# Patient Record
Sex: Female | Born: 1955 | Race: White | Hispanic: No | Smoking: Never smoker
Health system: Southern US, Community
[De-identification: ages and names within clinical notes are randomized; demographics above are authoritative.]

## PROBLEM LIST (undated history)

## (undated) DIAGNOSIS — F419 Anxiety disorder, unspecified: Secondary | ICD-10-CM

## (undated) DIAGNOSIS — Z950 Presence of cardiac pacemaker: Secondary | ICD-10-CM

## (undated) DIAGNOSIS — M1990 Unspecified osteoarthritis, unspecified site: Secondary | ICD-10-CM

## (undated) DIAGNOSIS — K219 Gastro-esophageal reflux disease without esophagitis: Secondary | ICD-10-CM

## (undated) DIAGNOSIS — F32A Depression, unspecified: Secondary | ICD-10-CM

## (undated) DIAGNOSIS — I5022 Chronic systolic (congestive) heart failure: Secondary | ICD-10-CM

## (undated) DIAGNOSIS — E119 Type 2 diabetes mellitus without complications: Secondary | ICD-10-CM

## (undated) DIAGNOSIS — T7840XA Allergy, unspecified, initial encounter: Secondary | ICD-10-CM

## (undated) DIAGNOSIS — R9431 Abnormal electrocardiogram [ECG] [EKG]: Secondary | ICD-10-CM

## (undated) DIAGNOSIS — G5760 Lesion of plantar nerve, unspecified lower limb: Secondary | ICD-10-CM

## (undated) DIAGNOSIS — R55 Syncope and collapse: Secondary | ICD-10-CM

## (undated) DIAGNOSIS — I10 Essential (primary) hypertension: Secondary | ICD-10-CM

## (undated) DIAGNOSIS — G44219 Episodic tension-type headache, not intractable: Secondary | ICD-10-CM

## (undated) DIAGNOSIS — Z8679 Personal history of other diseases of the circulatory system: Secondary | ICD-10-CM

## (undated) DIAGNOSIS — M797 Fibromyalgia: Secondary | ICD-10-CM

## (undated) DIAGNOSIS — E7800 Pure hypercholesterolemia, unspecified: Secondary | ICD-10-CM

## (undated) DIAGNOSIS — Q249 Congenital malformation of heart, unspecified: Secondary | ICD-10-CM

## (undated) DIAGNOSIS — E785 Hyperlipidemia, unspecified: Secondary | ICD-10-CM

## (undated) DIAGNOSIS — Z9889 Other specified postprocedural states: Secondary | ICD-10-CM

## (undated) DIAGNOSIS — G43909 Migraine, unspecified, not intractable, without status migrainosus: Secondary | ICD-10-CM

## (undated) DIAGNOSIS — F329 Major depressive disorder, single episode, unspecified: Secondary | ICD-10-CM

## (undated) DIAGNOSIS — B019 Varicella without complication: Secondary | ICD-10-CM

## (undated) HISTORY — DX: Type 2 diabetes mellitus without complications: E11.9

## (undated) HISTORY — DX: Depression, unspecified: F32.A

## (undated) HISTORY — DX: Gastro-esophageal reflux disease without esophagitis: K21.9

## (undated) HISTORY — DX: Hyperlipidemia, unspecified: E78.5

## (undated) HISTORY — DX: Pure hypercholesterolemia, unspecified: E78.00

## (undated) HISTORY — DX: Unspecified osteoarthritis, unspecified site: M19.90

## (undated) HISTORY — DX: Episodic tension-type headache, not intractable: G44.219

## (undated) HISTORY — DX: Allergy, unspecified, initial encounter: T78.40XA

## (undated) HISTORY — DX: Congenital malformation of heart, unspecified: Q24.9

## (undated) HISTORY — DX: Presence of cardiac pacemaker: Z95.0

## (undated) HISTORY — DX: Fibromyalgia: M79.7

## (undated) HISTORY — DX: Anxiety disorder, unspecified: F41.9

## (undated) HISTORY — DX: Varicella without complication: B01.9

## (undated) HISTORY — DX: Abnormal electrocardiogram (ECG) (EKG): R94.31

## (undated) HISTORY — DX: Personal history of other diseases of the circulatory system: Z86.79

## (undated) HISTORY — DX: Essential (primary) hypertension: I10

## (undated) HISTORY — DX: Lesion of plantar nerve, unspecified lower limb: G57.60

## (undated) HISTORY — DX: Migraine, unspecified, not intractable, without status migrainosus: G43.909

## (undated) HISTORY — DX: Other specified postprocedural states: Z98.890

## (undated) HISTORY — DX: Major depressive disorder, single episode, unspecified: F32.9

---
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
## 2003-04-22 HISTORY — PX: HYSTEROSCOPY: SHX211

## 2003-11-28 ENCOUNTER — Other Ambulatory Visit: Payer: Self-pay

## 2005-03-06 ENCOUNTER — Ambulatory Visit: Payer: Self-pay | Admitting: Unknown Physician Specialty

## 2005-03-18 ENCOUNTER — Ambulatory Visit: Payer: Self-pay | Admitting: Unknown Physician Specialty

## 2006-06-23 ENCOUNTER — Ambulatory Visit: Payer: Self-pay | Admitting: Unknown Physician Specialty

## 2006-07-15 ENCOUNTER — Emergency Department (HOSPITAL_COMMUNITY): Admission: EM | Admit: 2006-07-15 | Discharge: 2006-07-15 | Payer: Self-pay | Admitting: Emergency Medicine

## 2006-07-19 ENCOUNTER — Emergency Department (HOSPITAL_COMMUNITY): Admission: EM | Admit: 2006-07-19 | Discharge: 2006-07-19 | Payer: Self-pay | Admitting: Emergency Medicine

## 2006-09-22 ENCOUNTER — Ambulatory Visit (HOSPITAL_BASED_OUTPATIENT_CLINIC_OR_DEPARTMENT_OTHER): Admission: RE | Admit: 2006-09-22 | Discharge: 2006-09-22 | Payer: Self-pay | Admitting: Orthopaedic Surgery

## 2007-03-22 HISTORY — PX: SPINAL FUSION: SHX223

## 2008-01-31 ENCOUNTER — Ambulatory Visit: Payer: Self-pay | Admitting: Unknown Physician Specialty

## 2008-02-07 ENCOUNTER — Ambulatory Visit: Payer: Self-pay | Admitting: Unknown Physician Specialty

## 2009-01-19 ENCOUNTER — Ambulatory Visit: Payer: Self-pay | Admitting: Internal Medicine

## 2009-02-15 ENCOUNTER — Ambulatory Visit: Payer: Self-pay | Admitting: Unknown Physician Specialty

## 2009-02-19 ENCOUNTER — Ambulatory Visit: Payer: Self-pay | Admitting: Neurosurgery

## 2009-03-01 ENCOUNTER — Encounter: Admission: RE | Admit: 2009-03-01 | Discharge: 2009-04-03 | Payer: Self-pay | Admitting: Neurosurgery

## 2009-10-17 ENCOUNTER — Ambulatory Visit: Payer: Self-pay | Admitting: Pain Medicine

## 2009-10-31 ENCOUNTER — Ambulatory Visit: Payer: Self-pay | Admitting: Pain Medicine

## 2009-11-07 ENCOUNTER — Ambulatory Visit: Payer: Self-pay | Admitting: Pain Medicine

## 2009-11-14 ENCOUNTER — Ambulatory Visit: Payer: Self-pay | Admitting: Pain Medicine

## 2009-12-06 ENCOUNTER — Ambulatory Visit: Payer: Self-pay | Admitting: Pain Medicine

## 2009-12-12 ENCOUNTER — Ambulatory Visit: Payer: Self-pay | Admitting: Pain Medicine

## 2010-01-15 ENCOUNTER — Ambulatory Visit: Payer: Self-pay | Admitting: Pain Medicine

## 2010-01-23 ENCOUNTER — Ambulatory Visit: Payer: Self-pay | Admitting: Pain Medicine

## 2010-02-06 ENCOUNTER — Ambulatory Visit: Payer: Self-pay | Admitting: Pain Medicine

## 2010-02-27 ENCOUNTER — Ambulatory Visit: Payer: Self-pay | Admitting: Unknown Physician Specialty

## 2010-03-28 ENCOUNTER — Ambulatory Visit: Payer: Self-pay | Admitting: Pain Medicine

## 2010-05-14 ENCOUNTER — Ambulatory Visit: Payer: Self-pay | Admitting: Pain Medicine

## 2010-05-22 ENCOUNTER — Ambulatory Visit: Payer: Self-pay | Admitting: Pain Medicine

## 2010-06-11 ENCOUNTER — Ambulatory Visit: Payer: Self-pay | Admitting: Internal Medicine

## 2010-06-13 ENCOUNTER — Ambulatory Visit: Payer: Self-pay | Admitting: Pain Medicine

## 2010-07-01 ENCOUNTER — Ambulatory Visit: Payer: Self-pay | Admitting: Pain Medicine

## 2010-07-08 ENCOUNTER — Ambulatory Visit: Payer: Self-pay | Admitting: Pain Medicine

## 2010-08-06 ENCOUNTER — Ambulatory Visit: Payer: Self-pay | Admitting: Pain Medicine

## 2010-08-21 ENCOUNTER — Ambulatory Visit: Payer: Self-pay | Admitting: Pain Medicine

## 2010-09-03 NOTE — Op Note (Signed)
NAME:  Julia Pierce, Julia Pierce NO.:  [id number]   MEDICAL RECORD NO.:  [id number]          PATIENT TYPE:  AMB   LOCATION:  DSC                          FACILITY:  [facility]   PHYSICIAN:  Sharolyn Douglas, M.D.        DATE OF BIRTH:  1955/11/15   DATE OF PROCEDURE:  09/22/2006  DATE OF DISCHARGE:                               OPERATIVE REPORT   DIAGNOSES:  1. Lumbar spondylosis  2. Lumbar degenerative disk disease.  3. Back and bilateral lower extremity pain left greater than right.   PROCEDURE:  1. Bilateral L5-S1 facet joint injections.  2. Left L5-S1 transforaminal epidural steroid injection.  3. Fluoroscopic imaging used for needle placement of the above      injections.   SURGEON:  Sharolyn Douglas, M.D.   ASSISTANT:  None.   ANESTHESIA:  MAC plus local.   COMPLICATIONS:  None.   ESTIMATED BLOOD LOSS:  None.   INDICATIONS:  The patient is a pleasant 55 year old female with  persistent back and bilateral lower extremity pain.  This is thought to  possibly be related to lumbar spondylosis at L5-S1.  She now presents  for diagnostic and potentially therapeutic injections as listed above.  Risk, benefits and alternatives were reviewed.   PROCEDURE:  After informed consent she was taken to the operating room.  She was turned prone.  She underwent sedation by Anesthesia.  Back  prepped and draped in usual sterile fashion.  Fluoroscopy brought into  the field and fluoroscopic imaging was used to visualize the L5-S1 facet  joints bilaterally.  22 gauge Quincke spinal needles were advanced into  the facet joints bilaterally at L5-S1.  Aspiration showed no blood.  40  mg of Depo-Medrol and 1 cc preservative-free 1% lidocaine injected into  each facet joint bilaterally at L5-S1.   We then turned our attention to performing a left L5-S1 transforaminal  epidural steroid injection.  Oblique imaging was obtained of the L5-S1  segment.  22 gauge Quincke spinal needle was advanced in a  posterior  oblique fashion underneath the left L5 pedicle.  Aspiration showed no  blood or CSF.  Injection of 1 cc Omnipaque showed epidural  spread.  We then injected a solution of 80 mg Depo-Medrol and 4 cc 1%  preservative-free lidocaine.  The patient tolerated the procedure well.  There were no apparent complications.  Transferred to recovery in stable  condition.  I reviewed post injection instructions with her.  She will  follow-up in 2-3 weeks.      Sharolyn Douglas, M.D.  Electronically Signed     MC/MEDQ  D:  09/22/2006  T:  09/22/2006  Job:  [id number]

## 2010-09-05 ENCOUNTER — Ambulatory Visit: Payer: Self-pay | Admitting: Pain Medicine

## 2010-10-15 ENCOUNTER — Ambulatory Visit: Payer: Self-pay | Admitting: Pain Medicine

## 2010-11-12 ENCOUNTER — Ambulatory Visit: Payer: Self-pay | Admitting: Pain Medicine

## 2010-11-20 DIAGNOSIS — R9431 Abnormal electrocardiogram [ECG] [EKG]: Secondary | ICD-10-CM

## 2010-11-20 HISTORY — DX: Abnormal electrocardiogram (ECG) (EKG): R94.31

## 2010-12-10 ENCOUNTER — Ambulatory Visit: Payer: Self-pay | Admitting: Pain Medicine

## 2010-12-21 DIAGNOSIS — Z9889 Other specified postprocedural states: Secondary | ICD-10-CM

## 2010-12-21 HISTORY — DX: Other specified postprocedural states: Z98.890

## 2010-12-31 LAB — HM COLONOSCOPY: HM Colonoscopy: NORMAL

## 2011-01-07 ENCOUNTER — Ambulatory Visit: Payer: Self-pay | Admitting: Pain Medicine

## 2011-01-09 ENCOUNTER — Ambulatory Visit: Payer: Self-pay | Admitting: Gastroenterology

## 2011-01-11 ENCOUNTER — Other Ambulatory Visit: Payer: Self-pay | Admitting: Internal Medicine

## 2011-01-22 ENCOUNTER — Ambulatory Visit: Payer: Self-pay | Admitting: Pain Medicine

## 2011-01-30 LAB — HM MAMMOGRAPHY: HM Mammogram: NORMAL

## 2011-01-30 LAB — HM PAP SMEAR: HM Pap smear: NORMAL

## 2011-02-05 ENCOUNTER — Encounter: Payer: Self-pay | Admitting: Internal Medicine

## 2011-02-05 ENCOUNTER — Ambulatory Visit (INDEPENDENT_AMBULATORY_CARE_PROVIDER_SITE_OTHER): Payer: BC Managed Care – PPO | Admitting: Internal Medicine

## 2011-02-05 VITALS — BP 126/70 | HR 60 | Temp 97.9°F | Resp 12 | Ht 65.0 in | Wt 174.0 lb

## 2011-02-05 DIAGNOSIS — I10 Essential (primary) hypertension: Secondary | ICD-10-CM | POA: Insufficient documentation

## 2011-02-05 DIAGNOSIS — I4581 Long QT syndrome: Secondary | ICD-10-CM | POA: Insufficient documentation

## 2011-02-05 DIAGNOSIS — I499 Cardiac arrhythmia, unspecified: Secondary | ICD-10-CM

## 2011-02-05 DIAGNOSIS — F32A Depression, unspecified: Secondary | ICD-10-CM | POA: Insufficient documentation

## 2011-02-05 DIAGNOSIS — F329 Major depressive disorder, single episode, unspecified: Secondary | ICD-10-CM | POA: Insufficient documentation

## 2011-02-05 DIAGNOSIS — R9431 Abnormal electrocardiogram [ECG] [EKG]: Secondary | ICD-10-CM | POA: Insufficient documentation

## 2011-02-05 MED ORDER — METOPROLOL TARTRATE 50 MG PO TABS: 50.0000 mg | ORAL_TABLET | Freq: Two times a day (BID) | ORAL | Status: DC

## 2011-02-05 NOTE — Assessment & Plan Note (Signed)
Will schedule for sleep study per Dr. Laurena Bering .  continue metoprolol.

## 2011-02-05 NOTE — Progress Notes (Signed)
  Subjective:    Patient ID: Julia Pierce, female    DOB: 12/01/1955, 55 y.o.   MRN: [id number]  HPI 55 yo white female with history of hypertension, overweight, chronic neck and back pain receiving [facility]  Presents for followup on hypertension,  and hyperlipidemia. She recently underwent evaluation for long QT syndrome by Dr. Gwen Pounds with a Holter monitor and has been told she needs a sleep study. Past Medical History  Diagnosis Date  . Arthritis   . Chicken pox   . Headache, frequent episodic tension-type   . Cardiac arrhythmia due to congenital heart disease   . History of high blood pressure     readings  . High cholesterol   . Migraines   . Hx of colonoscopy sept 2012    normal,  next due 2022, [name]  . Hypertension   . Depression   . Holter monitor, abnormal August 2012    done for long QT.  some contractile asynchrony Gwen Pounds)   Current Outpatient Prescriptions on File Prior to Visit  Medication Sig Dispense Refill  . losartan-hydrochlorothiazide (HYZAAR) 100-25 MG per tablet TAKE 1 TABLET BY MOUTH DAILY  90 tablet  2  . methocarbamol (ROBAXIN) 500 MG tablet TAKE 1 TABLET BY MOUTH TWICE DAILY AS NEEDED FOR SPASM  180 tablet  2    Review of Systems  Constitutional: Negative for fever, chills and unexpected weight change.  HENT: Negative for hearing loss, ear pain, nosebleeds, congestion, sore throat, facial swelling, rhinorrhea, sneezing, mouth sores, trouble swallowing, neck pain, neck stiffness, voice change, postnasal drip, sinus pressure, tinnitus and ear discharge.   Eyes: Negative for pain, discharge, redness and visual disturbance.  Respiratory: Negative for cough, chest tightness, shortness of breath, wheezing and stridor.   Cardiovascular: Negative for chest pain, palpitations and leg swelling.  Musculoskeletal: Negative for myalgias and arthralgias.  Skin: Negative for color change and rash.  Neurological: Negative for  dizziness, weakness, light-headedness and headaches.  Hematological: Negative for adenopathy.       Objective:   Physical Exam  Constitutional: She is oriented to person, place, and time. She appears well-developed and well-nourished.  HENT:  Mouth/Throat: Oropharynx is clear and moist.  Eyes: EOM are normal. Pupils are equal, round, and reactive to light. No scleral icterus.  Neck: Normal range of motion. Neck supple. No JVD present. No thyromegaly present.  Cardiovascular: Normal rate, regular rhythm, normal heart sounds and intact distal pulses.   Pulmonary/Chest: Effort normal and breath sounds normal.  Abdominal: Soft. Bowel sounds are normal. She exhibits no mass. There is no tenderness.  Musculoskeletal: Normal range of motion. She exhibits no edema.  Lymphadenopathy:    She has no cervical adenopathy.  Neurological: She is alert and oriented to person, place, and time.  Skin: Skin is warm and dry.  Psychiatric: She has a normal mood and affect.          Assessment & Plan:

## 2011-02-05 NOTE — Assessment & Plan Note (Signed)
Currently controlled on metoprolol and losartan.

## 2011-02-06 ENCOUNTER — Ambulatory Visit: Payer: Self-pay | Admitting: Pain Medicine

## 2011-02-06 LAB — POCT HEMOGLOBIN-HEMACUE: Operator id: 123881

## 2011-02-20 ENCOUNTER — Encounter: Payer: Self-pay | Admitting: Internal Medicine

## 2011-02-25 ENCOUNTER — Ambulatory Visit: Payer: Self-pay | Admitting: Internal Medicine

## 2011-03-04 ENCOUNTER — Ambulatory Visit: Payer: Self-pay | Admitting: Pain Medicine

## 2011-03-04 ENCOUNTER — Ambulatory Visit: Payer: Self-pay | Admitting: Unknown Physician Specialty

## 2011-03-05 ENCOUNTER — Telehealth: Payer: Self-pay | Admitting: Internal Medicine

## 2011-03-05 NOTE — Telephone Encounter (Signed)
Notified patient of results 

## 2011-03-05 NOTE — Telephone Encounter (Signed)
Sleep study was negative for apnea,  But lots of snoring noted

## 2011-03-12 ENCOUNTER — Encounter: Payer: Self-pay | Admitting: Internal Medicine

## 2011-03-26 ENCOUNTER — Telehealth: Payer: Self-pay | Admitting: *Deleted

## 2011-03-26 ENCOUNTER — Other Ambulatory Visit (INDEPENDENT_AMBULATORY_CARE_PROVIDER_SITE_OTHER): Payer: BC Managed Care – PPO | Admitting: *Deleted

## 2011-03-26 DIAGNOSIS — E785 Hyperlipidemia, unspecified: Secondary | ICD-10-CM

## 2011-03-26 DIAGNOSIS — I10 Essential (primary) hypertension: Secondary | ICD-10-CM

## 2011-03-26 LAB — LIPID PANEL
HDL: 43.7 mg/dL (ref >39.00)
Total CHOL/HDL Ratio: 5
Triglycerides: 252 mg/dL — ABNORMAL HIGH (ref 0.0–149.0)

## 2011-03-26 LAB — COMPREHENSIVE METABOLIC PANEL
ALT: 16 U/L (ref 0–35)
Albumin: 3.9 g/dL (ref 3.5–5.2)
Alkaline Phosphatase: 64 U/L (ref 39–117)
Glucose, Bld: 111 mg/dL — ABNORMAL HIGH (ref 70–99)
Potassium: 4.1 mEq/L (ref 3.5–5.1)
Sodium: 140 mEq/L (ref 135–145)
Total Bilirubin: 0.3 mg/dL (ref 0.3–1.2)
Total Protein: 6.8 g/dL (ref 6.0–8.3)

## 2011-03-26 NOTE — Telephone Encounter (Signed)
Labs ordered.

## 2011-03-26 NOTE — Telephone Encounter (Signed)
Patient is on labs schedule for fasting labs. What labs would you like for me to order?

## 2011-03-26 NOTE — Telephone Encounter (Signed)
Fasting lipids,  CMEt,  Magnesium, TSH

## 2011-03-31 ENCOUNTER — Encounter: Payer: Self-pay | Admitting: Internal Medicine

## 2011-03-31 ENCOUNTER — Ambulatory Visit (INDEPENDENT_AMBULATORY_CARE_PROVIDER_SITE_OTHER): Payer: BC Managed Care – PPO | Admitting: Internal Medicine

## 2011-03-31 VITALS — BP 132/76 | HR 70 | Temp 98.2°F | Resp 16 | Ht 63.0 in | Wt 176.8 lb

## 2011-03-31 DIAGNOSIS — R739 Hyperglycemia, unspecified: Secondary | ICD-10-CM | POA: Insufficient documentation

## 2011-03-31 DIAGNOSIS — E663 Overweight: Secondary | ICD-10-CM

## 2011-03-31 DIAGNOSIS — R10811 Right upper quadrant abdominal tenderness: Secondary | ICD-10-CM

## 2011-03-31 DIAGNOSIS — F32A Depression, unspecified: Secondary | ICD-10-CM

## 2011-03-31 DIAGNOSIS — E785 Hyperlipidemia, unspecified: Secondary | ICD-10-CM

## 2011-03-31 DIAGNOSIS — R7309 Other abnormal glucose: Secondary | ICD-10-CM

## 2011-03-31 DIAGNOSIS — I429 Cardiomyopathy, unspecified: Secondary | ICD-10-CM | POA: Insufficient documentation

## 2011-03-31 DIAGNOSIS — I428 Other cardiomyopathies: Secondary | ICD-10-CM | POA: Insufficient documentation

## 2011-03-31 DIAGNOSIS — F329 Major depressive disorder, single episode, unspecified: Secondary | ICD-10-CM

## 2011-03-31 MED ORDER — SERTRALINE HCL 50 MG PO TABS: 100.0000 mg | ORAL_TABLET | Freq: Every day | ORAL | Status: DC

## 2011-03-31 NOTE — Assessment & Plan Note (Signed)
Spent 10 minutes discussing   lo glycemic index diet and need for exercise.  Return in 6 months with hga1c ordered.

## 2011-03-31 NOTE — Patient Instructions (Addendum)
Read about low glycemic index diets,  The Mediterranean Diet; these will both address the prediabetic stage and the high triglycerides  And subsequently ,  Your LDL.   I encourage you to eat 6 smaller deals daily,  And your goal should be 5 days of aerobic exercise 20 minutes per session.  Consider the Medifast Diet or going back to Weight watchers.  Losing 10% of your body weight will result in improved cholesterol profile.

## 2011-03-31 NOTE — Assessment & Plan Note (Signed)
She recently increased her dose to 75 mg daily and wonders if she could increase to 100 mg for persistent lack of motivation and anhedonia.  Recommended trial of 100 mg daily

## 2011-03-31 NOTE — Progress Notes (Signed)
Subjective:    Patient ID: Julia Pierce, female    DOB: September 30, 1955, 55 y.o.   MRN: [id number]  HPI  Julia Pierce is a 55 yr old white female with chronic neck and back pain , obesity and hyperlipidemia presents for 6 month followup.   She had sleep study done recently to rule out sleep apnea because of an abnormal ECHO showing systolic dysfunction EF 35%, mild mitral and tricuspid\f regurgitation  ,  labs done recently and her fasting glucose was elevated but not diagnostic of diabetes.   Past Medical History  Diagnosis Date  . Arthritis   . Chicken pox   . Headache, frequent episodic tension-type   . Cardiac arrhythmia due to congenital heart disease   . History of high blood pressure     readings  . High cholesterol   . Migraines   . Hx of colonoscopy sept 2012    normal,  next due 2022, [name]  . Hypertension   . Depression   . Holter monitor, abnormal August 2012    done for long QT.  some contractile asynchrony Julia Pierce)    Current Outpatient Prescriptions on File Prior to Visit  Medication Sig Dispense Refill  . calcium carbonate (OS-CAL) 600 MG TABS Take 600 mg by mouth daily.        . diphenhydrAMINE (BENADRYL) 25 MG tablet Take 25 mg by mouth at bedtime as needed.        . fish oil-omega-3 fatty acids 1000 MG capsule Take 2 g by mouth daily.        . fluticasone (FLONASE) 50 MCG/ACT nasal spray Place 2 sprays into the nose daily.        Marland Kitchen HYDROcodone-acetaminophen (NORCO) 5-325 MG per tablet Take 1 tablet by mouth every 6 (six) hours as needed.        Marland Kitchen ketoconazole (NIZORAL) 2 % cream Apply 1 application topically daily.        Marland Kitchen ketoconazole (NIZORAL) 2 % shampoo Apply 1 application topically 2 (two) times a week.        . losartan-hydrochlorothiazide (HYZAAR) 100-25 MG per tablet TAKE 1 TABLET BY MOUTH DAILY  90 tablet  2  . methocarbamol (ROBAXIN) 500 MG tablet TAKE 1 TABLET BY MOUTH TWICE DAILY AS NEEDED FOR SPASM  180 tablet  2  . metoprolol (LOPRESSOR) 50 MG tablet  Take 1 tablet (50 mg total) by mouth 2 (two) times daily.  180 tablet  3  . metroNIDAZOLE (METROGEL) 0.75 % gel Apply 1 application topically daily.        . Multiple Vitamin (MULTIVITAMIN) tablet Take 1 tablet by mouth daily.           Review of Systems  Constitutional: Negative for fever, chills and unexpected weight change.  HENT: Negative for hearing loss, ear pain, nosebleeds, congestion, sore throat, facial swelling, rhinorrhea, sneezing, mouth sores, trouble swallowing, neck pain, neck stiffness, voice change, postnasal drip, sinus pressure, tinnitus and ear discharge.   Eyes: Negative for pain, discharge, redness and visual disturbance.  Respiratory: Negative for cough, chest tightness, shortness of breath, wheezing and stridor.   Cardiovascular: Negative for chest pain, palpitations and leg swelling.  Musculoskeletal: Negative for myalgias and arthralgias.  Skin: Negative for color change and rash.  Neurological: Negative for dizziness, weakness, light-headedness and headaches.  Hematological: Negative for adenopathy.      BP 132/76  Pulse 70  Temp(Src) 98.2 F (36.8 C) (Oral)  Resp 16  Ht 5\' 3"  (1.6 m)  Wt 176 lb 12 oz (80.173 kg)  BMI 31.31 kg/m2  SpO2 98%  Objective:   Physical Exam  Constitutional: She is oriented to person, place, and time. She appears well-developed and well-nourished.  HENT:  Mouth/Throat: Oropharynx is clear and moist.  Eyes: EOM are normal. Pupils are equal, round, and reactive to light. No scleral icterus.  Neck: Normal range of motion. Neck supple. No JVD present. No thyromegaly present.  Cardiovascular: Normal rate, regular rhythm, normal heart sounds and intact distal pulses.   Pulmonary/Chest: Effort normal and breath sounds normal.  Abdominal: Soft. Bowel sounds are normal. She exhibits no mass. There is no tenderness.  Musculoskeletal: Normal range of motion. She exhibits no edema.  Lymphadenopathy:    She has no cervical adenopathy.    Neurological: She is alert and oriented to person, place, and time.  Skin: Skin is warm and dry.  Psychiatric: She has a normal mood and affect.      Assessment & Plan:   Valvular cardiomyopathy By recent ECHO, EF 35%.  Mild MR and TR. She is well compensated.  Recommended a walking program to increase endurance and prevent additional weight gain given concurrent obeisty and prediabetes.  Sleep apnea ruled out as cause by recent sleep study,  continue current medications  Hyperglycemia Spent 10 minutes discussing   lo glycemic index diet and need for exercise.  Return in 6 months with hga1c ordered.   Depression She recently increased her dose to 75 mg daily and wonders if she could increase to 100 mg for persistent lack of motivation and anhedonia.  Recommended trial of 100 mg daily  Hyperlipidemia LDL goal < 100 Discussed goals of therapy.  She would like to try low glycemic diet to lower her trigs and LDl.  Repeat in 6 months     Updated Medication List Outpatient Encounter Prescriptions as of 03/31/2011  Medication Sig Dispense Refill  . calcium carbonate (OS-CAL) 600 MG TABS Take 600 mg by mouth daily.        . diphenhydrAMINE (BENADRYL) 25 MG tablet Take 25 mg by mouth at bedtime as needed.        . fish oil-omega-3 fatty acids 1000 MG capsule Take 2 g by mouth daily.        . fluticasone (FLONASE) 50 MCG/ACT nasal spray Place 2 sprays into the nose daily.        Marland Kitchen HYDROcodone-acetaminophen (NORCO) 5-325 MG per tablet Take 1 tablet by mouth every 6 (six) hours as needed.        Marland Kitchen ketoconazole (NIZORAL) 2 % cream Apply 1 application topically daily.        Marland Kitchen ketoconazole (NIZORAL) 2 % shampoo Apply 1 application topically 2 (two) times a week.        . losartan-hydrochlorothiazide (HYZAAR) 100-25 MG per tablet TAKE 1 TABLET BY MOUTH DAILY  90 tablet  2  . methocarbamol (ROBAXIN) 500 MG tablet TAKE 1 TABLET BY MOUTH TWICE DAILY AS NEEDED FOR SPASM  180 tablet  2  . metoprolol  (LOPRESSOR) 50 MG tablet Take 1 tablet (50 mg total) by mouth 2 (two) times daily.  180 tablet  3  . metroNIDAZOLE (METROGEL) 0.75 % gel Apply 1 application topically daily.        . Multiple Vitamin (MULTIVITAMIN) tablet Take 1 tablet by mouth daily.        . sertraline (ZOLOFT) 50 MG tablet Take 2 tablets (100 mg total) by mouth daily. Take one and a half  tablet daily (75 mg)  60 tablet  3  . DISCONTD: sertraline (ZOLOFT) 50 MG tablet Take 50 mg by mouth daily. Take one and a half tablet daily (75 mg)       . DISCONTD: SERTRALINE HCL PO Take by mouth.

## 2011-03-31 NOTE — Assessment & Plan Note (Signed)
By recent ECHO, EF 35%.  Mild MR and TR. She is well compensated.  Recommended a walking program to increase endurance and prevent additional weight gain given concurrent obeisty and prediabetes.  Sleep apnea ruled out as cause by recent sleep study,  continue current medications

## 2011-03-31 NOTE — Assessment & Plan Note (Signed)
Discussed goals of therapy.  She would like to try low glycemic diet to lower her trigs and LDl.  Repeat in 6 months

## 2011-04-03 ENCOUNTER — Ambulatory Visit: Payer: Self-pay | Admitting: Pain Medicine

## 2011-04-10 ENCOUNTER — Telehealth: Payer: Self-pay | Admitting: Internal Medicine

## 2011-04-10 ENCOUNTER — Ambulatory Visit: Payer: Self-pay | Admitting: Internal Medicine

## 2011-04-10 NOTE — Telephone Encounter (Signed)
Left message to call back  

## 2011-04-10 NOTE — Telephone Encounter (Signed)
Her abdominal ultrasound was normal.  No Gallstones and no GB or bile duct abnormalities

## 2011-04-10 NOTE — Telephone Encounter (Signed)
Pt aware of results 

## 2011-05-05 ENCOUNTER — Encounter: Payer: Self-pay | Admitting: Internal Medicine

## 2011-05-06 ENCOUNTER — Ambulatory Visit: Payer: Self-pay | Admitting: Pain Medicine

## 2011-05-06 DIAGNOSIS — I10 Essential (primary) hypertension: Secondary | ICD-10-CM | POA: Diagnosis not present

## 2011-05-06 DIAGNOSIS — M539 Dorsopathy, unspecified: Secondary | ICD-10-CM | POA: Diagnosis not present

## 2011-05-06 DIAGNOSIS — IMO0001 Reserved for inherently not codable concepts without codable children: Secondary | ICD-10-CM | POA: Diagnosis not present

## 2011-05-06 DIAGNOSIS — M5030 Other cervical disc degeneration, unspecified cervical region: Secondary | ICD-10-CM | POA: Diagnosis not present

## 2011-05-06 DIAGNOSIS — M79609 Pain in unspecified limb: Secondary | ICD-10-CM | POA: Diagnosis not present

## 2011-05-06 DIAGNOSIS — Z79899 Other long term (current) drug therapy: Secondary | ICD-10-CM | POA: Diagnosis not present

## 2011-05-06 DIAGNOSIS — M545 Low back pain, unspecified: Secondary | ICD-10-CM | POA: Diagnosis not present

## 2011-05-06 DIAGNOSIS — M542 Cervicalgia: Secondary | ICD-10-CM | POA: Diagnosis not present

## 2011-05-06 DIAGNOSIS — E119 Type 2 diabetes mellitus without complications: Secondary | ICD-10-CM | POA: Diagnosis not present

## 2011-05-06 DIAGNOSIS — M5126 Other intervertebral disc displacement, lumbar region: Secondary | ICD-10-CM | POA: Diagnosis not present

## 2011-05-06 DIAGNOSIS — M5020 Other cervical disc displacement, unspecified cervical region: Secondary | ICD-10-CM | POA: Diagnosis not present

## 2011-05-06 DIAGNOSIS — Z9889 Other specified postprocedural states: Secondary | ICD-10-CM | POA: Diagnosis not present

## 2011-05-12 ENCOUNTER — Encounter: Payer: Self-pay | Admitting: Internal Medicine

## 2011-05-12 DIAGNOSIS — E669 Obesity, unspecified: Secondary | ICD-10-CM | POA: Insufficient documentation

## 2011-05-12 DIAGNOSIS — G5760 Lesion of plantar nerve, unspecified lower limb: Secondary | ICD-10-CM | POA: Insufficient documentation

## 2011-06-03 ENCOUNTER — Other Ambulatory Visit: Payer: Self-pay | Admitting: *Deleted

## 2011-06-03 ENCOUNTER — Ambulatory Visit: Payer: Self-pay | Admitting: Pain Medicine

## 2011-06-03 DIAGNOSIS — Z79899 Other long term (current) drug therapy: Secondary | ICD-10-CM | POA: Diagnosis not present

## 2011-06-03 DIAGNOSIS — M545 Low back pain, unspecified: Secondary | ICD-10-CM | POA: Diagnosis not present

## 2011-06-03 DIAGNOSIS — E119 Type 2 diabetes mellitus without complications: Secondary | ICD-10-CM | POA: Diagnosis not present

## 2011-06-03 DIAGNOSIS — I10 Essential (primary) hypertension: Secondary | ICD-10-CM | POA: Diagnosis not present

## 2011-06-03 DIAGNOSIS — R209 Unspecified disturbances of skin sensation: Secondary | ICD-10-CM | POA: Diagnosis not present

## 2011-06-03 DIAGNOSIS — Z9889 Other specified postprocedural states: Secondary | ICD-10-CM | POA: Diagnosis not present

## 2011-06-03 DIAGNOSIS — M79609 Pain in unspecified limb: Secondary | ICD-10-CM | POA: Diagnosis not present

## 2011-06-03 DIAGNOSIS — M5020 Other cervical disc displacement, unspecified cervical region: Secondary | ICD-10-CM | POA: Diagnosis not present

## 2011-06-03 DIAGNOSIS — F329 Major depressive disorder, single episode, unspecified: Secondary | ICD-10-CM

## 2011-06-03 DIAGNOSIS — F32A Depression, unspecified: Secondary | ICD-10-CM

## 2011-06-03 DIAGNOSIS — M129 Arthropathy, unspecified: Secondary | ICD-10-CM | POA: Diagnosis not present

## 2011-06-03 DIAGNOSIS — M542 Cervicalgia: Secondary | ICD-10-CM | POA: Diagnosis not present

## 2011-06-03 DIAGNOSIS — M539 Dorsopathy, unspecified: Secondary | ICD-10-CM | POA: Diagnosis not present

## 2011-06-03 MED ORDER — SERTRALINE HCL 100 MG PO TABS: 100.0000 mg | ORAL_TABLET | Freq: Every day | ORAL | Status: DC

## 2011-06-03 MED ORDER — SERTRALINE HCL 50 MG PO TABS: 100.0000 mg | ORAL_TABLET | Freq: Every day | ORAL | Status: DC

## 2011-06-03 NOTE — Telephone Encounter (Signed)
Advised patient scripts are ready for pick up.  [facility], that script cancelled via [facility].

## 2011-06-03 NOTE — Telephone Encounter (Signed)
Pt is asking for a written 90 day script for mailorder for sertraline 100 mg's and a written 30 day script to take to a local pharmacy.  She says she has been taking two 50 mg's a day and is asking to have this changed to one 100 mg a day.  She will pick up when ready.

## 2011-06-03 NOTE — Telephone Encounter (Signed)
Both should be on printer

## 2011-06-18 DIAGNOSIS — Z872 Personal history of diseases of the skin and subcutaneous tissue: Secondary | ICD-10-CM | POA: Diagnosis not present

## 2011-06-18 DIAGNOSIS — L719 Rosacea, unspecified: Secondary | ICD-10-CM | POA: Diagnosis not present

## 2011-06-18 DIAGNOSIS — D239 Other benign neoplasm of skin, unspecified: Secondary | ICD-10-CM | POA: Diagnosis not present

## 2011-06-18 DIAGNOSIS — D485 Neoplasm of uncertain behavior of skin: Secondary | ICD-10-CM | POA: Diagnosis not present

## 2011-06-24 ENCOUNTER — Encounter: Payer: Self-pay | Admitting: Internal Medicine

## 2011-07-09 ENCOUNTER — Ambulatory Visit: Payer: Self-pay | Admitting: Pain Medicine

## 2011-07-09 DIAGNOSIS — M62838 Other muscle spasm: Secondary | ICD-10-CM | POA: Diagnosis not present

## 2011-07-09 DIAGNOSIS — M542 Cervicalgia: Secondary | ICD-10-CM | POA: Diagnosis not present

## 2011-07-09 DIAGNOSIS — M5137 Other intervertebral disc degeneration, lumbosacral region: Secondary | ICD-10-CM | POA: Diagnosis not present

## 2011-07-09 DIAGNOSIS — M79609 Pain in unspecified limb: Secondary | ICD-10-CM | POA: Diagnosis not present

## 2011-07-09 DIAGNOSIS — R7309 Other abnormal glucose: Secondary | ICD-10-CM | POA: Diagnosis not present

## 2011-07-09 DIAGNOSIS — M539 Dorsopathy, unspecified: Secondary | ICD-10-CM | POA: Diagnosis not present

## 2011-07-09 DIAGNOSIS — M5020 Other cervical disc displacement, unspecified cervical region: Secondary | ICD-10-CM | POA: Diagnosis not present

## 2011-07-09 DIAGNOSIS — Z79899 Other long term (current) drug therapy: Secondary | ICD-10-CM | POA: Diagnosis not present

## 2011-07-09 DIAGNOSIS — I10 Essential (primary) hypertension: Secondary | ICD-10-CM | POA: Diagnosis not present

## 2011-07-09 DIAGNOSIS — M549 Dorsalgia, unspecified: Secondary | ICD-10-CM | POA: Diagnosis not present

## 2011-07-09 DIAGNOSIS — Z9889 Other specified postprocedural states: Secondary | ICD-10-CM | POA: Diagnosis not present

## 2011-07-09 DIAGNOSIS — M5030 Other cervical disc degeneration, unspecified cervical region: Secondary | ICD-10-CM | POA: Diagnosis not present

## 2011-08-07 ENCOUNTER — Ambulatory Visit: Payer: Self-pay | Admitting: Pain Medicine

## 2011-08-07 DIAGNOSIS — M5020 Other cervical disc displacement, unspecified cervical region: Secondary | ICD-10-CM | POA: Diagnosis not present

## 2011-08-07 DIAGNOSIS — R51 Headache: Secondary | ICD-10-CM | POA: Diagnosis not present

## 2011-08-07 DIAGNOSIS — I10 Essential (primary) hypertension: Secondary | ICD-10-CM | POA: Diagnosis not present

## 2011-08-07 DIAGNOSIS — M79609 Pain in unspecified limb: Secondary | ICD-10-CM | POA: Diagnosis not present

## 2011-08-07 DIAGNOSIS — M545 Low back pain, unspecified: Secondary | ICD-10-CM | POA: Diagnosis not present

## 2011-08-07 DIAGNOSIS — R7309 Other abnormal glucose: Secondary | ICD-10-CM | POA: Diagnosis not present

## 2011-08-07 DIAGNOSIS — Z9889 Other specified postprocedural states: Secondary | ICD-10-CM | POA: Diagnosis not present

## 2011-08-07 DIAGNOSIS — Z79899 Other long term (current) drug therapy: Secondary | ICD-10-CM | POA: Diagnosis not present

## 2011-08-07 DIAGNOSIS — M542 Cervicalgia: Secondary | ICD-10-CM | POA: Diagnosis not present

## 2011-08-07 DIAGNOSIS — M129 Arthropathy, unspecified: Secondary | ICD-10-CM | POA: Diagnosis not present

## 2011-08-07 DIAGNOSIS — M5137 Other intervertebral disc degeneration, lumbosacral region: Secondary | ICD-10-CM | POA: Diagnosis not present

## 2011-08-07 DIAGNOSIS — M5030 Other cervical disc degeneration, unspecified cervical region: Secondary | ICD-10-CM | POA: Diagnosis not present

## 2011-08-07 DIAGNOSIS — M546 Pain in thoracic spine: Secondary | ICD-10-CM | POA: Diagnosis not present

## 2011-08-07 DIAGNOSIS — M539 Dorsopathy, unspecified: Secondary | ICD-10-CM | POA: Diagnosis not present

## 2011-08-07 DIAGNOSIS — M5126 Other intervertebral disc displacement, lumbar region: Secondary | ICD-10-CM | POA: Diagnosis not present

## 2011-09-04 ENCOUNTER — Ambulatory Visit: Payer: Self-pay | Admitting: Pain Medicine

## 2011-09-04 DIAGNOSIS — M545 Low back pain, unspecified: Secondary | ICD-10-CM | POA: Diagnosis not present

## 2011-09-04 DIAGNOSIS — M47817 Spondylosis without myelopathy or radiculopathy, lumbosacral region: Secondary | ICD-10-CM | POA: Diagnosis not present

## 2011-09-04 DIAGNOSIS — Z79899 Other long term (current) drug therapy: Secondary | ICD-10-CM | POA: Diagnosis not present

## 2011-09-04 DIAGNOSIS — M79609 Pain in unspecified limb: Secondary | ICD-10-CM | POA: Diagnosis not present

## 2011-09-04 DIAGNOSIS — M542 Cervicalgia: Secondary | ICD-10-CM | POA: Diagnosis not present

## 2011-09-04 DIAGNOSIS — M47812 Spondylosis without myelopathy or radiculopathy, cervical region: Secondary | ICD-10-CM | POA: Diagnosis not present

## 2011-09-04 DIAGNOSIS — M546 Pain in thoracic spine: Secondary | ICD-10-CM | POA: Diagnosis not present

## 2011-09-04 DIAGNOSIS — R51 Headache: Secondary | ICD-10-CM | POA: Diagnosis not present

## 2011-09-08 DIAGNOSIS — E782 Mixed hyperlipidemia: Secondary | ICD-10-CM | POA: Diagnosis not present

## 2011-09-08 DIAGNOSIS — I2510 Atherosclerotic heart disease of native coronary artery without angina pectoris: Secondary | ICD-10-CM | POA: Diagnosis not present

## 2011-09-08 DIAGNOSIS — I059 Rheumatic mitral valve disease, unspecified: Secondary | ICD-10-CM | POA: Diagnosis not present

## 2011-09-08 DIAGNOSIS — I5022 Chronic systolic (congestive) heart failure: Secondary | ICD-10-CM | POA: Diagnosis not present

## 2011-09-22 ENCOUNTER — Other Ambulatory Visit (INDEPENDENT_AMBULATORY_CARE_PROVIDER_SITE_OTHER): Payer: BC Managed Care – PPO | Admitting: *Deleted

## 2011-09-22 DIAGNOSIS — E663 Overweight: Secondary | ICD-10-CM

## 2011-09-22 DIAGNOSIS — E785 Hyperlipidemia, unspecified: Secondary | ICD-10-CM

## 2011-09-22 LAB — COMPREHENSIVE METABOLIC PANEL
ALT: 17 U/L (ref 0–35)
AST: 20 U/L (ref 0–37)
Alkaline Phosphatase: 62 U/L (ref 39–117)
Creatinine, Ser: 0.6 mg/dL (ref 0.4–1.2)
GFR: 105.39 mL/min (ref >60.00)
Sodium: 140 mEq/L (ref 135–145)
Total Bilirubin: 0.3 mg/dL (ref 0.3–1.2)
Total Protein: 7.3 g/dL (ref 6.0–8.3)

## 2011-09-22 LAB — LIPID PANEL
Cholesterol: 207 mg/dL — ABNORMAL HIGH (ref 0–200)
HDL: 42.3 mg/dL (ref >39.00)
Total CHOL/HDL Ratio: 5
Triglycerides: 323 mg/dL — ABNORMAL HIGH (ref 0.0–149.0)
VLDL: 64.6 mg/dL — ABNORMAL HIGH (ref 0.0–40.0)

## 2011-09-24 ENCOUNTER — Encounter: Payer: Self-pay | Admitting: Internal Medicine

## 2011-09-24 ENCOUNTER — Ambulatory Visit (INDEPENDENT_AMBULATORY_CARE_PROVIDER_SITE_OTHER): Payer: BC Managed Care – PPO | Admitting: Internal Medicine

## 2011-09-24 VITALS — BP 122/80 | HR 67 | Temp 98.3°F | Resp 16 | Wt 175.0 lb

## 2011-09-24 DIAGNOSIS — E785 Hyperlipidemia, unspecified: Secondary | ICD-10-CM

## 2011-09-24 DIAGNOSIS — E119 Type 2 diabetes mellitus without complications: Secondary | ICD-10-CM | POA: Diagnosis not present

## 2011-09-24 DIAGNOSIS — I10 Essential (primary) hypertension: Secondary | ICD-10-CM

## 2011-09-24 DIAGNOSIS — F32A Depression, unspecified: Secondary | ICD-10-CM

## 2011-09-24 DIAGNOSIS — F3289 Other specified depressive episodes: Secondary | ICD-10-CM | POA: Diagnosis not present

## 2011-09-24 DIAGNOSIS — F329 Major depressive disorder, single episode, unspecified: Secondary | ICD-10-CM

## 2011-09-24 DIAGNOSIS — I499 Cardiac arrhythmia, unspecified: Secondary | ICD-10-CM

## 2011-09-24 DIAGNOSIS — E669 Obesity, unspecified: Secondary | ICD-10-CM

## 2011-09-24 MED ORDER — METOPROLOL TARTRATE 50 MG PO TABS: 50.0000 mg | ORAL_TABLET | Freq: Two times a day (BID) | ORAL | Status: DC

## 2011-09-24 MED ORDER — LOSARTAN POTASSIUM-HCTZ 100-25 MG PO TABS: 1.0000 | ORAL_TABLET | Freq: Every day | ORAL | Status: DC

## 2011-09-24 MED ORDER — SERTRALINE HCL 100 MG PO TABS: 100.0000 mg | ORAL_TABLET | Freq: Every day | ORAL | Status: DC

## 2011-09-24 NOTE — Patient Instructions (Signed)
Consider the Low Glycemic Index Diet and 6 smaller meals daily .  This boosts your metabolism, will lower your triglycerides and regulates your sugars:   7 AM Low carbohydrate Protein  Shakes (EAS Carb Control  Or Atkins ,  Available everywhere,   In  cases at [employer] )  2.5 carbs  (Add or substitute a toasted sandwhich thin w/ peanut butter)  10 AM: Protein bar by Atkins (snack size,  Chocolate lover's variety at  [employer])    Lunch: sandwich on pita bread or flatbread (Joseph's makes a pita bread and a flat bread , available at [employer] and [employer]; Toufayah makes a low carb flatbread available at [employer] and [employer]) Mission makes a low carb whole wheat tortilla available at [employer] most grocery stores   3 PM:  Mid day :  Another protein bar,  Or a  cheese stick, 1/4 cup of almonds, walnuts, pistachios, pecans, peanuts,  Macadamia nuts  6 PM  Dinner:  "mean and green:"  Meat/chicken/fish, salad, and green veggie : use ranch, vinagrette,  Blue cheese, etc  9 PM snack : Breyer's low carb fudgsicle or  ice cream bar (Carb Smart), or  Weight Watcher's ice cream bar , or another protein shake   ----------------------------------------------------------------------------------------------------------  We will repeat your triglycerides in 3 months (fasting lipid panel) and if they have not improved, we will consider medication to lower them

## 2011-09-24 NOTE — Progress Notes (Signed)
Patient ID: Julia Pierce, female   DOB: 1956/02/03, 56 y.o.   MRN: [id number] Patient Active Problem List  Diagnoses  . Hypertension  . Depression  . Holter monitor, abnormal  . Long QT syndrome  . Valvular cardiomyopathy  . Hyperlipidemia LDL goal < 100  . Morton's neuroma  . Obesity (BMI 30-39.9)  . Diabetes mellitus type 2, diet-controlled    Subjective:  CC:   Chief Complaint  Patient presents with  . Follow-up    HPI:   Julia Pierce a 56 y.o. female who presents Follow up on hypertension, hyperlipidemia and diet controlled diabetes> She had labs done recently. Her   hgba1c 6.0,  CMET nornal, but triglycerides are  up to 323 without therapy.  Has had several deaths in family over the last 3 months, , is still cleaning out father's home, and spent a month out of town, so she has not been following a low glycemic index diet for the last 2 months.  Prior to then she was swimming two mornings per week (when home).  She attributes her elevated trigs to stress.  She denies hyperphagia, nocturia, excessive thirst. She has chronic back pain which limits her ability to exercise regularly.    Past Medical History  Diagnosis Date  . Arthritis   . Chicken pox   . Headache, frequent episodic tension-type   . Cardiac arrhythmia due to congenital heart disease   . History of high blood pressure     readings  . High cholesterol   . Migraines   . Hx of colonoscopy sept 2012    normal,  next due 2022, [name]  . Hypertension   . Depression   . Holter monitor, abnormal August 2012    done for long QT.  some contractile asynchrony Gwen Pounds)  . Morton's neuroma     bilateral    Past Surgical History  Procedure Date  . Appendectomy 1973  . Tonsillectomy and adenoidectomy 1966  . Spinal fusion 03/2007    ruptured disc  L5  [name]  . Hysteroscopy 2005    heavy bleeding         The following portions of the patient's history were reviewed and updated as appropriate:  Allergies, current medications, and problem list.    Review of Systems:   12 Pt  review of systems was negative except those addressed in the HPI,     History   Social History  . Marital Status: Married    Spouse Name: N/A    Number of Children: N/A  . Years of Education: N/A   Occupational History  . Not on file.   Social History Main Topics  . Smoking status: Never Smoker   . Smokeless tobacco: Never Used  . Alcohol Use: No  . Drug Use: No  . Sexually Active: Not on file   Other Topics Concern  . Not on file   Social History Narrative  . No narrative on file    Objective:  BP 122/80  Pulse 67  Temp(Src) 98.3 F (36.8 C) (Oral)  Resp 16  Wt 175 lb (79.379 kg)  SpO2 96%  General appearance: alert, cooperative and appears stated age Ears: normal TM's and external ear canals both ears Throat: lips, mucosa, and tongue normal; teeth and gums normal Neck: no adenopathy, no carotid bruit, supple, symmetrical, trachea midline and thyroid not enlarged, symmetric, no tenderness/mass/nodules Back: symmetric, no curvature. ROM normal. No CVA tenderness. Lungs: clear to auscultation bilaterally Heart: regular rate  and rhythm, S1, S2 normal, no murmur, click, rub or gallop Abdomen: soft, non-tender; bowel sounds normal; no masses,  no organomegaly Pulses: 2+ and symmetric Skin: Skin color, texture, turgor normal. No rashes or lesions Lymph nodes: Cervical, supraclavicular, and axillary nodes normal.  Assessment and Plan:  Diabetes mellitus type 2, diet-controlled With recent a1c 6.0.  Discussed low glycemic index diet, regular eye exams,  Goal LDL of 70,  And need to lower triglcyerides. She will return in 3 months for repeat fasting labs and begin therapy if indicated.   Hyperlipidemia LDL goal < 100 Discussed goal LDL of 70.,  She will lmake a concerte deffort to lose weigh,  Exercise and follow low GI diet,.  Repeat in 3 months.    Hypertension Well controlled  on current medications.  No changes today.    Updated Medication List Outpatient Encounter Prescriptions as of 09/24/2011  Medication Sig Dispense Refill  . calcium carbonate (OS-CAL) 600 MG TABS Take 600 mg by mouth daily.        . diphenhydrAMINE (BENADRYL) 25 MG tablet Take 25 mg by mouth at bedtime as needed.        . fish oil-omega-3 fatty acids 1000 MG capsule Take 2 g by mouth daily.        . fluticasone (FLONASE) 50 MCG/ACT nasal spray Place 2 sprays into the nose daily.        Marland Kitchen HYDROcodone-acetaminophen (NORCO) 5-325 MG per tablet Take 1 tablet by mouth every 6 (six) hours as needed.        Marland Kitchen ketoconazole (NIZORAL) 2 % shampoo Apply 1 application topically 2 (two) times a week.        . losartan-hydrochlorothiazide (HYZAAR) 100-25 MG per tablet Take 1 tablet by mouth daily.  90 tablet  3  . methocarbamol (ROBAXIN) 500 MG tablet TAKE 1 TABLET BY MOUTH TWICE DAILY AS NEEDED FOR SPASM  180 tablet  2  . metoprolol (LOPRESSOR) 50 MG tablet Take 1 tablet (50 mg total) by mouth 2 (two) times daily.  180 tablet  3  . Multiple Vitamin (MULTIVITAMIN) tablet Take 1 tablet by mouth daily.        . sertraline (ZOLOFT) 100 MG tablet Take 1 tablet (100 mg total) by mouth daily.  90 tablet  3  . spironolactone (ALDACTONE) 25 MG tablet Take 25 mg by mouth daily.      Marland Kitchen DISCONTD: losartan-hydrochlorothiazide (HYZAAR) 100-25 MG per tablet TAKE 1 TABLET BY MOUTH DAILY  90 tablet  2  . DISCONTD: metoprolol (LOPRESSOR) 50 MG tablet Take 1 tablet (50 mg total) by mouth 2 (two) times daily.  180 tablet  3  . DISCONTD: sertraline (ZOLOFT) 50 MG tablet Take 2 tablets (100 mg total) by mouth daily. Take one and a half tablet daily (75 mg)  90 tablet  3  . DISCONTD: ketoconazole (NIZORAL) 2 % cream Apply 1 application topically daily.        Marland Kitchen DISCONTD: metroNIDAZOLE (METROGEL) 0.75 % gel Apply 1 application topically daily.           No orders of the defined types were placed in this encounter.    No  Follow-up on file.

## 2011-09-27 ENCOUNTER — Encounter: Payer: Self-pay | Admitting: Internal Medicine

## 2011-09-27 DIAGNOSIS — E119 Type 2 diabetes mellitus without complications: Secondary | ICD-10-CM | POA: Insufficient documentation

## 2011-09-27 DIAGNOSIS — E1169 Type 2 diabetes mellitus with other specified complication: Secondary | ICD-10-CM | POA: Insufficient documentation

## 2011-09-27 NOTE — Assessment & Plan Note (Signed)
With recent a1c 6.0.  Discussed low glycemic index diet, regular eye exams,  Goal LDL of 70,  And need to lower triglcyerides. She will return in 3 months for repeat fasting labs and begin therapy if indicated.

## 2011-09-27 NOTE — Assessment & Plan Note (Signed)
Well controlled on current medications.  No changes today. 

## 2011-09-27 NOTE — Assessment & Plan Note (Signed)
I have addressed  BMI and recommended a low glycemic index diet utilizing smaller more frequent meals to increase metabolism.  I have also recommended that patient start exercising with a goal of 30 minutes of aerobic exercise a minimum of 5 days per week.  

## 2011-09-27 NOTE — Assessment & Plan Note (Signed)
Discussed goal LDL of 70.,  She will lmake a concerte deffort to lose weigh,  Exercise and follow low GI diet,.  Repeat in 3 months.

## 2011-10-02 ENCOUNTER — Ambulatory Visit: Payer: BC Managed Care – PPO | Admitting: Internal Medicine

## 2011-10-14 ENCOUNTER — Ambulatory Visit: Payer: Self-pay | Admitting: Pain Medicine

## 2011-10-14 DIAGNOSIS — M545 Low back pain, unspecified: Secondary | ICD-10-CM | POA: Diagnosis not present

## 2011-10-14 DIAGNOSIS — Z79899 Other long term (current) drug therapy: Secondary | ICD-10-CM | POA: Diagnosis not present

## 2011-10-14 DIAGNOSIS — M47812 Spondylosis without myelopathy or radiculopathy, cervical region: Secondary | ICD-10-CM | POA: Diagnosis not present

## 2011-10-14 DIAGNOSIS — M47817 Spondylosis without myelopathy or radiculopathy, lumbosacral region: Secondary | ICD-10-CM | POA: Diagnosis not present

## 2011-10-14 DIAGNOSIS — M79609 Pain in unspecified limb: Secondary | ICD-10-CM | POA: Diagnosis not present

## 2011-10-14 DIAGNOSIS — R51 Headache: Secondary | ICD-10-CM | POA: Diagnosis not present

## 2011-10-14 DIAGNOSIS — M542 Cervicalgia: Secondary | ICD-10-CM | POA: Diagnosis not present

## 2011-11-04 LAB — HM DIABETES EYE EXAM: HM Diabetic Eye Exam: NORMAL

## 2011-11-11 ENCOUNTER — Ambulatory Visit: Payer: Self-pay | Admitting: Pain Medicine

## 2011-11-11 DIAGNOSIS — M79609 Pain in unspecified limb: Secondary | ICD-10-CM | POA: Diagnosis not present

## 2011-11-11 DIAGNOSIS — G8929 Other chronic pain: Secondary | ICD-10-CM | POA: Diagnosis not present

## 2011-11-11 DIAGNOSIS — M542 Cervicalgia: Secondary | ICD-10-CM | POA: Diagnosis not present

## 2011-11-11 DIAGNOSIS — M47812 Spondylosis without myelopathy or radiculopathy, cervical region: Secondary | ICD-10-CM | POA: Diagnosis not present

## 2011-11-11 DIAGNOSIS — M545 Low back pain, unspecified: Secondary | ICD-10-CM | POA: Diagnosis not present

## 2011-11-11 DIAGNOSIS — R51 Headache: Secondary | ICD-10-CM | POA: Diagnosis not present

## 2011-11-11 DIAGNOSIS — Z9889 Other specified postprocedural states: Secondary | ICD-10-CM | POA: Diagnosis not present

## 2011-11-11 DIAGNOSIS — Z79899 Other long term (current) drug therapy: Secondary | ICD-10-CM | POA: Diagnosis not present

## 2011-11-19 ENCOUNTER — Ambulatory Visit: Payer: Self-pay | Admitting: Pain Medicine

## 2011-11-19 DIAGNOSIS — M47817 Spondylosis without myelopathy or radiculopathy, lumbosacral region: Secondary | ICD-10-CM | POA: Diagnosis not present

## 2011-11-19 DIAGNOSIS — M545 Low back pain, unspecified: Secondary | ICD-10-CM | POA: Diagnosis not present

## 2011-11-19 DIAGNOSIS — M546 Pain in thoracic spine: Secondary | ICD-10-CM | POA: Diagnosis not present

## 2011-11-19 DIAGNOSIS — IMO0002 Reserved for concepts with insufficient information to code with codable children: Secondary | ICD-10-CM | POA: Diagnosis not present

## 2011-11-19 DIAGNOSIS — M79609 Pain in unspecified limb: Secondary | ICD-10-CM | POA: Diagnosis not present

## 2011-11-19 DIAGNOSIS — M62838 Other muscle spasm: Secondary | ICD-10-CM | POA: Diagnosis not present

## 2011-11-19 DIAGNOSIS — G8929 Other chronic pain: Secondary | ICD-10-CM | POA: Diagnosis not present

## 2011-11-19 DIAGNOSIS — M542 Cervicalgia: Secondary | ICD-10-CM | POA: Diagnosis not present

## 2011-11-19 DIAGNOSIS — M47812 Spondylosis without myelopathy or radiculopathy, cervical region: Secondary | ICD-10-CM | POA: Diagnosis not present

## 2011-12-09 ENCOUNTER — Ambulatory Visit: Payer: Self-pay | Admitting: Pain Medicine

## 2011-12-09 DIAGNOSIS — M47812 Spondylosis without myelopathy or radiculopathy, cervical region: Secondary | ICD-10-CM | POA: Diagnosis not present

## 2011-12-09 DIAGNOSIS — M79609 Pain in unspecified limb: Secondary | ICD-10-CM | POA: Diagnosis not present

## 2011-12-09 DIAGNOSIS — M542 Cervicalgia: Secondary | ICD-10-CM | POA: Diagnosis not present

## 2011-12-09 DIAGNOSIS — Z79899 Other long term (current) drug therapy: Secondary | ICD-10-CM | POA: Diagnosis not present

## 2011-12-09 DIAGNOSIS — M545 Low back pain, unspecified: Secondary | ICD-10-CM | POA: Diagnosis not present

## 2011-12-09 DIAGNOSIS — G8929 Other chronic pain: Secondary | ICD-10-CM | POA: Diagnosis not present

## 2011-12-17 ENCOUNTER — Ambulatory Visit: Payer: Self-pay | Admitting: Pain Medicine

## 2011-12-17 DIAGNOSIS — M47812 Spondylosis without myelopathy or radiculopathy, cervical region: Secondary | ICD-10-CM | POA: Diagnosis not present

## 2011-12-17 DIAGNOSIS — M62838 Other muscle spasm: Secondary | ICD-10-CM | POA: Diagnosis not present

## 2011-12-17 DIAGNOSIS — M79609 Pain in unspecified limb: Secondary | ICD-10-CM | POA: Diagnosis not present

## 2011-12-17 DIAGNOSIS — M545 Low back pain, unspecified: Secondary | ICD-10-CM | POA: Diagnosis not present

## 2011-12-17 DIAGNOSIS — M4716 Other spondylosis with myelopathy, lumbar region: Secondary | ICD-10-CM | POA: Diagnosis not present

## 2011-12-17 DIAGNOSIS — G8929 Other chronic pain: Secondary | ICD-10-CM | POA: Diagnosis not present

## 2011-12-24 ENCOUNTER — Other Ambulatory Visit (INDEPENDENT_AMBULATORY_CARE_PROVIDER_SITE_OTHER): Payer: BC Managed Care – PPO | Admitting: *Deleted

## 2011-12-24 DIAGNOSIS — L719 Rosacea, unspecified: Secondary | ICD-10-CM | POA: Diagnosis not present

## 2011-12-24 DIAGNOSIS — L738 Other specified follicular disorders: Secondary | ICD-10-CM | POA: Diagnosis not present

## 2011-12-24 DIAGNOSIS — Z872 Personal history of diseases of the skin and subcutaneous tissue: Secondary | ICD-10-CM | POA: Diagnosis not present

## 2011-12-24 DIAGNOSIS — L821 Other seborrheic keratosis: Secondary | ICD-10-CM | POA: Diagnosis not present

## 2011-12-24 DIAGNOSIS — E785 Hyperlipidemia, unspecified: Secondary | ICD-10-CM

## 2011-12-24 LAB — LIPID PANEL: HDL: 47.5 mg/dL (ref >39.00)

## 2011-12-26 ENCOUNTER — Other Ambulatory Visit: Payer: Self-pay | Admitting: *Deleted

## 2011-12-26 MED ORDER — ATORVASTATIN CALCIUM 20 MG PO TABS: 20.0000 mg | ORAL_TABLET | Freq: Every day | ORAL | Status: DC

## 2011-12-31 ENCOUNTER — Ambulatory Visit (INDEPENDENT_AMBULATORY_CARE_PROVIDER_SITE_OTHER): Payer: BC Managed Care – PPO | Admitting: Internal Medicine

## 2011-12-31 ENCOUNTER — Encounter: Payer: Self-pay | Admitting: Internal Medicine

## 2011-12-31 VITALS — BP 120/68 | HR 72 | Temp 98.0°F | Resp 16 | Wt 174.8 lb

## 2011-12-31 DIAGNOSIS — E119 Type 2 diabetes mellitus without complications: Secondary | ICD-10-CM

## 2011-12-31 DIAGNOSIS — E785 Hyperlipidemia, unspecified: Secondary | ICD-10-CM

## 2011-12-31 DIAGNOSIS — I428 Other cardiomyopathies: Secondary | ICD-10-CM

## 2011-12-31 DIAGNOSIS — K2100 Gastro-esophageal reflux disease with esophagitis, without bleeding: Secondary | ICD-10-CM

## 2011-12-31 DIAGNOSIS — E669 Obesity, unspecified: Secondary | ICD-10-CM

## 2011-12-31 MED ORDER — DEXLANSOPRAZOLE 60 MG PO CPDR: 60.0000 mg | DELAYED_RELEASE_CAPSULE | Freq: Every day | ORAL | Status: DC

## 2011-12-31 NOTE — Assessment & Plan Note (Signed)
She has gained weight on a low glycemic index diet because she has been reduce her carbohydrate intake due to the continued use of bananas,  granola and other cereals. She is not satisfied on his diet and has a continued cravings for higher carbohydrate foods. I have recommended that she stop the low glycemic index diet and resume Weight Watchers which worked for her in the past. We discussed a goal of 10% of her body weight in order to reduce her triglycerides and her BMI to below 30.  I recommended that she find alternative to the treadmill for exercise on the days that she is not swimming since the treadmill is aggravating her knee pain

## 2011-12-31 NOTE — Assessment & Plan Note (Signed)
Suggested by her symptoms of esophageal burning postprandially. Trial of Dexsilant  60 mg daily for 3 weeks. If her symptoms improve we will switch her to a PPI that is covered by her insurance. If her symptoms do not improve we will need further workup. Samples given.

## 2011-12-31 NOTE — Patient Instructions (Addendum)
For your heartburn ,  Let's try  Dexilant, (samples) for "reflux esophagitis."  Forget the low carb diet, and go back to Weight Watchers.  Your goal is 17 lbs.    Continue swimming ,  But add some other form of exercise 3 more days per week that raises your heart rate and doesn't stress your knee   Return in 2 months for fasting lipids and A1c

## 2011-12-31 NOTE — Assessment & Plan Note (Signed)
Managed with 6 month ECHOS by Gwen Pounds, next one in Nov

## 2011-12-31 NOTE — Assessment & Plan Note (Signed)
Her last hemoglobin A1c was 6.0 in June. She will return in a few weeks for repeat.

## 2011-12-31 NOTE — Assessment & Plan Note (Addendum)
She Started atorvastatin last week for LDL of 159 and triglycerides of 238. She will return in 6-8 weeks for repeat fasting lipids.

## 2011-12-31 NOTE — Progress Notes (Addendum)
Patient ID: Julia Pierce, female   DOB: [birth year], 56 y.o.   MRN: [id number] Patient Active Problem List  Diagnosis  . Hypertension  . Depression  . Holter monitor, abnormal  . Long QT syndrome  . Valvular cardiomyopathy  . Hyperlipidemia LDL goal < 100  . Morton's neuroma  . Obesity (BMI 30-39.9)  . Diabetes mellitus type 2, diet-controlled  . Reflux esophagitis    Subjective:  CC:   Chief Complaint  Patient presents with  . Follow-up    3 month    HPI:   Julia Pierce a 56 y.o. female who presents Follow up on chronic medical conditions including diet-controlled diabetes, obesity, hyperlipidemia. hyperlipidemia.  Started lipitor last week,  Gained 2 lbs on the low carb diet but was using trail mix with raisins., still eating granola,  bananas.  Wants to go back to weight watchers.  Feels she is having too many cravings on this diet .  Swimming twice a week and waliing on treadmill but bothering her knee.  Anxiety improved with exercise.  Having reflux symptoms which she describes as a burning in her esophagus which is brought on by eating.     Past Medical History  Diagnosis Date  . Arthritis   . Chicken pox   . Headache, frequent episodic tension-type   . Cardiac arrhythmia due to congenital heart disease   . History of high blood pressure     readings  . High cholesterol   . Migraines   . Hx of colonoscopy sept 2012    normal,  next due 2022, [name]  . Hypertension   . Depression   . Holter monitor, abnormal August 2012    done for long QT.  some contractile asynchrony Gwen Pounds)  . Morton's neuroma     bilateral    Past Surgical History  Procedure Date  . Appendectomy 1973  . Tonsillectomy and adenoidectomy 1966  . Spinal fusion 03/2007    ruptured disc  L5  [name]  . Hysteroscopy 2005    heavy bleeding         The following portions of the patient's history were reviewed and updated as appropriate: Allergies, current medications, and problem  list.    Review of Systems:   12 Pt  review of systems was negative except those addressed in the HPI,     History   Social History  . Marital Status: Married    Spouse Name: N/A    Number of Children: N/A  . Years of Education: N/A   Occupational History  . Not on file.   Social History Main Topics  . Smoking status: Never Smoker   . Smokeless tobacco: Never Used  . Alcohol Use: No  . Drug Use: No  . Sexually Active: Not on file   Other Topics Concern  . Not on file   Social History Narrative  . No narrative on file    Objective:  BP 120/68  Pulse 72  Temp 98 F (36.7 C) (Oral)  Resp 16  Wt 174 lb 12 oz (79.266 kg)  SpO2 96%  General appearance: alert, cooperative and appears stated age Ears: normal TM's and external ear canals both ears Throat: lips, mucosa, and tongue normal; teeth and gums normal Neck: no adenopathy, no carotid bruit, supple, symmetrical, trachea midline and thyroid not enlarged, symmetric, no tenderness/mass/nodules Back: symmetric, no curvature. ROM normal. No CVA tenderness. Lungs: clear to auscultation bilaterally Heart: regular rate and rhythm, S1, S2 normal, no  murmur, click, rub or gallop Abdomen: soft, non-tender; bowel sounds normal; no masses,  no organomegaly Pulses: 2+ and symmetric Skin: Skin color, texture, turgor normal. No rashes or lesions Lymph nodes: Cervical, supraclavicular, and axillary nodes normal.  Assessment and Plan:  Hyperlipidemia LDL goal < 100 She Started atorvastatin last week for LDL of 159 and triglycerides of 238. She will return in 6-8 weeks for repeat fasting lipids.   Valvular cardiomyopathy Managed with 6 month ECHOS by Gwen Pounds, next one in Nov  Obesity (BMI 30-39.9) She has gained weight on a low glycemic index diet because she has been reduce her carbohydrate intake due to the continued use of bananas,  granola and other cereals. She is not satisfied on his diet and has a continued  cravings for higher carbohydrate foods. I have recommended that she stop the low glycemic index diet and resume Weight Watchers which worked for her in the past. We discussed a goal of 10% of her body weight in order to reduce her triglycerides and her BMI to below 30.  I recommended that she find alternative to the treadmill for exercise on the days that she is not swimming since the treadmill is aggravating her knee pain  Diabetes mellitus type 2, diet-controlled Her last hemoglobin A1c was 6.0 in June. She will return in a few weeks for repeat.  Reflux esophagitis Suggested by her symptoms of esophageal burning postprandially. Trial of Dexsilant  60 mg daily for 3 weeks. If her symptoms improve we will switch her to a PPI that is covered by her insurance. If her symptoms do not improve we will need further workup. Samples given.   Updated Medication List Outpatient Encounter Prescriptions as of 12/31/2011  Medication Sig Dispense Refill  . atorvastatin (LIPITOR) 20 MG tablet Take 1 tablet (20 mg total) by mouth daily.  90 tablet  2  . B Complex Vitamins (B COMPLEX PO) Take by mouth.      . calcium carbonate (OS-CAL) 600 MG TABS Take 600 mg by mouth daily.        . diphenhydrAMINE (BENADRYL) 25 MG tablet Take 25 mg by mouth at bedtime as needed.        . fish oil-omega-3 fatty acids 1000 MG capsule Take 2 g by mouth daily.        . fluticasone (FLONASE) 50 MCG/ACT nasal spray Place 2 sprays into the nose daily.        Marland Kitchen HYDROcodone-acetaminophen (NORCO) 5-325 MG per tablet Take 1 tablet by mouth every 6 (six) hours as needed.        Marland Kitchen losartan-hydrochlorothiazide (HYZAAR) 100-25 MG per tablet Take 1 tablet by mouth daily.  90 tablet  3  . methocarbamol (ROBAXIN) 500 MG tablet TAKE 1 TABLET BY MOUTH TWICE DAILY AS NEEDED FOR SPASM  180 tablet  2  . metoprolol (LOPRESSOR) 50 MG tablet Take 1 tablet (50 mg total) by mouth 2 (two) times daily.  180 tablet  3  . Multiple Vitamin (MULTIVITAMIN)  tablet Take 1 tablet by mouth daily.        . sertraline (ZOLOFT) 100 MG tablet Take 1 tablet (100 mg total) by mouth daily.  90 tablet  3  . spironolactone (ALDACTONE) 25 MG tablet Take 25 mg by mouth daily.      Marland Kitchen dexlansoprazole (DEXILANT) 60 MG capsule Take 1 capsule (60 mg total) by mouth daily.  21 capsule  0  . DISCONTD: ketoconazole (NIZORAL) 2 % shampoo Apply 1 application topically  2 (two) times a week.           Orders Placed This Encounter  Procedures  . HM MAMMOGRAPHY  . HM PAP SMEAR  . Lipid panel  . TSH  . Hemoglobin A1c  . Comprehensive metabolic panel  . HM COLONOSCOPY    No Follow-up on file.

## 2012-01-08 ENCOUNTER — Ambulatory Visit: Payer: Self-pay | Admitting: Pain Medicine

## 2012-01-08 DIAGNOSIS — M542 Cervicalgia: Secondary | ICD-10-CM | POA: Diagnosis not present

## 2012-01-08 DIAGNOSIS — M5430 Sciatica, unspecified side: Secondary | ICD-10-CM | POA: Diagnosis not present

## 2012-01-08 DIAGNOSIS — M62838 Other muscle spasm: Secondary | ICD-10-CM | POA: Diagnosis not present

## 2012-01-08 DIAGNOSIS — M79609 Pain in unspecified limb: Secondary | ICD-10-CM | POA: Diagnosis not present

## 2012-01-08 DIAGNOSIS — Z79899 Other long term (current) drug therapy: Secondary | ICD-10-CM | POA: Diagnosis not present

## 2012-01-08 DIAGNOSIS — M545 Low back pain, unspecified: Secondary | ICD-10-CM | POA: Diagnosis not present

## 2012-01-08 DIAGNOSIS — Z9889 Other specified postprocedural states: Secondary | ICD-10-CM | POA: Diagnosis not present

## 2012-01-08 DIAGNOSIS — M47812 Spondylosis without myelopathy or radiculopathy, cervical region: Secondary | ICD-10-CM | POA: Diagnosis not present

## 2012-01-16 ENCOUNTER — Telehealth: Payer: Self-pay | Admitting: Internal Medicine

## 2012-01-16 DIAGNOSIS — K219 Gastro-esophageal reflux disease without esophagitis: Secondary | ICD-10-CM

## 2012-01-16 NOTE — Telephone Encounter (Signed)
Patient called and stated at her last visit you gave her samples of Dexilant and stated if it helps you will send in another Rx that is covered by her insurance.  She stated the Dexilant is working well and would like an Rx called in.  Please advise.

## 2012-01-19 MED ORDER — ESOMEPRAZOLE MAGNESIUM 40 MG PO CPDR: 40.0000 mg | DELAYED_RELEASE_CAPSULE | Freq: Every day | ORAL | Status: DC

## 2012-01-19 NOTE — Telephone Encounter (Signed)
nexium sent to pharmacy.

## 2012-01-21 ENCOUNTER — Telehealth: Payer: Self-pay | Admitting: Internal Medicine

## 2012-01-21 ENCOUNTER — Other Ambulatory Visit: Payer: Self-pay

## 2012-01-21 DIAGNOSIS — K219 Gastro-esophageal reflux disease without esophagitis: Secondary | ICD-10-CM

## 2012-01-21 MED ORDER — ESOMEPRAZOLE MAGNESIUM 40 MG PO CPDR: 40.0000 mg | DELAYED_RELEASE_CAPSULE | Freq: Every day | ORAL | Status: DC

## 2012-01-21 NOTE — Telephone Encounter (Signed)
Esomeprazole 40 mg #30 3 R sent to [facility]

## 2012-01-21 NOTE — Telephone Encounter (Signed)
Pt came in today the nexium is not at the pharmacy [facility] Please advise when this is called in   Pt is completely out of meds

## 2012-01-21 NOTE — Telephone Encounter (Signed)
Pt also wanted to let dr Darrick Huntsman know  That she saw dr Janene Harvey @ [facility] and Dr Janene Harvey wanted dr Darrick Huntsman to order additional labs next month when pt comes in Vit d level

## 2012-01-27 NOTE — Telephone Encounter (Signed)
I checked around the desk, I did not see a PA form

## 2012-01-27 NOTE — Telephone Encounter (Signed)
Pt called back checking on this rx   Checking on prior autho  Cell [phone]

## 2012-01-28 ENCOUNTER — Telehealth: Payer: Self-pay | Admitting: Internal Medicine

## 2012-01-28 NOTE — Telephone Encounter (Signed)
Refill request for nexium dr 40 mg capsule Qty: 30 Sig: take 1 capsule ( 40 mg total) by mouth daily This needs prior authorization PA phone # [phone]

## 2012-01-28 NOTE — Telephone Encounter (Signed)
Bjorn Loser,  I laid the signed PA  on your keyboard Tuesday morning.  We need to start using the red folder to collect things that need signing.. Also have you checked my box in the front office?    Thanks,  TT

## 2012-01-28 NOTE — Telephone Encounter (Signed)
Form faxed

## 2012-01-30 NOTE — Telephone Encounter (Signed)
r'cvd another request for this prior auth.  From another telephone encounter it looks as if the form has been faxed to pharmacy, will you please refax to them.

## 2012-02-04 ENCOUNTER — Telehealth: Payer: Self-pay | Admitting: Internal Medicine

## 2012-02-04 NOTE — Telephone Encounter (Signed)
Pt came by office she says [facility] never [facility] form that was faxed pn 01/28/12. I re-faxed the form today. She may need some more samples of Dexilant until her rx is filled. I told pt to contact her pharmacy in a couple of hours to make sure it was received.

## 2012-02-05 ENCOUNTER — Telehealth: Payer: Self-pay | Admitting: Internal Medicine

## 2012-02-05 NOTE — Telephone Encounter (Signed)
Pt called about her nexium rx  It needs prior autho  The # for this is [phone]

## 2012-02-06 NOTE — Telephone Encounter (Signed)
Spoke to Sharon for [facility] PA department she stated that the patient is in a preferred Rx plan and   has to try the generic therapy such as Omeprazole and Pantoprazole  for 30 days as a Rx and fail and after that the Nexium will begin to authorize.  

## 2012-02-07 MED ORDER — PANTOPRAZOLE SODIUM 40 MG PO TBEC: 40.0000 mg | DELAYED_RELEASE_TABLET | Freq: Every day | ORAL | Status: DC

## 2012-02-07 NOTE — Telephone Encounter (Signed)
rx for [facility]oprazole [facility] mg [facility] [address]

## 2012-02-10 ENCOUNTER — Ambulatory Visit: Payer: Self-pay | Admitting: Pain Medicine

## 2012-02-10 DIAGNOSIS — M531 Cervicobrachial syndrome: Secondary | ICD-10-CM | POA: Diagnosis not present

## 2012-02-10 DIAGNOSIS — Z9889 Other specified postprocedural states: Secondary | ICD-10-CM | POA: Diagnosis not present

## 2012-02-10 DIAGNOSIS — M5430 Sciatica, unspecified side: Secondary | ICD-10-CM | POA: Diagnosis not present

## 2012-02-10 DIAGNOSIS — M79609 Pain in unspecified limb: Secondary | ICD-10-CM | POA: Diagnosis not present

## 2012-02-10 DIAGNOSIS — M542 Cervicalgia: Secondary | ICD-10-CM | POA: Diagnosis not present

## 2012-02-10 DIAGNOSIS — Z79899 Other long term (current) drug therapy: Secondary | ICD-10-CM | POA: Diagnosis not present

## 2012-02-10 DIAGNOSIS — M461 Sacroiliitis, not elsewhere classified: Secondary | ICD-10-CM | POA: Diagnosis not present

## 2012-02-10 DIAGNOSIS — M5030 Other cervical disc degeneration, unspecified cervical region: Secondary | ICD-10-CM | POA: Diagnosis not present

## 2012-02-10 DIAGNOSIS — M545 Low back pain, unspecified: Secondary | ICD-10-CM | POA: Diagnosis not present

## 2012-02-10 DIAGNOSIS — M546 Pain in thoracic spine: Secondary | ICD-10-CM | POA: Diagnosis not present

## 2012-02-10 DIAGNOSIS — R51 Headache: Secondary | ICD-10-CM | POA: Diagnosis not present

## 2012-02-11 ENCOUNTER — Encounter: Payer: Self-pay | Admitting: Internal Medicine

## 2012-02-11 ENCOUNTER — Telehealth: Payer: Self-pay | Admitting: Internal Medicine

## 2012-02-11 NOTE — Telephone Encounter (Signed)
[facility] to find out what [facility] patient needed.  I spoke with Shawna Orleans the pharmacist states that Nexium was called in for the patient today.  She isn't aware of another prescription that the patient needs.

## 2012-02-11 NOTE — Telephone Encounter (Signed)
Julia [facility] at [address] is calling about rx her number [phone]

## 2012-02-27 ENCOUNTER — Other Ambulatory Visit: Payer: Self-pay | Admitting: *Deleted

## 2012-03-01 ENCOUNTER — Ambulatory Visit: Payer: BC Managed Care – PPO | Admitting: Internal Medicine

## 2012-03-01 ENCOUNTER — Other Ambulatory Visit (INDEPENDENT_AMBULATORY_CARE_PROVIDER_SITE_OTHER): Payer: BC Managed Care – PPO

## 2012-03-01 DIAGNOSIS — E119 Type 2 diabetes mellitus without complications: Secondary | ICD-10-CM

## 2012-03-01 DIAGNOSIS — E785 Hyperlipidemia, unspecified: Secondary | ICD-10-CM

## 2012-03-01 LAB — COMPREHENSIVE METABOLIC PANEL
ALT: 24 U/L (ref 0–35)
AST: 22 U/L (ref 0–37)
Albumin: 4.4 g/dL (ref 3.5–5.2)
Alkaline Phosphatase: 57 U/L (ref 39–117)
Calcium: 9.5 mg/dL (ref 8.4–10.5)
Chloride: 105 mEq/L (ref 96–112)
Potassium: 4.4 mEq/L (ref 3.5–5.1)

## 2012-03-01 LAB — TSH: TSH: 3.2 u[IU]/mL (ref 0.35–5.50)

## 2012-03-01 LAB — HEMOGLOBIN A1C: Hgb A1c MFr Bld: 5.8 % (ref 4.6–6.5)

## 2012-03-03 ENCOUNTER — Encounter: Payer: Self-pay | Admitting: Internal Medicine

## 2012-03-03 DIAGNOSIS — I519 Heart disease, unspecified: Secondary | ICD-10-CM | POA: Diagnosis not present

## 2012-03-03 DIAGNOSIS — Z789 Other specified health status: Secondary | ICD-10-CM

## 2012-03-03 DIAGNOSIS — T466X5A Adverse effect of antihyperlipidemic and antiarteriosclerotic drugs, initial encounter: Secondary | ICD-10-CM | POA: Insufficient documentation

## 2012-03-03 DIAGNOSIS — I4949 Other premature depolarization: Secondary | ICD-10-CM | POA: Diagnosis not present

## 2012-03-03 DIAGNOSIS — I059 Rheumatic mitral valve disease, unspecified: Secondary | ICD-10-CM | POA: Diagnosis not present

## 2012-03-03 DIAGNOSIS — R0989 Other specified symptoms and signs involving the circulatory and respiratory systems: Secondary | ICD-10-CM | POA: Diagnosis not present

## 2012-03-03 DIAGNOSIS — R0609 Other forms of dyspnea: Secondary | ICD-10-CM | POA: Diagnosis not present

## 2012-03-03 NOTE — Assessment & Plan Note (Signed)
Secondary to recurrent myalgias.

## 2012-03-11 ENCOUNTER — Ambulatory Visit: Payer: Self-pay | Admitting: Pain Medicine

## 2012-03-11 DIAGNOSIS — M79609 Pain in unspecified limb: Secondary | ICD-10-CM | POA: Diagnosis not present

## 2012-03-11 DIAGNOSIS — M542 Cervicalgia: Secondary | ICD-10-CM | POA: Diagnosis not present

## 2012-03-11 DIAGNOSIS — R51 Headache: Secondary | ICD-10-CM | POA: Diagnosis not present

## 2012-03-11 DIAGNOSIS — M531 Cervicobrachial syndrome: Secondary | ICD-10-CM | POA: Diagnosis not present

## 2012-03-11 DIAGNOSIS — M546 Pain in thoracic spine: Secondary | ICD-10-CM | POA: Diagnosis not present

## 2012-03-11 DIAGNOSIS — M461 Sacroiliitis, not elsewhere classified: Secondary | ICD-10-CM | POA: Diagnosis not present

## 2012-03-11 DIAGNOSIS — Z79899 Other long term (current) drug therapy: Secondary | ICD-10-CM | POA: Diagnosis not present

## 2012-03-11 DIAGNOSIS — M545 Low back pain, unspecified: Secondary | ICD-10-CM | POA: Diagnosis not present

## 2012-03-15 ENCOUNTER — Ambulatory Visit: Payer: Self-pay | Admitting: Obstetrics and Gynecology

## 2012-03-15 DIAGNOSIS — E785 Hyperlipidemia, unspecified: Secondary | ICD-10-CM | POA: Diagnosis not present

## 2012-03-15 DIAGNOSIS — I10 Essential (primary) hypertension: Secondary | ICD-10-CM | POA: Diagnosis not present

## 2012-03-15 DIAGNOSIS — R0602 Shortness of breath: Secondary | ICD-10-CM | POA: Diagnosis not present

## 2012-03-15 DIAGNOSIS — R9431 Abnormal electrocardiogram [ECG] [EKG]: Secondary | ICD-10-CM | POA: Diagnosis not present

## 2012-03-15 DIAGNOSIS — K2100 Gastro-esophageal reflux disease with esophagitis, without bleeding: Secondary | ICD-10-CM | POA: Diagnosis not present

## 2012-03-15 DIAGNOSIS — I519 Heart disease, unspecified: Secondary | ICD-10-CM | POA: Diagnosis not present

## 2012-03-15 LAB — BASIC METABOLIC PANEL
Anion Gap: 8 (ref 7–16)
Calcium, Total: 9.4 mg/dL (ref 8.5–10.1)
Chloride: 106 mmol/L (ref 98–107)
Co2: 25 mmol/L (ref 21–32)
EGFR (Non-African Amer.): >60
Glucose: 91 mg/dL (ref 65–99)
Osmolality: 277 (ref 275–301)
Potassium: 3.7 mmol/L (ref 3.5–5.1)
Sodium: 139 mmol/L (ref 136–145)

## 2012-03-15 LAB — CBC
HCT: 36 % (ref 35.0–47.0)
HGB: 13.2 g/dL (ref 12.0–16.0)
MCH: 30.7 pg (ref 26.0–34.0)
MCHC: 36.5 g/dL — ABNORMAL HIGH (ref 32.0–36.0)
MCV: 84 fL (ref 80–100)
RDW: 13.3 % (ref 11.5–14.5)
WBC: 7.9 10*3/uL (ref 3.6–11.0)

## 2012-03-21 HISTORY — PX: ABDOMINAL HYSTERECTOMY: SHX81

## 2012-03-23 ENCOUNTER — Other Ambulatory Visit: Payer: Self-pay | Admitting: Internal Medicine

## 2012-03-25 ENCOUNTER — Ambulatory Visit: Payer: Self-pay | Admitting: Obstetrics and Gynecology

## 2012-03-25 DIAGNOSIS — Z79899 Other long term (current) drug therapy: Secondary | ICD-10-CM | POA: Diagnosis not present

## 2012-03-25 DIAGNOSIS — I499 Cardiac arrhythmia, unspecified: Secondary | ICD-10-CM | POA: Diagnosis not present

## 2012-03-25 DIAGNOSIS — J309 Allergic rhinitis, unspecified: Secondary | ICD-10-CM | POA: Diagnosis not present

## 2012-03-25 DIAGNOSIS — F329 Major depressive disorder, single episode, unspecified: Secondary | ICD-10-CM | POA: Diagnosis not present

## 2012-03-25 DIAGNOSIS — N812 Incomplete uterovaginal prolapse: Secondary | ICD-10-CM | POA: Diagnosis not present

## 2012-03-25 DIAGNOSIS — G8929 Other chronic pain: Secondary | ICD-10-CM | POA: Diagnosis not present

## 2012-03-25 DIAGNOSIS — N393 Stress incontinence (female) (male): Secondary | ICD-10-CM | POA: Diagnosis not present

## 2012-03-25 DIAGNOSIS — M549 Dorsalgia, unspecified: Secondary | ICD-10-CM | POA: Diagnosis not present

## 2012-03-25 DIAGNOSIS — F3289 Other specified depressive episodes: Secondary | ICD-10-CM | POA: Diagnosis not present

## 2012-03-25 DIAGNOSIS — N920 Excessive and frequent menstruation with regular cycle: Secondary | ICD-10-CM | POA: Diagnosis not present

## 2012-03-25 DIAGNOSIS — I10 Essential (primary) hypertension: Secondary | ICD-10-CM | POA: Diagnosis not present

## 2012-03-26 DIAGNOSIS — N920 Excessive and frequent menstruation with regular cycle: Secondary | ICD-10-CM | POA: Diagnosis not present

## 2012-03-26 DIAGNOSIS — N393 Stress incontinence (female) (male): Secondary | ICD-10-CM | POA: Diagnosis not present

## 2012-03-26 DIAGNOSIS — N812 Incomplete uterovaginal prolapse: Secondary | ICD-10-CM | POA: Diagnosis not present

## 2012-03-26 DIAGNOSIS — J309 Allergic rhinitis, unspecified: Secondary | ICD-10-CM | POA: Diagnosis not present

## 2012-03-26 DIAGNOSIS — G8929 Other chronic pain: Secondary | ICD-10-CM | POA: Diagnosis not present

## 2012-03-26 DIAGNOSIS — M549 Dorsalgia, unspecified: Secondary | ICD-10-CM | POA: Diagnosis not present

## 2012-03-26 LAB — PATHOLOGY REPORT

## 2012-03-26 LAB — HEMATOCRIT: HCT: 33.5 % — ABNORMAL LOW (ref 35.0–47.0)

## 2012-03-31 ENCOUNTER — Ambulatory Visit: Payer: BC Managed Care – PPO | Admitting: Internal Medicine

## 2012-04-01 ENCOUNTER — Other Ambulatory Visit: Payer: Self-pay

## 2012-04-02 ENCOUNTER — Other Ambulatory Visit: Payer: Self-pay

## 2012-04-02 DIAGNOSIS — K219 Gastro-esophageal reflux disease without esophagitis: Secondary | ICD-10-CM

## 2012-04-02 MED ORDER — ESOMEPRAZOLE MAGNESIUM 40 MG PO CPDR
40.0000 mg | DELAYED_RELEASE_CAPSULE | Freq: Every day | ORAL | Status: DC
Start: 2012-04-02 — End: 2013-04-12

## 2012-04-02 NOTE — Telephone Encounter (Signed)
Patient request a 90 day supply of [facility]

## 2012-04-05 ENCOUNTER — Encounter: Payer: Self-pay | Admitting: Internal Medicine

## 2012-04-05 ENCOUNTER — Ambulatory Visit (INDEPENDENT_AMBULATORY_CARE_PROVIDER_SITE_OTHER): Payer: BC Managed Care – PPO | Admitting: Internal Medicine

## 2012-04-05 ENCOUNTER — Ambulatory Visit: Payer: BC Managed Care – PPO | Admitting: Internal Medicine

## 2012-04-05 VITALS — BP 120/70 | HR 69 | Temp 98.0°F | Resp 12 | Ht 62.5 in | Wt 168.8 lb

## 2012-04-05 DIAGNOSIS — R5383 Other fatigue: Secondary | ICD-10-CM

## 2012-04-05 DIAGNOSIS — E785 Hyperlipidemia, unspecified: Secondary | ICD-10-CM

## 2012-04-05 DIAGNOSIS — Z789 Other specified health status: Secondary | ICD-10-CM

## 2012-04-05 DIAGNOSIS — I10 Essential (primary) hypertension: Secondary | ICD-10-CM

## 2012-04-05 DIAGNOSIS — I428 Other cardiomyopathies: Secondary | ICD-10-CM

## 2012-04-05 DIAGNOSIS — R5381 Other malaise: Secondary | ICD-10-CM | POA: Diagnosis not present

## 2012-04-05 DIAGNOSIS — E669 Obesity, unspecified: Secondary | ICD-10-CM

## 2012-04-05 DIAGNOSIS — Z888 Allergy status to other drugs, medicaments and biological substances status: Secondary | ICD-10-CM | POA: Diagnosis not present

## 2012-04-05 DIAGNOSIS — E119 Type 2 diabetes mellitus without complications: Secondary | ICD-10-CM | POA: Diagnosis not present

## 2012-04-05 DIAGNOSIS — I4581 Long QT syndrome: Secondary | ICD-10-CM

## 2012-04-05 LAB — HM DIABETES FOOT EXAM: HM Diabetic Foot Exam: NORMAL

## 2012-04-05 NOTE — Assessment & Plan Note (Signed)
well controlled  By last a1c.  She is up to date on eye exam

## 2012-04-05 NOTE — Progress Notes (Signed)
Patient ID: Julia Pierce, female   DOB: 1955-06-01, 56 y.o.   MRN: [id number]   Patient Active Problem List  Diagnosis  . Hypertension  . Depression  . Holter monitor, abnormal  . Long QT syndrome  . Valvular cardiomyopathy  . Hyperlipidemia LDL goal < 100  . Morton's neuroma  . Obesity (BMI 30-39.9)  . Diabetes mellitus type 2, diet-controlled  . Reflux esophagitis  . Statin intolerance    Subjective:  CC:   Chief Complaint  Patient presents with  . Follow-up    HPI:   Julia Pierce a 56 y.o. female who presents for hospital followup She is 2 weeks post op S/p vaginal hysterectomy and bladder sling which was done on Dec 5th.  dc'd dec 6th,  Had to  wear a Foley catheter for a week due to urinary retention. Dr. Janene Harvey did the surgery.  Post op visit is Jan 2nd.   Had a lot of back pain post op . Had to increase her vicodin to 2 pills 3 times daily, now taking a total of 3 vicodin daily (325 mg tylenol component)    Past Medical History  Diagnosis Date  . Arthritis   . Chicken pox   . Headache, frequent episodic tension-type   . Cardiac arrhythmia due to congenital heart disease   . History of high blood pressure     readings  . High cholesterol   . Migraines   . Hx of colonoscopy sept 2012    normal,  next due 2022, [name]  . Hypertension   . Depression   . Holter monitor, abnormal August 2012    done for long QT.  some contractile asynchrony Gwen Pounds)  . Morton's neuroma     bilateral    Past Surgical History  Procedure Date  . Appendectomy 1973  . Tonsillectomy and adenoidectomy 1966  . Spinal fusion 03/2007    ruptured disc  L5  [name]  . Hysteroscopy 2005    heavy bleeding  . Abdominal hysterectomy Dec 2013    [place]         The following portions of the patient's history were reviewed and updated as appropriate: Allergies, current medications, and problem list.    Review of Systems:   Patient denies headache, fevers, malaise,  unintentional weight loss, skin rash, eye pain, sinus congestion and sinus pain, sore throat, dysphagia,  hemoptysis , cough, dyspnea, wheezing, chest pain, palpitations, orthopnea, edema, abdominal pain, nausea, melena, diarrhea, constipation, flank pain, dysuria, hematuria, urinary  Frequency, nocturia, numbness, tingling, seizures,  Focal weakness, Loss of consciousness,  Tremor, insomnia, depression, anxiety, and suicidal ideation.       History   Social History  . Marital Status: Married    Spouse Name: N/A    Number of Children: N/A  . Years of Education: N/A   Occupational History  . Not on file.   Social History Main Topics  . Smoking status: Never Smoker   . Smokeless tobacco: Never Used  . Alcohol Use: No  . Drug Use: No  . Sexually Active: Not on file   Other Topics Concern  . Not on file   Social History Narrative  . No narrative on file    Objective:  BP 120/70  Pulse 69  Temp 98 F (36.7 C) (Oral)  Resp 12  Ht 5' 2.5" (1.588 m)  Wt 168 lb 12 oz (76.544 kg)  BMI 30.37 kg/m2  SpO2 97%  General appearance: alert, cooperative and appears  stated age Ears: normal TM's and external ear canals both ears Throat: lips, mucosa, and tongue normal; teeth and gums normal Neck: no adenopathy, no carotid bruit, supple, symmetrical, trachea midline and thyroid not enlarged, symmetric, no tenderness/mass/nodules Back: symmetric, no curvature. ROM normal. No CVA tenderness. Lungs: clear to auscultation bilaterally Heart: regular rate and rhythm, S1, S2 normal, no murmur, click, rub or gallop Abdomen: soft, non-tender; bowel sounds normal; no masses,  no organomegaly Pulses: 2+ and symmetric Skin: Skin color, texture, turgor normal. No rashes or lesions Lymph nodes: Cervical, supraclavicular, and axillary nodes normal.  Assessment and Plan:  Obesity (BMI 30-39.9) Has lost another 6 lbs.  Encouragement provided.  Had hysterectomy 2 weeks ago.   Long QT  syndrome She had repeat cardiology evaluation preoperatively by Kyra Searles with stress ECHO  Valvular cardiomyopathy EFF 35% by last cardiology evaluation with spironolactone started, with MR and TR.  No symptoms currently  Hypertension Well controlled on current regimen. Renal function stable, no changes today.  Diabetes mellitus type 2, diet-controlled well controlled  By last a1c.  She is up to date on eye exam   Hyperlipidemia LDL goal < 100 She is statin intoleranr   Updated Medication List Outpatient Encounter Prescriptions as of 04/05/2012  Medication Sig Dispense Refill  . B Complex Vitamins (B COMPLEX PO) Take by mouth.      . calcium carbonate (OS-CAL) 600 MG TABS Take 600 mg by mouth daily.        . diphenhydrAMINE (BENADRYL) 25 MG tablet Take 25 mg by mouth at bedtime as needed.        Marland Kitchen esomeprazole (NEXIUM) 40 MG capsule Take 1 capsule (40 mg total) by mouth daily.  90 capsule  3  . fish oil-omega-3 fatty acids 1000 MG capsule Take 2 g by mouth daily.        Marland Kitchen HYDROcodone-acetaminophen (NORCO) 5-325 MG per tablet Take 1 tablet by mouth every 6 (six) hours as needed.        Marland Kitchen losartan-hydrochlorothiazide (HYZAAR) 100-25 MG per tablet Take 1 tablet by mouth daily.  90 tablet  3  . methocarbamol (ROBAXIN) 500 MG tablet TAKE 1 TABLET TWICE A DAY AS NEEDED SPASMS  180 tablet  0  . metoprolol (LOPRESSOR) 50 MG tablet Take 25 mg by mouth 2 (two) times daily.      . Multiple Vitamin (MULTIVITAMIN) tablet Take 1 tablet by mouth daily.        . nitrofurantoin (MACRODANTIN) 100 MG capsule Take 100 mg by mouth 4 (four) times daily.      . sertraline (ZOLOFT) 100 MG tablet Take 1 tablet (100 mg total) by mouth daily.  90 tablet  3  . spironolactone (ALDACTONE) 25 MG tablet Take 25 mg by mouth daily.      Marland Kitchen tolterodine (DETROL) 2 MG tablet Take 2 mg by mouth 2 (two) times daily.      . [DISCONTINUED] metoprolol (LOPRESSOR) 50 MG tablet Take 1 tablet (50 mg total) by mouth 2  (two) times daily.  180 tablet  3  . dexlansoprazole (DEXILANT) 60 MG capsule Take 1 capsule (60 mg total) by mouth daily.  21 capsule  0  . fluticasone (FLONASE) 50 MCG/ACT nasal spray Place 2 sprays into the nose daily.        . pantoprazole (PROTONIX) 40 MG tablet Take 1 tablet (40 mg total) by mouth daily.  30 tablet  3     Orders Placed This Encounter  Procedures  .  Microalbumin / creatinine urine ratio  . Hemoglobin A1c  . CBC with Differential  . Comprehensive metabolic panel  . HM DIABETES EYE EXAM  . HM DIABETES FOOT EXAM    No Follow-up on file.

## 2012-04-05 NOTE — Assessment & Plan Note (Signed)
She is statin intoleranr

## 2012-04-05 NOTE — Patient Instructions (Addendum)
Return in Late February and we will repeat your fasting labs prior to your visit   Once you stop spotting I recommend a baby aspirin daily

## 2012-04-05 NOTE — Assessment & Plan Note (Signed)
Has lost another 6 lbs.  Encouragement provided.  Had hysterectomy 2 weeks ago.

## 2012-04-05 NOTE — Assessment & Plan Note (Addendum)
EFF 35% by last cardiology evaluation with spironolactone started, with MR and TR.  No symptoms currently

## 2012-04-05 NOTE — Assessment & Plan Note (Signed)
Well controlled on current regimen. Renal function stable, no changes today. 

## 2012-04-05 NOTE — Assessment & Plan Note (Signed)
She had repeat cardiology evaluation preoperatively by Kyra Searles with stress ECHO

## 2012-04-07 ENCOUNTER — Ambulatory Visit: Payer: Self-pay | Admitting: Pain Medicine

## 2012-04-12 ENCOUNTER — Encounter: Payer: Self-pay | Admitting: Internal Medicine

## 2012-04-28 ENCOUNTER — Ambulatory Visit: Payer: Self-pay | Admitting: Obstetrics and Gynecology

## 2012-05-06 ENCOUNTER — Ambulatory Visit: Payer: Self-pay | Admitting: Pain Medicine

## 2012-05-12 DIAGNOSIS — I10 Essential (primary) hypertension: Secondary | ICD-10-CM | POA: Diagnosis not present

## 2012-05-12 DIAGNOSIS — I519 Heart disease, unspecified: Secondary | ICD-10-CM | POA: Diagnosis not present

## 2012-05-12 DIAGNOSIS — E785 Hyperlipidemia, unspecified: Secondary | ICD-10-CM | POA: Diagnosis not present

## 2012-05-12 DIAGNOSIS — I4949 Other premature depolarization: Secondary | ICD-10-CM | POA: Diagnosis not present

## 2012-06-05 ENCOUNTER — Other Ambulatory Visit: Payer: Self-pay

## 2012-06-10 ENCOUNTER — Ambulatory Visit: Payer: Self-pay | Admitting: Pain Medicine

## 2012-06-11 ENCOUNTER — Other Ambulatory Visit: Payer: BC Managed Care – PPO

## 2012-06-16 ENCOUNTER — Ambulatory Visit: Payer: BC Managed Care – PPO | Admitting: Internal Medicine

## 2012-06-22 ENCOUNTER — Other Ambulatory Visit: Payer: BC Managed Care – PPO

## 2012-07-04 ENCOUNTER — Other Ambulatory Visit: Payer: Self-pay | Admitting: Internal Medicine

## 2012-07-08 ENCOUNTER — Ambulatory Visit: Payer: Self-pay | Admitting: Pain Medicine

## 2012-07-23 ENCOUNTER — Other Ambulatory Visit (INDEPENDENT_AMBULATORY_CARE_PROVIDER_SITE_OTHER): Payer: BC Managed Care – PPO

## 2012-07-23 DIAGNOSIS — R5383 Other fatigue: Secondary | ICD-10-CM | POA: Diagnosis not present

## 2012-07-23 DIAGNOSIS — E119 Type 2 diabetes mellitus without complications: Secondary | ICD-10-CM

## 2012-07-23 DIAGNOSIS — R5381 Other malaise: Secondary | ICD-10-CM

## 2012-07-23 LAB — COMPREHENSIVE METABOLIC PANEL
Albumin: 4.2 g/dL (ref 3.5–5.2)
BUN: 16 mg/dL (ref 6–23)
CO2: 26 mEq/L (ref 19–32)
GFR: 81.83 mL/min (ref >60.00)
Glucose, Bld: 114 mg/dL — ABNORMAL HIGH (ref 70–99)
Potassium: 3.7 mEq/L (ref 3.5–5.1)
Sodium: 137 mEq/L (ref 135–145)
Total Protein: 7.2 g/dL (ref 6.0–8.3)

## 2012-07-23 LAB — CBC WITH DIFFERENTIAL/PLATELET
Basophils Relative: 0.6 % (ref 0.0–3.0)
Eosinophils Relative: 2.2 % (ref 0.0–5.0)
HCT: 38.2 % (ref 36.0–46.0)
Lymphs Abs: 2.6 10*3/uL (ref 0.7–4.0)
Monocytes Relative: 6.3 % (ref 3.0–12.0)
Neutrophils Relative %: 52.6 % (ref 43.0–77.0)
Platelets: 205 10*3/uL (ref 150.0–400.0)
RBC: 4.59 Mil/uL (ref 3.87–5.11)
WBC: 6.7 10*3/uL (ref 4.5–10.5)

## 2012-07-23 LAB — MICROALBUMIN / CREATININE URINE RATIO: Microalb, Ur: 0.6 mg/dL (ref 0.0–1.9)

## 2012-07-23 LAB — HEMOGLOBIN A1C: Hgb A1c MFr Bld: 6.2 % (ref 4.6–6.5)

## 2012-07-24 ENCOUNTER — Encounter: Payer: Self-pay | Admitting: Internal Medicine

## 2012-07-26 ENCOUNTER — Encounter: Payer: Self-pay | Admitting: Internal Medicine

## 2012-07-26 ENCOUNTER — Ambulatory Visit (INDEPENDENT_AMBULATORY_CARE_PROVIDER_SITE_OTHER): Payer: BC Managed Care – PPO | Admitting: Internal Medicine

## 2012-07-26 VITALS — BP 124/78 | HR 70 | Temp 97.5°F | Resp 18 | Wt 173.5 lb

## 2012-07-26 DIAGNOSIS — E119 Type 2 diabetes mellitus without complications: Secondary | ICD-10-CM | POA: Diagnosis not present

## 2012-07-26 DIAGNOSIS — R9431 Abnormal electrocardiogram [ECG] [EKG]: Secondary | ICD-10-CM

## 2012-07-26 DIAGNOSIS — I4581 Long QT syndrome: Secondary | ICD-10-CM | POA: Diagnosis not present

## 2012-07-26 DIAGNOSIS — Z789 Other specified health status: Secondary | ICD-10-CM

## 2012-07-26 DIAGNOSIS — E669 Obesity, unspecified: Secondary | ICD-10-CM

## 2012-07-26 DIAGNOSIS — Z888 Allergy status to other drugs, medicaments and biological substances status: Secondary | ICD-10-CM

## 2012-07-26 NOTE — Assessment & Plan Note (Signed)
Encouragement given,  Adherence to low glycemic index diet advised  and an increase in exercise as tolerated.

## 2012-07-26 NOTE — Progress Notes (Signed)
Patient ID: Julia Pierce, female   DOB: 09/09/1955, 57 y.o.   MRN: [id number]   Patient Active Problem List  Diagnosis  . Hypertension  . Depression  . Holter monitor, abnormal  . Long QT syndrome  . Valvular cardiomyopathy  . Hyperlipidemia LDL goal < 100  . Morton's neuroma  . Obesity (BMI 30-39.9)  . Diabetes mellitus type 2, diet-controlled  . Reflux esophagitis  . Statin intolerance    Subjective:  CC:   Chief Complaint  Patient presents with  . Follow-up    HPI:   Kyanne Rials a 57 y.o. female who presents for 3 month follow up on diabetes mellitus controlled with diet, long QT syndrome , chronic back pain  And obesity.  She has noted a little swelling in hands and less in her feet.  Dr Laurena Bering had reduced her metoprolol from 100 to 50 mg.  She has had 2 episodes in the last 2 months or presyncopal feelings lasting about 5 seconds that resolved spontaneously. These were not captured during her prior Holter monitor. Aggravated by lying on left side.  Having some bilateral numbness in fingers 1-3,  and more frequent headaches. Her pain specialist or an MRI the cervical spine which will be done after she returned from her trip to [place].   Regarding her diet controlled diabetes, she has been in the unnecessary carbohydrates from her diet but has been cooking with almond flour and quinoa.  She has not lost any weight. She is not exercising as much because it aggravates her back pain.  She's gained 5 pounds.   Past Medical History  Diagnosis Date  . Arthritis   . Chicken pox   . Headache, frequent episodic tension-type   . Cardiac arrhythmia due to congenital heart disease   . History of high blood pressure     readings  . High cholesterol   . Migraines   . Hx of colonoscopy sept 2012    normal,  next due 2022, [name]  . Hypertension   . Depression   . Holter monitor, abnormal August 2012    done for long QT.  some contractile asynchrony Gwen Pounds)  . Morton's  neuroma     bilateral    Past Surgical History  Procedure Laterality Date  . Appendectomy  1973  . Tonsillectomy and adenoidectomy  1966  . Spinal fusion  03/2007    ruptured disc  L5  [name]  . Hysteroscopy  2005    heavy bleeding  . Abdominal hysterectomy  Dec 2013    [place]       The following portions of the patient's history were reviewed and updated as appropriate: Allergies, current medications, and problem list.    Review of Systems:   Patient denies headache, fevers, malaise, unintentional weight loss, skin rash, eye pain, sinus congestion and sinus pain, sore throat, dysphagia,  hemoptysis , cough, dyspnea, wheezing, chest pain,rthopnea, edema, abdominal pain, nausea, melena, diarrhea, constipation, flank pain, dysuria, hematuria, urinary  Frequency, nocturia, numbness, tingling, seizures,  Focal weakness, Loss of consciousness,  Tremor, insomnia, depression, anxiety, and suicidal ideation.     History   Social History  . Marital Status: Married    Spouse Name: N/A    Number of Children: N/A  . Years of Education: N/A   Occupational History  . Not on file.   Social History Main Topics  . Smoking status: Never Smoker   . Smokeless tobacco: Never Used  . Alcohol Use: No  .  Drug Use: No  . Sexually Active: Not on file   Other Topics Concern  . Not on file   Social History Narrative  . No narrative on file    Objective:  BP 124/78  Pulse 70  Temp(Src) 97.5 F (36.4 C) (Oral)  Resp 18  Wt 173 lb 8 oz (78.699 kg)  BMI 31.21 kg/m2  SpO2 97%  General appearance: alert, cooperative and appears stated age Ears: normal TM's and external ear canals both ears Throat: lips, mucosa, and tongue normal; teeth and gums normal Neck: no adenopathy, no carotid bruit, supple, symmetrical, trachea midline and thyroid not enlarged, symmetric, no tenderness/mass/nodules Back: symmetric, no curvature. ROM normal. No CVA tenderness. Lungs: clear to auscultation  bilaterally Heart: regular rate and rhythm, S1, S2 normal, no murmur, click, rub or gallop Abdomen: soft, non-tender; bowel sounds normal; no masses,  no organomegaly Pulses: 2+ and symmetric Skin: Skin color, texture, turgor normal. No rashes or lesions Lymph nodes: Cervical, supraclavicular, and axillary nodes normal.  Assessment and Plan:  Long QT syndrome Still having episodes of presyncope last 3 to 5 secnods that did not occure when she was wearing the holter monitor  Diabetes mellitus type 2, diet-controlled Well-controlled on diet alone. Hemoglobin A1c is less than 7. She is on the appropriate medications. She is due for an eye exam. Foot exam was normal today except for some loss of sensation on the bottom of her left foot which was due to prior back surgery.  Holter monitor, abnormal I'm referring her back to Dr. Philemon Kingdom office for 30 day evaluation of heart rhythms with CardioNet given her recurrent episodes of presyncope and history of long QT syndrome.  Obesity (BMI 30-39.9) Encouragement given,  Adherence to low glycemic index diet advised  and an increase in exercise as tolerated.  Statin intolerance She is intolerant of statins. She is at managing her hyperlipidemia with diet alone. Triglycerides have been mildly elevated. Should improve with exercise and weight loss.   Updated Medication List Outpatient Encounter Prescriptions as of 07/26/2012  Medication Sig Dispense Refill  . B Complex Vitamins (B COMPLEX PO) Take by mouth.      . calcium carbonate (OS-CAL) 600 MG TABS Take 600 mg by mouth daily.        . diphenhydrAMINE (BENADRYL) 25 MG tablet Take 25 mg by mouth at bedtime as needed.        Marland Kitchen esomeprazole (NEXIUM) 40 MG capsule Take 1 capsule (40 mg total) by mouth daily.  90 capsule  3  . fish oil-omega-3 fatty acids 1000 MG capsule Take 2 g by mouth daily.        . fluticasone (FLONASE) 50 MCG/ACT nasal spray Place 2 sprays into the nose daily.        Marland Kitchen  HYDROcodone-acetaminophen (NORCO) 5-325 MG per tablet Take 1 tablet by mouth every 6 (six) hours as needed.        Marland Kitchen losartan-hydrochlorothiazide (HYZAAR) 100-25 MG per tablet Take 1 tablet by mouth daily.  90 tablet  3  . methocarbamol (ROBAXIN) 500 MG tablet TAKE 1 TABLET TWICE A DAY AS NEEDED SPASMS  180 tablet  0  . metoprolol (LOPRESSOR) 50 MG tablet Take 25 mg by mouth 2 (two) times daily.      . Multiple Vitamin (MULTIVITAMIN) tablet Take 1 tablet by mouth daily.        . sertraline (ZOLOFT) 100 MG tablet Take 1 tablet (100 mg total) by mouth daily.  90 tablet  3  . spironolactone (ALDACTONE) 25 MG tablet Take 25 mg by mouth daily.      Marland Kitchen tolterodine (DETROL) 2 MG tablet Take 2 mg by mouth 2 (two) times daily.      Marland Kitchen dexlansoprazole (DEXILANT) 60 MG capsule Take 1 capsule (60 mg total) by mouth daily.  21 capsule  0  . nitrofurantoin (MACRODANTIN) 100 MG capsule Take 100 mg by mouth 4 (four) times daily.      . pantoprazole (PROTONIX) 40 MG tablet Take 1 tablet (40 mg total) by mouth daily.  30 tablet  3   No facility-administered encounter medications on file as of 07/26/2012.     Orders Placed This Encounter  Procedures  . Ambulatory referral to Cardiology    Return in about 3 months (around 10/25/2012).

## 2012-07-26 NOTE — Assessment & Plan Note (Signed)
Still having episodes of presyncope last 3 to 5 secnods that did not occure when she was wearing the holter monitor

## 2012-07-26 NOTE — Assessment & Plan Note (Addendum)
I'm referring her back to Dr. Philemon Kingdom office for 30 day evaluation of heart rhythms with CardioNet given her recurrent episodes of presyncope and history of long QT syndrome.

## 2012-07-26 NOTE — Assessment & Plan Note (Signed)
She is intolerant of statins. She is at managing her hyperlipidemia with diet alone. Triglycerides have been mildly elevated. Should improve with exercise and weight loss.

## 2012-07-26 NOTE — Assessment & Plan Note (Signed)
Well-controlled on diet alone. Hemoglobin A1c is less than 7. She is on the appropriate medications. She is due for an eye exam. Foot exam was normal today except for some loss of sensation on the bottom of her left foot which was due to prior back surgery.

## 2012-07-28 DIAGNOSIS — R55 Syncope and collapse: Secondary | ICD-10-CM | POA: Diagnosis not present

## 2012-07-28 DIAGNOSIS — I119 Hypertensive heart disease without heart failure: Secondary | ICD-10-CM | POA: Diagnosis not present

## 2012-07-28 DIAGNOSIS — I059 Rheumatic mitral valve disease, unspecified: Secondary | ICD-10-CM | POA: Diagnosis not present

## 2012-07-28 DIAGNOSIS — I519 Heart disease, unspecified: Secondary | ICD-10-CM | POA: Diagnosis not present

## 2012-08-05 DIAGNOSIS — I495 Sick sinus syndrome: Secondary | ICD-10-CM | POA: Diagnosis not present

## 2012-08-05 DIAGNOSIS — I447 Left bundle-branch block, unspecified: Secondary | ICD-10-CM | POA: Diagnosis not present

## 2012-08-05 DIAGNOSIS — E782 Mixed hyperlipidemia: Secondary | ICD-10-CM | POA: Diagnosis not present

## 2012-08-05 DIAGNOSIS — R42 Dizziness and giddiness: Secondary | ICD-10-CM | POA: Diagnosis not present

## 2012-08-10 ENCOUNTER — Ambulatory Visit: Payer: Self-pay | Admitting: Pain Medicine

## 2012-09-01 ENCOUNTER — Encounter: Payer: Self-pay | Admitting: Internal Medicine

## 2012-09-02 NOTE — Telephone Encounter (Signed)
Called patient sated feeling better at this time advised patient still appropriate to see MD for tick bite and S\S. Appointment scheduled for 5/23

## 2012-09-02 NOTE — Telephone Encounter (Signed)
Called to verify how patient is feeling today left message on cell phone voicemail for patient to call office.

## 2012-09-09 ENCOUNTER — Ambulatory Visit: Payer: Self-pay | Admitting: Pain Medicine

## 2012-09-10 ENCOUNTER — Encounter: Payer: Self-pay | Admitting: Internal Medicine

## 2012-09-10 ENCOUNTER — Ambulatory Visit: Payer: BC Managed Care – PPO

## 2012-09-10 ENCOUNTER — Ambulatory Visit (INDEPENDENT_AMBULATORY_CARE_PROVIDER_SITE_OTHER): Payer: BC Managed Care – PPO | Admitting: Internal Medicine

## 2012-09-10 VITALS — BP 128/76 | HR 76 | Temp 98.2°F | Resp 16

## 2012-09-10 DIAGNOSIS — A6920 Lyme disease, unspecified: Secondary | ICD-10-CM

## 2012-09-10 DIAGNOSIS — S90569A Insect bite (nonvenomous), unspecified ankle, initial encounter: Secondary | ICD-10-CM

## 2012-09-10 DIAGNOSIS — W57XXXA Bitten or stung by nonvenomous insect and other nonvenomous arthropods, initial encounter: Secondary | ICD-10-CM

## 2012-09-10 DIAGNOSIS — S70362A Insect bite (nonvenomous), left thigh, initial encounter: Secondary | ICD-10-CM

## 2012-09-10 DIAGNOSIS — S70269A Insect bite (nonvenomous), unspecified hip, initial encounter: Secondary | ICD-10-CM

## 2012-09-10 LAB — CBC WITH DIFFERENTIAL/PLATELET
Basophils Absolute: 0.1 10*3/uL (ref 0.0–0.1)
Eosinophils Relative: 2 % (ref 0–5)
HCT: 37.2 % (ref 36.0–46.0)
Lymphocytes Relative: 38 % (ref 12–46)
MCH: 28.3 pg (ref 26.0–34.0)
MCHC: 34.7 g/dL (ref 30.0–36.0)
MCV: 81.6 fL (ref 78.0–100.0)
Monocytes Absolute: 0.5 10*3/uL (ref 0.1–1.0)
RDW: 14 % (ref 11.5–15.5)
WBC: 6.8 10*3/uL (ref 4.0–10.5)

## 2012-09-10 LAB — COMPREHENSIVE METABOLIC PANEL
ALT: 21 U/L (ref 0–35)
AST: 22 U/L (ref 0–37)
Alkaline Phosphatase: 73 U/L (ref 39–117)
Sodium: 138 mEq/L (ref 135–145)
Total Bilirubin: 0.6 mg/dL (ref 0.3–1.2)
Total Protein: 7.1 g/dL (ref 6.0–8.3)

## 2012-09-10 MED ORDER — METOPROLOL TARTRATE 25 MG PO TABS: 25.0000 mg | ORAL_TABLET | Freq: Two times a day (BID) | ORAL | Status: DC

## 2012-09-10 MED ORDER — LOSARTAN POTASSIUM-HCTZ 50-12.5 MG PO TABS: 1.0000 | ORAL_TABLET | Freq: Every day | ORAL | Status: DC

## 2012-09-10 NOTE — Patient Instructions (Addendum)
  Your tick bite looks fine.  I see no signs of RMSF but I am checking labs today that may suggest it  And if they are abnormal I will call in doxycycline to take twcie daily  For 10 days

## 2012-09-10 NOTE — Progress Notes (Signed)
Patient ID: Julia Pierce, female   DOB: [birth year], 57 y.o.   MRN: [id number]   Patient Active Problem List   Diagnosis Date Noted  . Tick bite of hip 09/13/2012  . Statin intolerance 03/03/2012  . Reflux esophagitis 12/31/2011  . Diabetes mellitus type 2, diet-controlled 09/27/2011  . Obesity (BMI 30-39.9) 05/12/2011  . Morton's neuroma   . Valvular cardiomyopathy 03/31/2011  . Hyperlipidemia LDL goal < 100 03/31/2011  . Long QT syndrome 02/05/2011  . Hypertension   . Depression   . Holter monitor, abnormal     Subjective:  CC:   Chief Complaint  Patient presents with  . Acute Visit    Tick removed X 11 days    HPI:   Julia Pierce a 57 y.o. female who presents For evaluation of Tick bite.  She found an engorged tick on her left hip on May 13th  (10 days ago )  .  Not sure how long it had been there.  The evening prior to its removal she remembers having a headache, nausea with emesis, chills without documented fever, and cervical lymphadenopathy.  Daughter who is an Charity fundraiser suggested she seek medical attention.  Her symptoms resolved after 24 hours and she did not seek more urgent evaluation since she was feeling better .    Past Medical History  Diagnosis Date  . Arthritis   . Chicken pox   . Headache, frequent episodic tension-type   . Cardiac arrhythmia due to congenital heart disease   . History of high blood pressure     readings  . High cholesterol   . Migraines   . Hx of colonoscopy sept 2012    normal,  next due 2022, [name]  . Hypertension   . Depression   . Holter monitor, abnormal August 2012    done for long QT.  some contractile asynchrony Gwen Pounds)  . Morton's neuroma     bilateral    Past Surgical History  Procedure Laterality Date  . Appendectomy  1973  . Tonsillectomy and adenoidectomy  1966  . Spinal fusion  03/2007    ruptured disc  L5  [name]  . Hysteroscopy  2005    heavy bleeding  . Abdominal hysterectomy  Dec 2013    [place]        The following portions of the patient's history were reviewed and updated as appropriate: Allergies, current medications, and problem list.    Review of Systems:   Patient denies headache, fevers, malaise, unintentional weight loss, skin rash, eye pain, sinus congestion and sinus pain, sore throat, dysphagia,  hemoptysis , cough, dyspnea, wheezing, chest pain, palpitations, orthopnea, edema, abdominal pain, nausea, melena, diarrhea, constipation, flank pain, dysuria, hematuria, urinary  Frequency, nocturia, numbness, tingling, seizures,  Focal weakness, Loss of consciousness,  Tremor, insomnia, depression, anxiety, and suicidal ideation.     History   Social History  . Marital Status: Married    Spouse Name: N/A    Number of Children: N/A  . Years of Education: N/A   Occupational History  . Not on file.   Social History Main Topics  . Smoking status: Never Smoker   . Smokeless tobacco: Never Used  . Alcohol Use: No  . Drug Use: No  . Sexually Active: Not on file   Other Topics Concern  . Not on file   Social History Narrative  . No narrative on file    Objective:  BP 128/76  Pulse 76  Temp(Src) 98.2 F (  36.8 C) (Oral)  Resp 16  SpO2 99%  General appearance: alert, cooperative and appears stated age Ears: normal TM's and external ear canals both ears Throat: lips, mucosa, and tongue normal; teeth and gums normal Neck: no adenopathy, no carotid bruit, supple, symmetrical, trachea midline and thyroid not enlarged, symmetric, no tenderness/mass/nodules Back: symmetric, no curvature. ROM normal. No CVA tenderness. Lungs: clear to auscultation bilaterally Heart: regular rate and rhythm, S1, S2 normal, no murmur, click, rub or gallop Abdomen: soft, non-tender; bowel sounds normal; no masses,  no organomegaly Pulses: 2+ and symmetric Skin: Skin color, texture, turgor normal. No rashes or lesions Lymph nodes: Cervical, supraclavicular, and axillary nodes  normal.  Assessment and Plan:  Tick bite of hip She has no signs of infection,  And labs are normal as well (no transaminitis, thrombocytopenia, or hyponatremia).  No need for antibiotics at this time, 10 days out from bite.    Updated Medication List Outpatient Encounter Prescriptions as of 09/10/2012  Medication Sig Dispense Refill  . B Complex Vitamins (B COMPLEX PO) Take by mouth.      . calcium carbonate (OS-CAL) 600 MG TABS Take 600 mg by mouth daily.        . diphenhydrAMINE (BENADRYL) 25 MG tablet Take 25 mg by mouth at bedtime as needed.        Marland Kitchen esomeprazole (NEXIUM) 40 MG capsule Take 1 capsule (40 mg total) by mouth daily.  90 capsule  3  . fish oil-omega-3 fatty acids 1000 MG capsule Take 2 g by mouth daily.        . fluticasone (FLONASE) 50 MCG/ACT nasal spray Place 2 sprays into the nose daily.        Marland Kitchen HYDROcodone-acetaminophen (NORCO) 5-325 MG per tablet Take 1 tablet by mouth every 6 (six) hours as needed.        . methocarbamol (ROBAXIN) 500 MG tablet TAKE 1 TABLET TWICE A DAY AS NEEDED SPASMS  180 tablet  0  . Multiple Vitamin (MULTIVITAMIN) tablet Take 1 tablet by mouth daily.        . nitrofurantoin (MACRODANTIN) 100 MG capsule Take 100 mg by mouth 4 (four) times daily.      . pantoprazole (PROTONIX) 40 MG tablet Take 1 tablet (40 mg total) by mouth daily.  30 tablet  3  . sertraline (ZOLOFT) 100 MG tablet Take 1 tablet (100 mg total) by mouth daily.  90 tablet  3  . spironolactone (ALDACTONE) 25 MG tablet Take 25 mg by mouth daily.      Marland Kitchen tolterodine (DETROL) 2 MG tablet Take 2 mg by mouth 2 (two) times daily.      . [DISCONTINUED] losartan-hydrochlorothiazide (HYZAAR) 100-25 MG per tablet Take 1 tablet by mouth daily.  90 tablet  3  . [DISCONTINUED] metoprolol (LOPRESSOR) 50 MG tablet Take 25 mg by mouth 2 (two) times daily.      Marland Kitchen dexlansoprazole (DEXILANT) 60 MG capsule Take 1 capsule (60 mg total) by mouth daily.  21 capsule  0  . losartan-hydrochlorothiazide  (HYZAAR) 50-12.5 MG per tablet Take 1 tablet by mouth daily.  90 tablet  3  . metoprolol tartrate (LOPRESSOR) 25 MG tablet Take 1 tablet (25 mg total) by mouth 2 (two) times daily.  180 tablet  3   No facility-administered encounter medications on file as of 09/10/2012.     Orders Placed This Encounter  Procedures  . Comprehensive metabolic panel  . CBC with Differential    No Follow-up on  file.

## 2012-09-13 ENCOUNTER — Encounter: Payer: Self-pay | Admitting: Internal Medicine

## 2012-09-13 DIAGNOSIS — W57XXXA Bitten or stung by nonvenomous insect and other nonvenomous arthropods, initial encounter: Secondary | ICD-10-CM | POA: Insufficient documentation

## 2012-09-13 DIAGNOSIS — S70269A Insect bite (nonvenomous), unspecified hip, initial encounter: Secondary | ICD-10-CM | POA: Insufficient documentation

## 2012-09-13 NOTE — Assessment & Plan Note (Signed)
She has no signs of infection,  And labs are normal as well (no transaminitis, thrombocytopenia, or hyponatremia).  No need for antibiotics at this time, 10 days out from bite.

## 2012-09-14 ENCOUNTER — Encounter: Payer: Self-pay | Admitting: Internal Medicine

## 2012-09-14 ENCOUNTER — Telehealth: Payer: Self-pay | Admitting: *Deleted

## 2012-09-14 NOTE — Telephone Encounter (Signed)
Pt's CBC results are in

## 2012-09-14 NOTE — Addendum Note (Signed)
Addended by: Montine Circle D on: 09/14/2012 10:35 AM   Modules accepted: Orders

## 2012-09-14 NOTE — Telephone Encounter (Deleted)
Pt's CBC results are in from [facility]

## 2012-09-17 ENCOUNTER — Other Ambulatory Visit: Payer: Self-pay | Admitting: Internal Medicine

## 2012-10-05 DIAGNOSIS — I10 Essential (primary) hypertension: Secondary | ICD-10-CM | POA: Diagnosis not present

## 2012-10-05 DIAGNOSIS — I471 Supraventricular tachycardia: Secondary | ICD-10-CM | POA: Diagnosis not present

## 2012-10-05 DIAGNOSIS — I447 Left bundle-branch block, unspecified: Secondary | ICD-10-CM | POA: Diagnosis not present

## 2012-10-06 ENCOUNTER — Ambulatory Visit: Payer: Self-pay | Admitting: Pain Medicine

## 2012-10-06 DIAGNOSIS — M5137 Other intervertebral disc degeneration, lumbosacral region: Secondary | ICD-10-CM | POA: Diagnosis not present

## 2012-10-06 DIAGNOSIS — M79609 Pain in unspecified limb: Secondary | ICD-10-CM | POA: Diagnosis not present

## 2012-10-06 DIAGNOSIS — M545 Low back pain, unspecified: Secondary | ICD-10-CM | POA: Diagnosis not present

## 2012-10-06 DIAGNOSIS — Z79899 Other long term (current) drug therapy: Secondary | ICD-10-CM | POA: Diagnosis not present

## 2012-10-06 DIAGNOSIS — M542 Cervicalgia: Secondary | ICD-10-CM | POA: Diagnosis not present

## 2012-10-06 DIAGNOSIS — M4802 Spinal stenosis, cervical region: Secondary | ICD-10-CM | POA: Diagnosis not present

## 2012-10-06 DIAGNOSIS — M5030 Other cervical disc degeneration, unspecified cervical region: Secondary | ICD-10-CM | POA: Diagnosis not present

## 2012-10-18 ENCOUNTER — Ambulatory Visit: Payer: Self-pay | Admitting: Pain Medicine

## 2012-10-18 DIAGNOSIS — M5030 Other cervical disc degeneration, unspecified cervical region: Secondary | ICD-10-CM | POA: Diagnosis not present

## 2012-10-18 DIAGNOSIS — M5020 Other cervical disc displacement, unspecified cervical region: Secondary | ICD-10-CM | POA: Diagnosis not present

## 2012-10-19 ENCOUNTER — Other Ambulatory Visit (INDEPENDENT_AMBULATORY_CARE_PROVIDER_SITE_OTHER): Payer: BC Managed Care – PPO

## 2012-10-19 DIAGNOSIS — S90569A Insect bite (nonvenomous), unspecified ankle, initial encounter: Secondary | ICD-10-CM

## 2012-10-19 DIAGNOSIS — W57XXXA Bitten or stung by nonvenomous insect and other nonvenomous arthropods, initial encounter: Secondary | ICD-10-CM | POA: Diagnosis not present

## 2012-10-19 DIAGNOSIS — A6920 Lyme disease, unspecified: Secondary | ICD-10-CM | POA: Diagnosis not present

## 2012-10-20 LAB — LYME AB, TOTAL/IGM RESPONSES
Lyme Ab: <0.91 index (ref 0.00–0.90)
Lyme Disease Ab, Quant, IgM: <0.91 index (ref 0.00–0.90)

## 2012-10-23 LAB — EHRLICHIA ANTIBODY PANEL: E chaffeensis (HGE) Ab, IgM: <1:20 {titer}

## 2012-10-24 ENCOUNTER — Telehealth: Payer: Self-pay | Admitting: Internal Medicine

## 2012-10-27 ENCOUNTER — Encounter: Payer: Self-pay | Admitting: Internal Medicine

## 2012-10-27 ENCOUNTER — Ambulatory Visit (INDEPENDENT_AMBULATORY_CARE_PROVIDER_SITE_OTHER): Payer: BC Managed Care – PPO | Admitting: Internal Medicine

## 2012-10-27 VITALS — BP 138/84 | HR 72 | Temp 98.1°F | Resp 16 | Wt 173.0 lb

## 2012-10-27 DIAGNOSIS — W57XXXA Bitten or stung by nonvenomous insect and other nonvenomous arthropods, initial encounter: Secondary | ICD-10-CM

## 2012-10-27 DIAGNOSIS — E785 Hyperlipidemia, unspecified: Secondary | ICD-10-CM | POA: Diagnosis not present

## 2012-10-27 DIAGNOSIS — S70269A Insect bite (nonvenomous), unspecified hip, initial encounter: Secondary | ICD-10-CM

## 2012-10-27 DIAGNOSIS — I10 Essential (primary) hypertension: Secondary | ICD-10-CM | POA: Diagnosis not present

## 2012-10-27 DIAGNOSIS — E119 Type 2 diabetes mellitus without complications: Secondary | ICD-10-CM

## 2012-10-27 DIAGNOSIS — S90569A Insect bite (nonvenomous), unspecified ankle, initial encounter: Secondary | ICD-10-CM

## 2012-10-27 LAB — COMPREHENSIVE METABOLIC PANEL
ALT: 20 U/L (ref 0–35)
AST: 22 U/L (ref 0–37)
Albumin: 4 g/dL (ref 3.5–5.2)
BUN: 16 mg/dL (ref 6–23)
Calcium: 9.5 mg/dL (ref 8.4–10.5)
Chloride: 105 mEq/L (ref 96–112)
Potassium: 4.2 mEq/L (ref 3.5–5.1)
Sodium: 141 mEq/L (ref 135–145)
Total Protein: 6.8 g/dL (ref 6.0–8.3)

## 2012-10-27 LAB — HEMOGLOBIN A1C: Hgb A1c MFr Bld: 6.2 % (ref 4.6–6.5)

## 2012-10-27 MED ORDER — LOSARTAN POTASSIUM 100 MG PO TABS: 100.0000 mg | ORAL_TABLET | Freq: Every day | ORAL | Status: DC

## 2012-10-27 MED ORDER — HYDROCHLOROTHIAZIDE 25 MG PO TABS: 25.0000 mg | ORAL_TABLET | Freq: Every day | ORAL | Status: DC

## 2012-10-27 NOTE — Patient Instructions (Addendum)
i am increasing your diuretic dose today with an extra HCTZ tablet.  We will refill your next losartan without the HTCZ  If your A1c has increased to > 6.5 this time ,  I will recommend starting metformin for the diabetes.

## 2012-10-27 NOTE — Progress Notes (Signed)
Patient ID: Julia Pierce, female   DOB: 1956-03-16, 57 y.o.   MRN: [id number]  Patient Active Problem List   Diagnosis Date Noted  . Tick bite of hip 09/13/2012  . Statin intolerance 03/03/2012  . Reflux esophagitis 12/31/2011  . Diabetes mellitus type 2, diet-controlled 09/27/2011  . Obesity (BMI 30-39.9) 05/12/2011  . Morton's neuroma   . Valvular cardiomyopathy 03/31/2011  . Hyperlipidemia LDL goal < 100 03/31/2011  . Long QT syndrome 02/05/2011  . Hypertension   . Depression   . Holter monitor, abnormal     Subjective:  CC:   Chief Complaint  Patient presents with  . Follow-up    3 month   HPI:   Julia Pierce a 57 y.o. female who presents for fDM follow up.  She has noting that her fasting sugars have been in the  130's , and no lows.  She is swimming regularly.  Has been making changes to diet with low carb breads.      Past Medical History  Diagnosis Date  . Arthritis   . Chicken pox   . Headache, frequent episodic tension-type   . Cardiac arrhythmia due to congenital heart disease   . History of high blood pressure     readings  . High cholesterol   . Migraines   . Hx of colonoscopy sept 2012    normal,  next due 2022, Julia Pierce  . Hypertension   . Depression   . Holter monitor, abnormal August 2012    done for long QT.  some contractile asynchrony Julia Pierce)  . Morton's neuroma     bilateral    Past Surgical History  Procedure Laterality Date  . Appendectomy  1973  . Tonsillectomy and adenoidectomy  1966  . Spinal fusion  03/2007    ruptured disc  L5  Julia Pierce  . Hysteroscopy  2005    heavy bleeding  . Abdominal hysterectomy  Dec 2013    Julia Pierce       The following portions of the patient's history were reviewed and updated as appropriate: Allergies, current medications, and problem list.    Review of Systems:   12 Pt  review of systems was negative except those addressed in the HPI,     History   Social History  . Marital  Status: Married    Spouse Name: N/A    Number of Children: N/A  . Years of Education: N/A   Occupational History  . Not on file.   Social History Main Topics  . Smoking status: Never Smoker   . Smokeless tobacco: Never Used  . Alcohol Use: No  . Drug Use: No  . Sexually Active: Not on file   Other Topics Concern  . Not on file   Social History Narrative  . No narrative on file    Objective:  BP 138/84  Pulse 72  Temp(Src) 98.1 F (36.7 C) (Oral)  Resp 16  Wt 173 lb (78.472 kg)  BMI 31.12 kg/m2  SpO2 99%  General appearance: alert, cooperative and appears stated age Ears: normal TM's and external ear canals both ears Throat: lips, mucosa, and tongue normal; teeth and gums normal Neck: no adenopathy, no carotid bruit, supple, symmetrical, trachea midline and thyroid not enlarged, symmetric, no tenderness/mass/nodules Back: symmetric, no curvature. ROM normal. No CVA tenderness. Lungs: clear to auscultation bilaterally Heart: regular rate and rhythm, S1, S2 normal, no murmur, click, rub or gallop Abdomen: soft, non-tender; bowel sounds normal; no masses,  no  organomegaly Pulses: 2+ and symmetric Skin: Skin color, texture, turgor normal. No rashes or lesions Lymph nodes: Cervical, supraclavicular, and axillary nodes normal. Foot exam:  Nails are well trimmed,  No callouses,  Sensation intact to microfilament  Assessment and Plan:  Hypertension Having increased edema, will increase HCTZ to 25 mg daily   Hyperlipidemia LDL goal < 100 trigs 235, LDL 184.  untreated secondary to statin intolerance.   Tick bite of hip Antibodies to RMSF, Ehrlichia, and Lymes disease were negative   Diabetes mellitus type 2, diet-controlled Well-controlled on diet alone .  hemoglobin A1c has been consistently less than 7.0 . She is up-to-date on eye exams and her foot exam is norma. l we'll repeat his urine microalbumin to creatinine ratio at next visit. She is on the appropriate  medications.   Updated Medication List Outpatient Encounter Prescriptions as of 10/27/2012  Medication Sig Dispense Refill  . Azelaic Acid (FINACEA EX) Apply 1 application topically 2 (two) times daily.      . B Complex Vitamins (B COMPLEX PO) Take by mouth.      . diphenhydrAMINE (BENADRYL) 25 MG tablet Take 25 mg by mouth at bedtime as needed.        Marland Kitchen esomeprazole (NEXIUM) 40 MG capsule Take 1 capsule (40 mg total) by mouth daily.  90 capsule  3  . fish oil-omega-3 fatty acids 1000 MG capsule Take 2 g by mouth daily.        . fluticasone (FLONASE) 50 MCG/ACT nasal spray Place 2 sprays into the nose daily.        Marland Kitchen HYDROcodone-acetaminophen (NORCO) 5-325 MG per tablet Take 1 tablet by mouth every 6 (six) hours as needed.        . methocarbamol (ROBAXIN) 500 MG tablet TAKE 1 TABLET TWICE A DAY AS NEEDED SPASMS  180 tablet  0  . metoprolol tartrate (LOPRESSOR) 25 MG tablet Take 1 tablet (25 mg total) by mouth 2 (two) times daily.  180 tablet  3  . Multiple Vitamin (MULTIVITAMIN) tablet Take 1 tablet by mouth daily.        Marland Kitchen spironolactone (ALDACTONE) 25 MG tablet Take 25 mg by mouth daily.      Marland Kitchen tolterodine (DETROL) 2 MG tablet Take 2 mg by mouth 2 (two) times daily.      . [DISCONTINUED] losartan-hydrochlorothiazide (HYZAAR) 50-12.5 MG per tablet Take 1 tablet by mouth daily.  90 tablet  3  . calcium carbonate (OS-CAL) 600 MG TABS Take 600 mg by mouth daily.        Marland Kitchen dexlansoprazole (DEXILANT) 60 MG capsule Take 1 capsule (60 mg total) by mouth daily.  21 capsule  0  . hydrochlorothiazide (HYDRODIURIL) 25 MG tablet Take 1 tablet (25 mg total) by mouth daily.  90 tablet  3  . nitrofurantoin (MACRODANTIN) 100 MG capsule Take 100 mg by mouth 4 (four) times daily.      . [DISCONTINUED] losartan (COZAAR) 100 MG tablet Take 1 tablet (100 mg total) by mouth daily.  90 tablet  3  . [DISCONTINUED] pantoprazole (PROTONIX) 40 MG tablet Take 1 tablet (40 mg total) by mouth daily.  30 tablet  3   No  facility-administered encounter medications on file as of 10/27/2012.     Orders Placed This Encounter  Procedures  . Hemoglobin A1c  . Comprehensive metabolic panel    No Follow-up on file.

## 2012-10-28 ENCOUNTER — Encounter: Payer: Self-pay | Admitting: Internal Medicine

## 2012-10-28 ENCOUNTER — Telehealth: Payer: Self-pay | Admitting: *Deleted

## 2012-10-28 ENCOUNTER — Other Ambulatory Visit: Payer: Self-pay | Admitting: Internal Medicine

## 2012-10-28 MED ORDER — LOSARTAN POTASSIUM 50 MG PO TABS: 50.0000 mg | ORAL_TABLET | Freq: Every day | ORAL | Status: DC

## 2012-10-28 NOTE — Telephone Encounter (Signed)
Pharmacy states they received script for losartan 100 mg and patient stated that you increased the HCTZ and not losartan that the losartan is 50 mg in note to patient on AVS states you increased HCTZ and script for losartan on MAR is increased to 100 mg, please advise is patient to take Losartan 100 mg and HCTZ 25 mg.

## 2012-10-28 NOTE — Telephone Encounter (Signed)
Spoke with pharmacist and clarified Losartan dose should be 50 mg and to discontinue the Rx for 100 mg.

## 2012-10-28 NOTE — Assessment & Plan Note (Signed)
Antibodies to RMSF, Ehrlichia, and Lymes disease were negative

## 2012-10-28 NOTE — Assessment & Plan Note (Signed)
Having increased edema, will increase HCTZ to 25 mg daily

## 2012-10-28 NOTE — Telephone Encounter (Signed)
My mistake,  She should be taking losartan 50 mg and hctz 25 mg .  New losartan rx for 50 mg sent to pharmacy

## 2012-10-28 NOTE — Assessment & Plan Note (Addendum)
trigs 235, LDL 184.  untreated secondary to statin intolerance.

## 2012-10-28 NOTE — Assessment & Plan Note (Signed)
Well-controlled on diet alone .  hemoglobin A1c has been consistently less than 7.0 . She is up-to-date on eye exams and her foot exam is norma. l we'll repeat his urine microalbumin to creatinine ratio at next visit. She is on the appropriate medications.

## 2012-11-08 ENCOUNTER — Ambulatory Visit: Payer: Self-pay | Admitting: Pain Medicine

## 2012-11-08 DIAGNOSIS — M62838 Other muscle spasm: Secondary | ICD-10-CM | POA: Diagnosis not present

## 2012-11-08 DIAGNOSIS — M47812 Spondylosis without myelopathy or radiculopathy, cervical region: Secondary | ICD-10-CM | POA: Diagnosis not present

## 2012-11-08 DIAGNOSIS — Z79899 Other long term (current) drug therapy: Secondary | ICD-10-CM | POA: Diagnosis not present

## 2012-11-08 DIAGNOSIS — M5137 Other intervertebral disc degeneration, lumbosacral region: Secondary | ICD-10-CM | POA: Diagnosis not present

## 2012-11-08 DIAGNOSIS — M5030 Other cervical disc degeneration, unspecified cervical region: Secondary | ICD-10-CM | POA: Diagnosis not present

## 2012-12-04 IMAGING — CR DG KNEE COMPLETE 4+V*R*
1 series · 4 of 4 positions shown · non-contrast
Comparison: none

REASON FOR EXAM: pain
COMMENTS:

PROCEDURE:     DXR - DXR KNEE RT COMP WITH OBLIQUES  - June 11, 2010 [DATE]
RESULT:     Comparison: None.

[Series 1: view not recorded · 0.17mm/px · 4 of 4 slices shown]
[im 1/4]
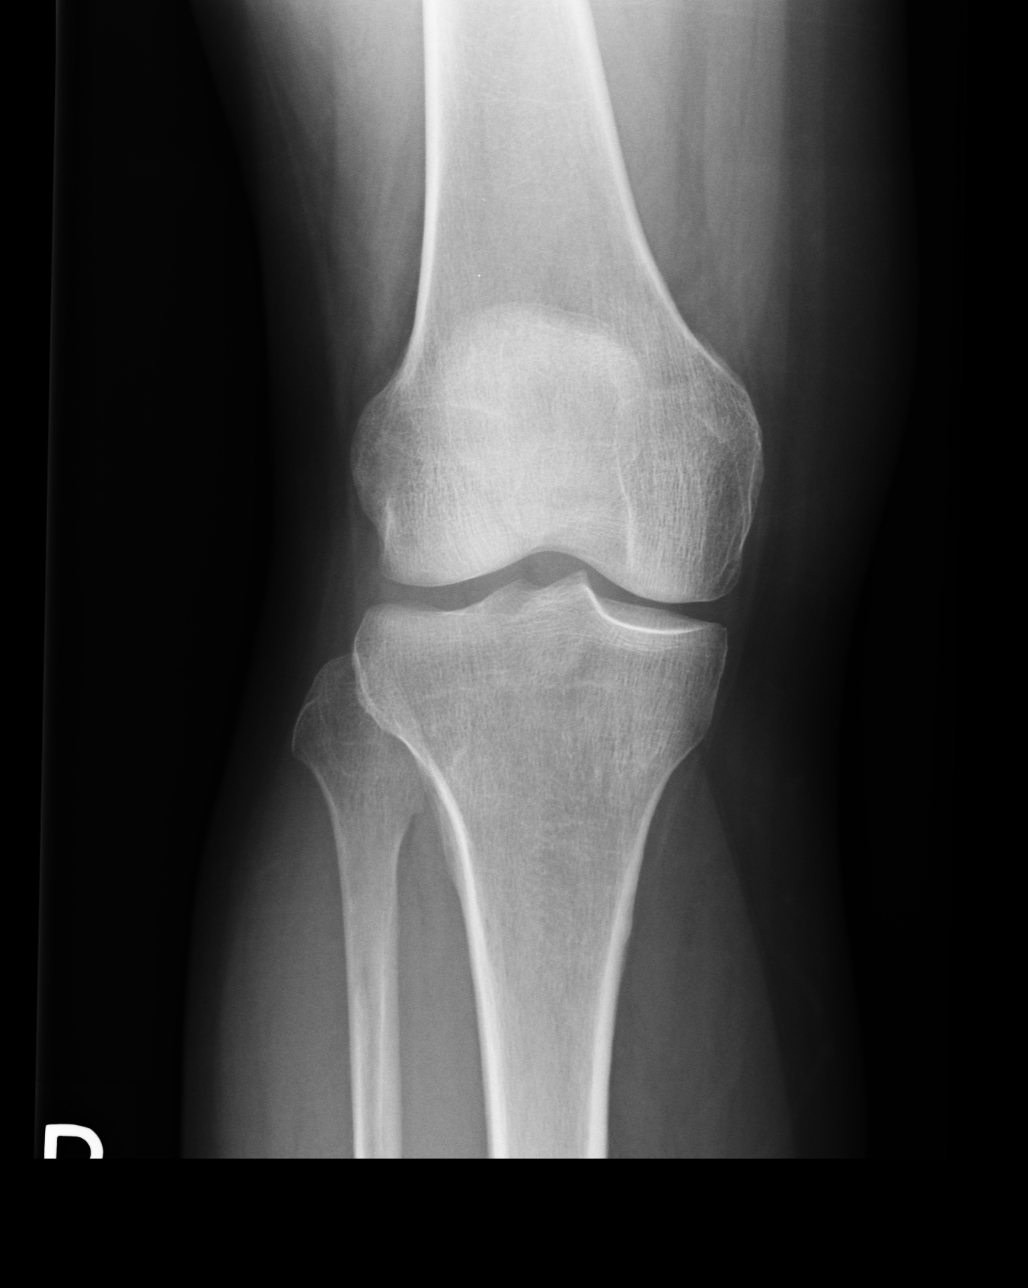
[im 2/4]
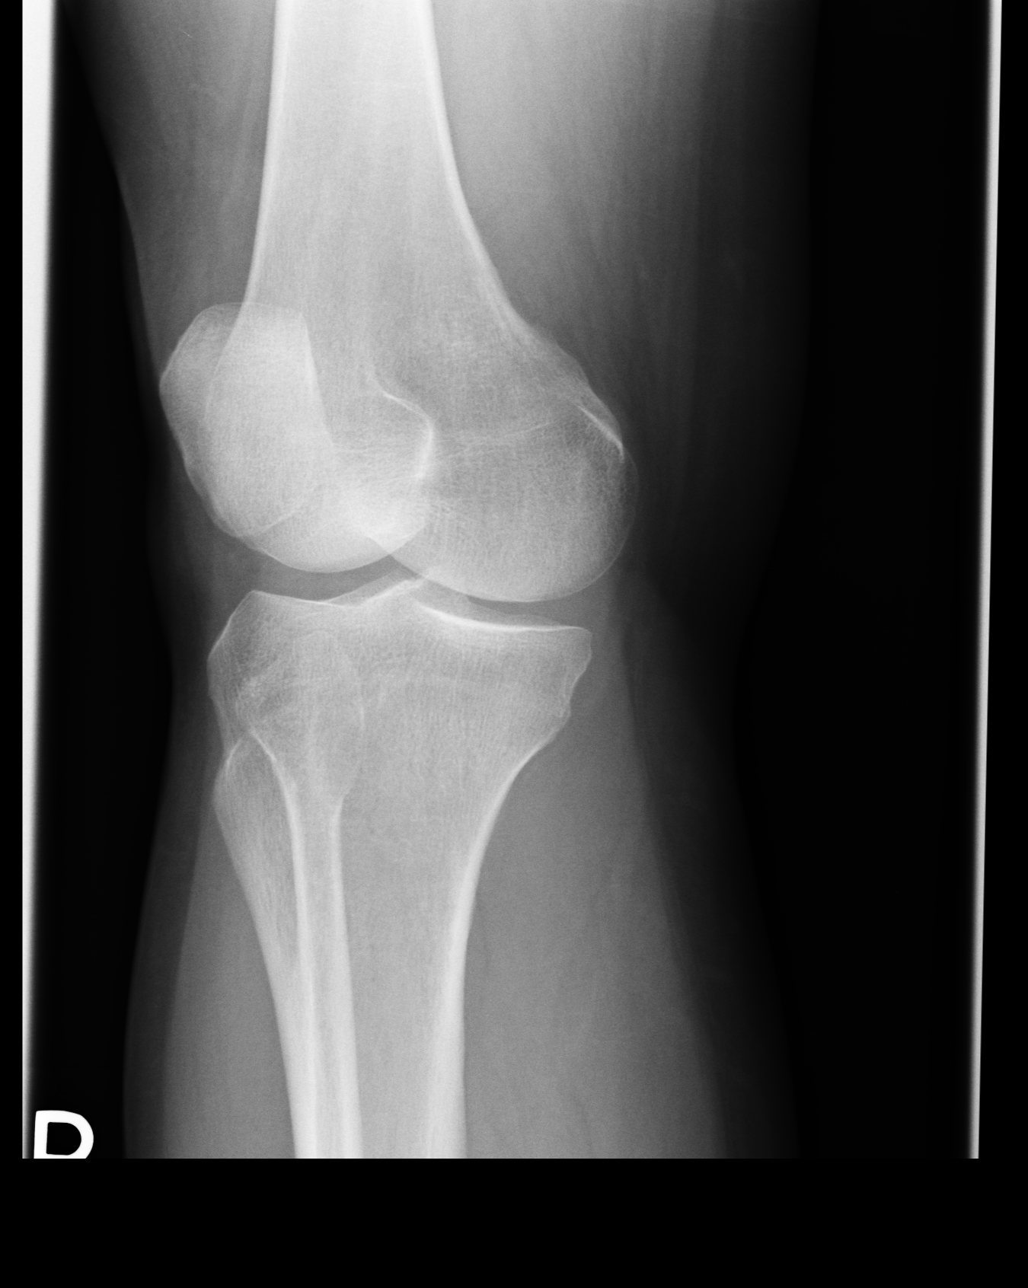
[im 3/4]
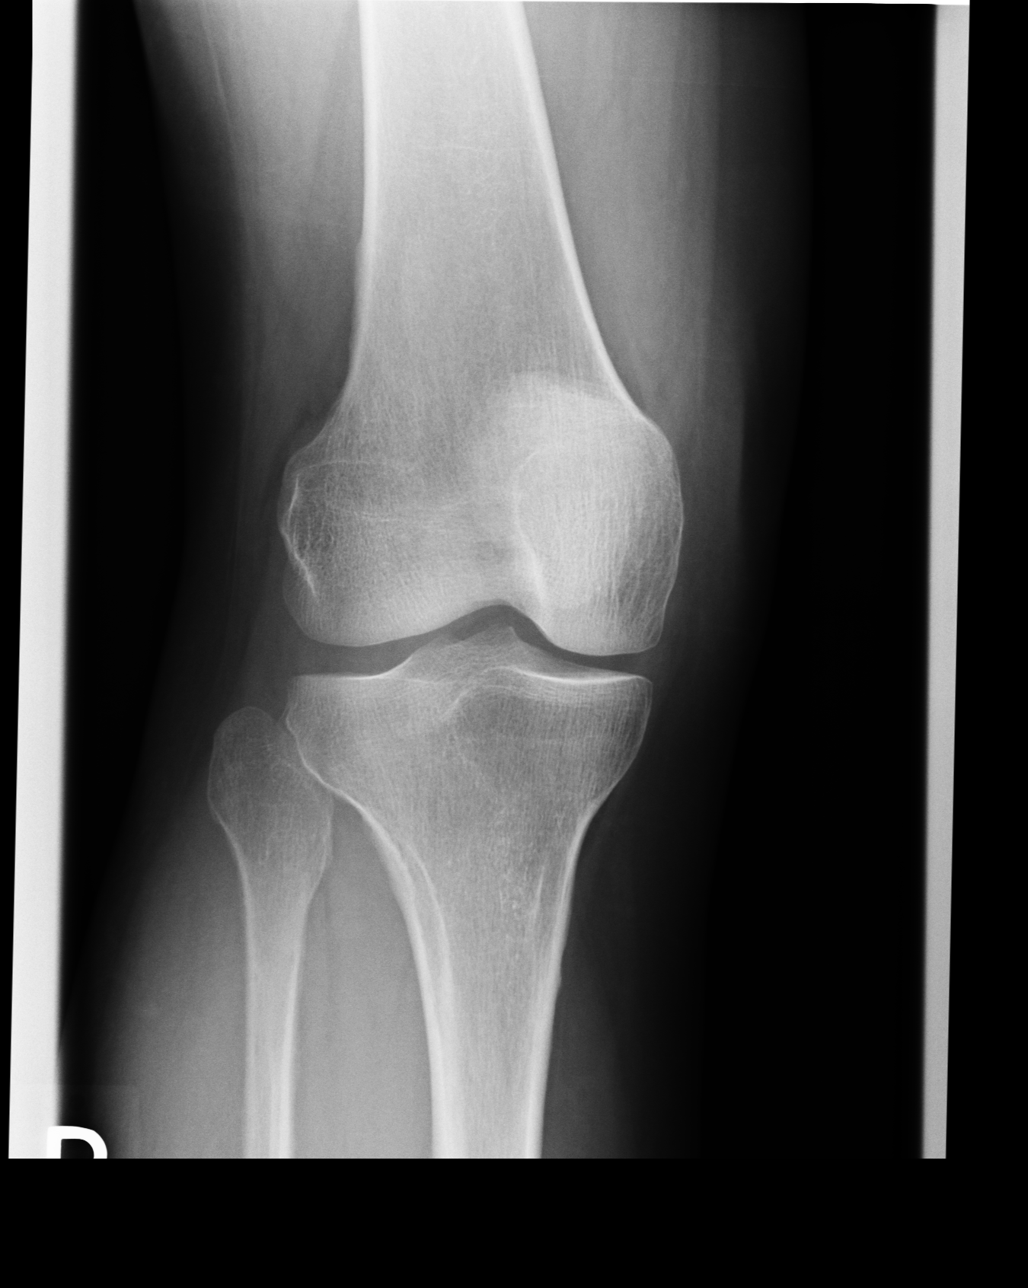
[im 4/4]
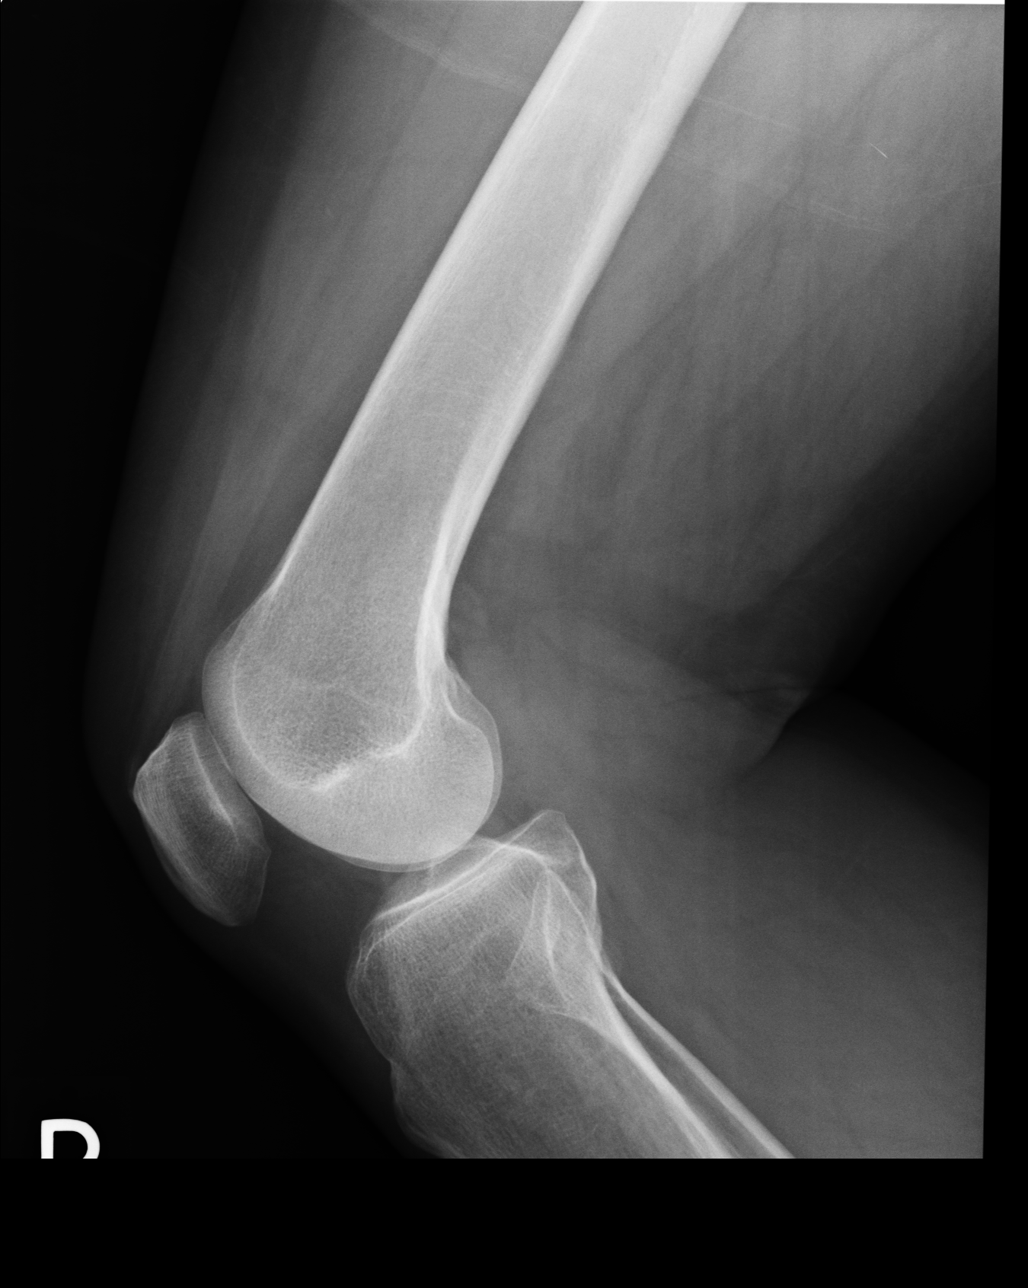

[4 of 4 positions shown; findings below may reference images not displayed]

FINDINGS: No acute fracture. Joint spaces are maintained. Negative for effusion.
IMPRESSION: Unremarkable right knee radiographs.

## 2012-12-06 DIAGNOSIS — A6920 Lyme disease, unspecified: Secondary | ICD-10-CM | POA: Insufficient documentation

## 2012-12-06 HISTORY — DX: Lyme disease, unspecified: A69.20

## 2012-12-06 NOTE — Assessment & Plan Note (Signed)
She reports having seen a red ring around the bite and is requesting testing for Lyme Disease

## 2012-12-08 ENCOUNTER — Ambulatory Visit: Payer: Self-pay | Admitting: Pain Medicine

## 2012-12-08 DIAGNOSIS — M5137 Other intervertebral disc degeneration, lumbosacral region: Secondary | ICD-10-CM | POA: Diagnosis not present

## 2012-12-08 DIAGNOSIS — M62838 Other muscle spasm: Secondary | ICD-10-CM | POA: Diagnosis not present

## 2012-12-08 DIAGNOSIS — M5030 Other cervical disc degeneration, unspecified cervical region: Secondary | ICD-10-CM | POA: Diagnosis not present

## 2012-12-08 DIAGNOSIS — Z79899 Other long term (current) drug therapy: Secondary | ICD-10-CM | POA: Diagnosis not present

## 2012-12-08 DIAGNOSIS — M461 Sacroiliitis, not elsewhere classified: Secondary | ICD-10-CM | POA: Diagnosis not present

## 2012-12-08 DIAGNOSIS — M47812 Spondylosis without myelopathy or radiculopathy, cervical region: Secondary | ICD-10-CM | POA: Diagnosis not present

## 2012-12-13 ENCOUNTER — Other Ambulatory Visit: Payer: Self-pay | Admitting: Internal Medicine

## 2012-12-27 DIAGNOSIS — Z872 Personal history of diseases of the skin and subcutaneous tissue: Secondary | ICD-10-CM | POA: Diagnosis not present

## 2012-12-27 DIAGNOSIS — L719 Rosacea, unspecified: Secondary | ICD-10-CM | POA: Diagnosis not present

## 2012-12-27 DIAGNOSIS — Z1283 Encounter for screening for malignant neoplasm of skin: Secondary | ICD-10-CM | POA: Diagnosis not present

## 2012-12-27 DIAGNOSIS — D485 Neoplasm of uncertain behavior of skin: Secondary | ICD-10-CM | POA: Diagnosis not present

## 2012-12-27 DIAGNOSIS — L821 Other seborrheic keratosis: Secondary | ICD-10-CM | POA: Diagnosis not present

## 2013-01-04 ENCOUNTER — Ambulatory Visit: Payer: Self-pay | Admitting: Pain Medicine

## 2013-01-04 DIAGNOSIS — M47812 Spondylosis without myelopathy or radiculopathy, cervical region: Secondary | ICD-10-CM | POA: Diagnosis not present

## 2013-01-04 DIAGNOSIS — Z981 Arthrodesis status: Secondary | ICD-10-CM | POA: Diagnosis not present

## 2013-01-04 DIAGNOSIS — Z79899 Other long term (current) drug therapy: Secondary | ICD-10-CM | POA: Diagnosis not present

## 2013-01-04 DIAGNOSIS — M5137 Other intervertebral disc degeneration, lumbosacral region: Secondary | ICD-10-CM | POA: Diagnosis not present

## 2013-01-04 DIAGNOSIS — M5030 Other cervical disc degeneration, unspecified cervical region: Secondary | ICD-10-CM | POA: Diagnosis not present

## 2013-01-28 ENCOUNTER — Ambulatory Visit: Payer: BC Managed Care – PPO | Admitting: Internal Medicine

## 2013-02-02 ENCOUNTER — Ambulatory Visit: Payer: Self-pay | Admitting: Pain Medicine

## 2013-02-02 DIAGNOSIS — R51 Headache: Secondary | ICD-10-CM | POA: Diagnosis not present

## 2013-02-02 DIAGNOSIS — M5030 Other cervical disc degeneration, unspecified cervical region: Secondary | ICD-10-CM | POA: Diagnosis not present

## 2013-02-02 DIAGNOSIS — M47812 Spondylosis without myelopathy or radiculopathy, cervical region: Secondary | ICD-10-CM | POA: Diagnosis not present

## 2013-02-02 DIAGNOSIS — M62838 Other muscle spasm: Secondary | ICD-10-CM | POA: Diagnosis not present

## 2013-02-02 DIAGNOSIS — Z79899 Other long term (current) drug therapy: Secondary | ICD-10-CM | POA: Diagnosis not present

## 2013-02-02 DIAGNOSIS — M5137 Other intervertebral disc degeneration, lumbosacral region: Secondary | ICD-10-CM | POA: Diagnosis not present

## 2013-02-03 ENCOUNTER — Telehealth: Payer: Self-pay | Admitting: *Deleted

## 2013-02-03 DIAGNOSIS — E119 Type 2 diabetes mellitus without complications: Secondary | ICD-10-CM

## 2013-02-03 NOTE — Telephone Encounter (Signed)
Pt is coming in for labs tomorrow what labs and dx?  

## 2013-02-04 ENCOUNTER — Other Ambulatory Visit (INDEPENDENT_AMBULATORY_CARE_PROVIDER_SITE_OTHER): Payer: BC Managed Care – PPO

## 2013-02-04 DIAGNOSIS — E119 Type 2 diabetes mellitus without complications: Secondary | ICD-10-CM | POA: Diagnosis not present

## 2013-02-04 LAB — COMPREHENSIVE METABOLIC PANEL
ALT: 23 U/L (ref 0–35)
AST: 28 U/L (ref 0–37)
Alkaline Phosphatase: 67 U/L (ref 39–117)
Creatinine, Ser: 0.7 mg/dL (ref 0.4–1.2)
Sodium: 141 mEq/L (ref 135–145)
Total Bilirubin: 0.5 mg/dL (ref 0.3–1.2)
Total Protein: 7.3 g/dL (ref 6.0–8.3)

## 2013-02-04 LAB — LDL CHOLESTEROL, DIRECT: Direct LDL: 152.9 mg/dL

## 2013-02-04 LAB — LIPID PANEL
Total CHOL/HDL Ratio: 6
Triglycerides: 287 mg/dL — ABNORMAL HIGH (ref 0.0–149.0)

## 2013-02-04 LAB — HEMOGLOBIN A1C: Hgb A1c MFr Bld: 6.4 % (ref 4.6–6.5)

## 2013-02-04 LAB — MICROALBUMIN / CREATININE URINE RATIO
Creatinine,U: 79 mg/dL
Microalb Creat Ratio: 0.1 mg/g (ref 0.0–30.0)
Microalb, Ur: 0.1 mg/dL (ref 0.0–1.9)

## 2013-02-07 ENCOUNTER — Encounter: Payer: Self-pay | Admitting: Internal Medicine

## 2013-02-07 ENCOUNTER — Ambulatory Visit (INDEPENDENT_AMBULATORY_CARE_PROVIDER_SITE_OTHER): Payer: BC Managed Care – PPO | Admitting: Internal Medicine

## 2013-02-07 VITALS — BP 130/80 | HR 73 | Temp 97.7°F | Resp 14 | Ht 62.5 in | Wt 174.2 lb

## 2013-02-07 DIAGNOSIS — I10 Essential (primary) hypertension: Secondary | ICD-10-CM

## 2013-02-07 DIAGNOSIS — I428 Other cardiomyopathies: Secondary | ICD-10-CM | POA: Diagnosis not present

## 2013-02-07 DIAGNOSIS — R9431 Abnormal electrocardiogram [ECG] [EKG]: Secondary | ICD-10-CM

## 2013-02-07 DIAGNOSIS — E669 Obesity, unspecified: Secondary | ICD-10-CM | POA: Diagnosis not present

## 2013-02-07 DIAGNOSIS — E119 Type 2 diabetes mellitus without complications: Secondary | ICD-10-CM

## 2013-02-07 DIAGNOSIS — E785 Hyperlipidemia, unspecified: Secondary | ICD-10-CM

## 2013-02-07 DIAGNOSIS — Z23 Encounter for immunization: Secondary | ICD-10-CM

## 2013-02-07 DIAGNOSIS — I4581 Long QT syndrome: Secondary | ICD-10-CM | POA: Diagnosis not present

## 2013-02-07 NOTE — Patient Instructions (Signed)
Your first goal is to get your weight to 165 lbs (BMI 29.7)  Your a1c is up to 6.4  Your HDL and  LDL has improved,  But your triglcyerides are up to 287!1  Tget back on the low carb diet  Go see the neurosurgeon to find out your options with your cervical issues

## 2013-02-07 NOTE — Progress Notes (Signed)
Patient ID: Julia Pierce, female   DOB: 10-04-1955, 57 y.o.   MRN: [id number]   Patient Active Problem List   Diagnosis Date Noted  . Erythema migrans (Lyme disease) 12/06/2012  . Tick bite of hip 09/13/2012  . Statin intolerance 03/03/2012  . Reflux esophagitis 12/31/2011  . Diabetes mellitus type 2, diet-controlled 09/27/2011  . Obesity (BMI 30-39.9) 05/12/2011  . Morton's neuroma   . Valvular cardiomyopathy 03/31/2011  . Hyperlipidemia LDL goal < 100 03/31/2011  . Long QT syndrome 02/05/2011  . Hypertension   . Depression   . Holter monitor, abnormal     Subjective:  CC:   Chief Complaint  Patient presents with  . Follow-up    3 month    HPI:   Julia Pierce a 57 y.o. female who presents Follow up on multipile chronic issues.   Her back pain has improved with swimming . She has developed bilateral numbess in hands whenever he flexes her neck or irons.    Wakes up with headache which resolves after 15 minutes. .She underwent an MRI of neck by Dr. Metta Clines which confirmed  C5/6 bulging disk and was referred for  neurosurgical consult .  She is reluctant to go since her experience with Sharolyn Douglas who did her  lumbar surgical fusion.  She is currenlty using heat and several therapeutic measures demonstrated by PT.   DM:  Checks blood sugars occasionally and her fastings have been < 120   Palpitations: she was referred to [facility] for Holter monitor dye to frequent palpitations.  She has noted that they are are less frequent since [facility] reduced her metoprolol.     Past Medical History  Diagnosis Date  . Arthritis   . Chicken pox   . Headache, frequent episodic tension-type   . Cardiac arrhythmia due to congenital heart disease   . History of high blood pressure     readings  . High cholesterol   . Migraines   . Hx of colonoscopy sept 2012    normal,  next due 2022, [name]  . Hypertension   . Depression   . Holter monitor, abnormal August 2012    done for long  QT.  some contractile asynchrony Gwen Pounds)  . Morton's neuroma     bilateral    Past Surgical History  Procedure Laterality Date  . Appendectomy  1973  . Tonsillectomy and adenoidectomy  1966  . Spinal fusion  03/2007    ruptured disc  L5  [name]  . Hysteroscopy  2005    heavy bleeding  . Abdominal hysterectomy  Dec 2013    [place]       The following portions of the patient's history were reviewed and updated as appropriate: Allergies, current medications, and problem list.    Review of Systems:   12 Pt  review of systems was negative except those addressed in the HPI,     History   Social History  . Marital Status: Married    Spouse Name: N/A    Number of Children: N/A  . Years of Education: N/A   Occupational History  . Not on file.   Social History Main Topics  . Smoking status: Never Smoker   . Smokeless tobacco: Never Used  . Alcohol Use: No  . Drug Use: No  . Sexual Activity: Not on file   Other Topics Concern  . Not on file   Social History Narrative  . No narrative on file    Objective:  Filed Vitals:   02/07/13 1444  BP: 130/80  Pulse: 73  Temp: 97.7 F (36.5 C)  Resp: 14     General appearance: alert, cooperative and appears stated age Ears: normal TM's and external ear canals both ears Throat: lips, mucosa, and tongue normal; teeth and gums normal Neck: no adenopathy, no carotid bruit, supple, symmetrical, trachea midline and thyroid not enlarged, symmetric, no tenderness/mass/nodules Back: symmetric, no curvature. ROM normal. No CVA tenderness. Lungs: clear to auscultation bilaterally Heart: regular rate and rhythm, S1, S2 normal, no murmur, click, rub or gallop Abdomen: soft, non-tender; bowel sounds normal; no masses,  no organomegaly Pulses: 2+ and symmetric Skin: Skin color, texture, turgor normal. No rashes or lesions Lymph nodes: Cervical, supraclavicular, and axillary nodes normal. Foot exam:  Nails are well trimmed,   No callouses,  Sensation intact to microfilament  Assessment and Plan:  Valvular cardiomyopathy Managed medically. No edema on exam  weight is stable. Currently asymptomatic   Continue current medications.  avoid salt.  Cotnineu regular exercise   Obesity (BMI 30-39.9) I have addressed  BMI and recommended wt loss of 10% of body weigh over the next 6 months using a low glycemic index diet and regular exercise a minimum of 5 days per week.    Hyperlipidemia LDL goal < 100 untreated with medication  secondary to statin intolerance. Trial of welchol recommended.  Lab Results  Component Value Date   CHOL 250* 02/04/2013   HDL 42.40 02/04/2013   LDLDIRECT 152.9 02/04/2013   TRIG 287.0* 02/04/2013   CHOLHDL 6 02/04/2013      Hypertension Well controlled on current regimen. Renal function stable, no changes today.  Lab Results  Component Value Date   CREATININE 0.7 02/04/2013    Holter monitor, abnormal Holter monitor showed sinus bradycardia with bundle branch block  Diabetes mellitus type 2, diet-controlled Well-controlled on diet alone.  hemoglobin A1c has been consistently less than 7.0 . She is up-to-date on eye exams and her foot exam is normal. l we'll repeat his urine microalbumin to creatinine ratio at next visit. Sheis on the appropriate medications.  Lab Results  Component Value Date   HGBA1C 6.4 02/04/2013    Updated Medication List Outpatient Encounter Prescriptions as of 02/07/2013  Medication Sig Dispense Refill  . Azelaic Acid (FINACEA EX) Apply 1 application topically 2 (two) times daily.      . B Complex Vitamins (B COMPLEX PO) Take by mouth.      . calcium carbonate (OS-CAL) 600 MG TABS Take 600 mg by mouth daily.        . Cholecalciferol (VITAMIN D3) 2000 UNITS TABS Take 1 tablet by mouth daily.      . diphenhydrAMINE (BENADRYL) 25 MG tablet Take 25 mg by mouth at bedtime as needed.        Marland Kitchen esomeprazole (NEXIUM) 40 MG capsule Take 1 capsule (40 mg  total) by mouth daily.  90 capsule  3  . fish oil-omega-3 fatty acids 1000 MG capsule Take 2 g by mouth daily.        . hydrochlorothiazide (HYDRODIURIL) 25 MG tablet Take 1 tablet (25 mg total) by mouth daily.  90 tablet  3  . HYDROcodone-acetaminophen (NORCO) 5-325 MG per tablet Take 1 tablet by mouth every 6 (six) hours as needed.        Marland Kitchen losartan (COZAAR) 50 MG tablet Take 1 tablet (50 mg total) by mouth daily.  90 tablet  3  . methocarbamol (ROBAXIN) 500 MG  tablet TAKE 1 TABLET TWICE A DAY AS NEEDED SPASMS  180 tablet  0  . metoprolol tartrate (LOPRESSOR) 25 MG tablet Take 1 tablet (25 mg total) by mouth 2 (two) times daily.  180 tablet  3  . Multiple Vitamin (MULTIVITAMIN) tablet Take 1 tablet by mouth daily.        . sertraline (ZOLOFT) 100 MG tablet TAKE 1 TABLET BY MOUTH DAILY  90 tablet  1  . spironolactone (ALDACTONE) 25 MG tablet Take 25 mg by mouth daily.      Marland Kitchen tolterodine (DETROL) 2 MG tablet Take 2 mg by mouth 2 (two) times daily.      Marland Kitchen dexlansoprazole (DEXILANT) 60 MG capsule Take 1 capsule (60 mg total) by mouth daily.  21 capsule  0  . fluticasone (FLONASE) 50 MCG/ACT nasal spray Place 2 sprays into the nose daily.        . [DISCONTINUED] nitrofurantoin (MACRODANTIN) 100 MG capsule Take 100 mg by mouth 4 (four) times daily.       No facility-administered encounter medications on file as of 02/07/2013.     Orders Placed This Encounter  Procedures  . Flu Vaccine QUAD 36+ mos PF IM (Fluarix)  . HM DIABETES FOOT EXAM    Return in about 3 months (around 05/10/2013).

## 2013-02-12 NOTE — Assessment & Plan Note (Signed)
Holter monitor showed sinus bradycardia with bundle branch block

## 2013-02-12 NOTE — Assessment & Plan Note (Signed)
Well controlled on current regimen. Renal function stable, no changes today.  Lab Results  Component Value Date   CREATININE 0.7 02/04/2013    

## 2013-02-12 NOTE — Assessment & Plan Note (Signed)
I have addressed  BMI and recommended wt loss of 10% of body weigh over the next 6 months using a low glycemic index diet and regular exercise a minimum of 5 days per week.   

## 2013-02-12 NOTE — Assessment & Plan Note (Signed)
untreated with medication  secondary to statin intolerance. Trial of welchol recommended.  Lab Results  Component Value Date   CHOL 250* 02/04/2013   HDL 42.40 02/04/2013   LDLDIRECT 152.9 02/04/2013   TRIG 287.0* 02/04/2013   CHOLHDL 6 02/04/2013

## 2013-02-12 NOTE — Assessment & Plan Note (Signed)
Well-controlled on diet alone.  hemoglobin A1c has been consistently less than 7.0 . She is up-to-date on eye exams and her foot exam is normal. l we'll repeat his urine microalbumin to creatinine ratio at next visit. Sheis on the appropriate medications.  Lab Results  Component Value Date   HGBA1C 6.4 02/04/2013

## 2013-02-12 NOTE — Assessment & Plan Note (Signed)
Managed medically. No edema on exam  weight is stable. Currently asymptomatic   Continue current medications.  avoid salt.  Cotnineu regular exercise 

## 2013-02-12 NOTE — Assessment & Plan Note (Deleted)
Managed medically. No edema on exam  weight is stable. Currently asymptomatic   Continue current medications.  avoid salt.  Cotnineu regular exercise

## 2013-03-01 ENCOUNTER — Encounter: Payer: Self-pay | Admitting: Internal Medicine

## 2013-03-07 ENCOUNTER — Ambulatory Visit: Payer: Self-pay | Admitting: Pain Medicine

## 2013-03-07 DIAGNOSIS — Z981 Arthrodesis status: Secondary | ICD-10-CM | POA: Diagnosis not present

## 2013-03-07 DIAGNOSIS — Z79899 Other long term (current) drug therapy: Secondary | ICD-10-CM | POA: Diagnosis not present

## 2013-03-07 DIAGNOSIS — M5137 Other intervertebral disc degeneration, lumbosacral region: Secondary | ICD-10-CM | POA: Diagnosis not present

## 2013-03-07 DIAGNOSIS — G894 Chronic pain syndrome: Secondary | ICD-10-CM | POA: Diagnosis not present

## 2013-03-07 DIAGNOSIS — F449 Dissociative and conversion disorder, unspecified: Secondary | ICD-10-CM | POA: Diagnosis not present

## 2013-03-07 DIAGNOSIS — M47812 Spondylosis without myelopathy or radiculopathy, cervical region: Secondary | ICD-10-CM | POA: Diagnosis not present

## 2013-03-07 DIAGNOSIS — M5030 Other cervical disc degeneration, unspecified cervical region: Secondary | ICD-10-CM | POA: Diagnosis not present

## 2013-04-06 ENCOUNTER — Ambulatory Visit: Payer: Self-pay | Admitting: Pain Medicine

## 2013-04-06 DIAGNOSIS — M5137 Other intervertebral disc degeneration, lumbosacral region: Secondary | ICD-10-CM | POA: Diagnosis not present

## 2013-04-06 DIAGNOSIS — M5030 Other cervical disc degeneration, unspecified cervical region: Secondary | ICD-10-CM | POA: Diagnosis not present

## 2013-04-06 DIAGNOSIS — Z79899 Other long term (current) drug therapy: Secondary | ICD-10-CM | POA: Diagnosis not present

## 2013-04-06 DIAGNOSIS — M47812 Spondylosis without myelopathy or radiculopathy, cervical region: Secondary | ICD-10-CM | POA: Diagnosis not present

## 2013-04-11 DIAGNOSIS — I509 Heart failure, unspecified: Secondary | ICD-10-CM | POA: Diagnosis not present

## 2013-04-11 DIAGNOSIS — I110 Hypertensive heart disease with heart failure: Secondary | ICD-10-CM | POA: Diagnosis not present

## 2013-04-11 DIAGNOSIS — I5022 Chronic systolic (congestive) heart failure: Secondary | ICD-10-CM | POA: Diagnosis not present

## 2013-04-12 ENCOUNTER — Other Ambulatory Visit: Payer: Self-pay | Admitting: Internal Medicine

## 2013-04-19 ENCOUNTER — Other Ambulatory Visit: Payer: Self-pay | Admitting: Internal Medicine

## 2013-04-19 MED ORDER — METHOCARBAMOL 500 MG PO TABS: 500.0000 mg | ORAL_TABLET | Freq: Three times a day (TID) | ORAL | Status: DC | PRN

## 2013-04-22 NOTE — Telephone Encounter (Signed)
Nexium prescription needed.  States pharmacy told her they have not received it.  Please contact pt when we resend rx so she can go to pharmacy

## 2013-04-25 DIAGNOSIS — I5022 Chronic systolic (congestive) heart failure: Secondary | ICD-10-CM | POA: Diagnosis not present

## 2013-04-25 DIAGNOSIS — R609 Edema, unspecified: Secondary | ICD-10-CM | POA: Diagnosis not present

## 2013-04-25 DIAGNOSIS — E782 Mixed hyperlipidemia: Secondary | ICD-10-CM | POA: Diagnosis not present

## 2013-04-25 DIAGNOSIS — I519 Heart disease, unspecified: Secondary | ICD-10-CM | POA: Diagnosis not present

## 2013-04-26 NOTE — Telephone Encounter (Signed)
Pt called today stated that [facility] told her that her nexium needs prior autho Pt is completely out of her meds

## 2013-05-03 ENCOUNTER — Telehealth: Payer: Self-pay | Admitting: *Deleted

## 2013-05-03 ENCOUNTER — Telehealth: Payer: Self-pay | Admitting: Internal Medicine

## 2013-05-03 NOTE — Telephone Encounter (Signed)
Pt states she has been waiting since 12/23 for her Nexium.  States her pharmacy told her we have to call insurance for preauthorization.

## 2013-05-03 NOTE — Telephone Encounter (Signed)
Called [phone] for prior authorization on the Nexium 40 mg, form is being faxed over now

## 2013-05-03 NOTE — Telephone Encounter (Signed)
Received PA Request form placed in Dr.Tullo box

## 2013-05-04 ENCOUNTER — Ambulatory Visit: Payer: Self-pay | Admitting: Pain Medicine

## 2013-05-04 NOTE — Telephone Encounter (Signed)
Left message for patient to call office so I could advise patient that prior authorization received on 05/03/13 from pharmacy and have faxed to patient insurance awaiting approval.

## 2013-05-05 ENCOUNTER — Telehealth: Payer: Self-pay | Admitting: *Deleted

## 2013-05-05 DIAGNOSIS — E119 Type 2 diabetes mellitus without complications: Secondary | ICD-10-CM

## 2013-05-05 DIAGNOSIS — E785 Hyperlipidemia, unspecified: Secondary | ICD-10-CM

## 2013-05-05 NOTE — Telephone Encounter (Signed)
Pt is coming in for labs tomorrow 0106.2015 what labs and dx? 

## 2013-05-06 ENCOUNTER — Other Ambulatory Visit: Payer: BC Managed Care – PPO

## 2013-05-06 MED ORDER — PANTOPRAZOLE SODIUM 40 MG PO TBEC: 40.0000 mg | DELAYED_RELEASE_TABLET | Freq: Every day | ORAL | Status: DC

## 2013-05-06 NOTE — Telephone Encounter (Signed)
Her insurance has denied coverage for nexium because  she does not meet criteria with a condition that necessitates use of nexium over the other PPIs that are generic, so I am sending an alternative to her pharmacy to try for 30 days,

## 2013-05-06 NOTE — Telephone Encounter (Signed)
She is coming a day too early for her a1c,  Insurance may not pay for it.

## 2013-05-06 NOTE — Telephone Encounter (Signed)
Patient notified of change and denial.

## 2013-05-09 ENCOUNTER — Other Ambulatory Visit (INDEPENDENT_AMBULATORY_CARE_PROVIDER_SITE_OTHER): Payer: BC Managed Care – PPO

## 2013-05-09 DIAGNOSIS — E785 Hyperlipidemia, unspecified: Secondary | ICD-10-CM | POA: Diagnosis not present

## 2013-05-09 DIAGNOSIS — E119 Type 2 diabetes mellitus without complications: Secondary | ICD-10-CM

## 2013-05-09 LAB — COMPREHENSIVE METABOLIC PANEL
ALT: 23 U/L (ref 0–35)
AST: 22 U/L (ref 0–37)
Albumin: 4.2 g/dL (ref 3.5–5.2)
Alkaline Phosphatase: 68 U/L (ref 39–117)
BILIRUBIN TOTAL: 0.5 mg/dL (ref 0.3–1.2)
BUN: 15 mg/dL (ref 6–23)
CO2: 27 mEq/L (ref 19–32)
Calcium: 9.4 mg/dL (ref 8.4–10.5)
Chloride: 102 mEq/L (ref 96–112)
Creatinine, Ser: 0.8 mg/dL (ref 0.4–1.2)
GFR: 82.85 mL/min (ref >60.00)
GLUCOSE: 113 mg/dL — AB (ref 70–99)
Potassium: 4.3 mEq/L (ref 3.5–5.1)
Sodium: 139 mEq/L (ref 135–145)
Total Protein: 7.5 g/dL (ref 6.0–8.3)

## 2013-05-09 LAB — LIPID PANEL
Cholesterol: 237 mg/dL — ABNORMAL HIGH (ref 0–200)
HDL: 42.9 mg/dL (ref >39.00)
TRIGLYCERIDES: 371 mg/dL — AB (ref 0.0–149.0)
Total CHOL/HDL Ratio: 6
VLDL: 74.2 mg/dL — ABNORMAL HIGH (ref 0.0–40.0)

## 2013-05-09 LAB — TSH: TSH: 3.11 u[IU]/mL (ref 0.35–5.50)

## 2013-05-09 LAB — HEMOGLOBIN A1C: Hgb A1c MFr Bld: 6.3 % (ref 4.6–6.5)

## 2013-05-09 LAB — LDL CHOLESTEROL, DIRECT: Direct LDL: 150.2 mg/dL

## 2013-05-10 ENCOUNTER — Encounter: Payer: Self-pay | Admitting: *Deleted

## 2013-05-11 ENCOUNTER — Encounter: Payer: Self-pay | Admitting: Internal Medicine

## 2013-05-11 ENCOUNTER — Ambulatory Visit (INDEPENDENT_AMBULATORY_CARE_PROVIDER_SITE_OTHER): Payer: BC Managed Care – PPO | Admitting: Internal Medicine

## 2013-05-11 ENCOUNTER — Telehealth: Payer: Self-pay | Admitting: Internal Medicine

## 2013-05-11 VITALS — BP 138/78 | HR 81 | Temp 97.9°F | Resp 16 | Wt 175.0 lb

## 2013-05-11 DIAGNOSIS — K2100 Gastro-esophageal reflux disease with esophagitis, without bleeding: Secondary | ICD-10-CM | POA: Diagnosis not present

## 2013-05-11 DIAGNOSIS — Z23 Encounter for immunization: Secondary | ICD-10-CM | POA: Diagnosis not present

## 2013-05-11 DIAGNOSIS — I10 Essential (primary) hypertension: Secondary | ICD-10-CM

## 2013-05-11 DIAGNOSIS — M4802 Spinal stenosis, cervical region: Secondary | ICD-10-CM | POA: Diagnosis not present

## 2013-05-11 DIAGNOSIS — E119 Type 2 diabetes mellitus without complications: Secondary | ICD-10-CM | POA: Diagnosis not present

## 2013-05-11 DIAGNOSIS — E785 Hyperlipidemia, unspecified: Secondary | ICD-10-CM | POA: Diagnosis not present

## 2013-05-11 DIAGNOSIS — E669 Obesity, unspecified: Secondary | ICD-10-CM | POA: Diagnosis not present

## 2013-05-11 LAB — HM DIABETES FOOT EXAM: HM Diabetic Foot Exam: NORMAL

## 2013-05-11 NOTE — Assessment & Plan Note (Signed)
Responds only to nexium . Pharmacy is forcing a change to pantoprazole, which has caused diarrhea in the past .  Without daily nexium her esophagitis was so severe she could not sleep at night.

## 2013-05-11 NOTE — Patient Instructions (Signed)
Your diabetes is well-controlled on diet alone .  hemoglobin A1c has been consistently less than 7.0 .   Your triglycerides are up to 370.  Please follow a low glycemic index diet and return for repeat fasting lipids in 3 months  If they are still > 250,  We should try Lopid (gemfibrozil) to lower them  Please get your eyes checked ASP

## 2013-05-11 NOTE — Progress Notes (Signed)
Pre-visit discussion using our clinic review tool. No additional management support is needed unless otherwise documented below in the visit note.  

## 2013-05-11 NOTE — Progress Notes (Signed)
Patient ID: Julia Pierce, female   DOB: Aug 07, 1955, 58 y.o.   MRN: [id number]  Patient Active Problem List   Diagnosis Date Noted  . Cervical stenosis of spine 05/11/2013  . Erythema migrans (Lyme disease) 12/06/2012  . Tick bite of hip 09/13/2012  . Statin intolerance 03/03/2012  . Reflux esophagitis 12/31/2011  . Diabetes mellitus type 2, diet-controlled 09/27/2011  . Obesity (BMI 30-39.9) 05/12/2011  . Morton's neuroma   . Valvular cardiomyopathy 03/31/2011  . Hyperlipidemia LDL goal < 100 03/31/2011  . Long QT syndrome 02/05/2011  . Hypertension   . Depression   . Holter monitor, abnormal     Subjective:  CC:   Chief Complaint  Patient presents with  . Follow-up    3 month    HPI:   Julia Pierce a 58 y.o. female who presents for three month follow up on DM and hyperlipidemia.  Recent labs were done and her triglycerides were elevated to  370.  She reports significant  overindulgence over the holidays.  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    Blood sugars have been checked occasionally,  fastings < 140 and post prandials < 150   Past Medical History  Diagnosis Date  . Arthritis   . Chicken pox   . Headache, frequent episodic tension-type   . Cardiac arrhythmia due to congenital heart disease   . History of high blood pressure     readings  . High cholesterol   . Migraines   . Hx of colonoscopy sept 2012    normal,  next due 2022, [name]  . Hypertension   . Depression   . Holter monitor, abnormal August 2012    done for long QT.  some contractile asynchrony [name])  . Morton's neuroma     bilateral    Past Surgical History  Procedure Laterality Date  . Appendectomy  1973  . Tonsillectomy and adenoidectomy  1966  . Spinal fusion  03/2007    ruptured disc  L5  [name]  . Hysteroscopy  2005    heavy bleeding  . Abdominal hysterectomy   Dec 2013    [place]       The following portions of the patient's history were reviewed and updated as appropriate: Allergies, current medications, and problem list.    Review of Systems:   12 Pt  review of systems was negative except those addressed in the HPI,     History   Social History  . Marital Status: Married    Spouse Name: N/A    Number of Children: N/A  . Years of Education: N/A   Occupational History  . Not on file.   Social History Main Topics  . Smoking status: Never Smoker   . Smokeless tobacco: Never Used  . Alcohol Use: No  . Drug Use: No  . Sexual Activity: Not on file   Other Topics Concern  . Not on file   Social History Narrative  . No narrative on file    Objective:  Filed Vitals:   05/11/13 0942  BP: 138/78  Pulse: 81  Temp: 97.9 F (36.6 C)  Resp: 16     General appearance: alert, cooperative and appears stated age Ears: normal TM's and external ear canals both ears Throat: lips, mucosa, and tongue normal; teeth and gums normal Neck: no adenopathy, no carotid bruit,  supple, symmetrical, trachea midline and thyroid not enlarged, symmetric, no tenderness/mass/nodules Back: symmetric, no curvature. ROM normal. No CVA tenderness. Lungs: clear to auscultation bilaterally Heart: regular rate and rhythm, S1, S2 normal, no murmur, click, rub or gallop Abdomen: soft, non-tender; bowel sounds normal; no masses,  no organomegaly Pulses: 2+ and symmetric Skin: Skin color, texture, turgor normal. No rashes or lesions Lymph nodes: Cervical, supraclavicular, and axillary nodes normal.  Assessment and Plan:  Reflux esophagitis Responds only to nexium . Pharmacy is forcing a change to pantoprazole, which has caused diarrhea in the past .  Without daily nexium her esophagitis was so severe she could not sleep at night.   Diabetes mellitus type 2, diet-controlled Well-controlled on current medications.  hemoglobin A1c has been consistently  less than 7.0 . She is overdue for an eye exam ; her foot exam is normal. l we'll repeat his urine microalbumin to creatinine ratio at next visit. She  is on the appropriate medications.  Lab Results  Component Value Date   HGBA1C 6.3 05/09/2013   Lab Results  Component Value Date   MICROALBUR 0.1 02/04/2013     Hyperlipidemia LDL goal < 100 Elevated triglcyerides discussed.  Weill repeat in 3 months after low Gi diet adhered to and start gemfibrozil   Hypertension Well controlled on current regimen. Renal function stable, no changes today.  Obesity (BMI 30-39.9) I have addressed  BMI and recommended a low glycemic index diet utilizing smaller more frequent meals to increase metabolism.  I have also recommended that patient start exercising with a goal of 30 minutes of aerobic exercise a minimum of 5 days per week.    Updated Medication List Outpatient Encounter Prescriptions as of 05/11/2013  Medication Sig  . Azelaic Acid (FINACEA EX) Apply 1 application topically 2 (two) times daily.  . B Complex Vitamins (B COMPLEX PO) Take by mouth.  . calcium carbonate (OS-CAL) 600 MG TABS Take 600 mg by mouth daily.    . Cholecalciferol (VITAMIN D3) 2000 UNITS TABS Take 1 tablet by mouth daily.  . diphenhydrAMINE (BENADRYL) 25 MG tablet Take 25 mg by mouth at bedtime as needed.    . fish oil-omega-3 fatty acids 1000 MG capsule Take 2 g by mouth daily.    . hydrochlorothiazide (HYDRODIURIL) 25 MG tablet Take 1 tablet (25 mg total) by mouth daily.  Marland Kitchen HYDROcodone-acetaminophen (NORCO) 5-325 MG per tablet Take 1 tablet by mouth every 6 (six) hours as needed.    Marland Kitchen losartan (COZAAR) 50 MG tablet Take 1 tablet (50 mg total) by mouth daily.  . methocarbamol (ROBAXIN) 500 MG tablet Take 1 tablet (500 mg total) by mouth every 8 (eight) hours as needed for muscle spasms.  . metoprolol tartrate (LOPRESSOR) 25 MG tablet Take 1 tablet (25 mg total) by mouth 2 (two) times daily.  . Multiple Vitamin  (MULTIVITAMIN) tablet Take 1 tablet by mouth daily.    . pantoprazole (PROTONIX) 40 MG tablet Take 1 tablet (40 mg total) by mouth daily.  . sertraline (ZOLOFT) 100 MG tablet TAKE 1 TABLET BY MOUTH DAILY  . spironolactone (ALDACTONE) 25 MG tablet Take 25 mg by mouth daily.  Marland Kitchen tolterodine (DETROL) 2 MG tablet Take 2 mg by mouth 2 (two) times daily.  . [DISCONTINUED] dexlansoprazole (DEXILANT) 60 MG capsule Take 1 capsule (60 mg total) by mouth daily.  . [DISCONTINUED] fluticasone (FLONASE) 50 MCG/ACT nasal spray Place 2 sprays into the nose daily.

## 2013-05-12 ENCOUNTER — Encounter: Payer: Self-pay | Admitting: Internal Medicine

## 2013-05-12 ENCOUNTER — Telehealth: Payer: Self-pay | Admitting: Internal Medicine

## 2013-05-12 NOTE — Assessment & Plan Note (Signed)
Elevated triglcyerides discussed.  Weill repeat in 3 months after low Gi diet adhered to and start gemfibrozil

## 2013-05-12 NOTE — Assessment & Plan Note (Addendum)
Well-controlled on current medications.  hemoglobin A1c has been consistently less than 7.0 . She is overdue for an eye exam ; her foot exam is normal. l we'll repeat his urine microalbumin to creatinine ratio at next visit. She  is on the appropriate medications.  Lab Results  Component Value Date   HGBA1C 6.3 05/09/2013   Lab Results  Component Value Date   MICROALBUR 0.1 02/04/2013

## 2013-05-12 NOTE — Telephone Encounter (Signed)
error 

## 2013-05-12 NOTE — Assessment & Plan Note (Signed)
I have addressed  BMI and recommended a low glycemic index diet utilizing smaller more frequent meals to increase metabolism.  I have also recommended that patient start exercising with a goal of 30 minutes of aerobic exercise a minimum of 5 days per week.  

## 2013-05-12 NOTE — Assessment & Plan Note (Signed)
Well controlled on current regimen. Renal function stable, no changes today. 

## 2013-05-16 ENCOUNTER — Telehealth: Payer: Self-pay | Admitting: Internal Medicine

## 2013-05-22 HISTORY — PX: BI-VENTRICULAR PACEMAKER INSERTION (CRT-P): SHX5750

## 2013-05-31 ENCOUNTER — Emergency Department (HOSPITAL_COMMUNITY): Payer: BC Managed Care – PPO

## 2013-05-31 ENCOUNTER — Telehealth: Payer: Self-pay | Admitting: Internal Medicine

## 2013-05-31 ENCOUNTER — Encounter (HOSPITAL_COMMUNITY): Payer: Self-pay | Admitting: Emergency Medicine

## 2013-05-31 ENCOUNTER — Inpatient Hospital Stay (HOSPITAL_COMMUNITY)
Admission: EM | Admit: 2013-05-31 | Discharge: 2013-06-03 | DRG: 244 | Disposition: A | Discharge status: Home / Self Care | Payer: BC Managed Care – PPO | Attending: Internal Medicine | Admitting: Internal Medicine

## 2013-05-31 DIAGNOSIS — Z950 Presence of cardiac pacemaker: Secondary | ICD-10-CM | POA: Diagnosis not present

## 2013-05-31 DIAGNOSIS — M4120 Other idiopathic scoliosis, site unspecified: Secondary | ICD-10-CM | POA: Diagnosis not present

## 2013-05-31 DIAGNOSIS — I519 Heart disease, unspecified: Secondary | ICD-10-CM | POA: Diagnosis not present

## 2013-05-31 DIAGNOSIS — I428 Other cardiomyopathies: Secondary | ICD-10-CM | POA: Diagnosis not present

## 2013-05-31 DIAGNOSIS — R0989 Other specified symptoms and signs involving the circulatory and respiratory systems: Secondary | ICD-10-CM | POA: Diagnosis not present

## 2013-05-31 DIAGNOSIS — I447 Left bundle-branch block, unspecified: Secondary | ICD-10-CM | POA: Diagnosis not present

## 2013-05-31 DIAGNOSIS — F3289 Other specified depressive episodes: Secondary | ICD-10-CM | POA: Diagnosis present

## 2013-05-31 DIAGNOSIS — I4581 Long QT syndrome: Secondary | ICD-10-CM | POA: Diagnosis present

## 2013-05-31 DIAGNOSIS — I509 Heart failure, unspecified: Secondary | ICD-10-CM | POA: Diagnosis present

## 2013-05-31 DIAGNOSIS — F329 Major depressive disorder, single episode, unspecified: Secondary | ICD-10-CM | POA: Diagnosis present

## 2013-05-31 DIAGNOSIS — R0609 Other forms of dyspnea: Secondary | ICD-10-CM | POA: Diagnosis not present

## 2013-05-31 DIAGNOSIS — Z981 Arthrodesis status: Secondary | ICD-10-CM | POA: Diagnosis not present

## 2013-05-31 DIAGNOSIS — R55 Syncope and collapse: Secondary | ICD-10-CM

## 2013-05-31 DIAGNOSIS — I10 Essential (primary) hypertension: Secondary | ICD-10-CM

## 2013-05-31 DIAGNOSIS — M949 Disorder of cartilage, unspecified: Secondary | ICD-10-CM

## 2013-05-31 DIAGNOSIS — S298XXA Other specified injuries of thorax, initial encounter: Secondary | ICD-10-CM | POA: Diagnosis not present

## 2013-05-31 DIAGNOSIS — E785 Hyperlipidemia, unspecified: Secondary | ICD-10-CM | POA: Diagnosis present

## 2013-05-31 DIAGNOSIS — M899 Disorder of bone, unspecified: Secondary | ICD-10-CM | POA: Diagnosis present

## 2013-05-31 HISTORY — DX: Syncope and collapse: R55

## 2013-05-31 LAB — CBC
HEMATOCRIT: 38.3 % (ref 36.0–46.0)
Hemoglobin: 13.3 g/dL (ref 12.0–15.0)
MCH: 28.9 pg (ref 26.0–34.0)
MCHC: 34.7 g/dL (ref 30.0–36.0)
MCV: 83.1 fL (ref 78.0–100.0)
PLATELETS: 201 10*3/uL (ref 150–400)
RBC: 4.61 MIL/uL (ref 3.87–5.11)
RDW: 13.7 % (ref 11.5–15.5)
WBC: 7.8 10*3/uL (ref 4.0–10.5)

## 2013-05-31 LAB — BASIC METABOLIC PANEL
BUN: 16 mg/dL (ref 6–23)
CHLORIDE: 101 meq/L (ref 96–112)
CO2: 24 meq/L (ref 19–32)
Calcium: 9.7 mg/dL (ref 8.4–10.5)
Creatinine, Ser: 0.65 mg/dL (ref 0.50–1.10)
GFR calc non Af Amer: >90 mL/min (ref >90)
Glucose, Bld: 118 mg/dL — ABNORMAL HIGH (ref 70–99)
Potassium: 4.3 mEq/L (ref 3.7–5.3)
Sodium: 140 mEq/L (ref 137–147)

## 2013-05-31 LAB — POCT I-STAT TROPONIN I: TROPONIN I, POC: 0 ng/mL (ref 0.00–0.08)

## 2013-05-31 MED ORDER — HYDROCODONE-ACETAMINOPHEN 5-325 MG PO TABS
1.0000 | ORAL_TABLET | Freq: Two times a day (BID) | ORAL | Status: DC | PRN
Start: 2013-05-31 — End: 2013-06-03
  Administered 2013-05-31: 1 via ORAL
  Filled 2013-05-31: qty 1

## 2013-05-31 MED ORDER — LOSARTAN POTASSIUM 50 MG PO TABS
50.0000 mg | ORAL_TABLET | Freq: Every day | ORAL | Status: DC
  Administered 2013-06-01 – 2013-06-03 (×3): 50 mg via ORAL
  Filled 2013-05-31 (×3): qty 1

## 2013-05-31 MED ORDER — CALCIUM CARBONATE 600 MG PO TABS: 600.0000 mg | ORAL_TABLET | Freq: Every day | ORAL | Status: DC

## 2013-05-31 MED ORDER — DIPHENHYDRAMINE HCL 25 MG PO TABS
25.0000 mg | ORAL_TABLET | Freq: Every evening | ORAL | Status: DC | PRN
  Filled 2013-05-31: qty 1

## 2013-05-31 MED ORDER — SPIRONOLACTONE 25 MG PO TABS
25.0000 mg | ORAL_TABLET | Freq: Every day | ORAL | Status: DC
  Administered 2013-06-01 – 2013-06-03 (×2): 25 mg via ORAL
  Filled 2013-05-31 (×3): qty 1

## 2013-05-31 MED ORDER — PANTOPRAZOLE SODIUM 40 MG PO TBEC
40.0000 mg | DELAYED_RELEASE_TABLET | Freq: Every day | ORAL | Status: DC
  Administered 2013-06-01 – 2013-06-03 (×3): 40 mg via ORAL
  Filled 2013-05-31 (×2): qty 1

## 2013-05-31 MED ORDER — ACETAMINOPHEN 650 MG RE SUPP: 650.0000 mg | Freq: Four times a day (QID) | RECTAL | Status: DC | PRN

## 2013-05-31 MED ORDER — HYDROCHLOROTHIAZIDE 25 MG PO TABS
12.5000 mg | ORAL_TABLET | Freq: Every day | ORAL | Status: DC
  Filled 2013-05-31: qty 0.5

## 2013-05-31 MED ORDER — ACETAMINOPHEN 325 MG PO TABS: 650.0000 mg | ORAL_TABLET | Freq: Four times a day (QID) | ORAL | Status: DC | PRN

## 2013-05-31 MED ORDER — SODIUM CHLORIDE 0.9 % IJ SOLN
3.0000 mL | Freq: Two times a day (BID) | INTRAMUSCULAR | Status: DC
  Administered 2013-05-31 – 2013-06-02 (×3): 3 mL via INTRAVENOUS

## 2013-05-31 MED ORDER — SERTRALINE HCL 100 MG PO TABS: 100.0000 mg | ORAL_TABLET | Freq: Every day | ORAL | Status: DC

## 2013-05-31 MED ORDER — ASPIRIN EC 81 MG PO TBEC
81.0000 mg | DELAYED_RELEASE_TABLET | Freq: Every day | ORAL | Status: DC
  Administered 2013-06-01 – 2013-06-03 (×2): 81 mg via ORAL
  Filled 2013-05-31 (×3): qty 1

## 2013-05-31 MED ORDER — CALCIUM CARBONATE 1250 (500 CA) MG PO TABS
1.0000 | ORAL_TABLET | Freq: Every day | ORAL | Status: DC
  Administered 2013-06-01 – 2013-06-03 (×2): 500 mg via ORAL
  Filled 2013-05-31 (×4): qty 1

## 2013-05-31 MED ORDER — METHOCARBAMOL 500 MG PO TABS
500.0000 mg | ORAL_TABLET | Freq: Every evening | ORAL | Status: DC | PRN
  Administered 2013-05-31: 500 mg via ORAL
  Filled 2013-05-31: qty 1

## 2013-05-31 MED ORDER — METOPROLOL TARTRATE 25 MG PO TABS
25.0000 mg | ORAL_TABLET | Freq: Two times a day (BID) | ORAL | Status: DC
  Administered 2013-05-31 – 2013-06-03 (×6): 25 mg via ORAL
  Filled 2013-05-31 (×7): qty 1

## 2013-05-31 MED ORDER — HEPARIN SODIUM (PORCINE) 5000 UNIT/ML IJ SOLN
5000.0000 [IU] | Freq: Three times a day (TID) | INTRAMUSCULAR | Status: DC
  Administered 2013-05-31 – 2013-06-02 (×4): 5000 [IU] via SUBCUTANEOUS
  Filled 2013-05-31 (×11): qty 1

## 2013-05-31 NOTE — ED Notes (Signed)
Report called to Amanda, RN.

## 2013-05-31 NOTE — Telephone Encounter (Signed)
Patient Information:  Caller Name: Sabine  Phone: [phone]  Patient: Julia Pierce  Gender: Female  DOB: [birth year]  Age: 58 Years  PCP: Deborra Medina (Adults only)  Office Follow Up:  Does the office need to follow up with this patient?: No  Instructions For The Office: N/A  RN Note:  Call transferred emergently to RN.  Patient fainted 1115 05/31/13.  States was getting ready "to leave the gym" when she became lightheaded and she fainted.  States she continues to feel weak, but she currently denies lightheadedness or dizziness.  States she had already swum, showered and dressed and was on the way to the parking lot.  Husband is on his way to pick her up from the gym.  Per fainting protocol, advised ED/[facility]; patient agrees.  Office staff notified.  krs/can  Symptoms  Reason For Call & Symptoms: fainted 1115 05/31/13  Reviewed Health History In EMR: Yes  Reviewed Medications In EMR: Yes  Reviewed Allergies In EMR: Yes  Reviewed Surgeries / Procedures: Yes  Date of Onset of Symptoms: 05/31/2013  Guideline(s) Used:  Fainting  Disposition Per Guideline:   Go to ED Now (or to Office with PCP Approval)  Reason For Disposition Reached:   Age > 50 years  Advice Given:  N/A  Patient Will Follow Care Advice:  YES

## 2013-05-31 NOTE — ED Provider Notes (Signed)
CSN: [id number]     Arrival date & time 05/31/13  1229 History   First MD Initiated Contact with Patient 05/31/13 1256     Chief Complaint  Patient presents with  . Loss of Consciousness     HPI  Patient presents after an episode of syncope. Patient notes that she was in her usual state of health prior to having the event. There is no program, patient awoke on the ground with whole-body shaking, mild brief heaviness in her chest, and dizziness. Prior to the event patient had completed a typical exercise session for her. She denies any medication changes, no change activity changes. Patient does have a history of QT prolongation. On my exam the patient has no ongoing complaints, including no headache, chest pain, lightheadedness, dyspnea.   Past Medical History  Diagnosis Date  . Arthritis   . Chicken pox   . Headache, frequent episodic tension-type   . Cardiac arrhythmia due to congenital heart disease   . History of high blood pressure     readings  . High cholesterol   . Migraines   . Hx of colonoscopy sept 2012    normal,  next due 2022, [name]  . Hypertension   . Depression   . Holter monitor, abnormal August 2012    done for long QT.  some contractile asynchrony [name])  . Morton's neuroma     bilateral   Past Surgical History  Procedure Laterality Date  . Appendectomy  1973  . Tonsillectomy and adenoidectomy  1966  . Spinal fusion  03/2007    ruptured disc  L5  [name]  . Hysteroscopy  2005    heavy bleeding  . Abdominal hysterectomy  Dec 2013    [facility]   Family History  Problem Relation Age of Onset  . Arthritis Mother   . Hyperlipidemia Mother   . Arthritis Father   . Cancer Father     prostate cancer   . Hyperlipidemia Father   . Cancer Other     ovarian,uterus  . Heart disease Other   . Stroke Other   . Learning disabilities Other   . Cirrhosis Mother   . Coronary artery disease Paternal Grandfather 59  . Pulmonary embolism Father 63    History  Substance Use Topics  . Smoking status: Never Smoker   . Smokeless tobacco: Never Used  . Alcohol Use: No   OB History   Grav Para Term Preterm Abortions TAB SAB Ect Mult Living                 Review of Systems  Constitutional:       Per HPI, otherwise negative  HENT:       Per HPI, otherwise negative  Respiratory:       Per HPI, otherwise negative  Cardiovascular:       Per HPI, otherwise negative  Gastrointestinal: Negative for vomiting.  Endocrine:       Negative aside from HPI  Genitourinary:       Neg aside from HPI   Musculoskeletal:       Per HPI, otherwise negative  Skin: Negative.   Neurological: Positive for syncope.      Allergies  Cephalexin; Atorvastatin; Penicillins; and Latex  Home Medications   Current Outpatient Rx  Name  Route  Sig  Dispense  Refill  . aspirin EC 81 MG tablet   Oral   Take 81 mg by mouth daily.         Marland Kitchen  Azelaic Acid (FINACEA EX)   Apply externally   Apply 1 application topically 2 (two) times daily.         . B Complex Vitamins (B COMPLEX PO)   Oral   Take 1 tablet by mouth daily.          . calcium carbonate (OS-CAL) 600 MG TABS   Oral   Take 600 mg by mouth daily.           . Cholecalciferol (VITAMIN D3) 2000 UNITS TABS   Oral   Take 1 tablet by mouth daily.         . diphenhydrAMINE (BENADRYL) 25 MG tablet   Oral   Take 25 mg by mouth at bedtime as needed for sleep.          Marland Kitchen esomeprazole (NEXIUM) 20 MG capsule   Oral   Take 40 mg by mouth daily at 12 noon.         . hydrochlorothiazide (HYDRODIURIL) 12.5 MG tablet   Oral   Take 12.5 mg by mouth daily.         Marland Kitchen HYDROcodone-acetaminophen (NORCO) 5-325 MG per tablet   Oral   Take 1 tablet by mouth every 6 (six) hours as needed for moderate pain.          Marland Kitchen losartan (COZAAR) 50 MG tablet   Oral   Take 1 tablet (50 mg total) by mouth daily.   90 tablet   3   . methocarbamol (ROBAXIN) 500 MG tablet   Oral   Take 1  tablet (500 mg total) by mouth every 8 (eight) hours as needed for muscle spasms.   180 tablet   3   . metoprolol tartrate (LOPRESSOR) 25 MG tablet   Oral   Take 1 tablet (25 mg total) by mouth 2 (two) times daily.   180 tablet   3   . Multiple Vitamin (MULTIVITAMIN) tablet   Oral   Take 1 tablet by mouth daily.           . sertraline (ZOLOFT) 100 MG tablet      TAKE 1 TABLET BY MOUTH DAILY   90 tablet   1   . spironolactone (ALDACTONE) 25 MG tablet   Oral   Take 25 mg by mouth daily.         Marland Kitchen tolterodine (DETROL) 2 MG tablet   Oral   Take 2 mg by mouth 2 (two) times daily.          BP 143/85  Pulse 84  Temp(Src) 98.7 F (37.1 C) (Oral)  Resp 20  SpO2 98% Physical Exam  Nursing note and vitals reviewed. Constitutional: She is oriented to person, place, and time. She appears well-developed and well-nourished. No distress.  HENT:  Head: Normocephalic and atraumatic.  Eyes: Conjunctivae and EOM are normal.  Cardiovascular: Normal rate and regular rhythm.   Pulmonary/Chest: Effort normal and breath sounds normal. No stridor. No respiratory distress.  Abdominal: She exhibits no distension.  Musculoskeletal: She exhibits no edema.  Neurological: She is alert and oriented to person, place, and time. No cranial nerve deficit. She exhibits normal muscle tone. Coordination normal.  Skin: Skin is warm and dry.  Psychiatric: She has a normal mood and affect.    ED Course  Procedures (including critical care time) Labs Review Labs Reviewed  BASIC METABOLIC PANEL - Abnormal; Notable for the following:    Glucose, Bld 118 (*)    All other components within normal  limits  CBC  POCT I-STAT TROPONIN I   Imaging Review Dg Chest 2 View  05/31/2013   CLINICAL DATA:  Syncope, fall  EXAM: CHEST  2 VIEW  COMPARISON:  None.  FINDINGS: Cardiomediastinal silhouette is unremarkable. There is dextroscoliosis of thoracic spine. Osteopenia and mild degenerative changes thoracic  spine. No acute infiltrate or pulmonary edema.  IMPRESSION: No active disease.  Dextroscoliosis thoracic spine.   Electronically Signed   By: Lahoma Crocker M.D.   On: 05/31/2013 14:58    EKG Interpretation    Date/Time:  Tuesday May 31 2013 12:37:52 EST Ventricular Rate:  82 PR Interval:  222 QRS Duration: 142 QT Interval:  432 QTC Calculation: 504 R Axis:   -42 Text Interpretation:  Sinus rhythm with 1st degree A-V block Left axis deviation Left bundle branch block Abnormal ECG Sinus rhythm with 1st degree A-V block Left bundle branch block No significant change since last tracing Abnormal ekg Confirmed by Carmin Muskrat  MD [phone]) on 05/31/2013 12:41:39 PM           I reviewed the electronic medical record.   Update: On repeat exam the patient appears comfortable. MDM   Patient presents after a non-prodromal episode of syncope.  Notably, the patient has had episodes of lightheadedness in the past, but no complete syncope.  Patient's history is notable for QT prolongation.  Given this episode of syncope following exercise, with no prodrome, although patient has largely reassuring initial evaluation in the emergency department, she required admission for further evaluation and management.    Carmin Muskrat, MD 05/31/13 [phone]

## 2013-05-31 NOTE — Telephone Encounter (Signed)
FYI

## 2013-05-31 NOTE — ED Notes (Signed)
Per pt sts this am she had a syncopal episode after she went swimming. sts she had dizziness and heaviness in her chest. Denies any of that currently. sts she just doesn't feel 100%. Denies injuring herself or hitting head when she had syncopal episode. sts someone helped her to the ground. Pt has some recent changes in medication.

## 2013-05-31 NOTE — Progress Notes (Signed)
Chief Complaint: Syncope  HPI: The patient is a 58 y/o female who presented to the [facility] ER today for evaluation of syncope. This has never happened to her before. She does have a history of dizziness and prolonged QT intervals but not syncope. She is followed by Dr. Gigi Gin at [facility]. She was first referred to him several years ago after an abnormal EKG as part of a work-up for pre-operative clearance. This led to a NST, which was abnormal. Subsequently she had a LHC, however, she was told that she had normal coronaries. This was around 2004-2005. He has also followed her for dizziness. He ordered a 24 hr event monitor that showed no arrythmias. He felt that her symptoms at that time were due to hypotension. He made adjustments to her antihypertensives and her dizziness improved. She hadn't had any problems since that time.  She states that she was in her usual state of health this am. She had just finished her morning workout (swimming) at the gym. As she was walking out the door, she noted sudden onset of dizziness. She then fainted. She states she lost consciousness for only a few seconds. Afterwards, she continued to feel dizzy. The dizziness lasted for about 20 minutes. No further syncope/ near syncope. She felt mild SSCP afterwards and it spontaneously resolved after a few minutes. She denies extremity numbness and weakness. No past history of stroke or TIA. She has diet controlled DM and is not on any hypoglycemics. She states that she ate breakfast this am and has stayed well hydrated. She denies any recent antibiotic use or change in any medications.   Past Medical History  Diagnosis Date  . Arthritis   . Chicken pox   . Headache, frequent episodic tension-type   . Cardiac arrhythmia due to congenital heart disease   . History of high blood pressure     readings  . High cholesterol   . Migraines   . Hx of colonoscopy sept 2012    normal,  next due 2022, [name]  .  Hypertension   . Depression   . Holter monitor, abnormal August 2012    done for long QT.  some contractile asynchrony [name])  . Morton's neuroma     bilateral    Past Surgical History  Procedure Laterality Date  . Appendectomy  1973  . Tonsillectomy and adenoidectomy  1966  . Spinal fusion  03/2007    ruptured disc  L5  [name]  . Hysteroscopy  2005    heavy bleeding  . Abdominal hysterectomy  Dec 2013    [facility]    Family History  Problem Relation Age of Onset  . Arthritis Mother   . Hyperlipidemia Mother   . Arthritis Father   . Cancer Father     prostate cancer   . Hyperlipidemia Father   . Cancer Other     ovarian,uterus  . Heart disease Other   . Stroke Other   . Learning disabilities Other   . Cirrhosis Mother   . Coronary artery disease Paternal Grandfather 31  . Pulmonary embolism Father 24   Social History:  reports that she has never smoked. She has never used smokeless tobacco. She reports that she does not drink alcohol or use illicit drugs.  Allergies:  Allergies  Allergen Reactions  . Cephalexin Anaphylaxis  . Atorvastatin     myalgias  . Penicillins   . Latex Rash     (Not in a hospital  admission)  Results for orders placed during the hospital encounter of 05/31/13 (from the past 48 hour(s))  CBC     Status: None   Collection Time    05/31/13  1:00 PM      Result Value Range   WBC 7.8  4.0 - 10.5 K/uL   RBC 4.61  3.87 - 5.11 MIL/uL   Hemoglobin 13.3  12.0 - 15.0 g/dL   HCT 38.3  36.0 - 46.0 %   MCV 83.1  78.0 - 100.0 fL   MCH 28.9  26.0 - 34.0 pg   MCHC 34.7  30.0 - 36.0 g/dL   RDW 13.7  11.5 - 15.5 %   Platelets 201  150 - 400 K/uL  BASIC METABOLIC PANEL     Status: Abnormal   Collection Time    05/31/13  1:00 PM      Result Value Range   Sodium 140  137 - 147 mEq/L   Potassium 4.3  3.7 - 5.3 mEq/L   Chloride 101  96 - 112 mEq/L   CO2 24  19 - 32 mEq/L   Glucose, Bld 118 (*) 70 - 99 mg/dL   BUN 16  6 - 23 mg/dL    Creatinine, Ser 0.65  0.50 - 1.10 mg/dL   Calcium 9.7  8.4 - 10.5 mg/dL   GFR calc non Af Amer >90  >90 mL/min   GFR calc Af Amer >90  >90 mL/min   Comment: (NOTE)     The eGFR has been calculated using the CKD EPI equation.     This calculation has not been validated in all clinical situations.     eGFR's persistently <90 mL/min signify possible Chronic Kidney     Disease.  POCT I-STAT TROPONIN I     Status: None   Collection Time    05/31/13  1:05 PM      Result Value Range   Troponin i, poc 0.00  0.00 - 0.08 ng/mL   Comment 3            Comment: Due to the release kinetics of cTnI,     a negative result within the first hours     of the onset of symptoms does not rule out     myocardial infarction with certainty.     If myocardial infarction is still suspected,     repeat the test at appropriate intervals.   Dg Chest 2 View  05/31/2013   CLINICAL DATA:  Syncope, fall  EXAM: CHEST  2 VIEW  COMPARISON:  None.  FINDINGS: Cardiomediastinal silhouette is unremarkable. There is dextroscoliosis of thoracic spine. Osteopenia and mild degenerative changes thoracic spine. No acute infiltrate or pulmonary edema.  IMPRESSION: No active disease.  Dextroscoliosis thoracic spine.   Electronically Signed   By: Lahoma Crocker M.D.   On: 05/31/2013 14:58    Review of Systems  Constitutional: Negative for fever and diaphoresis.  Respiratory: Negative for shortness of breath.   Cardiovascular: Positive for chest pain. Negative for palpitations and leg swelling.  Neurological: Positive for dizziness. Negative for loss of consciousness and weakness.  All other systems reviewed and are negative.    Blood pressure 129/73, pulse 90, temperature 98.1 F (36.7 C), temperature source Oral, resp. rate 22, SpO2 97.00%. Physical Exam  Constitutional: She is oriented to person, place, and time. She appears well-developed and well-nourished. No distress.  Neck: No JVD present. Carotid bruit is not present.    Cardiovascular: Normal rate, regular  rhythm and intact distal pulses.  Exam reveals friction rub. Exam reveals no gallop.   No murmur heard. Respiratory: Effort normal and breath sounds normal. No respiratory distress. She has no wheezes. She has no rales.  GI: Soft. Bowel sounds are normal. She exhibits no distension and no mass. There is no tenderness.  Musculoskeletal: She exhibits no edema.  Neurological: She is alert and oriented to person, place, and time.  Skin: Skin is warm and dry. She is not diaphoretic.  Psychiatric: She has a normal mood and affect. Her behavior is normal.     Assessment/Plan Principal Problem:   Syncope and collapse Active Problems:   History of Long QT syndrome   Plan: s/p syncopal episode earlier today, after exercise. Pt is currently stable and w/o symptoms. HR and BP both stable. Pt apparently has a past history of long QT syndrome. K and Mg both are WNL. No carotid bruits or murmurs appreciated. She is not anemic. We will admit to telemetry and will have EP see in the am. MD to follow.   Lyda Jester 05/31/2013, 6:45 PM   Attending Note:   The patient was seen and examined.  Agree with assessment and plan as noted above.  Changes made to the above note as needed.  She carries the Dx of Long QT.  This was diagnosed with her LBBB several years ago.   She has had a cath which revealed normal coronaries.   She now presents after having an episode of syncope after swimming.   No symptoms at present.  ECG reveals NSR with prolonged PR.  LBBB. QTc of 0.504.   I would not diagnose her with long QT based on this ECG but its possible that she had a longer QT in the past.   She does have conduction disease - long PR and LBBB.  She may have had temporary slowing  In her right bundle with complete heart block.  Agree with observation tonight.  She should be seen by EP tomorrow am   She is very stable at present.  Thayer Headings, Brooke Bonito., MD,  [facility] 05/31/2013, 6:58 PM Office - [phone] Pager [phone]

## 2013-05-31 NOTE — Progress Notes (Addendum)
Asking for Norco and methocarbomal for arthritis. Admitted with syncope, carries history of prolonged qt. Neither of these meds are on the avoid list. Will restart. Will hold sertraline and benadryl (on list) until seen by EP.

## 2013-05-31 NOTE — Telephone Encounter (Signed)
Pt has spoken with triage nurse and is being sent to ER, [facility].  Pt was in [place] at the gym, worked out, took shower, went to parking lot and passed out.  Triage states pt husband is coming to pick her up and taking her to ER now.

## 2013-06-01 LAB — BASIC METABOLIC PANEL
BUN: 16 mg/dL (ref 6–23)
CO2: 24 mEq/L (ref 19–32)
Calcium: 9.1 mg/dL (ref 8.4–10.5)
Chloride: 102 mEq/L (ref 96–112)
Creatinine, Ser: 0.66 mg/dL (ref 0.50–1.10)
GLUCOSE: 106 mg/dL — AB (ref 70–99)
POTASSIUM: 4 meq/L (ref 3.7–5.3)
Sodium: 142 mEq/L (ref 137–147)

## 2013-06-01 LAB — CBC
HCT: 37.4 % (ref 36.0–46.0)
Hemoglobin: 12.9 g/dL (ref 12.0–15.0)
MCH: 29.1 pg (ref 26.0–34.0)
MCHC: 34.5 g/dL (ref 30.0–36.0)
MCV: 84.2 fL (ref 78.0–100.0)
Platelets: 204 10*3/uL (ref 150–400)
RBC: 4.44 MIL/uL (ref 3.87–5.11)
RDW: 14 % (ref 11.5–15.5)
WBC: 9.6 10*3/uL (ref 4.0–10.5)

## 2013-06-01 MED ORDER — HYDROCHLOROTHIAZIDE 12.5 MG PO CAPS
12.5000 mg | ORAL_CAPSULE | Freq: Every day | ORAL | Status: DC
  Administered 2013-06-01 – 2013-06-03 (×2): 12.5 mg via ORAL
  Filled 2013-06-01 (×3): qty 1

## 2013-06-01 MED ORDER — OXYBUTYNIN CHLORIDE ER 5 MG PO TB24
5.0000 mg | ORAL_TABLET | Freq: Every day | ORAL | Status: DC
  Administered 2013-06-01 – 2013-06-02 (×2): 5 mg via ORAL
  Filled 2013-06-01 (×4): qty 1

## 2013-06-01 NOTE — Consult Note (Signed)
ELECTROPHYSIOLOGY CONSULT NOTE   Patient ID: Julia Pierce MRN: [id number], DOB/AGE: [birth year]   Admit date: 05/31/2013 Date of Consult: 06/01/2013  Primary Physician: Deborra Medina, MD Primary Cardiologist: [name], MD  Reason for Consultation: Syncope  History of Present Illness Julia Pierce is a 58 y.o. female with LVEF 45% by stress echo 2013, normal coronary arteries per patient s/p cath 2013 and HTN who presented to [facility] yesterday with syncope. She has never passed out before although she has had intermittent dizziness since June 2014 for which she wore a 24-hour Holter monitor which did not show any arrhythmias. However she had no symptoms while wearing this. She tell me that Dr. [name] felt her symptoms were due to hypotension and he decreased her metoprolol dose. Her dizziness improved and she has not had any recurrent problems since that time until yesterday. Yesterday she was leaving the gym after completing her morning swimming workout and while walking out the door she experienced the sudden onset of dizziness. She then passed out. Upon regaining consciousness, she was "really shaky" and continued to feel dizzy for about 20 minutes. She also noticed chest pressure afterward for about 2-3 minutes. She denies palpitations or SOB. She has otherwise been in her usual state of health without any recent illness or fever or changes to her medications.    Past Medical History Past Medical History  Diagnosis Date  . Arthritis   . Chicken pox   . Headache, frequent episodic tension-type   . Cardiac arrhythmia due to congenital heart disease   . History of high blood pressure     readings  . High cholesterol   . Migraines   . Hx of colonoscopy sept 2012    normal,  next due 2022, [name]  . Hypertension   . Depression   . Holter monitor, abnormal August 2012    done for long QT.  some contractile asynchrony [name])  . Morton's neuroma     bilateral    Past Surgical  History Past Surgical History  Procedure Laterality Date  . Appendectomy  1973  . Tonsillectomy and adenoidectomy  1966  . Spinal fusion  03/2007    ruptured disc  L5  [name]  . Hysteroscopy  2005    heavy bleeding  . Abdominal hysterectomy  Dec 2013    [place]    Allergies/Intolerances Allergies  Allergen Reactions  . Cephalexin Anaphylaxis  . Atorvastatin     myalgias  . Penicillins   . Latex Rash    Current Home Medications      aspirin EC 81 MG tablet  Take 81 mg by mouth daily.     B COMPLEX PO  Take 1 tablet by mouth daily.     calcium carbonate 600 MG Tabs tablet  Commonly known as:  OS-CAL  Take 600 mg by mouth daily.     diphenhydrAMINE 25 MG tablet  Commonly known as:  BENADRYL  Take 25 mg by mouth at bedtime as needed for sleep.     esomeprazole 20 MG capsule  Commonly known as:  NEXIUM  Take 40 mg by mouth daily at 12 noon.     FINACEA EX  Apply 1 application topically 2 (two) times daily.     hydrochlorothiazide 12.5 MG tablet  Commonly known as:  HYDRODIURIL  Take 12.5 mg by mouth daily.     HYDROcodone-acetaminophen 5-325 MG per tablet  Commonly known as:  NORCO/VICODIN  Take 1 tablet by mouth every 6 (six) hours  as needed for moderate pain.     losartan 50 MG tablet  Commonly known as:  COZAAR  Take 1 tablet (50 mg total) by mouth daily.     methocarbamol 500 MG tablet  Commonly known as:  ROBAXIN  Take 1 tablet (500 mg total) by mouth every 8 (eight) hours as needed for muscle spasms.     metoprolol tartrate 25 MG tablet  Commonly known as:  LOPRESSOR  Take 1 tablet (25 mg total) by mouth 2 (two) times daily.     multivitamin tablet  Take 1 tablet by mouth daily.     sertraline 100 MG tablet  Commonly known as:  ZOLOFT  TAKE 1 TABLET BY MOUTH DAILY     spironolactone 25 MG tablet  Commonly known as:  ALDACTONE  Take 25 mg by mouth daily.     tolterodine 2 MG tablet  Commonly known as:  DETROL  Take 2 mg by mouth 2 (two)  times daily.     Vitamin D3 2000 UNITS Tabs  Take 1 tablet by mouth daily.     Inpatient Medications . aspirin EC  81 mg Oral Daily  . calcium carbonate  1 tablet Oral Q breakfast  . heparin  5,000 Units Subcutaneous 3 times per day  . hydrochlorothiazide  12.5 mg Oral Daily  . losartan  50 mg Oral Daily  . metoprolol tartrate  25 mg Oral BID  . pantoprazole  40 mg Oral Daily  . sodium chloride  3 mL Intravenous Q12H  . spironolactone  25 mg Oral Daily    Family History Family History  Problem Relation Age of Onset  . Arthritis Mother   . Hyperlipidemia Mother   . Arthritis Father   . Cancer Father     prostate cancer   . Hyperlipidemia Father   . Cancer Other     ovarian,uterus  . Heart disease Other   . Stroke Other   . Learning disabilities Other   . Cirrhosis Mother   . Coronary artery disease Paternal Grandfather 12  . Pulmonary embolism Father 103     Social History History   Social History  . Marital Status: Married    Spouse Name: N/A    Number of Children: N/A  . Years of Education: N/A   Occupational History  . Not on file.   Social History Main Topics  . Smoking status: Never Smoker   . Smokeless tobacco: Never Used  . Alcohol Use: No  . Drug Use: No  . Sexual Activity: Not on file   Other Topics Concern  . Not on file   Social History Narrative  . No narrative on file     Review of Systems General: No chills, fever, night sweats or weight changes  Cardiovascular:  No chest pain, dyspnea on exertion, edema, orthopnea, palpitations, paroxysmal nocturnal dyspnea Dermatological: No rash, lesions or masses Respiratory: No cough, dyspnea Urologic: No hematuria, dysuria Abdominal: No nausea, vomiting, diarrhea, bright red blood per rectum, melena, or hematemesis Neurologic: No visual changes, weakness, changes in mental status All other systems reviewed and are otherwise negative except as noted above.  Physical Exam Vitals: Blood pressure  120/64, pulse 71, temperature 97.5 F (36.4 C), temperature source Oral, resp. rate 18, height 5\' 2"  (1.575 m), weight 178 lb 6.4 oz (80.922 kg), SpO2 96.00%.  General: Well developed, well appearing 58 y.o. female in no acute distress. HEENT: Normocephalic, atraumatic. EOMs intact. Sclera nonicteric. Oropharynx clear.  Neck: Supple. No  JVD. Lungs: Respirations regular and unlabored, CTA bilaterally. No wheezes, rales or rhonchi. Heart: RRR. S1, S2 present. No murmurs, rub, S3 or S4. Abdomen: Soft, non-tender, non-distended. BS present x 4 quadrants. No hepatosplenomegaly.  Extremities: No clubbing, cyanosis or edema. DP/PT/Radials 2+ and equal bilaterally. Psych: Normal affect. Neuro: Alert and oriented X 3. Moves all extremities spontaneously. Musculoskeletal: No kyphosis. Skin: Intact. Warm and dry. No rashes or petechiae in exposed areas.   Labs Troponin 0.00 Lab Results  Component Value Date   WBC 9.6 06/01/2013   HGB 12.9 06/01/2013   HCT 37.4 06/01/2013   MCV 84.2 06/01/2013   PLT 204 06/01/2013    Recent Labs Lab 06/01/13 0405  NA 142  K 4.0  CL 102  CO2 24  BUN 16  CREATININE 0.66  CALCIUM 9.1  GLUCOSE 106*   Lab Results  Component Value Date   CHOL 237* 05/09/2013   HDL 42.90 05/09/2013   TRIG 371.0* 05/09/2013    Radiology/Studies Dg Chest 2 View 05/31/2013   FINDINGS: Cardiomediastinal silhouette is unremarkable. There is dextroscoliosis of thoracic spine. Osteopenia and mild degenerative changes thoracic spine. No acute infiltrate or pulmonary edema.   IMPRESSION: No active disease.  Dextroscoliosis thoracic spine.    Electronically Signed   By: Lahoma Crocker M.D.   On: 05/31/2013 14:58   Stress echocardiogram Nov 2013 Abnormal stress echo with mild global LV systolic dysfunction, EF 70%. Peak stress shows mild global LV dysfunction, EF 45%. ECG described as NSR with nonspecific ST-T wave abnormalities. No LBBB mentioned.   12-lead ECG on admission - SR with first  degree AV block and LBBB  Telemetry reviewed - SR; no arrhythmias  Assessment and Plan 1. Syncope 2. LBBB, ? new as stress echo report from Nov 2013 [name], MD) does not mention LBBB in ECG description; however, Julia Pierce states this was diagnosed at the time of her cardiac cath in 2007 3. History of long QT  4. History of LV dysfunction, EF 40-45% by stress echo Nov 2013 5. Normal coronary arteries s/p cardiac cath 2007  Julia Pierce presents with unexplained syncope. We will obtain records from Dr. [name]. We will update her echo to assess LVEF. She has underlying conduction system disease with LBBB and development of high grade AV block is a probable cause. Permanent pacing is a class IIA indication in setting of unexplained syncope and LBBB (if normal LVEF) if clinical suspicion is high and there are no reversible causes identified. If her LVEF is decreased, she will require an ischemic work-up with EP recommendations pending results of an ischemic evaluation. For now, recommend discontinuing beta blocker. Await echo results and if LVEF remains normal, consider ILR insertion for monitoring her rate and rhythm off BB to determine if correlates or not to arrhythmia should she have recurrence.   Signed, Ileene Hutchinson, PA-C 06/01/2013, 11:38 AM

## 2013-06-01 NOTE — Consult Note (Signed)
Pt has abrupt onset and offset syncope with antecedent hx of similar presyncope without LOC  All spell are measured in seconds  She has hx of tachypalp unassoc with LH or SOB    She has known LBBB dating back 5+ years at which time a cardiomyopathy was identified with EF about 35%; cath was clean.  Subsequent eval have been 40 or so incl just last month  She has no limitations in exercise tolerance  No fam Hx of CM or SCD  I will review with colleagues but with CM despite Guideline directed medical therapy with EF 40 and LBBB i think  She is too high risk for loop recorder and with a 2a indication for pacing and her cardiomyopathy I would favor dual chamber ICD implantation  Her son and daughter in law are MDs and will be speaking with them  No driving x 6 mo

## 2013-06-01 NOTE — Progress Notes (Signed)
UR completed 

## 2013-06-01 NOTE — Progress Notes (Signed)
Authorization of release of medical information faxed to [facility] and [facility] for EKG, ECHO and CATH results. Spoke with both facilities and let them know that we needed records back STAT for continuity of care of patient. Dorna Bloom, RN

## 2013-06-02 ENCOUNTER — Encounter (HOSPITAL_COMMUNITY)
Admission: EM | Disposition: A | Discharge status: Home / Self Care | Payer: Self-pay | Source: Home / Self Care | Attending: Internal Medicine

## 2013-06-02 DIAGNOSIS — R0989 Other specified symptoms and signs involving the circulatory and respiratory systems: Secondary | ICD-10-CM

## 2013-06-02 DIAGNOSIS — I509 Heart failure, unspecified: Secondary | ICD-10-CM

## 2013-06-02 DIAGNOSIS — R0609 Other forms of dyspnea: Secondary | ICD-10-CM

## 2013-06-02 DIAGNOSIS — R55 Syncope and collapse: Secondary | ICD-10-CM

## 2013-06-02 DIAGNOSIS — I428 Other cardiomyopathies: Secondary | ICD-10-CM

## 2013-06-02 DIAGNOSIS — I447 Left bundle-branch block, unspecified: Secondary | ICD-10-CM

## 2013-06-02 DIAGNOSIS — I519 Heart disease, unspecified: Secondary | ICD-10-CM

## 2013-06-02 HISTORY — PX: IMPLANTABLE CARDIOVERTER DEFIBRILLATOR IMPLANT: SHX5473

## 2013-06-02 SURGERY — IMPLANTABLE CARDIOVERTER DEFIBRILLATOR IMPLANT
Anesthesia: LOCAL

## 2013-06-02 MED ORDER — FENTANYL CITRATE 0.05 MG/ML IJ SOLN
INTRAMUSCULAR | Status: AC
  Filled 2013-06-02: qty 2

## 2013-06-02 MED ORDER — MIDAZOLAM HCL 5 MG/5ML IJ SOLN
INTRAMUSCULAR | Status: AC
  Filled 2013-06-02: qty 5

## 2013-06-02 MED ORDER — ACETAMINOPHEN 325 MG PO TABS
325.0000 mg | ORAL_TABLET | ORAL | Status: DC | PRN
  Administered 2013-06-02: 650 mg via ORAL
  Filled 2013-06-02: qty 2

## 2013-06-02 MED ORDER — HEPARIN (PORCINE) IN NACL 2-0.9 UNIT/ML-% IJ SOLN
INTRAMUSCULAR | Status: AC
  Filled 2013-06-02: qty 1000

## 2013-06-02 MED ORDER — SODIUM CHLORIDE 0.9 % IR SOLN
80.0000 mg | Status: DC
  Filled 2013-06-02: qty 2

## 2013-06-02 MED ORDER — YOU HAVE A PACEMAKER BOOK: Freq: Once | Status: DC

## 2013-06-02 MED ORDER — CHLORHEXIDINE GLUCONATE 4 % EX LIQD: 60.0000 mL | Freq: Once | CUTANEOUS | Status: DC

## 2013-06-02 MED ORDER — VANCOMYCIN HCL IN DEXTROSE 1-5 GM/200ML-% IV SOLN
1000.0000 mg | INTRAVENOUS | Status: DC
  Filled 2013-06-02: qty 200

## 2013-06-02 MED ORDER — MIDAZOLAM HCL 5 MG/5ML IJ SOLN
INTRAMUSCULAR | Status: AC
Start: 2013-06-02 — End: 2013-06-02
  Filled 2013-06-02: qty 5

## 2013-06-02 MED ORDER — PERFLUTREN LIPID MICROSPHERE
INTRAVENOUS | Status: AC
  Filled 2013-06-02: qty 10

## 2013-06-02 MED ORDER — SODIUM CHLORIDE 0.9 % IV SOLN: INTRAVENOUS | Status: DC

## 2013-06-02 MED ORDER — LIDOCAINE HCL (PF) 1 % IJ SOLN
INTRAMUSCULAR | Status: AC
  Filled 2013-06-02: qty 60

## 2013-06-02 MED ORDER — ONDANSETRON HCL 4 MG/2ML IJ SOLN: 4.0000 mg | Freq: Four times a day (QID) | INTRAMUSCULAR | Status: DC | PRN

## 2013-06-02 MED ORDER — PERFLUTREN LIPID MICROSPHERE
1.0000 mL | INTRAVENOUS | Status: AC | PRN
  Administered 2013-06-02: 2 mL via INTRAVENOUS
  Filled 2013-06-02: qty 10

## 2013-06-02 MED ORDER — VANCOMYCIN HCL IN DEXTROSE 1-5 GM/200ML-% IV SOLN
1000.0000 mg | Freq: Two times a day (BID) | INTRAVENOUS | Status: AC
  Administered 2013-06-03: 1000 mg via INTRAVENOUS
  Filled 2013-06-02: qty 200

## 2013-06-02 NOTE — CV Procedure (Signed)
BiV PM insertion via the left subclavian vein without immediate complication. [id number].

## 2013-06-02 NOTE — Discharge Instructions (Signed)
° °  Supplemental Discharge Instructions for  Pacemaker/Defibrillator Patients  Activity No heavy lifting or vigorous activity with your left/right arm for 6 to 8 weeks.  Do not raise your left/right arm above your head for one week.  Gradually raise your affected arm as drawn below.           02/15                       02/16                       02/17                     02/18       NO DRIVING for at least 6 months, until given clearance by Dr. Caryl Comes. WOUND CARE   Keep the wound area clean and dry.  Do not get this area wet for one week. No showers for one week; you may shower on 06/11/2013.   The tape/steri-strips on your wound will fall off; do not pull them off.  No bandage is needed on the site.  DO  NOT apply any creams, oils, or ointments to the wound area.   If you notice any drainage or discharge from the wound, any swelling or bruising at the site, or you develop a fever > 101? F after you are discharged home, call the office at once.  Special Instructions   You are still able to use cellular telephones; use the ear opposite the side where you have your pacemaker/defibrillator.  Avoid carrying your cellular phone near your device.   When traveling through airports, show security personnel your identification card to avoid being screened in the metal detectors.  Ask the security personnel to use the hand wand.   Avoid arc welding equipment, MRI testing (magnetic resonance imaging), TENS units (transcutaneous nerve stimulators).  Call the office for questions about other devices.   Avoid electrical appliances that are in poor condition or are not properly grounded.   Microwave ovens are safe to be near or to operate.

## 2013-06-02 NOTE — Progress Notes (Signed)
Patient: Julia Pierce Date of Encounter: 06/02/2013, 11:27 AM Admit date: 05/31/2013     Subjective  Julia Pierce has no complaints.   Objective  Physical Exam: Vitals: BP 124/64  Pulse 70  Temp(Src) 97.8 F (36.6 C) (Oral)  Resp 18  Ht 5\' 2"  (1.575 m)  Wt 178 lb 1.6 oz (80.785 kg)  BMI 32.57 kg/m2  SpO2 95% General: Well developed, well appearing 58 year old female in no acute distress. Neck: Supple. JVD not elevated. Lungs: Clear bilaterally to auscultation without wheezes, rales, or rhonchi. Breathing is unlabored. Heart: RRR S1 S2 without murmurs, rubs, or gallops.  Abdomen: Soft, non-distended. Extremities: No clubbing or cyanosis. No edema.  Distal pedal pulses are 2+ and equal bilaterally. Neuro: Alert and oriented X 3. Moves all extremities spontaneously. No focal deficits.  Intake/Output:  Intake/Output Summary (Last 24 hours) at 06/02/13 1127 Last data filed at 06/02/13 0900  Gross per 24 hour  Intake      0 ml  Output      0 ml  Net      0 ml    Inpatient Medications:  . aspirin EC  81 mg Oral Daily  . calcium carbonate  1 tablet Oral Q breakfast  . heparin  5,000 Units Subcutaneous 3 times per day  . hydrochlorothiazide  12.5 mg Oral Daily  . losartan  50 mg Oral Daily  . metoprolol tartrate  25 mg Oral BID  . oxybutynin  5 mg Oral QHS  . pantoprazole  40 mg Oral Daily  . sodium chloride  3 mL Intravenous Q12H  . spironolactone  25 mg Oral Daily    Labs:  Recent Labs  05/31/13 1300 06/01/13 0405  NA 140 142  K 4.3 4.0  CL 101 102  CO2 24 24  GLUCOSE 118* 106*  BUN 16 16  CREATININE 0.65 0.66  CALCIUM 9.7 9.1    Recent Labs  05/31/13 1300 06/01/13 0405  WBC 7.8 9.6  HGB 13.3 12.9  HCT 38.3 37.4  MCV 83.1 84.2  PLT 201 204    Radiology/Studies: Dg Chest 2 View 05/31/2013     FINDINGS: Cardiomediastinal silhouette is unremarkable. There is dextroscoliosis of thoracic spine. Osteopenia and mild degenerative changes thoracic  spine. No acute infiltrate or pulmonary edema.   IMPRESSION: No active disease.  Dextroscoliosis thoracic spine.    Electronically Signed   By: Lahoma Crocker M.D.   On: 05/31/2013 14:58   Echocardiogram today  Study Conclusions - Left ventricle: The cavity size was normal. Systolic function was normal. The estimated ejection fraction was in the range of 55% to 60%. There was an increased relative contribution of atrial contraction to ventricular filling. Doppler parameters are consistent with abnormal left ventricular relaxation (grade 1 diastolic dysfunction). - Ventricular septum: Septal motion showed paradox.  Telemetry: SR   Assessment and Plan  1. Syncope  2. LBBB 3. History of LV dysfunction, EF 40-45% by stress echo Nov 2013 - normal LVEF by echo this admission  4. Normal coronary arteries s/p cardiac cath 2007  Julia Pierce presents with unexplained syncope. Records were reviewed from Dr. [name]. We have updated her echo today and her LVEF is normal. She has underlying conduction system disease with LBBB and development of high grade AV block is a probable cause. Permanent pacing is a class IIA indication in setting of unexplained syncope and LBBB if clinical suspicion is high and there are no reversible causes identified. She has taken low  dose metoprolol for years. [name] will review echo and provide recommendations regarding possible BiV PPM.   Signed, EDMISTEN, BROOKE PA-C  EP Attending  Patient seen and examined. Agree with above. She has an EF of 45% after echo contrast was placed and all walls could be adequately visualized. She has class 2 CHF. She has had syncope in the setting of LBBB. I suspect that she will pace in the ventricle in the future. 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 Will proceed with BiV PPM insertion. I have discussed the risks/benefits/goals/expectations of the procedure and she wishes to proceed.   Mikle Bosworth.D.

## 2013-06-02 NOTE — Progress Notes (Signed)
Limited echo complete

## 2013-06-02 NOTE — Progress Notes (Signed)
  Echocardiogram 2D Echocardiogram has been performed.  [place], [place] 06/02/2013, 9:03 AM

## 2013-06-03 ENCOUNTER — Inpatient Hospital Stay (HOSPITAL_COMMUNITY): Payer: BC Managed Care – PPO

## 2013-06-03 NOTE — Discharge Summary (Signed)
ELECTROPHYSIOLOGY PROCEDURE DISCHARGE SUMMARY    Patient ID: Julia Pierce,  MRN: [id number], DOB/AGE: Feb 12, 1956 58 y.o.  Admit date: 05/31/2013 Discharge date: 06/03/2013  Primary Care Physician: Deborra Medina, MD Primary Cardiologist: new to Dr. Cristopher Peru  Primary Discharge Diagnosis:  Non ischemic cardiomyopathy, left bundle branch block, and syncope status post CRTP implantation this admission  Secondary Discharge Diagnosis:  1.  Hypertension 2.  Hyperlipidemia 3.  Previously documented long QT syndrome  Allergies  Allergen Reactions  . Cephalexin Anaphylaxis  . Atorvastatin     myalgias  . Penicillins   . Latex Rash     Procedures This Admission:  1.  Implantation of a [employer] CRTP on 06-02-2013 by Dr Lovena Le.  The patient recived a MDT CRTP with model number 5076 right atrial lead, 5076 right ventricular lead, and 4194 left ventricular lead. There were no early apparent complications.  2.  CXR on 06-03-2013 demonstrated no pneumothorax following device implantation.  3.  Echocardiogram on 06-02-2013 demonstrated EF 45%  Brief HPI/Hosp Course: Julia Pierce is a 58 y.o. female with LVEF 45% by stress echo 2013, normal coronary arteries per patient s/p cath 2013 and HTN who presented to [facility] with syncope. She denies prior syncopal events.  She does complain of intermittent dizziness for which she had previously been evaluated with holter monitor that was unrevealing.  She was evaluated by electrophysiology who recommended CRTP implantation with known LV dysfunction, LBBB, and syncope.  She underwent this procedure on 06-02-2013 with details as outlined above.  She was monitored on telemetry overnight which demonstrated sinus rhythm with ventricular pacing.  CXR was obtained and demonstrated no pneumothorax status post device implantation.  Her left chest was without hematoma or ecchymosis.  She was evaluated by Dr Lovena Le and considered stable for discharge to home.     Discharge Vitals: Blood pressure 122/73, pulse 67, temperature 97.9 F (36.6 C), temperature source Oral, resp. rate 18, height 5\' 2"  (1.575 m), weight 178 lb 1.6 oz (80.785 kg), SpO2 96.00%.    Labs:   Lab Results  Component Value Date   WBC 9.6 06/01/2013   HGB 12.9 06/01/2013   HCT 37.4 06/01/2013   MCV 84.2 06/01/2013   PLT 204 06/01/2013    Recent Labs Lab 06/01/13 0405  NA 142  K 4.0  CL 102  CO2 24  BUN 16  CREATININE 0.66  CALCIUM 9.1  GLUCOSE 106*    Discharge Medications:    Medication List         aspirin EC 81 MG tablet  Take 81 mg by mouth daily.     B COMPLEX PO  Take 1 tablet by mouth daily.     calcium carbonate 600 MG Tabs tablet  Commonly known as:  OS-CAL  Take 600 mg by mouth daily.     diphenhydrAMINE 25 MG tablet  Commonly known as:  BENADRYL  Take 25 mg by mouth at bedtime as needed for sleep.     esomeprazole 20 MG capsule  Commonly known as:  NEXIUM  Take 40 mg by mouth daily at 12 noon.     FINACEA EX  Apply 1 application topically 2 (two) times daily.     hydrochlorothiazide 12.5 MG tablet  Commonly known as:  HYDRODIURIL  Take 12.5 mg by mouth daily.     HYDROcodone-acetaminophen 5-325 MG per tablet  Commonly known as:  NORCO/VICODIN  Take 1 tablet by mouth every 6 (six) hours as needed for moderate pain.  losartan 50 MG tablet  Commonly known as:  COZAAR  Take 1 tablet (50 mg total) by mouth daily.     methocarbamol 500 MG tablet  Commonly known as:  ROBAXIN  Take 1 tablet (500 mg total) by mouth every 8 (eight) hours as needed for muscle spasms.     metoprolol tartrate 25 MG tablet  Commonly known as:  LOPRESSOR  Take 1 tablet (25 mg total) by mouth 2 (two) times daily.     multivitamin tablet  Take 1 tablet by mouth daily.     sertraline 100 MG tablet  Commonly known as:  ZOLOFT  TAKE 1 TABLET BY MOUTH DAILY     spironolactone 25 MG tablet  Commonly known as:  ALDACTONE  Take 25 mg by mouth daily.      tolterodine 2 MG tablet  Commonly known as:  DETROL  Take 2 mg by mouth 2 (two) times daily.     Vitamin D3 2000 UNITS Tabs  Take 1 tablet by mouth daily.        Disposition:       Future Appointments Provider Department Dept Phone   06/15/2013 10:00 AM [facility] [phone]   08/17/2013 8:30 AM [facility] [phone]   08/19/2013 9:30 AM Crecencio Mc, MD [place] [phone]   09/15/2013 9:00 AM Deboraha Sprang, MD [facility] [phone]     Follow-up Information   Follow up with [facility] On 06/15/2013. (At 10:00 AM for wound check)    Specialty:  Cardiology   Contact information:   [address] [place] [address] [phone]      Follow up with Virl Axe, MD On 09/15/2013. (At 9:00 AM)    Specialty:  Cardiology   Contact information:   [address]   [place] [id number] [phone]       Duration of Discharge Encounter: Greater than 30 minutes including physician time.  Signed,

## 2013-06-03 NOTE — H&P (Signed)
Chief Complaint: Syncope   HPI: The patient is a 58 y/o female who presented to the [facility] ER today for evaluation of syncope. This has never happened to her before. She does have a history of dizziness and prolonged QT intervals but not syncope. She is followed by Dr. Gigi Gin at [facility]. She was first referred to him several years ago after an abnormal EKG as part of a work-up for pre-operative clearance. This led to a NST, which was abnormal. Subsequently she had a LHC, however, she was told that she had normal coronaries. This was around 2004-2005. He has also followed her for dizziness. He ordered a 24 hr event monitor that showed no arrythmias. He felt that her symptoms at that time were due to hypotension. He made adjustments to her antihypertensives and her dizziness improved. She hadn't had any problems since that time.   She states that she was in her usual state of health this am. She had just finished her morning workout (swimming) at the gym. As she was walking out the door, she noted sudden onset of dizziness. She then fainted. She states she lost consciousness for only a few seconds. Afterwards, she continued to feel dizzy. The dizziness lasted for about 20 minutes. No further syncope/ near syncope. She felt mild SSCP afterwards and it spontaneously resolved after a few minutes. She denies extremity numbness and weakness. No past history of stroke or TIA. She has diet controlled DM and is not on any hypoglycemics. She states that she ate breakfast this am and has stayed well hydrated. She denies any recent antibiotic use or change in any medications.  Past Medical History   Diagnosis  Date   .  Arthritis    .  Chicken pox    .  Headache, frequent episodic tension-type    .  Cardiac arrhythmia due to congenital heart disease    .  History of high blood pressure      readings   .  High cholesterol    .  Migraines    .  Hx of colonoscopy  sept 2012     normal, next due 2022, [name]    .  Hypertension    .  Depression    .  Holter monitor, abnormal  August 2012     done for long QT. some contractile asynchrony [name])   .  Morton's neuroma      bilateral    Past Surgical History   Procedure  Laterality  Date   .  Appendectomy   1973   .  Tonsillectomy and adenoidectomy   1966   .  Spinal fusion   03/2007     ruptured disc L5 [name]   .  Hysteroscopy   2005     heavy bleeding   .  Abdominal hysterectomy   Dec 2013     [place]    Family History   Problem  Relation  Age of Onset   .  Arthritis  Mother    .  Hyperlipidemia  Mother    .  Arthritis  Father    .  Cancer  Father      prostate cancer   .  Hyperlipidemia  Father    .  Cancer  Other      ovarian,uterus   .  Heart disease  Other    .  Stroke  Other    .  Learning disabilities  Other    .  Cirrhosis  Mother    .  Coronary artery disease  Paternal Grandfather  41   .  Pulmonary embolism  Father  6    Social History: reports that she has never smoked. She has never used smokeless tobacco. She reports that she does not drink alcohol or use illicit drugs.  Allergies:  Allergies   Allergen  Reactions   .  Cephalexin  Anaphylaxis   .  Atorvastatin      myalgias   .  Penicillins    .  Latex  Rash     (Not in a hospital admission)  Results for orders placed during the hospital encounter of 05/31/13 (from the past 48 hour(s))   CBC Status: None    Collection Time    05/31/13 1:00 PM   Result  Value  Range    WBC  7.8  4.0 - 10.5 K/uL    RBC  4.61  3.87 - 5.11 MIL/uL    Hemoglobin  13.3  12.0 - 15.0 g/dL    HCT  38.3  36.0 - 46.0 %    MCV  83.1  78.0 - 100.0 fL    MCH  28.9  26.0 - 34.0 pg    MCHC  34.7  30.0 - 36.0 g/dL    RDW  13.7  11.5 - 15.5 %    Platelets  201  150 - 400 K/uL   BASIC METABOLIC PANEL Status: Abnormal    Collection Time    05/31/13 1:00 PM   Result  Value  Range    Sodium  140  137 - 147 mEq/L    Potassium  4.3  3.7 - 5.3 mEq/L    Chloride  101  96 - 112  mEq/L    CO2  24  19 - 32 mEq/L    Glucose, Bld  118 (*)  70 - 99 mg/dL    BUN  16  6 - 23 mg/dL    Creatinine, Ser  0.65  0.50 - 1.10 mg/dL    Calcium  9.7  8.4 - 10.5 mg/dL    GFR calc non Af Amer  >90  >90 mL/min    GFR calc Af Amer  >90  >90 mL/min    Comment:  (NOTE)     The eGFR has been calculated using the CKD EPI equation.     This calculation has not been validated in all clinical situations.     eGFR's persistently <90 mL/min signify possible Chronic Kidney     Disease.   POCT I-STAT TROPONIN I Status: None    Collection Time    05/31/13 1:05 PM   Result  Value  Range    Troponin i, poc  0.00  0.00 - 0.08 ng/mL    Comment 3      Comment:  Due to the release kinetics of cTnI,     a negative result within the first hours     of the onset of symptoms does not rule out     myocardial infarction with certainty.     If myocardial infarction is still suspected,     repeat the test at appropriate intervals.    Dg Chest 2 View  05/31/2013 CLINICAL DATA: Syncope, fall EXAM: CHEST 2 VIEW COMPARISON: None. FINDINGS: Cardiomediastinal silhouette is unremarkable. There is dextroscoliosis of thoracic spine. Osteopenia and mild degenerative changes thoracic spine. No acute infiltrate or pulmonary edema. IMPRESSION: No active disease. Dextroscoliosis thoracic spine. Electronically Signed By: Lahoma Crocker M.D. On:  05/31/2013 14:58   Review of Systems  Constitutional: Negative for fever and diaphoresis.  Respiratory: Negative for shortness of breath.  Cardiovascular: Positive for chest pain. Negative for palpitations and leg swelling.  Neurological: Positive for dizziness. Negative for loss of consciousness and weakness.  All other systems reviewed and are negative.   Blood pressure 129/73, pulse 90, temperature 98.1 F (36.7 C), temperature source Oral, resp. rate 22, SpO2 97.00%.   Physical Exam  Constitutional: She is oriented to person, place, and time. She appears well-developed and  well-nourished. No distress.  Neck: No JVD present. Carotid bruit is not present.  Cardiovascular: Normal rate, regular rhythm and intact distal pulses. Exam reveals friction rub. Exam reveals no gallop.  No murmur heard.  Respiratory: Effort normal and breath sounds normal. No respiratory distress. She has no wheezes. She has no rales.  GI: Soft. Bowel sounds are normal. She exhibits no distension and no mass. There is no tenderness.  Musculoskeletal: She exhibits no edema.  Neurological: She is alert and oriented to person, place, and time.  Skin: Skin is warm and dry. She is not diaphoretic.  Psychiatric: She has a normal mood and affect. Her behavior is normal.   Assessment/Plan  Principal Problem:  Syncope and collapse    History of Long QT syndrome  Plan: s/p syncopal episode earlier today, after exercise. Pt is currently stable and w/o symptoms. HR and BP both stable. Pt apparently has a past history of long QT syndrome. K and Mg both are WNL. No carotid bruits or murmurs appreciated. She is not anemic. We will admit to telemetry and will have EP see in the am. MD to follow.  Lyda Jester  05/31/2013, 6:45 PM  Attending Note:  The patient was seen and examined. Agree with assessment and plan as noted above. Changes made to the above note as needed.  She carries the Dx of Long QT. This was diagnosed with her LBBB several years ago. She has had a cath which revealed normal coronaries. She now presents after having an episode of syncope after swimming.  No symptoms at present.  ECG reveals NSR with prolonged PR. LBBB. QTc of 0.504.  I would not diagnose her with long QT based on this ECG but its possible that she had a longer QT in the past.  She does have conduction disease - long PR and LBBB. She may have had temporary slowing In her right bundle with complete heart block.   Agree with observation tonight. She should be seen by EP tomorrow am  She is very stable at present.     Thayer Headings, Brooke Bonito., MD, [facility]  05/31/2013, 6:58 PM  Office - [phone]  Pager [phone]

## 2013-06-03 NOTE — Progress Notes (Signed)
   ELECTROPHYSIOLOGY ROUNDING NOTE    Patient Name: Julia Pierce Date of Encounter: 06/03/2013    SUBJECTIVE:Patient feels well today.  No chest pain or shortness of breath.  S/p CRTP implant 06-02-2013  TELEMETRY: Reviewed telemetry pt in sinus rhythm with ventricular pacing Filed Vitals:   06/02/13 2030 06/02/13 2100 06/02/13 2200 06/03/13 0429  BP: 122/79 130/76 111/69 122/73  Pulse: 78 85 76 67  Temp:  97.6 F (36.4 C)  97.9 F (36.6 C)  TempSrc:  Oral  Oral  Resp:  18  18  Height:      Weight:      SpO2: 99% 97% 95% 96%    Intake/Output Summary (Last 24 hours) at 06/03/13 0654 Last data filed at 06/02/13 1300  Gross per 24 hour  Intake      0 ml  Output      0 ml  Net      0 ml    CURRENT MEDICATIONS: . aspirin EC  81 mg Oral Daily  . calcium carbonate  1 tablet Oral Q breakfast  . heparin  5,000 Units Subcutaneous 3 times per day  . hydrochlorothiazide  12.5 mg Oral Daily  . losartan  50 mg Oral Daily  . metoprolol tartrate  25 mg Oral BID  . oxybutynin  5 mg Oral QHS  . pantoprazole  40 mg Oral Daily  . sodium chloride  3 mL Intravenous Q12H  . spironolactone  25 mg Oral Daily  . you have a pacemaker book   Does not apply Once    LABS: Basic Metabolic Panel:  Recent Labs  05/31/13 1300 06/01/13 0405  NA 140 142  K 4.3 4.0  CL 101 102  CO2 24 24  GLUCOSE 118* 106*  BUN 16 16  CREATININE 0.65 0.66  CALCIUM 9.7 9.1   CBC:  Recent Labs  05/31/13 1300 06/01/13 0405  WBC 7.8 9.6  HGB 13.3 12.9  HCT 38.3 37.4  MCV 83.1 84.2  PLT 201 204    Radiology/Studies:  Dg Chest 2 View 05/31/2013   CLINICAL DATA:  Syncope, fall  EXAM: CHEST  2 VIEW  COMPARISON:  None.  FINDINGS: Cardiomediastinal silhouette is unremarkable. There is dextroscoliosis of thoracic spine. Osteopenia and mild degenerative changes thoracic spine. No acute infiltrate or pulmonary edema.  IMPRESSION: No active disease.  Dextroscoliosis thoracic spine.   Electronically Signed    By: Lahoma Crocker M.D.   On: 05/31/2013 14:58   Final result pending, leads in stable position.  PHYSICAL EXAM Left chest without hematoma/ecchymosis  DEVICE INTERROGATION: Device interrogation demonstrates normal function. Device reprogrammed for appropriate long term parameters.  A/P 1. Syncope 2. Non-ischemic CM, EF 45% by contrast echo 3. LBBB 4. S/p BiV PPM Rec: ok for discharge. Usual followup.  Mikle Bosworth.D.

## 2013-06-03 NOTE — Op Note (Signed)
NAMEMarland Kitchen  MIRIELLE, BYRUM NO.:  [id number]  MEDICAL RECORD NO.:  [id number]  LOCATION:  3W29C                        FACILITY:  [facility]  PHYSICIAN:  Champ Mungo. Lovena Le, MD    DATE OF BIRTH:  May 31, 1955  DATE OF PROCEDURE:  06/02/2013 DATE OF DISCHARGE:                              OPERATIVE REPORT   PROCEDURE PERFORMED:  Insertion of a biventricular pacemaker.  INDICATION:  Nonischemic cardiomyopathy, ejection fraction 45%, left bundle-branch block, and unexplained syncope.  INTRODUCTION:  The patient is a 58 year old woman who has a history of syncopal episodes in the past.  She had her medications adjusted including decreased in her metoprolol.  She presented to the hospital after having recurrent syncope.  No obvious etiology is known, but she does have a left bundle-branch block with a QRS duration of over 150 milliseconds.  The patient underwent repeat 2D echo, demonstrated an ejection fraction of 45% using echo contrast.  She does have class II congestive heart failure.  She is referred now for insertion of a biventricular pacemaker.  It is expected that with progression of her conduction system disease, leading to syncope, that she will ultimately pace a 100% of the time in her ventricle.  The patient has been on metoprolol, ACE inhibitors, diuretics, and spironolactone.  DESCRIPTION OF PROCEDURE:  After informed consent was obtained, the patient was taken to the diagnostic EP lab in a fasting state.  After usual preparation and draping, intravenous fentanyl and midazolam were given for sedation.  A 30 mL of lidocaine was infiltrated into the left infraclavicular region.  A 5-cm incision was carried out over this region and electrocautery was utilized to dissect down to the fascial plane.  The left subclavian vein was punctured x2, and the Medtronic model 5076, 52 cm active fixation pacing lead, serial #[id number] was advanced into the right ventricle and the  Medtronic model 5076, 45 cm active fixation pacing lead, serial #[id number] was advanced to the right atrium.  Mapping was first carried out in the right ventricle at the final site on the RV septum, the R-waves measured 12 mV, the pacing impedance was 800 ohms, and the threshold was 0.9 V at 0.5 milliseconds. A large current of injury was present and 10 V pacing did not stimulate the diaphragm.  With the ventricular lead in satisfactory position, attention was then turned to the placement of the atrial lead which was placed in the anterolateral portion of the right atrium where the P- waves measured 6 mV.  The pacing impedance with the lead actively fixed was 690 ohms and a threshold was a V at 0.5 milliseconds.  A 10 V pacing again did not stimulate the diaphragm and there was a large injury current with active fixation of the lead.  With these satisfactory parameters, attention was then turned to placement of the left ventricular pacing lead.  The coronary sinus guiding catheter along with a 6-French hexapolar EP catheter were utilized to cannulate the coronary sinus.  This was carried out without particular difficulty.  The venography was then carried out of the coronary sinus demonstrated a satisfactory posterolateral vein.  Angioplasty guidewire was advanced into the posterolateral vein and  the Medtronic model 4194, 88 cm pacing lead, serial #[id number] was advanced over the guidewire and into the posterior vein of the left ventricle.  The lead was placed at a distance 1/2 from base to apex, and appeared as satisfactory and stable pacing parameters with a left ventricular threshold less than a V at 0.5 milliseconds, and with 10 V pacing not stimulating the diaphragm.  With all of the above, the lead was liberated from the guiding catheter in the usual manner.  Figure-of-eight silk suture was utilized to secure the leads.  Sewing sleeves were secured with silk suture. Electrocautery  was utilized to make subcutaneous pocket.  Antibiotic irrigation was utilized to irrigate the pocket and electrocautery was utilized to assure hemostasis.  The Medtronic biventricular pacemaker, serial [id number] was connected to the atrial RV and LV leads and placed back in the subcutaneous pocket where it was secured with silk suture.  The pocket was irrigated with additional antibiotic irrigation and the incision was closed with 2-0 and 3-0 Vicryl suture.  Benzoin and Steri-Strips were painted on the skin, pressure dressing was applied, and the patient was returned to her room in satisfactory condition.  COMPLICATIONS:  There were no immediate procedure complications.  RESULTS:  Demonstrate successful implantation of a Medtronic biventricular pacemaker without immediate procedure complication.     Champ Mungo. Lovena Le, MD     GWT/MEDQ  D:  06/02/2013  T:  06/03/2013  Job:  [id number]  cc:   Serafina Royals, MD

## 2013-06-06 ENCOUNTER — Other Ambulatory Visit: Payer: Self-pay | Admitting: Internal Medicine

## 2013-06-07 ENCOUNTER — Encounter (HOSPITAL_COMMUNITY): Payer: Self-pay | Admitting: *Deleted

## 2013-06-09 LAB — HM DIABETES EYE EXAM

## 2013-06-15 ENCOUNTER — Ambulatory Visit (INDEPENDENT_AMBULATORY_CARE_PROVIDER_SITE_OTHER): Payer: BC Managed Care – PPO | Admitting: *Deleted

## 2013-06-15 DIAGNOSIS — I4581 Long QT syndrome: Secondary | ICD-10-CM

## 2013-06-15 DIAGNOSIS — R55 Syncope and collapse: Secondary | ICD-10-CM

## 2013-06-15 LAB — MDC_IDC_ENUM_SESS_TYPE_INCLINIC
Brady Statistic AP VS Percent: 0.01 %
Brady Statistic AS VP Percent: 99.9 %
Brady Statistic RA Percent Paced: 0.05 %
Brady Statistic RV Percent Paced: 99.95 %
Date Time Interrogation Session: 20150225140940
Lead Channel Impedance Value: 228 Ohm
Lead Channel Impedance Value: 399 Ohm
Lead Channel Impedance Value: 494 Ohm
Lead Channel Pacing Threshold Amplitude: 0.625 V
Lead Channel Pacing Threshold Amplitude: 1.75 V
Lead Channel Pacing Threshold Pulse Width: 0.4 ms
Lead Channel Sensing Intrinsic Amplitude: 2.875 mV
Lead Channel Setting Pacing Amplitude: 3.5 V
Lead Channel Setting Pacing Amplitude: 4 V
Lead Channel Setting Pacing Pulse Width: 0.4 ms
Lead Channel Setting Sensing Sensitivity: 0.9 mV
MDC IDC MSMT BATTERY VOLTAGE: 3.07 V
MDC IDC MSMT LEADCHNL LV IMPEDANCE VALUE: 323 Ohm
MDC IDC MSMT LEADCHNL LV IMPEDANCE VALUE: 399 Ohm
MDC IDC MSMT LEADCHNL LV IMPEDANCE VALUE: 475 Ohm
MDC IDC MSMT LEADCHNL LV PACING THRESHOLD PULSEWIDTH: 0.4 ms
MDC IDC MSMT LEADCHNL RA IMPEDANCE VALUE: 437 Ohm
MDC IDC MSMT LEADCHNL RA PACING THRESHOLD AMPLITUDE: 0.5 V
MDC IDC MSMT LEADCHNL RA PACING THRESHOLD PULSEWIDTH: 0.4 ms
MDC IDC MSMT LEADCHNL RV IMPEDANCE VALUE: 456 Ohm
MDC IDC MSMT LEADCHNL RV IMPEDANCE VALUE: 513 Ohm
MDC IDC MSMT LEADCHNL RV SENSING INTR AMPL: 18.875 mV
MDC IDC MSMT LEADCHNL RV SENSING INTR AMPL: 21.75 mV
MDC IDC SET LEADCHNL LV PACING PULSEWIDTH: 0.4 ms
MDC IDC SET LEADCHNL RA PACING AMPLITUDE: 3.5 V
MDC IDC SET ZONE DETECTION INTERVAL: 400 ms
MDC IDC STAT BRADY AP VP PERCENT: 0.04 %
MDC IDC STAT BRADY AS VS PERCENT: 0.04 %
Zone Setting Detection Interval: 350 ms

## 2013-06-15 NOTE — Progress Notes (Signed)

## 2013-06-21 ENCOUNTER — Encounter: Payer: Self-pay | Admitting: Internal Medicine

## 2013-06-21 NOTE — Telephone Encounter (Signed)
See mychart message, requesting 90 day supply, ok to send #[phone]

## 2013-06-23 MED ORDER — METHOCARBAMOL 500 MG PO TABS: 500.0000 mg | ORAL_TABLET | Freq: Three times a day (TID) | ORAL | Status: DC | PRN

## 2013-06-27 ENCOUNTER — Encounter: Payer: Self-pay | Admitting: Internal Medicine

## 2013-07-06 ENCOUNTER — Telehealth: Payer: Self-pay | Admitting: Internal Medicine

## 2013-07-06 NOTE — Telephone Encounter (Signed)
New Message:  Pt is asking if/when she will be able to return to swimming.. Pt states she use to swim 2 to 3 times a week at [facility]. Pt needs something in writing stating she is cleared for that activity. Pt would like a call back form the nurse.

## 2013-07-06 NOTE — Telephone Encounter (Signed)
Discussed with Dr Lovena Le.  It has been 4 weeks since implant and she may return to swimming with no restrictions

## 2013-08-16 ENCOUNTER — Telehealth: Payer: Self-pay | Admitting: *Deleted

## 2013-08-16 DIAGNOSIS — E119 Type 2 diabetes mellitus without complications: Secondary | ICD-10-CM

## 2013-08-16 DIAGNOSIS — E785 Hyperlipidemia, unspecified: Secondary | ICD-10-CM

## 2013-08-16 NOTE — Telephone Encounter (Signed)
Pt is coming in tomorrow what labs and dx?  

## 2013-08-17 ENCOUNTER — Other Ambulatory Visit (INDEPENDENT_AMBULATORY_CARE_PROVIDER_SITE_OTHER): Payer: BC Managed Care – PPO

## 2013-08-17 DIAGNOSIS — E119 Type 2 diabetes mellitus without complications: Secondary | ICD-10-CM

## 2013-08-17 DIAGNOSIS — E785 Hyperlipidemia, unspecified: Secondary | ICD-10-CM

## 2013-08-17 LAB — COMPREHENSIVE METABOLIC PANEL
ALT: 22 U/L (ref 0–35)
AST: 25 U/L (ref 0–37)
Albumin: 4 g/dL (ref 3.5–5.2)
Alkaline Phosphatase: 70 U/L (ref 39–117)
BUN: 15 mg/dL (ref 6–23)
CALCIUM: 9.3 mg/dL (ref 8.4–10.5)
CHLORIDE: 104 meq/L (ref 96–112)
CO2: 29 mEq/L (ref 19–32)
Creatinine, Ser: 0.7 mg/dL (ref 0.4–1.2)
GFR: 99.14 mL/min (ref >60.00)
GLUCOSE: 112 mg/dL — AB (ref 70–99)
Potassium: 4 mEq/L (ref 3.5–5.1)
Sodium: 140 mEq/L (ref 135–145)
TOTAL PROTEIN: 7 g/dL (ref 6.0–8.3)
Total Bilirubin: 0.3 mg/dL (ref 0.3–1.2)

## 2013-08-17 LAB — MICROALBUMIN / CREATININE URINE RATIO
Creatinine,U: 99.2 mg/dL
MICROALB UR: 0.4 mg/dL (ref 0.0–1.9)
Microalb Creat Ratio: 0.4 mg/g (ref 0.0–30.0)

## 2013-08-17 LAB — HEMOGLOBIN A1C: HEMOGLOBIN A1C: 6.1 % (ref 4.6–6.5)

## 2013-08-17 LAB — LIPID PANEL
CHOL/HDL RATIO: 6
Cholesterol: 205 mg/dL — ABNORMAL HIGH (ref 0–200)
HDL: 36.8 mg/dL — AB (ref >39.00)
LDL Cholesterol: 88 mg/dL (ref 0–99)
TRIGLYCERIDES: 402 mg/dL — AB (ref 0.0–149.0)
VLDL: 80.4 mg/dL — ABNORMAL HIGH (ref 0.0–40.0)

## 2013-08-18 ENCOUNTER — Encounter: Payer: Self-pay | Admitting: Internal Medicine

## 2013-08-19 ENCOUNTER — Ambulatory Visit (INDEPENDENT_AMBULATORY_CARE_PROVIDER_SITE_OTHER): Payer: BC Managed Care – PPO | Admitting: Internal Medicine

## 2013-08-19 ENCOUNTER — Encounter: Payer: Self-pay | Admitting: Internal Medicine

## 2013-08-19 VITALS — BP 134/76 | HR 75 | Temp 97.5°F | Resp 16 | Wt 179.2 lb

## 2013-08-19 DIAGNOSIS — E785 Hyperlipidemia, unspecified: Secondary | ICD-10-CM

## 2013-08-19 DIAGNOSIS — E119 Type 2 diabetes mellitus without complications: Secondary | ICD-10-CM | POA: Diagnosis not present

## 2013-08-19 DIAGNOSIS — M4802 Spinal stenosis, cervical region: Secondary | ICD-10-CM | POA: Diagnosis not present

## 2013-08-19 DIAGNOSIS — I4581 Long QT syndrome: Secondary | ICD-10-CM

## 2013-08-19 DIAGNOSIS — I10 Essential (primary) hypertension: Secondary | ICD-10-CM

## 2013-08-19 DIAGNOSIS — A6920 Lyme disease, unspecified: Secondary | ICD-10-CM

## 2013-08-19 DIAGNOSIS — Z888 Allergy status to other drugs, medicaments and biological substances status: Secondary | ICD-10-CM | POA: Diagnosis not present

## 2013-08-19 DIAGNOSIS — Z789 Other specified health status: Secondary | ICD-10-CM

## 2013-08-19 MED ORDER — COENZYME Q10 10 MG PO CAPS: 10.0000 mg | ORAL_CAPSULE | Freq: Every day | ORAL | Status: DC

## 2013-08-19 MED ORDER — GEMFIBROZIL 600 MG PO TABS: 600.0000 mg | ORAL_TABLET | Freq: Two times a day (BID) | ORAL | Status: DC

## 2013-08-19 NOTE — Assessment & Plan Note (Signed)
Trial of gemfibrozol for trigs > 400.  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

## 2013-08-19 NOTE — Assessment & Plan Note (Signed)
Well-controlled on current medications.  hemoglobin A1c has been consistently less than 7.0 . She had her annual eye exam in January  ; her foot exam is normal.   Lab Results  Component Value Date   HGBA1C 6.1 08/17/2013   Lab Results  Component Value Date   MICROALBUR 0.4 08/17/2013

## 2013-08-19 NOTE — Progress Notes (Addendum)
Patient ID: Julia Pierce, female   DOB: [birth year], 58 y.o.   MRN: [id number]   Patient Active Problem List   Diagnosis Date Noted  . Syncope and collapse 05/31/2013  . Syncope 05/31/2013  . Cervical stenosis of spine 05/11/2013  . Erythema migrans (Lyme disease) 12/06/2012  . Tick bite of hip 09/13/2012  . Statin intolerance 03/03/2012  . Reflux esophagitis 12/31/2011  . Diabetes mellitus type 2, diet-controlled 09/27/2011  . Obesity (BMI 30-39.9) 05/12/2011  . Morton's neuroma   . Valvular cardiomyopathy 03/31/2011  . Hyperlipidemia LDL goal < 100 03/31/2011  . Long QT syndrome 02/05/2011  . Hypertension   . Depression   . Holter monitor, abnormal     Subjective:  CC:   Chief Complaint  Patient presents with  . Follow-up  . Diabetes  . Hypertension  . Hyperlipidemia    HPI:   Julia Pierce is a 58 y.o. female who presents for 3 month follow up on DM, hypertension and hyperlipidemia.  In the interim since her last visit she underwent placement of a Medtronic biventricular pacer for recent unexplained syncope in the setting of  A left BBB with long QT interval.  TTE was done prior to procedure, with EF 45% .  The syncopal episode occurred while walking to her car after doing her usual swim on Feb 10th. She tolerated the procedure well, has follow up with Crissie Sickles on May 26th.  Has not been cleared for exercise,  Driving or mammogram  yet.   Still having an intermittent tickle that makes her cough,  Not positional or post prandial , only occurs every other day.    DM:  Labs reviewed,  a1c 6.1    Lipids are notable for triglycerides 400 without treatment.  Sh ehas a history of ntolerance to statins and fenofibrates  But has had no ptiot trial  Of gemfibrozil.  Neck pain .  She noticed an imrovement in her chronic neck pain since she began swimming regularly for exercise. Has not required epidural injections    Past Medical History  Diagnosis Date  . Arthritis   .  Chicken pox   . Headache, frequent episodic tension-type   . Cardiac arrhythmia due to congenital heart disease   . History of high blood pressure     readings  . High cholesterol   . Migraines   . Hx of colonoscopy sept 2012    normal,  next due 2022, [name]  . Hypertension   . Depression   . Holter monitor, abnormal August 2012    done for long QT.  some contractile asynchrony [name])  . Morton's neuroma     bilateral  . Syncope     Past Surgical History  Procedure Laterality Date  . Appendectomy  1973  . Tonsillectomy and adenoidectomy  1966  . Spinal fusion  03/2007    ruptured disc  L5  [name]  . Hysteroscopy  2005    heavy bleeding  . Abdominal hysterectomy  Dec 2013    [facility]  . Bi-ventricular pacemaker insertion (crt-p)  05/2013    MDT CRTP implanted by Dr Lovena Le for cardiomyopathy, LBBB, and syncope       The following portions of the patient's history were reviewed and updated as appropriate: Allergies, current medications, and problem list.    Review of Systems:   Patient denies headache, fevers, malaise, unintentional weight loss, skin rash, eye pain, sinus congestion and sinus pain, sore throat, dysphagia,  hemoptysis ,  cough, dyspnea, wheezing, chest pain, palpitations, orthopnea, edema, abdominal pain, nausea, melena, diarrhea, constipation, flank pain, dysuria, hematuria, urinary  Frequency, nocturia, numbness, tingling, seizures,  Focal weakness, Loss of consciousness,  Tremor, insomnia, depression, anxiety, and suicidal ideation.     History   Social History  . Marital Status: Married    Spouse Name: N/A    Number of Children: N/A  . Years of Education: N/A   Occupational History  . Not on file.   Social History Main Topics  . Smoking status: Never Smoker   . Smokeless tobacco: Never Used  . Alcohol Use: No  . Drug Use: No  . Sexual Activity: Not on file   Other Topics Concern  . Not on file   Social History Narrative  . No  narrative on file    Objective:  Filed Vitals:   08/19/13 0917  BP: 134/76  Pulse: 75  Temp: 97.5 F (36.4 C)  Resp: 16     General appearance: alert, cooperative and appears stated age Ears: normal TM's and external ear canals both ears Throat: lips, mucosa, and tongue normal; teeth and gums normal Neck: no adenopathy, no carotid bruit, supple, symmetrical, trachea midline and thyroid not enlarged, symmetric, no tenderness/mass/nodules Back: symmetric, no curvature. ROM normal. No CVA tenderness. Lungs: clear to auscultation bilaterally Heart: regular rate and rhythm, S1, S2 normal, no murmur, click, rub or gallop Abdomen: soft, non-tender; bowel sounds normal; no masses,  no organomegaly Pulses: 2+ and symmetric Skin: Skin color, texture, turgor normal. No rashes or lesions Lymph nodes: Cervical, supraclavicular, and axillary nodes normal.  Assessment and Plan:  Cervical stenosis of spine Managed with vicodin . Pain has improved; she has required no epidurals since she started swimming regularly for exercise.   Statin intolerance Trial of gemfibrozol for trigs > 400.  fdd not tolerate tricor   Diabetes mellitus type 2, diet-controlled Well-controlled on current medications.  hemoglobin A1c has been consistently less than 7.0 . She had her annual eye exam in January  ; her foot exam is normal.   Lab Results  Component Value Date   HGBA1C 6.1 08/17/2013   Lab Results  Component Value Date   MICROALBUR 0.4 08/17/2013       Erythema migrans (Lyme disease) She reports having seen a red ring around the bite and requested testing for Lyme Disease.  serologiesw were negative for all commonly feared tick borne illnesses.     Hypertension Well controlled on current regimen. Renal function stable, no changes today.  \ Lab Results  Component Value Date   CREATININE 0.7 08/17/2013   Lab Results  Component Value Date   NA 140 08/17/2013   K 4.0 08/17/2013   CL 104  08/17/2013   CO2 29 08/17/2013     Long QT syndrome B/p biventricular pacer for syncopal episode in the setting of Left BBB with long QT  Hyperlipidemia LDL goal < 100 Persistently Elevated triglcyerides discussed.  Discussed  Starting a trial of  gemfibrozil    A total of 40 minutes was spent with patient more than half of which was spent in counseling, reviewing records from other prviders and coordination of care.   Updated Medication List Outpatient Encounter Prescriptions as of 08/19/2013  Medication Sig  . aspirin EC 81 MG tablet Take 81 mg by mouth daily.  . Azelaic Acid (FINACEA EX) Apply 1 application topically 2 (two) times daily.  . B Complex Vitamins (B COMPLEX PO) Take 1 tablet by mouth  daily.   . calcium carbonate (OS-CAL) 600 MG TABS Take 600 mg by mouth daily.    . Cholecalciferol (VITAMIN D3) 2000 UNITS TABS Take 1 tablet by mouth daily.  . diphenhydrAMINE (BENADRYL) 25 MG tablet Take 25 mg by mouth at bedtime as needed for sleep.   Marland Kitchen esomeprazole (NEXIUM) 20 MG capsule Take 40 mg by mouth daily at 12 noon.  Marland Kitchen HYDROcodone-acetaminophen (NORCO) 5-325 MG per tablet Take 1 tablet by mouth every 6 (six) hours as needed for moderate pain.   Marland Kitchen losartan-hydrochlorothiazide (HYZAAR) 50-12.5 MG per tablet Take 1 tablet by mouth daily.  . methocarbamol (ROBAXIN) 500 MG tablet Take 1 tablet (500 mg total) by mouth every 8 (eight) hours as needed for muscle spasms.  . metoprolol tartrate (LOPRESSOR) 25 MG tablet Take 1 tablet (25 mg total) by mouth 2 (two) times daily.  . Multiple Vitamin (MULTIVITAMIN) tablet Take 1 tablet by mouth daily.    . sertraline (ZOLOFT) 100 MG tablet TAKE 1 TABLET BY MOUTH DAILY  . spironolactone (ALDACTONE) 25 MG tablet Take 25 mg by mouth daily.  Marland Kitchen tolterodine (DETROL) 2 MG tablet Take 2 mg by mouth 2 (two) times daily.  . [DISCONTINUED] hydrochlorothiazide (HYDRODIURIL) 12.5 MG tablet Take 12.5 mg by mouth daily.  . [DISCONTINUED] losartan (COZAAR) 50  MG tablet Take 1 tablet (50 mg total) by mouth daily.  . Coenzyme Q10 10 MG capsule Take 1 capsule (10 mg total) by mouth daily.  Marland Kitchen gemfibrozil (LOPID) 600 MG tablet Take 1 tablet (600 mg total) by mouth 2 (two) times daily before a meal.     No orders of the defined types were placed in this encounter.    No Follow-up on file.

## 2013-08-19 NOTE — Assessment & Plan Note (Addendum)
Managed with vicodin . Pain has improved; she has required no epidurals since she started swimming regularly for exercise.

## 2013-08-19 NOTE — Patient Instructions (Addendum)
You are doing well  Your diabetes is well controlled   Trial of gemfibrozil for high triglycerides . The most common reported side effect is indigestion,  So take it twice daily after breakfast and dinner.  The co q 10 may be taken with it

## 2013-08-21 NOTE — Assessment & Plan Note (Signed)
Persistently Elevated triglcyerides discussed.  Discussed  Starting a trial of  gemfibrozil

## 2013-08-21 NOTE — Assessment & Plan Note (Signed)
She reports having seen a red ring around the bite and requested testing for Lyme Disease.  serologiesw were negative for all commonly feared tick borne illnesses.

## 2013-08-21 NOTE — Assessment & Plan Note (Signed)
B/p biventricular pacer for syncopal episode in the setting of Left BBB with long QT

## 2013-08-21 NOTE — Assessment & Plan Note (Addendum)
Well controlled on current regimen. Renal function stable, no changes today.  \ Lab Results  Component Value Date   CREATININE 0.7 08/17/2013   Lab Results  Component Value Date   NA 140 08/17/2013   K 4.0 08/17/2013   CL 104 08/17/2013   CO2 29 08/17/2013

## 2013-09-03 ENCOUNTER — Other Ambulatory Visit: Payer: Self-pay | Admitting: Internal Medicine

## 2013-09-13 ENCOUNTER — Ambulatory Visit (INDEPENDENT_AMBULATORY_CARE_PROVIDER_SITE_OTHER): Payer: BC Managed Care – PPO | Admitting: Internal Medicine

## 2013-09-13 ENCOUNTER — Encounter: Payer: Self-pay | Admitting: Internal Medicine

## 2013-09-13 VITALS — BP 128/88 | HR 77 | Ht 62.0 in | Wt 182.0 lb

## 2013-09-13 DIAGNOSIS — I447 Left bundle-branch block, unspecified: Secondary | ICD-10-CM

## 2013-09-13 DIAGNOSIS — I5022 Chronic systolic (congestive) heart failure: Secondary | ICD-10-CM | POA: Diagnosis not present

## 2013-09-13 DIAGNOSIS — R55 Syncope and collapse: Secondary | ICD-10-CM | POA: Diagnosis not present

## 2013-09-13 DIAGNOSIS — I428 Other cardiomyopathies: Secondary | ICD-10-CM

## 2013-09-13 HISTORY — DX: Chronic systolic (congestive) heart failure: I50.22

## 2013-09-13 LAB — MDC_IDC_ENUM_SESS_TYPE_INCLINIC
Battery Voltage: 3.02 V
Brady Statistic AP VP Percent: 0.1 %
Brady Statistic AS VP Percent: 99.9 %
Brady Statistic AS VS Percent: 0.1 %
Lead Channel Impedance Value: 437 Ohm
Lead Channel Impedance Value: 551 Ohm
Lead Channel Pacing Threshold Amplitude: 0.625 V
Lead Channel Pacing Threshold Pulse Width: 0.5 ms
Lead Channel Sensing Intrinsic Amplitude: 19.4 mV
Lead Channel Sensing Intrinsic Amplitude: 2.6 mV
Lead Channel Setting Pacing Amplitude: 3.5 V
Lead Channel Setting Pacing Amplitude: 4 V
Lead Channel Setting Pacing Pulse Width: 0.4 ms
MDC IDC MSMT LEADCHNL LV IMPEDANCE VALUE: 513 Ohm
MDC IDC MSMT LEADCHNL LV PACING THRESHOLD AMPLITUDE: 1.5 V
MDC IDC MSMT LEADCHNL LV PACING THRESHOLD PULSEWIDTH: 0.4 ms
MDC IDC MSMT LEADCHNL RV PACING THRESHOLD AMPLITUDE: 0.5 V
MDC IDC MSMT LEADCHNL RV PACING THRESHOLD PULSEWIDTH: 0.4 ms
MDC IDC SET LEADCHNL LV PACING PULSEWIDTH: 0.4 ms
MDC IDC SET LEADCHNL RV PACING AMPLITUDE: 3.5 V
MDC IDC SET LEADCHNL RV SENSING SENSITIVITY: 0.9 mV
MDC IDC SET ZONE DETECTION INTERVAL: 350 ms
MDC IDC SET ZONE DETECTION INTERVAL: 400 ms
MDC IDC STAT BRADY AP VS PERCENT: 0.1 %

## 2013-09-13 NOTE — Progress Notes (Signed)
HPI [name] returns today for followup. She is a very pleasant 58 yo woman with chronic LBBB, chronic systolic heart failure, and syncope, s/p insertion of a BiV PPM. In the interim she has done well. No recurrent syncope. She notices her device when she sleeps on her left side. No chest pain or sob or peripheral edema. Allergies  Allergen Reactions  . Cephalexin Anaphylaxis  . Atorvastatin     myalgias  . Penicillins   . Latex Rash     Current Outpatient Prescriptions  Medication Sig Dispense Refill  . aspirin EC 81 MG tablet Take 81 mg by mouth daily.      . Azelaic Acid (FINACEA EX) Apply 1 application topically 2 (two) times daily.      . B Complex Vitamins (B COMPLEX PO) Take 1 tablet by mouth daily.       . calcium carbonate (OS-CAL) 600 MG TABS Take 600 mg by mouth daily.        . Cholecalciferol (VITAMIN D3) 2000 UNITS TABS Take 1 tablet by mouth daily.      . diphenhydrAMINE (BENADRYL) 25 MG tablet Take 25 mg by mouth at bedtime as needed for sleep.       Marland Kitchen esomeprazole (NEXIUM) 20 MG capsule Take 40 mg by mouth daily at 12 noon.      . fluticasone (FLONASE) 50 MCG/ACT nasal spray Place 2 sprays into both nostrils daily.      Marland Kitchen HYDROcodone-acetaminophen (NORCO) 5-325 MG per tablet Take 1 tablet by mouth every 6 (six) hours as needed for moderate pain.       Marland Kitchen losartan-hydrochlorothiazide (HYZAAR) 50-12.5 MG per tablet Take 1 tablet by mouth daily.      . methocarbamol (ROBAXIN) 500 MG tablet Take 1 tablet (500 mg total) by mouth every 8 (eight) hours as needed for muscle spasms.  270 tablet  3  . metoprolol tartrate (LOPRESSOR) 25 MG tablet Take 1 tablet (25 mg total) by mouth 2 (two) times daily.  180 tablet  3  . Multiple Vitamin (MULTIVITAMIN) tablet Take 1 tablet by mouth daily.        . sertraline (ZOLOFT) 100 MG tablet TAKE 1 TABLET BY MOUTH DAILY  90 tablet  0  . spironolactone (ALDACTONE) 25 MG tablet Take 25 mg by mouth daily.      Marland Kitchen tolterodine (DETROL) 2  MG tablet Take 2 mg by mouth 2 (two) times daily.       No current facility-administered medications for this visit.     Past Medical History  Diagnosis Date  . Arthritis   . Chicken pox   . Headache, frequent episodic tension-type   . Cardiac arrhythmia due to congenital heart disease   . History of high blood pressure     readings  . High cholesterol   . Migraines   . Hx of colonoscopy sept 2012    normal,  next due 2022, [name]  . Hypertension   . Depression   . Holter monitor, abnormal August 2012    done for long QT.  some contractile asynchrony [name])  . Morton's neuroma     bilateral  . Syncope     ROS:   All systems reviewed and negative except as noted in the HPI.   Past Surgical History  Procedure Laterality Date  . Appendectomy  1973  . Tonsillectomy and adenoidectomy  1966  . Spinal fusion  03/2007    ruptured disc  L5  Max Patrice [place]  . Hysteroscopy  2005    heavy bleeding  . Abdominal hysterectomy  Dec 2013    [facility]  . Bi-ventricular pacemaker insertion (crt-p)  05/2013    MDT CRTP implanted by Dr Lovena Le for cardiomyopathy, LBBB, and syncope     Family History  Problem Relation Age of Onset  . Arthritis Mother   . Hyperlipidemia Mother   . Arthritis Father   . Cancer Father     prostate cancer   . Hyperlipidemia Father   . Cancer Other     ovarian,uterus  . Heart disease Other   . Stroke Other   . Learning disabilities Other   . Cirrhosis Mother   . Coronary artery disease Paternal Grandfather 4  . Pulmonary embolism Father 24     History   Social History  . Marital Status: Married    Spouse Name: N/A    Number of Children: N/A  . Years of Education: N/A   Occupational History  . Not on file.   Social History Main Topics  . Smoking status: Never Smoker   . Smokeless tobacco: Never Used  . Alcohol Use: No  . Drug Use: No  . Sexual Activity: Not on file   Other Topics Concern  . Not on file   Social History Narrative    . No narrative on file     BP 128/88  Pulse 77  Ht 5\' 2"  (1.575 m)  Wt 182 lb (82.555 kg)  BMI 33.28 kg/m2  Physical Exam:  Well appearing midde aged woman, NAD HEENT: Unremarkable Neck:  No JVD, no thyromegally Back:  No CVA tenderness Lungs:  Clear with no wheezes, well healed PPM incision. HEART:  Regular rate rhythm, no murmurs, no rubs, no clicks Abd:  soft, positive bowel sounds, no organomegally, no rebound, no guarding Ext:  2 plus pulses, no edema, no cyanosis, no clubbing Skin:  No rashes no nodules Neuro:  CN II through XII intact, motor grossly intact   DEVICE  Normal device function.  See PaceArt for details.   Assess/Plan:

## 2013-09-13 NOTE — Patient Instructions (Signed)
Remote monitoring is used to monitor your Pacemaker of ICD from home. This monitoring reduces the number of office visits required to check your device to one time per year. It allows [place] to keep an eye on the functioning of your device to ensure it is working properly. You are scheduled for a device check from home on December 13, 2013. You may send your transmission at any time that day. If you have a wireless device, the transmission will be sent automatically. After your physician reviews your transmission, you will receive a postcard with your next transmission date.  Your physician wants you to follow-up in: 6 months with Ileene Hutchinson, PA and 12 months with Dr Lovena Le.  You will receive a reminder letter in the mail two months in advance. If you don't receive a letter, please call our office to schedule the follow-up appointment.

## 2013-09-13 NOTE — Assessment & Plan Note (Signed)
She has had no recurrent arrhythmias since her device was placed. It appears that LBBB and [place] attacks was the cause. She will be allowed to resume driving.

## 2013-09-13 NOTE — Assessment & Plan Note (Signed)
Her EF was 45% prior to her procedure. I suspect it is improved. As she is doing well clinically, will not change meds or repeat her echo. I will see her back in 9 months. She will call us if her symptoms change. Will consider repeat echo after her clinic followup.

## 2013-09-15 ENCOUNTER — Encounter: Payer: BC Managed Care – PPO | Admitting: Internal Medicine

## 2013-09-30 ENCOUNTER — Telehealth: Payer: Self-pay | Admitting: Internal Medicine

## 2013-09-30 NOTE — Telephone Encounter (Signed)
New message     Pt received a new monitor yesterday and she already has a wireless monitor she got from the hosp.  The new monitor requires it to be plugged into a phone jack.

## 2013-09-30 NOTE — Telephone Encounter (Signed)
Pt does not have phone jack in bedroom. Prefers wireless Carelink which she received at implant. I ordered a return kit for the additional monitor.

## 2013-10-12 DIAGNOSIS — L719 Rosacea, unspecified: Secondary | ICD-10-CM | POA: Diagnosis not present

## 2013-10-12 DIAGNOSIS — I781 Nevus, non-neoplastic: Secondary | ICD-10-CM | POA: Diagnosis not present

## 2013-10-12 DIAGNOSIS — Z872 Personal history of diseases of the skin and subcutaneous tissue: Secondary | ICD-10-CM | POA: Diagnosis not present

## 2013-10-12 DIAGNOSIS — Z1283 Encounter for screening for malignant neoplasm of skin: Secondary | ICD-10-CM | POA: Diagnosis not present

## 2013-11-01 ENCOUNTER — Ambulatory Visit: Payer: Self-pay | Admitting: Pain Medicine

## 2013-11-01 DIAGNOSIS — Z950 Presence of cardiac pacemaker: Secondary | ICD-10-CM | POA: Diagnosis not present

## 2013-11-01 DIAGNOSIS — M5137 Other intervertebral disc degeneration, lumbosacral region: Secondary | ICD-10-CM | POA: Diagnosis not present

## 2013-11-01 DIAGNOSIS — Z981 Arthrodesis status: Secondary | ICD-10-CM | POA: Diagnosis not present

## 2013-11-01 DIAGNOSIS — M7710 Lateral epicondylitis, unspecified elbow: Secondary | ICD-10-CM | POA: Diagnosis not present

## 2013-11-01 DIAGNOSIS — M5030 Other cervical disc degeneration, unspecified cervical region: Secondary | ICD-10-CM | POA: Diagnosis not present

## 2013-11-09 ENCOUNTER — Ambulatory Visit: Payer: Self-pay | Admitting: Pain Medicine

## 2013-11-09 DIAGNOSIS — M7710 Lateral epicondylitis, unspecified elbow: Secondary | ICD-10-CM | POA: Diagnosis not present

## 2013-11-09 DIAGNOSIS — M5030 Other cervical disc degeneration, unspecified cervical region: Secondary | ICD-10-CM | POA: Diagnosis not present

## 2013-11-14 DIAGNOSIS — I10 Essential (primary) hypertension: Secondary | ICD-10-CM | POA: Diagnosis not present

## 2013-11-14 DIAGNOSIS — E781 Pure hyperglyceridemia: Secondary | ICD-10-CM | POA: Diagnosis not present

## 2013-11-14 DIAGNOSIS — R55 Syncope and collapse: Secondary | ICD-10-CM | POA: Diagnosis not present

## 2013-11-25 ENCOUNTER — Other Ambulatory Visit: Payer: Self-pay | Admitting: Internal Medicine

## 2013-12-05 ENCOUNTER — Other Ambulatory Visit (INDEPENDENT_AMBULATORY_CARE_PROVIDER_SITE_OTHER): Payer: BC Managed Care – PPO

## 2013-12-05 ENCOUNTER — Telehealth: Payer: Self-pay | Admitting: *Deleted

## 2013-12-05 DIAGNOSIS — E119 Type 2 diabetes mellitus without complications: Secondary | ICD-10-CM

## 2013-12-05 LAB — LIPID PANEL
CHOLESTEROL: 241 mg/dL — AB (ref 0–200)
HDL: 39.3 mg/dL (ref >39.00)
NONHDL: 201.7
Total CHOL/HDL Ratio: 6
Triglycerides: 287 mg/dL — ABNORMAL HIGH (ref 0.0–149.0)
VLDL: 57.4 mg/dL — AB (ref 0.0–40.0)

## 2013-12-05 LAB — COMPREHENSIVE METABOLIC PANEL
ALT: 25 U/L (ref 0–35)
AST: 26 U/L (ref 0–37)
Albumin: 3.9 g/dL (ref 3.5–5.2)
Alkaline Phosphatase: 63 U/L (ref 39–117)
BUN: 20 mg/dL (ref 6–23)
CALCIUM: 9.1 mg/dL (ref 8.4–10.5)
CHLORIDE: 104 meq/L (ref 96–112)
CO2: 24 meq/L (ref 19–32)
Creatinine, Ser: 0.8 mg/dL (ref 0.4–1.2)
GFR: 77.93 mL/min (ref >60.00)
Glucose, Bld: 112 mg/dL — ABNORMAL HIGH (ref 70–99)
Potassium: 4 mEq/L (ref 3.5–5.1)
SODIUM: 142 meq/L (ref 135–145)
TOTAL PROTEIN: 6.6 g/dL (ref 6.0–8.3)
Total Bilirubin: 0.7 mg/dL (ref 0.2–1.2)

## 2013-12-05 LAB — LDL CHOLESTEROL, DIRECT: LDL DIRECT: 162 mg/dL

## 2013-12-05 LAB — HEMOGLOBIN A1C: HEMOGLOBIN A1C: 6.9 % — AB (ref 4.6–6.5)

## 2013-12-05 NOTE — Telephone Encounter (Signed)
What labs and dx?  

## 2013-12-06 ENCOUNTER — Ambulatory Visit: Payer: Self-pay | Admitting: Pain Medicine

## 2013-12-06 DIAGNOSIS — M5020 Other cervical disc displacement, unspecified cervical region: Secondary | ICD-10-CM | POA: Diagnosis not present

## 2013-12-06 DIAGNOSIS — M5030 Other cervical disc degeneration, unspecified cervical region: Secondary | ICD-10-CM | POA: Diagnosis not present

## 2013-12-06 DIAGNOSIS — Z79899 Other long term (current) drug therapy: Secondary | ICD-10-CM | POA: Diagnosis not present

## 2013-12-06 DIAGNOSIS — Z981 Arthrodesis status: Secondary | ICD-10-CM | POA: Diagnosis not present

## 2013-12-06 DIAGNOSIS — M5137 Other intervertebral disc degeneration, lumbosacral region: Secondary | ICD-10-CM | POA: Diagnosis not present

## 2013-12-07 ENCOUNTER — Ambulatory Visit (INDEPENDENT_AMBULATORY_CARE_PROVIDER_SITE_OTHER): Payer: BC Managed Care – PPO | Admitting: Internal Medicine

## 2013-12-07 ENCOUNTER — Encounter: Payer: Self-pay | Admitting: Internal Medicine

## 2013-12-07 VITALS — BP 138/84 | HR 86 | Temp 98.6°F | Resp 16 | Ht 62.0 in | Wt 179.5 lb

## 2013-12-07 DIAGNOSIS — K2100 Gastro-esophageal reflux disease with esophagitis, without bleeding: Secondary | ICD-10-CM

## 2013-12-07 DIAGNOSIS — E785 Hyperlipidemia, unspecified: Secondary | ICD-10-CM

## 2013-12-07 DIAGNOSIS — E669 Obesity, unspecified: Secondary | ICD-10-CM

## 2013-12-07 DIAGNOSIS — Z8619 Personal history of other infectious and parasitic diseases: Secondary | ICD-10-CM | POA: Insufficient documentation

## 2013-12-07 DIAGNOSIS — I5022 Chronic systolic (congestive) heart failure: Secondary | ICD-10-CM

## 2013-12-07 DIAGNOSIS — E119 Type 2 diabetes mellitus without complications: Secondary | ICD-10-CM

## 2013-12-07 DIAGNOSIS — F3289 Other specified depressive episodes: Secondary | ICD-10-CM

## 2013-12-07 DIAGNOSIS — F329 Major depressive disorder, single episode, unspecified: Secondary | ICD-10-CM

## 2013-12-07 DIAGNOSIS — F32A Depression, unspecified: Secondary | ICD-10-CM

## 2013-12-07 DIAGNOSIS — I10 Essential (primary) hypertension: Secondary | ICD-10-CM

## 2013-12-07 DIAGNOSIS — Z789 Other specified health status: Secondary | ICD-10-CM

## 2013-12-07 DIAGNOSIS — Z950 Presence of cardiac pacemaker: Secondary | ICD-10-CM | POA: Insufficient documentation

## 2013-12-07 LAB — HM DIABETES FOOT EXAM: HM Diabetic Foot Exam: NORMAL

## 2013-12-07 MED ORDER — CITALOPRAM HYDROBROMIDE 20 MG PO TABS: 20.0000 mg | ORAL_TABLET | Freq: Every day | ORAL | Status: DC

## 2013-12-07 NOTE — Assessment & Plan Note (Addendum)
Wt loss of 3 lbs despite overindulging .  Encouragement givren and diet/exercise paln reviewed.  She is not a candidate for pharmacologic therapy given her history of long QT syndrome and cardiomyopathy.

## 2013-12-07 NOTE — Patient Instructions (Addendum)
You are doing well inspite of your indulgences.  Your goal weight is 163 to get your BMI < 30  We are going to try switching from sertraline to citalopram to help with the sugar cravings   This is  my version of a  "Low GI"  Diet:  It will still lower your blood sugars and allow you to lose 4 to 8  lbs  per month if you follow it carefully.  Your goal with exercise is a minimum of 30 minutes of aerobic exercise 5 days per week (Walking does not count once it becomes easy!)      All of the foods can be found at grocery stores and in bulk at [employer].  The Atkins protein bars and shakes are available in more varieties at [employer], [employer] and [place].     7 AM Breakfast:  Choose from the following:  Low carbohydrate Protein  Shakes (I recommend the EAS AdvantEdge "Carb Control" shakes  Or the low carb shakes by Atkins.    2.5 carbs   Arnold's "Sandwhich Thin"toasted  w/ peanut butter (no jelly: about 20 net carbs  "Bagel Thin" with cream cheese and salmon: about 20 carbs   a scrambled egg/bacon/cheese burrito made with Mission's "carb balance" whole wheat tortilla  (about 10 net carbs )  A slice of home made fritatta (egg based dish without a crust:  google it)    Avoid cereal and bananas, oatmeal and cream of wheat and grits. They are loaded with carbohydrates!   10 AM: high protein snack  Protein bar by Atkins (the snack size, under 200 cal, usually < 6 net carbs).    A stick of cheese:  Around 1 carb,  100 cal     Dannon Light n Fit [place] Yogurt  (80 cal, 8 carbs)  Other so called "protein bars" and Greek yogurts tend to be loaded with carbohydrates.  Remember, in food advertising, the word "energy" is synonymous for " carbohydrate."  Lunch:   A Sandwich using the bread choices listed, Can use any  Eggs,  lunchmeat, grilled meat or canned tuna), avocado, regular mayo/mustard  and cheese.  A Salad using blue cheese, ranch,  Goddess or vinagrette,  No croutons or "confetti" and no  "candied nuts" but regular nuts OK.   No pretzels or chips.  Pickles and miniature sweet peppers are a good low carb alternative that provide a "crunch"  The bread is the only source of carbohydrate in a sandwich and  can be decreased by trying some of these alternatives to traditional loaf bread  Joseph's makes a pita bread and a flat bread that are 50 cal and 4 net carbs available at [place] and [place].  This can be toasted to use with hummous as well  Toufayan makes a low carb flatbread that's 100 cal and 9 net carbs available at [employer] and [employer]'s makes 2 sizes of  Low carb whole wheat tortilla  (The large one is 210 cal and 6 net carbs) Avoid "Low fat dressings, as well as Barry Brunner and [place] dressings They are loaded with sugar!   3 PM/ Mid day  Snack:  Consider  1 ounce of  almonds, walnuts, pistachios, pecans, peanuts,  Macadamia nuts or a nut medley.  Avoid "granola"; the dried cranberries and raisins are loaded with carbohydrates. Mixed nuts as long as there are no raisins,  cranberries or dried fruit.    Try the prosciutto/mozzarella cheese  sticks by [employer]ruci  In deli /backery section   High protein   To avoid overindulging in snacks: Try drinking a glass of unsweeted almond/coconut milk  Or a cup of coffee with your Atkins chocolate bar t o keep you from having 3!!!        6 PM  Dinner:     Meat/fowl/fish with a green salad, and either broccoli, cauliflower, green beans, spinach, brussel sprouts or  Lima beans. DO NOT BREAD THE PROTEIN!!      There is a low carb pasta by Dreamfield's that is acceptable and tastes great: only 5 digestible carbs/serving.( All grocery stores but BJs carry it )  Try Hurley Cisco Angelo's chicken piccata or chicken or eggplant parm over low carb pasta.(Lowes and BJs)   Marjory Lies Sanchez's "Carnitas" (pulled pork, no sauce,  0 carbs) or his beef pot roast to make a dinner burrito (at BJ's)  Pesto over low carb pasta (bj's sells a good quality pesto  in the center refrigerated section of the deli   Try satueeing  Cheral Marker with mushroooms  Whole wheat pasta is still full of digestible carbs and  Not as low in glycemic index as Dreamfield's.   Brown rice is still rice,  So skip the rice and noodles if you eat [place] or [place] (or at least limit to 1/2 cup)  9 PM snack :   Breyer's "low carb" fudgsicle or  ice cream bar (Carb Smart line), or  Weight Watcher's ice cream bar , or another "no sugar added" ice cream;  a serving of fresh berries/cherries with whipped cream   Cheese or DANNON'S LlGHT N FIT GREEK YOGURT or the [place]os greek yogurt   8 ounces of [place] unsweetened almond/cococunut milk    Avoid bananas, pineapple, grapes  and watermelon on a regular basis because they are high in sugar.  THINK OF THEM AS DESSERT  Remember that snack Substitutions should be less than 10 NET carbs per serving and meals < 20 carbs. Remember to subtract fiber grams to get the "net carbs."

## 2013-12-07 NOTE — Assessment & Plan Note (Signed)
intolrant of all statins due to myalgais even with repeat trials

## 2013-12-07 NOTE — Assessment & Plan Note (Signed)
swithcing from zoloft to celexa to help with sugar cravings.

## 2013-12-09 ENCOUNTER — Encounter: Payer: Self-pay | Admitting: Internal Medicine

## 2013-12-09 NOTE — Assessment & Plan Note (Signed)
Well-controlled on current medications.  hemoglobin A1c has been consistently less than 7.0 . She had her annual eye exam in January  ; her foot exam is normal today.   Lab Results  Component Value Date   HGBA1C 6.9* 12/05/2013   Lab Results  Component Value Date   MICROALBUR 0.4 08/17/2013

## 2013-12-09 NOTE — Assessment & Plan Note (Addendum)
LDL and triglycerides are above goal due to statin intolerance but her triglycerides have improved to < 300. She has no prior trial of Tricor or gemfibrozil but is reluctant to start either due to fear of myalgias .  Discussed repeating lipdis again in three months and trial of tricor at that time.

## 2013-12-09 NOTE — Progress Notes (Signed)
Patient ID: Julia Pierce, female   DOB: Nov 05, 1955, 58 y.o.   MRN: [id number]   Patient Active Problem List   Diagnosis Date Noted  . History of shingles 12/07/2013  . S/P placement of cardiac pacemaker 12/07/2013  . Chronic systolic heart failure 26/83/4196  . Syncope and collapse 05/31/2013  . Syncope 05/31/2013  . Cervical stenosis of spine 05/11/2013  . Erythema migrans (Lyme disease) 12/06/2012  . Tick bite of hip 09/13/2012  . Statin intolerance 03/03/2012  . Reflux esophagitis 12/31/2011  . Diabetes mellitus type 2, diet-controlled 09/27/2011  . Obesity (BMI 30-39.9) 05/12/2011  . Morton's neuroma   . Valvular cardiomyopathy 03/31/2011  . Hyperlipidemia with target LDL less than 100 03/31/2011  . Long QT syndrome 02/05/2011  . Hypertension   . Depression   . Holter monitor, abnormal     Subjective:  CC:   Chief Complaint  Patient presents with  . Follow-up  . Diabetes    HPI:   Julia Pierce is a 58 y.o. female who presents for Follow up on chronic conditions including DM Type 2, hyperlipidemia, obesity and long QT syndrome s/p pacemaker.  She has no complaints today, and continues to see the Pain clinic for management of back and neck pain secondary to spinal stenosis. She checks her blood sugars infrequently,  And is following a low glycemic index diet most days.  Exercise is limited by her back pain, but she does swim regularly for exercise.    Past Medical History  Diagnosis Date  . Arthritis   . Chicken pox   . Headache, frequent episodic tension-type   . Cardiac arrhythmia due to congenital heart disease   . History of high blood pressure     readings  . High cholesterol   . Migraines   . Hx of colonoscopy sept 2012    normal,  next due 2022, Julia Pierce  . Hypertension   . Depression   . Holter monitor, abnormal August 2012    done for long QT.  some contractile asynchrony Julia Pierce)  . Morton's neuroma     bilateral  . Syncope     Past Surgical  History  Procedure Laterality Date  . Appendectomy  1973  . Tonsillectomy and adenoidectomy  1966  . Spinal fusion  03/2007    ruptured disc  L5  Julia Pierce  . Hysteroscopy  2005    heavy bleeding  . Abdominal hysterectomy  Dec 2013    Julia Pierce  . Bi-ventricular pacemaker insertion (crt-p)  05/2013    MDT CRTP implanted by Dr Julia Pierce for cardiomyopathy, LBBB, and syncope       The following portions of the patient's history were reviewed and updated as appropriate: Allergies, current medications, and problem list.    Review of Systems:   Patient denies headache, fevers, malaise, unintentional weight loss, skin rash, eye pain, sinus congestion and sinus pain, sore throat, dysphagia,  hemoptysis , cough, dyspnea, wheezing, chest pain, palpitations, orthopnea, edema, abdominal pain, nausea, melena, diarrhea, constipation, flank pain, dysuria, hematuria, urinary  Frequency, nocturia, numbness, tingling, seizures,  Focal weakness, Loss of consciousness,  Tremor, insomnia, depression, anxiety, and suicidal ideation.     History   Social History  . Marital Status: Married    Spouse Name: N/A    Number of Children: N/A  . Years of Education: N/A   Occupational History  . Not on file.   Social History Main Topics  . Smoking status: Never Smoker   . Smokeless tobacco:  Never Used  . Alcohol Use: No  . Drug Use: No  . Sexual Activity: Not on file   Other Topics Concern  . Not on file   Social History Narrative  . No narrative on file    Objective:  Filed Vitals:   12/07/13 0902  BP: 138/84  Pulse: 86  Temp: 98.6 F (37 C)  Resp: 16     General appearance: alert, cooperative and appears stated age Ears: normal TM's and external ear canals both ears Throat: lips, mucosa, and tongue normal; teeth and gums normal Neck: no adenopathy, no carotid bruit, supple, symmetrical, trachea midline and thyroid not enlarged, symmetric, no tenderness/mass/nodules Back: symmetric, no  curvature. ROM normal. No CVA tenderness. Lungs: clear to auscultation bilaterally Heart: regular rate and rhythm, S1, S2 normal, no murmur, click, rub or gallop Abdomen: soft, non-tender; bowel sounds normal; no masses,  no organomegaly Pulses: 2+ and symmetric Skin: Skin color, texture, turgor normal. No rashes or lesions Lymph nodes: Cervical, supraclavicular, and axillary nodes normal.  Assessment and Plan:  Obesity (BMI 30-39.9) Wt loss of 3 lbs despite overindulging .  Encouragement givren and diet/exercise paln reviewed.  She is not a candidate for pharmacologic therapy given her history of long QT syndrome and cardiomyopathy.   Statin intolerance intolrant of all statins due to myalgais even with repeat trials   Depression swithcing from zoloft to celexa to help with sugar cravings.   Chronic systolic heart failure She is asymptomatic at rest and is able to to exercise regularly  Diabetes mellitus type 2, diet-controlled Well-controlled on current medications.  hemoglobin A1c has been consistently less than 7.0 . She had her annual eye exam in January  ; her foot exam is normal today.   Lab Results  Component Value Date   HGBA1C 6.9* 12/05/2013   Lab Results  Component Value Date   MICROALBUR 0.4 08/17/2013         Hyperlipidemia with target LDL less than 100 LDL and triglycerides are above goal due to statin intolerance but her triglycerides have improved to < 300. She has no prior trial of Tricor or gemfibrozil but is reluctant to start either due to fear of myalgias .  Discussed repeating lipdis again in three months and trial of tricor at that time.    Updated Medication List Outpatient Encounter Prescriptions as of 12/07/2013  Medication Sig  . aspirin EC 81 MG tablet Take 81 mg by mouth daily.  . Azelaic Acid (FINACEA EX) Apply 1 application topically 2 (two) times daily.  . B Complex Vitamins (B COMPLEX PO) Take 1 tablet by mouth daily.   . calcium  carbonate (OS-CAL) 600 MG TABS Take 600 mg by mouth daily.    . Cholecalciferol (VITAMIN D3) 2000 UNITS TABS Take 1 tablet by mouth daily.  . diphenhydrAMINE (BENADRYL) 25 MG tablet Take 25 mg by mouth at bedtime as needed for sleep.   Marland Kitchen esomeprazole (NEXIUM) 20 MG capsule Take 40 mg by mouth daily at 12 noon.  . fluticasone (FLONASE) 50 MCG/ACT nasal spray Place 2 sprays into both nostrils daily.  Marland Kitchen HYDROcodone-acetaminophen (NORCO) 5-325 MG per tablet Take 1 tablet by mouth every 6 (six) hours as needed for moderate pain.   Marland Kitchen losartan-hydrochlorothiazide (HYZAAR) 50-12.5 MG per tablet TAKE 1 TABLET BY MOUTH DAILY.  . methocarbamol (ROBAXIN) 500 MG tablet Take 1 tablet (500 mg total) by mouth every 8 (eight) hours as needed for muscle spasms.  . metoprolol tartrate (LOPRESSOR) 25 MG  tablet Take 1 tablet (25 mg total) by mouth 2 (two) times daily.  . Multiple Vitamin (MULTIVITAMIN) tablet Take 1 tablet by mouth daily.    Marland Kitchen spironolactone (ALDACTONE) 25 MG tablet Take 25 mg by mouth daily.  Marland Kitchen tolterodine (DETROL) 2 MG tablet Take 2 mg by mouth 2 (two) times daily.  . [DISCONTINUED] losartan-hydrochlorothiazide (HYZAAR) 50-12.5 MG per tablet Take 1 tablet by mouth daily.  . [DISCONTINUED] sertraline (ZOLOFT) 100 MG tablet TAKE 1 TABLET BY MOUTH DAILY  . citalopram (CELEXA) 20 MG tablet Take 1 tablet (20 mg total) by mouth daily.     Orders Placed This Encounter  Procedures  . HM DIABETES EYE EXAM  . [facility] DIABETES FOOT EXAM    Return for follow up diabetes.

## 2013-12-09 NOTE — Assessment & Plan Note (Addendum)
She is asymptomatic at rest and is able to to exercise regularly

## 2013-12-11 ENCOUNTER — Telehealth: Payer: Self-pay | Admitting: Internal Medicine

## 2013-12-11 NOTE — Telephone Encounter (Signed)
Her wellness form requires measurement of her waist,  plesae have her self measure and let us know so we can add it to the form before faxing in . Form is in red folder i return to you on Monday

## 2013-12-13 ENCOUNTER — Telehealth: Payer: Self-pay | Admitting: Cardiology

## 2013-12-13 ENCOUNTER — Encounter: Payer: BC Managed Care – PPO | Admitting: *Deleted

## 2013-12-13 NOTE — Telephone Encounter (Signed)
LMOVM reminding pt to send remote transmission.   

## 2013-12-13 NOTE — Telephone Encounter (Signed)
Patient notified waist measurement given of 39 inches, form completed and faxed as requested. Copy made for chart and original placed at front desk for pick up.

## 2013-12-16 ENCOUNTER — Encounter: Payer: Self-pay | Admitting: Cardiology

## 2013-12-20 LAB — HM MAMMOGRAPHY

## 2013-12-27 ENCOUNTER — Ambulatory Visit (INDEPENDENT_AMBULATORY_CARE_PROVIDER_SITE_OTHER): Payer: BC Managed Care – PPO | Admitting: *Deleted

## 2013-12-27 ENCOUNTER — Encounter: Payer: Self-pay | Admitting: Internal Medicine

## 2013-12-27 DIAGNOSIS — I5022 Chronic systolic (congestive) heart failure: Secondary | ICD-10-CM

## 2013-12-27 DIAGNOSIS — I428 Other cardiomyopathies: Secondary | ICD-10-CM

## 2014-01-02 ENCOUNTER — Telehealth: Payer: Self-pay | Admitting: Internal Medicine

## 2014-01-02 NOTE — Telephone Encounter (Signed)
New message ° ° ° ° ° °Did we get her remote transmission? °

## 2014-01-02 NOTE — Telephone Encounter (Signed)
Spoke w/pt to let know transmission did not go thru. Verified SN for monitor and the monitor SN in [facility] is different than monitor pt is using. [facility] was called to change SN. Spoke w/Nikki at [facility] to change SN. Transmission was received. Pt aware.

## 2014-01-03 NOTE — Progress Notes (Signed)
Remote pacemaker transmission.   

## 2014-01-04 ENCOUNTER — Ambulatory Visit: Payer: Self-pay | Admitting: Pain Medicine

## 2014-01-04 DIAGNOSIS — M47817 Spondylosis without myelopathy or radiculopathy, lumbosacral region: Secondary | ICD-10-CM | POA: Diagnosis not present

## 2014-01-04 DIAGNOSIS — M47812 Spondylosis without myelopathy or radiculopathy, cervical region: Secondary | ICD-10-CM | POA: Diagnosis not present

## 2014-01-22 LAB — MDC_IDC_ENUM_SESS_TYPE_REMOTE
Brady Statistic AP VS Percent: 0.01 %
Brady Statistic AS VP Percent: 99.89 %
Brady Statistic AS VS Percent: 0.07 %
Brady Statistic RA Percent Paced: 0.04 %
Date Time Interrogation Session: 20150908173330
Lead Channel Impedance Value: 399 Ohm
Lead Channel Impedance Value: 418 Ohm
Lead Channel Impedance Value: 475 Ohm
Lead Channel Impedance Value: 475 Ohm
Lead Channel Impedance Value: 532 Ohm
Lead Channel Impedance Value: 608 Ohm
Lead Channel Pacing Threshold Amplitude: 1.875 V
Lead Channel Pacing Threshold Pulse Width: 0.4 ms
Lead Channel Pacing Threshold Pulse Width: 0.4 ms
Lead Channel Pacing Threshold Pulse Width: 0.4 ms
Lead Channel Sensing Intrinsic Amplitude: 2 mV
Lead Channel Setting Pacing Amplitude: 1.5 V
Lead Channel Setting Pacing Amplitude: 2 V
Lead Channel Setting Pacing Amplitude: 3 V
Lead Channel Setting Pacing Pulse Width: 0.4 ms
Lead Channel Setting Pacing Pulse Width: 0.4 ms
MDC IDC MSMT BATTERY REMAINING LONGEVITY: 84 mo
MDC IDC MSMT BATTERY VOLTAGE: 3.02 V
MDC IDC MSMT LEADCHNL LV IMPEDANCE VALUE: 380 Ohm
MDC IDC MSMT LEADCHNL LV IMPEDANCE VALUE: 513 Ohm
MDC IDC MSMT LEADCHNL LV IMPEDANCE VALUE: 646 Ohm
MDC IDC MSMT LEADCHNL RA PACING THRESHOLD AMPLITUDE: 0.5 V
MDC IDC MSMT LEADCHNL RV PACING THRESHOLD AMPLITUDE: 0.625 V
MDC IDC MSMT LEADCHNL RV SENSING INTR AMPL: 21.625 mV
MDC IDC SET LEADCHNL RV SENSING SENSITIVITY: 0.9 mV
MDC IDC SET ZONE DETECTION INTERVAL: 400 ms
MDC IDC STAT BRADY AP VP PERCENT: 0.03 %
MDC IDC STAT BRADY RV PERCENT PACED: 99.92 %
Zone Setting Detection Interval: 350 ms

## 2014-02-07 ENCOUNTER — Ambulatory Visit: Payer: Self-pay | Admitting: Pain Medicine

## 2014-02-07 DIAGNOSIS — M7711 Lateral epicondylitis, right elbow: Secondary | ICD-10-CM | POA: Diagnosis not present

## 2014-02-07 DIAGNOSIS — M5033 Other cervical disc degeneration, cervicothoracic region: Secondary | ICD-10-CM | POA: Diagnosis not present

## 2014-02-07 DIAGNOSIS — M7701 Medial epicondylitis, right elbow: Secondary | ICD-10-CM | POA: Diagnosis not present

## 2014-02-07 DIAGNOSIS — M5137 Other intervertebral disc degeneration, lumbosacral region: Secondary | ICD-10-CM | POA: Diagnosis not present

## 2014-02-09 ENCOUNTER — Encounter: Payer: Self-pay | Admitting: Internal Medicine

## 2014-02-10 ENCOUNTER — Encounter: Payer: Self-pay | Admitting: *Deleted

## 2014-02-13 ENCOUNTER — Ambulatory Visit: Payer: Self-pay | Admitting: Pain Medicine

## 2014-02-13 DIAGNOSIS — M5137 Other intervertebral disc degeneration, lumbosacral region: Secondary | ICD-10-CM | POA: Diagnosis not present

## 2014-02-13 DIAGNOSIS — M5033 Other cervical disc degeneration, cervicothoracic region: Secondary | ICD-10-CM | POA: Diagnosis not present

## 2014-02-13 DIAGNOSIS — M79601 Pain in right arm: Secondary | ICD-10-CM | POA: Diagnosis not present

## 2014-03-13 ENCOUNTER — Ambulatory Visit: Payer: Self-pay | Admitting: Pain Medicine

## 2014-03-13 DIAGNOSIS — M5137 Other intervertebral disc degeneration, lumbosacral region: Secondary | ICD-10-CM | POA: Diagnosis not present

## 2014-03-13 DIAGNOSIS — M5033 Other cervical disc degeneration, cervicothoracic region: Secondary | ICD-10-CM | POA: Diagnosis not present

## 2014-03-15 ENCOUNTER — Telehealth: Payer: Self-pay | Admitting: *Deleted

## 2014-03-15 DIAGNOSIS — I428 Other cardiomyopathies: Secondary | ICD-10-CM

## 2014-03-15 DIAGNOSIS — E119 Type 2 diabetes mellitus without complications: Secondary | ICD-10-CM

## 2014-03-15 NOTE — Telephone Encounter (Signed)
Pt is coming in Monday what labs and dx? 

## 2014-03-20 ENCOUNTER — Other Ambulatory Visit (INDEPENDENT_AMBULATORY_CARE_PROVIDER_SITE_OTHER): Payer: BC Managed Care – PPO

## 2014-03-20 DIAGNOSIS — E119 Type 2 diabetes mellitus without complications: Secondary | ICD-10-CM

## 2014-03-20 LAB — COMPREHENSIVE METABOLIC PANEL
ALT: 17 U/L (ref 0–35)
AST: 22 U/L (ref 0–37)
Albumin: 3.8 g/dL (ref 3.5–5.2)
Alkaline Phosphatase: 72 U/L (ref 39–117)
BUN: 17 mg/dL (ref 6–23)
CALCIUM: 8.8 mg/dL (ref 8.4–10.5)
CHLORIDE: 102 meq/L (ref 96–112)
CO2: 25 meq/L (ref 19–32)
Creatinine, Ser: 0.8 mg/dL (ref 0.4–1.2)
GFR: 83.88 mL/min (ref >60.00)
GLUCOSE: 104 mg/dL — AB (ref 70–99)
Potassium: 4.2 mEq/L (ref 3.5–5.1)
Sodium: 138 mEq/L (ref 135–145)
Total Bilirubin: 0.4 mg/dL (ref 0.2–1.2)
Total Protein: 7 g/dL (ref 6.0–8.3)

## 2014-03-20 LAB — LIPID PANEL
Cholesterol: 221 mg/dL — ABNORMAL HIGH (ref 0–200)
HDL: 35.2 mg/dL — ABNORMAL LOW (ref >39.00)
NonHDL: 185.8
TRIGLYCERIDES: 243 mg/dL — AB (ref 0.0–149.0)
Total CHOL/HDL Ratio: 6
VLDL: 48.6 mg/dL — ABNORMAL HIGH (ref 0.0–40.0)

## 2014-03-20 LAB — LDL CHOLESTEROL, DIRECT: Direct LDL: 135.2 mg/dL

## 2014-03-20 LAB — HEMOGLOBIN A1C: Hgb A1c MFr Bld: 6.9 % — ABNORMAL HIGH (ref 4.6–6.5)

## 2014-03-20 NOTE — Telephone Encounter (Signed)
What labs and dx?  

## 2014-03-21 ENCOUNTER — Encounter: Payer: Self-pay | Admitting: Internal Medicine

## 2014-03-21 ENCOUNTER — Ambulatory Visit (INDEPENDENT_AMBULATORY_CARE_PROVIDER_SITE_OTHER): Payer: BC Managed Care – PPO | Admitting: Internal Medicine

## 2014-03-21 VITALS — BP 124/82 | HR 80 | Ht 60.0 in | Wt 178.0 lb

## 2014-03-21 DIAGNOSIS — E669 Obesity, unspecified: Secondary | ICD-10-CM | POA: Diagnosis not present

## 2014-03-21 DIAGNOSIS — I5022 Chronic systolic (congestive) heart failure: Secondary | ICD-10-CM

## 2014-03-21 DIAGNOSIS — I10 Essential (primary) hypertension: Secondary | ICD-10-CM

## 2014-03-21 DIAGNOSIS — Z950 Presence of cardiac pacemaker: Secondary | ICD-10-CM | POA: Diagnosis not present

## 2014-03-21 DIAGNOSIS — I428 Other cardiomyopathies: Secondary | ICD-10-CM | POA: Diagnosis not present

## 2014-03-21 LAB — MDC_IDC_ENUM_SESS_TYPE_INCLINIC
Brady Statistic AP VP Percent: 0.05 %
Brady Statistic AP VS Percent: 0.01 %
Brady Statistic AS VP Percent: 99.88 %
Brady Statistic RA Percent Paced: 0.06 %
Brady Statistic RV Percent Paced: 99.92 %
Date Time Interrogation Session: 20151201131314
Lead Channel Impedance Value: 437 Ohm
Lead Channel Impedance Value: 437 Ohm
Lead Channel Impedance Value: 475 Ohm
Lead Channel Impedance Value: 513 Ohm
Lead Channel Pacing Threshold Amplitude: 0.75 V
Lead Channel Pacing Threshold Amplitude: 1.875 V
Lead Channel Pacing Threshold Pulse Width: 0.4 ms
Lead Channel Sensing Intrinsic Amplitude: 18 mV
Lead Channel Sensing Intrinsic Amplitude: 2.875 mV
Lead Channel Sensing Intrinsic Amplitude: 20.25 mV
Lead Channel Setting Pacing Amplitude: 2 V
Lead Channel Setting Pacing Pulse Width: 0.4 ms
Lead Channel Setting Pacing Pulse Width: 0.8 ms
MDC IDC MSMT BATTERY REMAINING LONGEVITY: 78 mo
MDC IDC MSMT BATTERY VOLTAGE: 3.01 V
MDC IDC MSMT LEADCHNL LV IMPEDANCE VALUE: 380 Ohm
MDC IDC MSMT LEADCHNL LV IMPEDANCE VALUE: 551 Ohm
MDC IDC MSMT LEADCHNL LV IMPEDANCE VALUE: 589 Ohm
MDC IDC MSMT LEADCHNL LV PACING THRESHOLD PULSEWIDTH: 0.4 ms
MDC IDC MSMT LEADCHNL RA IMPEDANCE VALUE: 399 Ohm
MDC IDC MSMT LEADCHNL RA IMPEDANCE VALUE: 399 Ohm
MDC IDC MSMT LEADCHNL RA PACING THRESHOLD AMPLITUDE: 0.5 V
MDC IDC MSMT LEADCHNL RA SENSING INTR AMPL: 1.625 mV
MDC IDC MSMT LEADCHNL RV PACING THRESHOLD PULSEWIDTH: 0.4 ms
MDC IDC SET LEADCHNL LV PACING AMPLITUDE: 2.5 V
MDC IDC SET LEADCHNL RA PACING AMPLITUDE: 1.5 V
MDC IDC SET LEADCHNL RV SENSING SENSITIVITY: 0.9 mV
MDC IDC SET ZONE DETECTION INTERVAL: 400 ms
MDC IDC STAT BRADY AS VS PERCENT: 0.07 %
Zone Setting Detection Interval: 350 ms

## 2014-03-21 NOTE — Progress Notes (Signed)
HPI Julia Pierce returns today for followup. She is a very pleasant 58 yo woman with chronic LBBB, chronic systolic heart failure, and syncope, s/p insertion of a BiV PPM. She has a nonischemic cardiomyopathy. In the interim she has done well. No recurrent syncope. No chest pain or sob or peripheral edema. Allergies  Allergen Reactions  . Cephalexin Anaphylaxis  . Penicillin G Hives, Shortness Of Breath and Swelling  . Penicillins Hives, Shortness Of Breath and Swelling  . Atorvastatin     myalgias  . Statins     Severe myalgias  . Latex Rash     Current Outpatient Prescriptions  Medication Sig Dispense Refill  . aspirin EC 81 MG tablet Take 81 mg by mouth daily.    . Azelaic Acid (FINACEA EX) Apply 1 application topically 2 (two) times daily.    . B Complex Vitamins (B COMPLEX PO) Take 1 tablet by mouth daily.     . calcium carbonate (OS-CAL) 600 MG TABS Take 600 mg by mouth daily.      . Cholecalciferol (VITAMIN D3) 2000 UNITS TABS Take 1 tablet by mouth daily.    . diphenhydrAMINE (BENADRYL) 25 MG tablet Take 25 mg by mouth at bedtime as needed for sleep.     Marland Kitchen esomeprazole (NEXIUM) 20 MG capsule Take 40 mg by mouth daily at 12 noon.    . fluticasone (FLONASE) 50 MCG/ACT nasal spray Place 2 sprays into both nostrils daily.    Marland Kitchen HYDROcodone-acetaminophen (NORCO) 5-325 MG per tablet Take 1 tablet by mouth every 6 (six) hours as needed for moderate pain.     Marland Kitchen losartan-hydrochlorothiazide (HYZAAR) 50-12.5 MG per tablet TAKE 1 TABLET BY MOUTH DAILY. 90 tablet 1  . methocarbamol (ROBAXIN) 500 MG tablet Take 1 tablet (500 mg total) by mouth every 8 (eight) hours as needed for muscle spasms. 270 tablet 3  . metoprolol tartrate (LOPRESSOR) 25 MG tablet TAKE 1/2 TABLET BY MOUTH TWICE A DAY    . Multiple Vitamin (MULTIVITAMIN) tablet Take 1 tablet by mouth daily.      . sertraline (ZOLOFT) 100 MG tablet Take 100 mg by mouth daily.    Marland Kitchen spironolactone (ALDACTONE) 25 MG tablet Take 25  mg by mouth daily.    Marland Kitchen tolterodine (DETROL) 2 MG tablet Take 2 mg by mouth 2 (two) times daily.     No current facility-administered medications for this visit.     Past Medical History  Diagnosis Date  . Arthritis   . Chicken pox   . Headache, frequent episodic tension-type   . Cardiac arrhythmia due to congenital heart disease   . History of high blood pressure     readings  . High cholesterol   . Migraines   . Hx of colonoscopy sept 2012    normal,  next due 2022, [name]  . Hypertension   . Depression   . Holter monitor, abnormal August 2012    done for long QT.  some contractile asynchrony [name])  . Morton's neuroma     bilateral  . Syncope     ROS:   All systems reviewed and negative except as noted in the HPI.   Past Surgical History  Procedure Laterality Date  . Appendectomy  1973  . Tonsillectomy and adenoidectomy  1966  . Spinal fusion  03/2007    ruptured disc  L5  [name]  . Hysteroscopy  2005    heavy bleeding  . Abdominal hysterectomy  Dec  2013    [facility]  . Bi-ventricular pacemaker insertion (crt-p)  05/2013    MDT CRTP implanted by Dr Lovena Le for cardiomyopathy, LBBB, and syncope     Family History  Problem Relation Age of Onset  . Arthritis Mother   . Hyperlipidemia Mother   . Arthritis Father   . Cancer Father     prostate cancer   . Hyperlipidemia Father   . Cancer Other     ovarian,uterus  . Heart disease Other   . Stroke Other   . Learning disabilities Other   . Cirrhosis Mother   . Coronary artery disease Paternal Grandfather 69  . Pulmonary embolism Father 36     History   Social History  . Marital Status: Married    Spouse Name: N/A    Number of Children: N/A  . Years of Education: N/A   Occupational History  . Not on file.   Social History Main Topics  . Smoking status: Never Smoker   . Smokeless tobacco: Never Used  . Alcohol Use: No  . Drug Use: No  . Sexual Activity: Not on file   Other Topics Concern    . Not on file   Social History Narrative     BP 124/82 mmHg  Pulse 80  Ht 5' (1.524 m)  Wt 178 lb (80.74 kg)  BMI 34.76 kg/m2  Physical Exam:  Well appearing obese, midde aged woman, NAD HEENT: Unremarkable Neck:  7 cm JVD, no thyromegally Back:  No CVA tenderness Lungs:  Clear with no wheezes, well healed PPM incision. HEART:  Regular rate rhythm, no murmurs, no rubs, no clicks Abd:  soft, positive bowel sounds, no organomegally, no rebound, no guarding Ext:  2 plus pulses, no edema, no cyanosis, no clubbing Skin:  No rashes no nodules Neuro:  CN II through XII intact, motor grossly intact  ECG - sinus rhythm with biventricular pacing  DEVICE  Normal device function.  See PaceArt for details.   Assess/Plan:

## 2014-03-21 NOTE — Patient Instructions (Signed)
Your physician wants you to follow-up in: 12 months with Dr. Knox Saliva will receive a reminder letter in the mail two months in advance. If you don't receive a letter, please call our office to schedule the follow-up appointment.    Remote monitoring is used to monitor your Pacemaker of ICD from home. This monitoring reduces the number of office visits required to check your device to one time per year. It allows [place] to keep an eye on the functioning of your device to ensure it is working properly. You are scheduled for a device check from home on 06/22/14. You may send your transmission at any time that day. If you have a wireless device, the transmission will be sent automatically. After your physician reviews your transmission, you will receive a postcard with your next transmission date.

## 2014-03-21 NOTE — Assessment & Plan Note (Signed)
Her chronic systolic heart failure is class II. She will continue her current medications and maintain a low-sodium diet.

## 2014-03-21 NOTE — Assessment & Plan Note (Signed)
We discussed the importance of weight loss, and increasing her physical activity. She states that she will try to accomplish this.

## 2014-03-21 NOTE — Assessment & Plan Note (Signed)
Her Medtronic biventricular pacemaker is working normally. We'll plan to recheck in several months. 

## 2014-03-21 NOTE — Assessment & Plan Note (Signed)
Her blood pressure is well controlled. No change in medications. I encouraged the patient to lose weight.

## 2014-03-22 ENCOUNTER — Ambulatory Visit: Payer: Medicare Other | Admitting: Internal Medicine

## 2014-03-28 ENCOUNTER — Encounter: Payer: Self-pay | Admitting: Internal Medicine

## 2014-03-28 ENCOUNTER — Ambulatory Visit (INDEPENDENT_AMBULATORY_CARE_PROVIDER_SITE_OTHER): Payer: BC Managed Care – PPO | Admitting: Internal Medicine

## 2014-03-28 VITALS — BP 146/80 | HR 79 | Temp 97.6°F | Resp 16 | Ht 62.0 in | Wt 178.8 lb

## 2014-03-28 DIAGNOSIS — E119 Type 2 diabetes mellitus without complications: Secondary | ICD-10-CM

## 2014-03-28 DIAGNOSIS — M4802 Spinal stenosis, cervical region: Secondary | ICD-10-CM

## 2014-03-28 DIAGNOSIS — E785 Hyperlipidemia, unspecified: Secondary | ICD-10-CM

## 2014-03-28 DIAGNOSIS — E669 Obesity, unspecified: Secondary | ICD-10-CM

## 2014-03-28 NOTE — Progress Notes (Signed)
Patient ID: Julia Pierce, female   DOB: 08-Sep-1955, 58 y.o.   MRN: [id number]  Patient Active Problem List   Diagnosis Date Noted  . History of shingles 12/07/2013  . S/P placement of cardiac pacemaker 12/07/2013  . Chronic systolic heart failure 76/19/5093  . Syncope and collapse 05/31/2013  . Syncope 05/31/2013  . Cervical stenosis of spine 05/11/2013  . Erythema migrans (Lyme disease) 12/06/2012  . Tick bite of hip 09/13/2012  . Statin intolerance 03/03/2012  . Reflux esophagitis 12/31/2011  . Diabetes mellitus type 2, diet-controlled 09/27/2011  . Obesity (BMI 30-39.9) 05/12/2011  . Morton's neuroma   . Valvular cardiomyopathy 03/31/2011  . Hyperlipidemia with target LDL less than 100 03/31/2011  . Long QT syndrome 02/05/2011  . Hypertension   . Depression   . Holter monitor, abnormal     Subjective:  CC:   Chief Complaint  Patient presents with  . Diabetes  . Follow-up    HPI:   Julia Pierce is a 58 y.o. female who presents for  Dm followup.  She was prescribed Citalopram  At last visit which helped with the sugar cravings, but the medication made her frequently tearful.  She has resumed sertraline,  And has been having episodes of nervousness for unclear  reasons. History of  long QT syndrome s/p pacemaker. Saw cardiology and pacer was interrogated and was fine. Blood sugars have been normal.  She has missed several weeks of her swimming due to family obligations. Her medications and recent labs were reviewed.      She has no complaints today, and continues to see the Pain clinic for management of back and neck pain secondary to spinal stenosis. She checks her blood sugars infrequently,  And is following a low glycemic index diet most days.  Exercise is limited by her back pain, but she does swim regularly for exercise.    Past Medical History  Diagnosis Date  . Arthritis   . Chicken pox   . Headache, frequent episodic tension-type   . Cardiac arrhythmia due to  congenital heart disease   . History of high blood pressure     readings  . High cholesterol   . Migraines   . Hx of colonoscopy sept 2012    normal,  next due 2022, [name]  . Hypertension   . Depression   . Holter monitor, abnormal August 2012    done for long QT.  some contractile asynchrony [name])  . Morton's neuroma     bilateral  . Syncope     Past Surgical History  Procedure Laterality Date  . Appendectomy  1973  . Tonsillectomy and adenoidectomy  1966  . Spinal fusion  03/2007    ruptured disc  L5  [name]  . Hysteroscopy  2005    heavy bleeding  . Abdominal hysterectomy  Dec 2013    [facility]  . Bi-ventricular pacemaker insertion (crt-p)  05/2013    MDT CRTP implanted by Dr Lovena Le for cardiomyopathy, LBBB, and syncope       The following portions of the patient's history were reviewed and updated as appropriate: Allergies, current medications, and problem list.    Review of Systems:   Patient denies headache, fevers, malaise, unintentional weight loss, skin rash, eye pain, sinus congestion and sinus pain, sore throat, dysphagia,  hemoptysis , cough, dyspnea, wheezing, chest pain, palpitations, orthopnea, edema, abdominal pain, nausea, melena, diarrhea, constipation, flank pain, dysuria, hematuria, urinary  Frequency, nocturia, numbness, tingling, seizures,  Focal weakness,  Loss of consciousness,  Tremor, insomnia, depression, anxiety, and suicidal ideation.     History   Social History  . Marital Status: Married    Spouse Name: N/A    Number of Children: N/A  . Years of Education: N/A   Occupational History  . Not on file.   Social History Main Topics  . Smoking status: Never Smoker   . Smokeless tobacco: Never Used  . Alcohol Use: No  . Drug Use: No  . Sexual Activity: Not on file   Other Topics Concern  . Not on file   Social History Narrative    Objective:  Filed Vitals:   03/28/14 1604  BP: 146/80  Pulse: 79  Temp: 97.6 F (36.4  C)  Resp: 16     General appearance: alert, cooperative and appears stated age Ears: normal TM's and external ear canals both ears Throat: lips, mucosa, and tongue normal; teeth and gums normal Neck: no adenopathy, no carotid bruit, supple, symmetrical, trachea midline and thyroid not enlarged, symmetric, no tenderness/mass/nodules Back: symmetric, no curvature. ROM normal. No CVA tenderness. Lungs: clear to auscultation bilaterally Heart: regular rate and rhythm, S1, S2 normal, no murmur, click, rub or gallop Abdomen: soft, non-tender; bowel sounds normal; no masses,  no organomegaly Pulses: 2+ and symmetric Skin: Skin color, texture, turgor normal. No rashes or lesions Lymph nodes: Cervical, supraclavicular, and axillary nodes normal.  Assessment and Plan:  Diabetes mellitus type 2, diet-controlled Well-controlled on current medications.  hemoglobin A1c has been consistently less than 7.0 . She had her annual eye exam in January  ; her foot exam is normal today.   Lab Results  Component Value Date   HGBA1C 6.9* 03/20/2014   Lab Results  Component Value Date   MICROALBUR 0.4 08/17/2013           Hyperlipidemia with target LDL less than 100 LDL and triglycerides are at goal on current medications. She has no side effects and liver enzymes are normal. No changes today   Obesity (BMI 30-39.9) I have addressed  BMI and recommended a low glycemic index diet utilizing smaller more frequent meals to increase metabolism.  I have also recommended that patient start exercising with a goal of 30 minutes of aerobic exercise a minimum of 5 days per week.    Updated Medication List Outpatient Encounter Prescriptions as of 03/28/2014  Medication Sig  . aspirin EC 81 MG tablet Take 81 mg by mouth daily.  . Azelaic Acid (FINACEA EX) Apply 1 application topically 2 (two) times daily.  . B Complex Vitamins (B COMPLEX PO) Take 1 tablet by mouth daily.   . calcium carbonate (OS-CAL)  600 MG TABS Take 600 mg by mouth daily.    . Cholecalciferol (VITAMIN D3) 2000 UNITS TABS Take 1 tablet by mouth daily.  . diphenhydrAMINE (BENADRYL) 25 MG tablet Take 25 mg by mouth at bedtime as needed for sleep.   Marland Kitchen esomeprazole (NEXIUM) 20 MG capsule Take 40 mg by mouth daily at 12 noon.  . fluticasone (FLONASE) 50 MCG/ACT nasal spray Place 2 sprays into both nostrils daily.  Marland Kitchen HYDROcodone-acetaminophen (NORCO) 5-325 MG per tablet Take 1 tablet by mouth every 6 (six) hours as needed for moderate pain.   Marland Kitchen losartan-hydrochlorothiazide (HYZAAR) 50-12.5 MG per tablet TAKE 1 TABLET BY MOUTH DAILY.  . methocarbamol (ROBAXIN) 500 MG tablet Take 1 tablet (500 mg total) by mouth every 8 (eight) hours as needed for muscle spasms.  . metoprolol tartrate (LOPRESSOR) 25  MG tablet TAKE 1/2 TABLET BY MOUTH TWICE A DAY  . Multiple Vitamin (MULTIVITAMIN) tablet Take 1 tablet by mouth daily.    . sertraline (ZOLOFT) 100 MG tablet Take 100 mg by mouth daily.  Marland Kitchen spironolactone (ALDACTONE) 25 MG tablet Take 25 mg by mouth daily.  Marland Kitchen tolterodine (DETROL) 2 MG tablet Take 2 mg by mouth 2 (two) times daily.     No orders of the defined types were placed in this encounter.    Return in about 3 months (around 06/27/2014).

## 2014-03-28 NOTE — Progress Notes (Signed)
Pre-visit discussion using our clinic review tool. No additional management support is needed unless otherwise documented below in the visit note.  

## 2014-03-28 NOTE — Patient Instructions (Signed)
Your diabetes remains under excellent control currently. .Please return in 3 months for follow up on diabetes and make sure you are seeing your eye doctor at least once a year.   Diabetes and Standards of Medical Care Diabetes is complicated. You may find that your diabetes team includes a dietitian, nurse, diabetes educator, eye doctor, and more. To help everyone know what is going on and to help you get the care you deserve, the following schedule of care was developed to help keep you on track. Below are the tests, exams, vaccines, medicines, education, and plans you will need. HbA1c test This test shows how well you have controlled your glucose over the past 2-3 months. It is used to see if your diabetes management plan needs to be adjusted.   It is performed at least 2 times a year if you are meeting treatment goals.  It is performed 4 times a year if therapy has changed or if you are not meeting treatment goals. Blood pressure test  This test is performed at every routine medical visit. The goal is less than 140/90 mm Hg for most people, but 130/80 mm Hg in some cases. Ask your health care provider about your goal. Dental exam  Follow up with the dentist regularly. Eye exam  If you are diagnosed with type 1 diabetes as a child, get an exam upon reaching the age of 62 years or older and have had diabetes for 3-5 years. Yearly eye exams are recommended after that initial eye exam.  If you are diagnosed with type 1 diabetes as an adult, get an exam within 5 years of diagnosis and then yearly.  If you are diagnosed with type 2 diabetes, get an exam as soon as possible after the diagnosis and then yearly. Foot care exam  Visual foot exams are performed at every routine medical visit. The exams check for cuts, injuries, or other problems with the feet.  A comprehensive foot exam should be done yearly. This includes visual inspection as well as assessing foot pulses and testing for loss of  sensation.  Check your feet nightly for cuts, injuries, or other problems with your feet. Tell your health care provider if anything is not healing. Kidney function test (urine microalbumin)  This test is performed once a year.  Type 1 diabetes: The first test is performed 5 years after diagnosis.  Type 2 diabetes: The first test is performed at the time of diagnosis.  A serum creatinine and estimated glomerular filtration rate (eGFR) test is done once a year to assess the level of chronic kidney disease (CKD), if present. Lipid profile (cholesterol, HDL, LDL, triglycerides)  Performed every 5 years for most people.  The goal for LDL is less than 100 mg/dL. If you are at high risk, the goal is less than 70 mg/dL.  The goal for HDL is 40 mg/dL-50 mg/dL for men and 50 mg/dL-60 mg/dL for women. An HDL cholesterol of 60 mg/dL or higher gives some protection against heart disease.  The goal for triglycerides is less than 150 mg/dL. Influenza vaccine, pneumococcal vaccine, and hepatitis B vaccine  The influenza vaccine is recommended yearly.  It is recommended that people with diabetes who are over 64 years old get the pneumonia vaccine. In some cases, two separate shots may be given. Ask your health care provider if your pneumonia vaccination is up to date.  The hepatitis B vaccine is also recommended for adults with diabetes. Diabetes self-management education  Education is  recommended at diagnosis and ongoing as needed. Treatment plan  Your treatment plan is reviewed at every medical visit. Document Released: 02/02/2009 Document Revised: 08/22/2013 Document Reviewed: 09/07/2012 [facility] Patient Information 2015 [place], [place]. This information is not intended to replace advice given to you by your health care provider. Make sure you discuss any questions you have with your health care provider.

## 2014-03-29 NOTE — Assessment & Plan Note (Signed)
LDL and triglycerides are at goal on current medications. She has no side effects and liver enzymes are normal. No changes today.  

## 2014-03-29 NOTE — Assessment & Plan Note (Signed)
Well-controlled on current medications.  hemoglobin A1c has been consistently less than 7.0 . She had her annual eye exam in January  ; her foot exam is normal today.   Lab Results  Component Value Date   HGBA1C 6.9* 03/20/2014   Lab Results  Component Value Date   MICROALBUR 0.4 08/17/2013

## 2014-03-29 NOTE — Assessment & Plan Note (Signed)
I have addressed  BMI and recommended a low glycemic index diet utilizing smaller more frequent meals to increase metabolism.  I have also recommended that patient start exercising with a goal of 30 minutes of aerobic exercise a minimum of 5 days per week.  

## 2014-03-30 ENCOUNTER — Encounter (HOSPITAL_COMMUNITY): Payer: Self-pay | Admitting: Internal Medicine

## 2014-04-05 ENCOUNTER — Ambulatory Visit: Payer: Self-pay | Admitting: Pain Medicine

## 2014-04-05 DIAGNOSIS — M47892 Other spondylosis, cervical region: Secondary | ICD-10-CM | POA: Diagnosis not present

## 2014-04-05 DIAGNOSIS — Z981 Arthrodesis status: Secondary | ICD-10-CM | POA: Diagnosis not present

## 2014-04-05 DIAGNOSIS — M47896 Other spondylosis, lumbar region: Secondary | ICD-10-CM | POA: Diagnosis not present

## 2014-04-11 ENCOUNTER — Encounter: Payer: Self-pay | Admitting: *Deleted

## 2014-04-25 ENCOUNTER — Encounter: Payer: Self-pay | Admitting: Family Medicine

## 2014-04-25 ENCOUNTER — Ambulatory Visit (INDEPENDENT_AMBULATORY_CARE_PROVIDER_SITE_OTHER): Payer: BLUE CROSS/BLUE SHIELD | Admitting: Family Medicine

## 2014-04-25 VITALS — BP 116/78 | HR 80 | Temp 97.8°F | Wt 180.0 lb

## 2014-04-25 DIAGNOSIS — H6982 Other specified disorders of Eustachian tube, left ear: Secondary | ICD-10-CM

## 2014-04-25 DIAGNOSIS — H6980 Other specified disorders of Eustachian tube, unspecified ear: Secondary | ICD-10-CM | POA: Insufficient documentation

## 2014-04-25 NOTE — Assessment & Plan Note (Signed)
New- Discussed course and tx of ETD. Also discussed importance of making sure her symptoms are improving/resolving since it can lead to permanent hearing loss long term. Advised flonase first as she is nervous about trying sudafed at this point.  Has tolerated it ok in past but she does think it may have made her feel jittery. If no resolution of symptoms, did advise she consider oral prednisone and or ENT referral as tubes necessary in refractory cases to prevent long term damage. The patient indicates understanding of these issues and agrees with the plan.

## 2014-04-25 NOTE — Progress Notes (Signed)
Subjective:   Patient ID: Julia Pierce, female    DOB: Aug 27, 1955, 59 y.o.   MRN: [id number]  Clarrisa Kaylor is a pleasant 59 y.o. year old female pt of Dr. Derrel Nip, new to me, who presents to clinic today with ear pressure  on 04/25/2014  HPI:  Had cough, congestion, and classic URI symptoms last week. Was feeling much better than woke up yesterday with left ear fullness and pressure. Decreased hearing in left ear.  No pain, just pressure. Otherwise feels ok.  Not taking anything OTC- given her cardiac history, she does not take OTC meds without seeing MD first.  Current Outpatient Prescriptions on File Prior to Visit  Medication Sig Dispense Refill  . aspirin EC 81 MG tablet Take 81 mg by mouth daily.    . Azelaic Acid (FINACEA EX) Apply 1 application topically 2 (two) times daily.    . B Complex Vitamins (B COMPLEX PO) Take 1 tablet by mouth daily.     . calcium carbonate (OS-CAL) 600 MG TABS Take 600 mg by mouth daily.      . Cholecalciferol (VITAMIN D3) 2000 UNITS TABS Take 1 tablet by mouth daily.    . diphenhydrAMINE (BENADRYL) 25 MG tablet Take 25 mg by mouth at bedtime as needed for sleep.     Marland Kitchen esomeprazole (NEXIUM) 20 MG capsule Take 40 mg by mouth daily at 12 noon.    . fluticasone (FLONASE) 50 MCG/ACT nasal spray Place 2 sprays into both nostrils daily.    Marland Kitchen HYDROcodone-acetaminophen (NORCO) 5-325 MG per tablet Take 1 tablet by mouth every 6 (six) hours as needed for moderate pain.     Marland Kitchen losartan-hydrochlorothiazide (HYZAAR) 50-12.5 MG per tablet TAKE 1 TABLET BY MOUTH DAILY. 90 tablet 1  . methocarbamol (ROBAXIN) 500 MG tablet Take 1 tablet (500 mg total) by mouth every 8 (eight) hours as needed for muscle spasms. 270 tablet 3  . metoprolol tartrate (LOPRESSOR) 25 MG tablet TAKE 1/2 TABLET BY MOUTH TWICE A DAY    . Multiple Vitamin (MULTIVITAMIN) tablet Take 1 tablet by mouth daily.      . sertraline (ZOLOFT) 100 MG tablet Take 100 mg by mouth daily.    Marland Kitchen spironolactone  (ALDACTONE) 25 MG tablet Take 25 mg by mouth daily.    Marland Kitchen tolterodine (DETROL) 2 MG tablet Take 2 mg by mouth 2 (two) times daily.     No current facility-administered medications on file prior to visit.    Allergies  Allergen Reactions  . Cephalexin Anaphylaxis  . Penicillin G Hives, Shortness Of Breath and Swelling  . Penicillins Hives, Shortness Of Breath and Swelling  . Atorvastatin     myalgias  . Statins     Severe myalgias  . Latex Rash    Past Medical History  Diagnosis Date  . Arthritis   . Chicken pox   . Headache, frequent episodic tension-type   . Cardiac arrhythmia due to congenital heart disease   . History of high blood pressure     readings  . High cholesterol   . Migraines   . Hx of colonoscopy sept 2012    normal,  next due 2022, [name]  . Hypertension   . Depression   . Holter monitor, abnormal August 2012    done for long QT.  some contractile asynchrony [name])  . Morton's neuroma     bilateral  . Syncope     Past Surgical History  Procedure Laterality Date  . Appendectomy  1973  . Tonsillectomy and adenoidectomy  1966  . Spinal fusion  03/2007    ruptured disc  L5  [name]  . Hysteroscopy  2005    heavy bleeding  . Abdominal hysterectomy  Dec 2013    [facility]  . Bi-ventricular pacemaker insertion (crt-p)  05/2013    MDT CRTP implanted by Dr Lovena Le for cardiomyopathy, LBBB, and syncope  . Implantable cardioverter defibrillator implant N/A 06/02/2013    Procedure: IMPLANTABLE CARDIOVERTER DEFIBRILLATOR IMPLANT;  Surgeon: Evans Lance, MD;  Location: [facility] CATH LAB;  Service: Cardiovascular;  Laterality: N/A;    Family History  Problem Relation Age of Onset  . Arthritis Mother   . Hyperlipidemia Mother   . Arthritis Father   . Cancer Father     prostate cancer   . Hyperlipidemia Father   . Cancer Other     ovarian,uterus  . Heart disease Other   . Stroke Other   . Learning disabilities Other   . Cirrhosis Mother   . Coronary  artery disease Paternal Grandfather 54  . Pulmonary embolism Father 13    History   Social History  . Marital Status: Married    Spouse Name: N/A    Number of Children: N/A  . Years of Education: N/A   Occupational History  . Not on file.   Social History Main Topics  . Smoking status: Never Smoker   . Smokeless tobacco: Never Used  . Alcohol Use: No  . Drug Use: No  . Sexual Activity: Not on file   Other Topics Concern  . Not on file   Social History Narrative   The PMH, PSH, Social History, Family History, Medications, and allergies have been reviewed in [facility], and have been updated if relevant.   Review of Systems  Constitutional: Negative.   HENT: Positive for hearing loss. Negative for congestion, ear discharge, ear pain, facial swelling, nosebleeds, postnasal drip, rhinorrhea, sinus pressure, sneezing, sore throat and tinnitus.   Neurological: Negative for dizziness.  All other systems reviewed and are negative.      Objective:    BP 116/78 mmHg  Pulse 80  Temp(Src) 97.8 F (36.6 C) (Oral)  Wt 180 lb (81.647 kg)  SpO2 97%   Physical Exam  Constitutional: She is oriented to person, place, and time. She appears well-developed and well-nourished. No distress.  HENT:  Head: Normocephalic.  Right Ear: Hearing, tympanic membrane and ear canal normal.  Left Ear: No drainage or swelling. No mastoid tenderness. Tympanic membrane is not injected, not scarred, not perforated and not erythematous. A middle ear effusion is present. No hemotympanum. Decreased hearing is noted.  Cardiovascular: Normal rate.   Pulmonary/Chest: Effort normal.  Musculoskeletal: Normal range of motion.  Neurological: She is alert and oriented to person, place, and time. No cranial nerve deficit.  Skin: Skin is warm and dry.  Psychiatric: She has a normal mood and affect. Her behavior is normal. Judgment and thought content normal.  Nursing note and vitals reviewed.           Assessment & Plan:   ETD (eustachian tube dysfunction), left No Follow-up on file.

## 2014-04-25 NOTE — Patient Instructions (Signed)
It was nice to meet you. Please start flonase and sudafed as tolerated. Please call your doctor if your symptoms are not better within 48 hours.  Barotitis Media Barotitis media is inflammation of your middle ear. This occurs when the auditory tube (eustachian tube) leading from the back of your nose (nasopharynx) to your eardrum is blocked. This blockage may result from a cold, environmental allergies, or an upper respiratory infection. Unresolved barotitis media may lead to damage or hearing loss (barotrauma), which may become permanent. HOME CARE INSTRUCTIONS   Use medicines as recommended by your health care provider. Over-the-counter medicines will help unblock the canal and can help during times of air travel.  Do not put anything into your ears to clean or unplug them. Eardrops will not be helpful.  Do not swim, dive, or fly until your health care provider says it is all right to do so. If these activities are necessary, chewing gum with frequent, forceful swallowing may help. It is also helpful to hold your nose and gently blow to pop your ears for equalizing pressure changes. This forces air into the eustachian tube.  Only take over-the-counter or prescription medicines for pain, discomfort, or fever as directed by your health care provider.  A decongestant may be helpful in decongesting the middle ear and make pressure equalization easier. SEEK MEDICAL CARE IF:  You experience a serious form of dizziness in which you feel as if the room is spinning and you feel nauseated (vertigo).  Your symptoms only involve one ear. SEEK IMMEDIATE MEDICAL CARE IF:   You develop a severe headache, dizziness, or severe ear pain.  You have bloody or pus-like drainage from your ears.  You develop a fever.  Your problems do not improve or become worse. MAKE SURE YOU:   Understand these instructions.  Will watch your condition.  Will get help right away if you are not doing well or get  worse. Document Released: 04/04/2000 Document Revised: 01/26/2013 Document Reviewed: 11/02/2012 [facility] Patient Information 2015 [place], [place]. This information is not intended to replace advice given to you by your health care provider. Make sure you discuss any questions you have with your health care provider.

## 2014-04-25 NOTE — Telephone Encounter (Signed)
Mailed unread message to pt  

## 2014-05-04 ENCOUNTER — Ambulatory Visit: Payer: Self-pay | Admitting: Pain Medicine

## 2014-05-04 DIAGNOSIS — M5022 Other cervical disc displacement, mid-cervical region: Secondary | ICD-10-CM | POA: Diagnosis not present

## 2014-05-04 DIAGNOSIS — Z9889 Other specified postprocedural states: Secondary | ICD-10-CM | POA: Diagnosis not present

## 2014-05-04 DIAGNOSIS — M5021 Other cervical disc displacement,  high cervical region: Secondary | ICD-10-CM | POA: Diagnosis not present

## 2014-05-04 DIAGNOSIS — M47892 Other spondylosis, cervical region: Secondary | ICD-10-CM | POA: Diagnosis not present

## 2014-05-04 DIAGNOSIS — M129 Arthropathy, unspecified: Secondary | ICD-10-CM | POA: Diagnosis not present

## 2014-05-05 ENCOUNTER — Encounter: Payer: Self-pay | Admitting: Family Medicine

## 2014-05-09 ENCOUNTER — Other Ambulatory Visit: Payer: Self-pay | Admitting: Internal Medicine

## 2014-05-10 DIAGNOSIS — I429 Cardiomyopathy, unspecified: Secondary | ICD-10-CM | POA: Diagnosis not present

## 2014-05-10 DIAGNOSIS — E782 Mixed hyperlipidemia: Secondary | ICD-10-CM | POA: Diagnosis not present

## 2014-05-10 DIAGNOSIS — I10 Essential (primary) hypertension: Secondary | ICD-10-CM | POA: Diagnosis not present

## 2014-05-10 DIAGNOSIS — Z950 Presence of cardiac pacemaker: Secondary | ICD-10-CM | POA: Diagnosis not present

## 2014-05-15 ENCOUNTER — Other Ambulatory Visit: Payer: Self-pay | Admitting: Internal Medicine

## 2014-06-08 ENCOUNTER — Ambulatory Visit: Payer: Self-pay | Admitting: Pain Medicine

## 2014-06-08 DIAGNOSIS — M47892 Other spondylosis, cervical region: Secondary | ICD-10-CM | POA: Diagnosis not present

## 2014-06-08 DIAGNOSIS — R51 Headache: Secondary | ICD-10-CM | POA: Diagnosis not present

## 2014-06-08 DIAGNOSIS — R209 Unspecified disturbances of skin sensation: Secondary | ICD-10-CM | POA: Diagnosis not present

## 2014-06-22 ENCOUNTER — Ambulatory Visit (INDEPENDENT_AMBULATORY_CARE_PROVIDER_SITE_OTHER): Payer: BLUE CROSS/BLUE SHIELD | Admitting: *Deleted

## 2014-06-22 DIAGNOSIS — I5022 Chronic systolic (congestive) heart failure: Secondary | ICD-10-CM

## 2014-06-22 DIAGNOSIS — I429 Cardiomyopathy, unspecified: Secondary | ICD-10-CM | POA: Diagnosis not present

## 2014-06-22 DIAGNOSIS — I428 Other cardiomyopathies: Secondary | ICD-10-CM

## 2014-06-22 LAB — MDC_IDC_ENUM_SESS_TYPE_REMOTE
Battery Remaining Longevity: 70 mo
Brady Statistic AP VP Percent: 0.03 %
Brady Statistic AS VP Percent: 99.88 %
Brady Statistic AS VS Percent: 0.09 %
Brady Statistic RA Percent Paced: 0.03 %
Brady Statistic RV Percent Paced: 99.91 %
Date Time Interrogation Session: 20160303200504
Lead Channel Impedance Value: 380 Ohm
Lead Channel Impedance Value: 399 Ohm
Lead Channel Impedance Value: 437 Ohm
Lead Channel Impedance Value: 494 Ohm
Lead Channel Impedance Value: 513 Ohm
Lead Channel Impedance Value: 551 Ohm
Lead Channel Impedance Value: 589 Ohm
Lead Channel Pacing Threshold Amplitude: 0.625 V
Lead Channel Pacing Threshold Pulse Width: 0.4 ms
Lead Channel Sensing Intrinsic Amplitude: 22.875 mV
Lead Channel Sensing Intrinsic Amplitude: 22.875 mV
Lead Channel Setting Pacing Amplitude: 1.5 V
Lead Channel Setting Pacing Pulse Width: 0.4 ms
Lead Channel Setting Pacing Pulse Width: 0.8 ms
Lead Channel Setting Sensing Sensitivity: 0.9 mV
MDC IDC MSMT BATTERY VOLTAGE: 3.01 V
MDC IDC MSMT LEADCHNL RA IMPEDANCE VALUE: 399 Ohm
MDC IDC MSMT LEADCHNL RA SENSING INTR AMPL: 2.75 mV
MDC IDC MSMT LEADCHNL RV IMPEDANCE VALUE: 437 Ohm
MDC IDC MSMT LEADCHNL RV PACING THRESHOLD AMPLITUDE: 0.75 V
MDC IDC MSMT LEADCHNL RV PACING THRESHOLD PULSEWIDTH: 0.4 ms
MDC IDC SET LEADCHNL LV PACING AMPLITUDE: 2.5 V
MDC IDC SET LEADCHNL RV PACING AMPLITUDE: 2 V
MDC IDC SET ZONE DETECTION INTERVAL: 350 ms
MDC IDC STAT BRADY AP VS PERCENT: 0 %
Zone Setting Detection Interval: 400 ms

## 2014-06-23 NOTE — Progress Notes (Signed)
Remote pacemaker transmission.   

## 2014-06-26 ENCOUNTER — Other Ambulatory Visit (INDEPENDENT_AMBULATORY_CARE_PROVIDER_SITE_OTHER): Payer: BLUE CROSS/BLUE SHIELD

## 2014-06-26 ENCOUNTER — Telehealth: Payer: Self-pay | Admitting: *Deleted

## 2014-06-26 DIAGNOSIS — E785 Hyperlipidemia, unspecified: Secondary | ICD-10-CM

## 2014-06-26 DIAGNOSIS — E119 Type 2 diabetes mellitus without complications: Secondary | ICD-10-CM

## 2014-06-26 DIAGNOSIS — I10 Essential (primary) hypertension: Secondary | ICD-10-CM

## 2014-06-26 LAB — LIPID PANEL
CHOLESTEROL: 226 mg/dL — AB (ref 0–200)
HDL: 39.5 mg/dL (ref >39.00)
Total CHOL/HDL Ratio: 6
Triglycerides: 482 mg/dL — ABNORMAL HIGH (ref 0.0–149.0)

## 2014-06-26 LAB — COMPREHENSIVE METABOLIC PANEL
ALK PHOS: 78 U/L (ref 39–117)
ALT: 24 U/L (ref 0–35)
AST: 22 U/L (ref 0–37)
Albumin: 4.4 g/dL (ref 3.5–5.2)
BILIRUBIN TOTAL: 0.3 mg/dL (ref 0.2–1.2)
BUN: 20 mg/dL (ref 6–23)
CO2: 27 meq/L (ref 19–32)
Calcium: 9.6 mg/dL (ref 8.4–10.5)
Chloride: 103 mEq/L (ref 96–112)
Creatinine, Ser: 0.78 mg/dL (ref 0.40–1.20)
GFR: 80.09 mL/min (ref >60.00)
GLUCOSE: 129 mg/dL — AB (ref 70–99)
Potassium: 4.3 mEq/L (ref 3.5–5.1)
SODIUM: 139 meq/L (ref 135–145)
TOTAL PROTEIN: 7.2 g/dL (ref 6.0–8.3)

## 2014-06-26 LAB — MICROALBUMIN / CREATININE URINE RATIO
CREATININE, U: 86.3 mg/dL
Microalb Creat Ratio: 0.8 mg/g (ref 0.0–30.0)
Microalb, Ur: <0.7 mg/dL (ref 0.0–1.9)

## 2014-06-26 LAB — LDL CHOLESTEROL, DIRECT: LDL DIRECT: 127 mg/dL

## 2014-06-26 LAB — HEMOGLOBIN A1C: Hgb A1c MFr Bld: 7 % — ABNORMAL HIGH (ref 4.6–6.5)

## 2014-06-26 NOTE — Telephone Encounter (Signed)
I hope   you got the urine test what was already in there!  Labs are ordered

## 2014-06-26 NOTE — Telephone Encounter (Signed)
Labs and dX?  

## 2014-06-26 NOTE — Telephone Encounter (Signed)
i did

## 2014-06-28 ENCOUNTER — Ambulatory Visit (INDEPENDENT_AMBULATORY_CARE_PROVIDER_SITE_OTHER): Payer: BLUE CROSS/BLUE SHIELD | Admitting: Internal Medicine

## 2014-06-28 ENCOUNTER — Encounter: Payer: Self-pay | Admitting: Internal Medicine

## 2014-06-28 VITALS — BP 128/82 | HR 79 | Temp 97.9°F | Resp 14 | Ht 62.0 in | Wt 181.0 lb

## 2014-06-28 DIAGNOSIS — E785 Hyperlipidemia, unspecified: Secondary | ICD-10-CM

## 2014-06-28 DIAGNOSIS — Z23 Encounter for immunization: Secondary | ICD-10-CM | POA: Diagnosis not present

## 2014-06-28 DIAGNOSIS — E119 Type 2 diabetes mellitus without complications: Secondary | ICD-10-CM | POA: Diagnosis not present

## 2014-06-28 DIAGNOSIS — I10 Essential (primary) hypertension: Secondary | ICD-10-CM | POA: Diagnosis not present

## 2014-06-28 NOTE — Progress Notes (Signed)
Patient ID: Julia Pierce, female   DOB: 24-Mar-1956, 59 y.o.   MRN: [id number]   Patient Active Problem List   Diagnosis Date Noted  . ETD (eustachian tube dysfunction) 04/25/2014  . History of shingles 12/07/2013  . S/P placement of cardiac pacemaker 12/07/2013  . Chronic systolic heart failure 02/12/8526  . Syncope and collapse 05/31/2013  . Syncope 05/31/2013  . Cervical stenosis of spine 05/11/2013  . Erythema migrans (Lyme disease) 12/06/2012  . Tick bite of hip 09/13/2012  . Statin intolerance 03/03/2012  . Reflux esophagitis 12/31/2011  . Diabetes mellitus type 2, diet-controlled 09/27/2011  . Obesity (BMI 30-39.9) 05/12/2011  . Morton's neuroma   . Valvular cardiomyopathy 03/31/2011  . Hyperlipidemia with target LDL less than 100 03/31/2011  . Long QT syndrome 02/05/2011  . Hypertension   . Depression   . Holter monitor, abnormal     Subjective:  CC:   Chief Complaint  Patient presents with  . Follow-up  . Diabetes    HPI:   Julia Pierce is a 59 y.o. female who presents for  Diabetes follow up.  She has been eating poorly since October,  Due to stress,  SSRI, and spending considerable amount of time in hospital or away from hoe due to new granddaughter being so ill.  Cites sertraline as a contributor and well meaning family friend.  Not exercising.  Her granddaughter has a feeding tube and has been declining since birth  due to eosinophilic gastritisi recently diagnosed at the age of 83 months   She has not been swimming or walking.  Thinks her mortons neuroma is flaring up,  Having pain up her shin .  Sees podiatrist Matt Troxler but no injections done  Due to concurrent nerve damage from back surgery.   Recent fasting labs reviewed in detail with patient today.   Lab Results  Component Value Date   HGBA1C 7.0* 06/26/2014   Lab Results  Component Value Date   MICROALBUR <0.7 06/26/2014   Lab Results  Component Value Date   CHOL 226* 06/26/2014   HDL  39.50 06/26/2014   LDLCALC 88 08/17/2013   LDLDIRECT 127.0 06/26/2014   TRIG * 06/26/2014    482.0 Triglyceride is over 400; calculations on Lipids are invalid.   CHOLHDL 6 06/26/2014   Lab Results  Component Value Date   TSH 3.11 05/09/2013   Wt Readings from Last 3 Encounters:  06/28/14 181 lb (82.101 kg)  04/25/14 180 lb (81.647 kg)  03/28/14 178 lb 12 oz (81.08 kg)     Past Medical History  Diagnosis Date  . Arthritis   . Chicken pox   . Headache, frequent episodic tension-type   . Cardiac arrhythmia due to congenital heart disease   . History of high blood pressure     readings  . High cholesterol   . Migraines   . Hx of colonoscopy sept 2012    normal,  next due 2022, [name]  . Hypertension   . Depression   . Holter monitor, abnormal August 2012    done for long QT.  some contractile asynchrony [name])  . Morton's neuroma     bilateral  . Syncope     Past Surgical History  Procedure Laterality Date  . Appendectomy  1973  . Tonsillectomy and adenoidectomy  1966  . Spinal fusion  03/2007    ruptured disc  L5  [name]  . Hysteroscopy  2005    heavy bleeding  . Abdominal hysterectomy  Dec 2013    [facility]  . Bi-ventricular pacemaker insertion (crt-p)  05/2013    MDT CRTP implanted by Dr Lovena Le for cardiomyopathy, LBBB, and syncope  . Implantable cardioverter defibrillator implant N/A 06/02/2013    Procedure: IMPLANTABLE CARDIOVERTER DEFIBRILLATOR IMPLANT;  Surgeon: Evans Lance, MD;  Location: [facility] CATH LAB;  Service: Cardiovascular;  Laterality: N/A;       The following portions of the patient's history were reviewed and updated as appropriate: Allergies, current medications, and problem list.    Review of Systems:   Patient denies headache, fevers, malaise, unintentional weight loss, skin rash, eye pain, sinus congestion and sinus pain, sore throat, dysphagia,  hemoptysis , cough, dyspnea, wheezing, chest pain, palpitations, orthopnea, edema,  abdominal pain, nausea, melena, diarrhea, constipation, flank pain, dysuria, hematuria, urinary  Frequency, nocturia, numbness, tingling, seizures,  Focal weakness, Loss of consciousness,  Tremor, insomnia, depression, anxiety, and suicidal ideation.     History   Social History  . Marital Status: Married    Spouse Name: N/A  . Number of Children: N/A  . Years of Education: N/A   Occupational History  . Not on file.   Social History Main Topics  . Smoking status: Never Smoker   . Smokeless tobacco: Never Used  . Alcohol Use: No  . Drug Use: No  . Sexual Activity: Not on file   Other Topics Concern  . Not on file   Social History Narrative    Objective:  Filed Vitals:   06/28/14 0949  BP: 128/82  Pulse: 79  Temp: 97.9 F (36.6 C)  Resp: 14     General appearance: alert, cooperative and appears stated age Ears: normal TM's and external ear canals both ears Throat: lips, mucosa, and tongue normal; teeth and gums normal Neck: no adenopathy, no carotid bruit, supple, symmetrical, trachea midline and thyroid not enlarged, symmetric, no tenderness/mass/nodules Back: symmetric, no curvature. ROM normal. No CVA tenderness. Lungs: clear to auscultation bilaterally Heart: regular rate and rhythm, S1, S2 normal, no murmur, click, rub or gallop Abdomen: soft, non-tender; bowel sounds normal; no masses,  no organomegaly Pulses: 2+ and symmetric Skin: Skin color, texture, turgor normal. No rashes or lesions Lymph nodes: Cervical, supraclavicular, and axillary nodes normal.  Assessment and Plan:  Hypertension Well controlled on current regimen. Renal function stable, no changes today.  Lab Results  Component Value Date   CREATININE 0.78 06/26/2014   Lab Results  Component Value Date   NA 139 06/26/2014   K 4.3 06/26/2014   CL 103 06/26/2014   CO2 27 06/26/2014      Diabetes mellitus type 2, diet-controlled Well-controlled on diet histoically, but hemoglobin A1c  has risen slightly due to dietary noncompliance. .I have addressed  BMI and recommended wt loss of 10% of body weigh over the next 6 months using a low glycemic index diet and regular exercise a minimum of 5 days per week.   her foot exam is normal today.   Lab Results  Component Value Date   HGBA1C 7.0* 06/26/2014   Lab Results  Component Value Date   MICROALBUR <0.7 06/26/2014              Hyperlipidemia with target LDL less than 100 LDL has improved with fish oil,  But triglycerides are over 30 due to diet.   Recommended a low glycemic index diet, weight  loss and regular exercise.  Advised t o  repeat in 3 months, will add fenofibrate if still > 300  Lab Results  Component Value Date   CHOL 223* 05/17/2014   HDL 52.20 05/17/2014   LDLDIRECT 109.0 05/17/2014   TRIG 343.0* 05/17/2014   CHOLHDL 4 05/17/2014   Lab Results  Component Value Date   ALT 20 05/17/2014   AST 24 05/17/2014   ALKPHOS 73 05/17/2014   BILITOT 0.4 05/17/2014          Updated Medication List Outpatient Encounter Prescriptions as of 06/28/2014  Medication Sig  . aspirin EC 81 MG tablet Take 81 mg by mouth daily.  . Azelaic Acid (FINACEA EX) Apply 1 application topically 2 (two) times daily.  . B Complex Vitamins (B COMPLEX PO) Take 1 tablet by mouth daily.   . calcium carbonate (OS-CAL) 600 MG TABS Take 600 mg by mouth daily.    . Cholecalciferol (VITAMIN D3) 2000 UNITS TABS Take 1 tablet by mouth daily.  Marland Kitchen esomeprazole (NEXIUM) 20 MG capsule Take 40 mg by mouth daily at 12 noon.  . fluticasone (FLONASE) 50 MCG/ACT nasal spray Place 2 sprays into both nostrils daily.  Marland Kitchen HYDROcodone-acetaminophen (NORCO) 5-325 MG per tablet Take 1 tablet by mouth every 6 (six) hours as needed for moderate pain.   Marland Kitchen losartan-hydrochlorothiazide (HYZAAR) 50-12.5 MG per tablet TAKE 1 TABLET BY MOUTH DAILY.  . methocarbamol (ROBAXIN) 500 MG tablet Take 1 tablet (500 mg total) by mouth every 8 (eight) hours as  needed for muscle spasms.  . metoprolol tartrate (LOPRESSOR) 25 MG tablet TAKE 1/2 TABLET BY MOUTH TWICE A DAY  . Multiple Vitamin (MULTIVITAMIN) tablet Take 1 tablet by mouth daily.    . Omega-3 Fatty Acids (FISH OIL) 1000 MG CAPS Take 1 capsule by mouth 2 (two) times daily.  . sertraline (ZOLOFT) 100 MG tablet TAKE 1 TABLET BY MOUTH DAILY  . spironolactone (ALDACTONE) 25 MG tablet Take 25 mg by mouth daily.  Marland Kitchen tolterodine (DETROL) 2 MG tablet Take 2 mg by mouth 2 (two) times daily.  . [DISCONTINUED] diphenhydrAMINE (BENADRYL) 25 MG tablet Take 25 mg by mouth at bedtime as needed for sleep.      Orders Placed This Encounter  Procedures  . Pneumococcal conjugate vaccine 13-valent    Return in about 3 months (around 09/28/2014).

## 2014-06-28 NOTE — Patient Instructions (Signed)
All of your labs are normal, and your LDL has improved,  But your triglycerides are over 400,  which is raising your total cholesterol .  This should improve with adherence to a low glycemic index diet and resuming regular exercise .  We should repeat in 3 months.   Continue the 2 fish oil capsules daily    Ok to try daily turmeric,  It is a natural anti inflammatory

## 2014-06-28 NOTE — Progress Notes (Signed)
Pre-visit discussion using our clinic review tool. No additional management support is needed unless otherwise documented below in the visit note.  

## 2014-07-01 ENCOUNTER — Encounter: Payer: Self-pay | Admitting: Internal Medicine

## 2014-07-01 NOTE — Assessment & Plan Note (Signed)
Well controlled on current regimen. Renal function stable, no changes today.  Lab Results  Component Value Date   CREATININE 0.78 06/26/2014   Lab Results  Component Value Date   NA 139 06/26/2014   K 4.3 06/26/2014   CL 103 06/26/2014   CO2 27 06/26/2014

## 2014-07-01 NOTE — Assessment & Plan Note (Signed)
Well-controlled on diet histoically, but hemoglobin A1c has risen slightly due to dietary noncompliance. .I have addressed  BMI and recommended wt loss of 10% of body weigh over the next 6 months using a low glycemic index diet and regular exercise a minimum of 5 days per week.   her foot exam is normal today.   Lab Results  Component Value Date   HGBA1C 7.0* 06/26/2014   Lab Results  Component Value Date   MICROALBUR <0.7 06/26/2014

## 2014-07-01 NOTE — Assessment & Plan Note (Signed)
LDL has improved with fish oil,  But triglycerides are over 30 due to diet.   Recommended a low glycemic index diet, weight  loss and regular exercise.  Advised t o  repeat in 3 months, will add fenofibrate if still > 300  Lab Results  Component Value Date   CHOL 223* 05/17/2014   HDL 52.20 05/17/2014   LDLDIRECT 109.0 05/17/2014   TRIG 343.0* 05/17/2014   CHOLHDL 4 05/17/2014   Lab Results  Component Value Date   ALT 20 05/17/2014   AST 24 05/17/2014   ALKPHOS 73 05/17/2014   BILITOT 0.4 05/17/2014

## 2014-07-05 ENCOUNTER — Ambulatory Visit: Payer: Self-pay | Admitting: Pain Medicine

## 2014-07-05 DIAGNOSIS — M25552 Pain in left hip: Secondary | ICD-10-CM | POA: Diagnosis not present

## 2014-07-05 DIAGNOSIS — R7309 Other abnormal glucose: Secondary | ICD-10-CM | POA: Diagnosis not present

## 2014-07-05 DIAGNOSIS — M25551 Pain in right hip: Secondary | ICD-10-CM | POA: Diagnosis not present

## 2014-07-05 DIAGNOSIS — M79605 Pain in left leg: Secondary | ICD-10-CM | POA: Diagnosis not present

## 2014-07-05 DIAGNOSIS — M542 Cervicalgia: Secondary | ICD-10-CM | POA: Diagnosis not present

## 2014-07-05 DIAGNOSIS — I10 Essential (primary) hypertension: Secondary | ICD-10-CM | POA: Diagnosis not present

## 2014-07-05 DIAGNOSIS — Z950 Presence of cardiac pacemaker: Secondary | ICD-10-CM | POA: Diagnosis not present

## 2014-07-06 ENCOUNTER — Encounter: Payer: Self-pay | Admitting: Cardiology

## 2014-07-12 ENCOUNTER — Encounter: Payer: Self-pay | Admitting: Internal Medicine

## 2014-07-20 ENCOUNTER — Encounter: Payer: Self-pay | Admitting: Internal Medicine

## 2014-07-31 ENCOUNTER — Encounter: Payer: Self-pay | Admitting: Internal Medicine

## 2014-07-31 ENCOUNTER — Ambulatory Visit (INDEPENDENT_AMBULATORY_CARE_PROVIDER_SITE_OTHER): Payer: BLUE CROSS/BLUE SHIELD | Admitting: Internal Medicine

## 2014-07-31 VITALS — BP 136/84 | HR 83 | Temp 97.9°F | Resp 16 | Ht 62.0 in | Wt 178.8 lb

## 2014-07-31 DIAGNOSIS — M1000 Idiopathic gout, unspecified site: Secondary | ICD-10-CM

## 2014-07-31 DIAGNOSIS — M109 Gout, unspecified: Secondary | ICD-10-CM

## 2014-07-31 MED ORDER — COLCHICINE 0.6 MG PO TABS: 0.6000 mg | ORAL_TABLET | Freq: Two times a day (BID) | ORAL | Status: DC

## 2014-07-31 NOTE — Progress Notes (Signed)
Patient ID: Julia Pierce, female   DOB: [birth year], 59 y.o.   MRN: [id number]  Patient Active Problem List   Diagnosis Date Noted  . Gout of big toe 08/02/2014  . ETD (eustachian tube dysfunction) 04/25/2014  . History of shingles 12/07/2013  . S/P placement of cardiac pacemaker 12/07/2013  . Chronic systolic heart failure 97/05/6376  . Syncope and collapse 05/31/2013  . Syncope 05/31/2013  . Cervical stenosis of spine 05/11/2013  . Erythema migrans (Lyme disease) 12/06/2012  . Tick bite of hip 09/13/2012  . Statin intolerance 03/03/2012  . Reflux esophagitis 12/31/2011  . Diabetes mellitus type 2, diet-controlled 09/27/2011  . Obesity (BMI 30-39.9) 05/12/2011  . Morton's neuroma   . Valvular cardiomyopathy 03/31/2011  . Hyperlipidemia with target LDL less than 100 03/31/2011  . Long QT syndrome 02/05/2011  . Hypertension   . Depression   . Holter monitor, abnormal     Subjective:  CC:   Chief Complaint  Patient presents with  . Toe Pain    right great toe red swollen patient believes it is gout.    HPI:   Julia Pierce is a 59 y.o. female who presents for evaluation of Right great toe pain accompanied by erythema and edema most pronounced at the MT . started 5 days ago with redness and swelling ,  Became  aggravated by swimming and activity .  Has been elevating it and taking ibuprofen for the past several days with improvement in symptoms.  No history of prior gout attacks.  Was celeberating birthday with maragaritas and pizza     Past Medical History  Diagnosis Date  . Arthritis   . Chicken pox   . Headache, frequent episodic tension-type   . Cardiac arrhythmia due to congenital heart disease   . History of high blood pressure     readings  . High cholesterol   . Migraines   . Hx of colonoscopy sept 2012    normal,  next due 2022, [name]  . Hypertension   . Depression   . Holter monitor, abnormal August 2012    done for long QT.  some contractile  asynchrony [name])  . Morton's neuroma     bilateral  . Syncope     Past Surgical History  Procedure Laterality Date  . Appendectomy  1973  . Tonsillectomy and adenoidectomy  1966  . Spinal fusion  03/2007    ruptured disc  L5  [name]  . Hysteroscopy  2005    heavy bleeding  . Abdominal hysterectomy  Dec 2013    [place]  . Bi-ventricular pacemaker insertion (crt-p)  05/2013    MDT CRTP implanted by Dr Lovena Le for cardiomyopathy, LBBB, and syncope  . Implantable cardioverter defibrillator implant N/A 06/02/2013    Procedure: IMPLANTABLE CARDIOVERTER DEFIBRILLATOR IMPLANT;  Surgeon: Evans Lance, MD;  Location: [facility] CATH LAB;  Service: Cardiovascular;  Laterality: N/A;       The following portions of the patient's history were reviewed and updated as appropriate: Allergies, current medications, and problem list.    Review of Systems:   Patient denies headache, fevers, malaise, unintentional weight loss, skin rash, eye pain, sinus congestion and sinus pain, sore throat, dysphagia,  hemoptysis , cough, dyspnea, wheezing, chest pain, palpitations, orthopnea, edema, abdominal pain, nausea, melena, diarrhea, constipation, flank pain, dysuria, hematuria, urinary  Frequency, nocturia, numbness, tingling, seizures,  Focal weakness, Loss of consciousness,  Tremor, insomnia, depression, anxiety, and suicidal ideation.     History  Social History  . Marital Status: Married    Spouse Name: N/A  . Number of Children: N/A  . Years of Education: N/A   Occupational History  . Not on file.   Social History Main Topics  . Smoking status: Never Smoker   . Smokeless tobacco: Never Used  . Alcohol Use: No  . Drug Use: No  . Sexual Activity: Not on file   Other Topics Concern  . Not on file   Social History Narrative    Objective:  Filed Vitals:   07/31/14 1741  BP: 136/84  Pulse: 83  Temp: 97.9 F (36.6 C)  Resp: 16     General appearance: alert, cooperative and  appears stated age Heart: regular rate and rhythm, S1, S2 normal, no murmur, click, rub or gallop Abdomen: soft, non-tender; bowel sounds normal; no masses,  no organomegaly Pulses: 2+ and symmetric Skin: Skin color, texture, turgor normal. No rashes or lesions MSK:  Right great toe with blanching erythema,edema, tender at MT joint.  No skin breakage.   Lymph nodes: Cervical, supraclavicular, and axillary nodes normal.  Assessment and Plan:  Gout of big toe Presumed ,  By history and exan,  Colchicine 0.6 mg bid and tylenol. If no improvement , plain films and labs to rule out fracture, sepsis    Updated Medication List Outpatient Encounter Prescriptions as of 07/31/2014  Medication Sig  . aspirin EC 81 MG tablet Take 81 mg by mouth daily.  . Azelaic Acid (FINACEA EX) Apply 1 application topically 2 (two) times daily.  . B Complex Vitamins (B COMPLEX PO) Take 1 tablet by mouth daily.   . calcium carbonate (OS-CAL) 600 MG TABS Take 600 mg by mouth daily.    . Cholecalciferol (VITAMIN D3) 2000 UNITS TABS Take 1 tablet by mouth daily.  . colchicine 0.6 MG tablet Take 1 tablet (0.6 mg total) by mouth 2 (two) times daily.  Marland Kitchen esomeprazole (NEXIUM) 20 MG capsule Take 40 mg by mouth daily at 12 noon.  . fluticasone (FLONASE) 50 MCG/ACT nasal spray Place 2 sprays into both nostrils daily.  Marland Kitchen HYDROcodone-acetaminophen (NORCO) 5-325 MG per tablet Take 1 tablet by mouth every 6 (six) hours as needed for moderate pain.   Marland Kitchen losartan-hydrochlorothiazide (HYZAAR) 50-12.5 MG per tablet TAKE 1 TABLET BY MOUTH DAILY.  . methocarbamol (ROBAXIN) 500 MG tablet Take 1 tablet (500 mg total) by mouth every 8 (eight) hours as needed for muscle spasms.  . metoprolol tartrate (LOPRESSOR) 25 MG tablet TAKE 1/2 TABLET BY MOUTH TWICE A DAY  . Multiple Vitamin (MULTIVITAMIN) tablet Take 1 tablet by mouth daily.    . Omega-3 Fatty Acids (FISH OIL) 1000 MG CAPS Take 1 capsule by mouth 2 (two) times daily.  . sertraline  (ZOLOFT) 100 MG tablet TAKE 1 TABLET BY MOUTH DAILY  . spironolactone (ALDACTONE) 25 MG tablet Take 25 mg by mouth daily.  Marland Kitchen tolterodine (DETROL) 2 MG tablet Take 2 mg by mouth 2 (two) times daily.     No orders of the defined types were placed in this encounter.    No Follow-up on file.

## 2014-07-31 NOTE — Progress Notes (Signed)
Pre-visit discussion using our clinic review tool. No additional management support is needed unless otherwise documented below in the visit note.  

## 2014-07-31 NOTE — Patient Instructions (Signed)
I am prescribingcolchicine to take twice daily instead of advil.    You can add hydrocodone every 6 hours as needed for pain   Gout Gout is an inflammatory arthritis caused by a buildup of uric acid crystals in the joints. Uric acid is a chemical that is normally present in the blood. When the level of uric acid in the blood is too high it can form crystals that deposit in your joints and tissues. This causes joint redness, soreness, and swelling (inflammation). Repeat attacks are common. Over time, uric acid crystals can form into masses (tophi) near a joint, destroying bone and causing disfigurement. Gout is treatable and often preventable. CAUSES  The disease begins with elevated levels of uric acid in the blood. Uric acid is produced by your body when it breaks down a naturally found substance called purines. Certain foods you eat, such as meats and fish, contain high amounts of purines. Causes of an elevated uric acid level include:  Being passed down from parent to child (heredity).  Diseases that cause increased uric acid production (such as obesity, psoriasis, and certain cancers).  Excessive alcohol use.  Diet, especially diets rich in meat and seafood.  Medicines, including certain cancer-fighting medicines (chemotherapy), water pills (diuretics), and aspirin.  Chronic kidney disease. The kidneys are no longer able to remove uric acid well.  Problems with metabolism. Conditions strongly associated with gout include:  Obesity.  High blood pressure.  High cholesterol.  Diabetes. Not everyone with elevated uric acid levels gets gout. It is not understood why some people get gout and others do not. Surgery, joint injury, and eating too much of certain foods are some of the factors that can lead to gout attacks. SYMPTOMS   An attack of gout comes on quickly. It causes intense pain with redness, swelling, and warmth in a joint.  Fever can occur.  Often, only one joint is  involved. Certain joints are more commonly involved:  Base of the big toe.  Knee.  Ankle.  Wrist.  Finger. Without treatment, an attack usually goes away in a few days to weeks. Between attacks, you usually will not have symptoms, which is different from many other forms of arthritis. DIAGNOSIS  Your caregiver will suspect gout based on your symptoms and exam. In some cases, tests may be recommended. The tests may include:  Blood tests.  Urine tests.  X-rays.  Joint fluid exam. This exam requires a needle to remove fluid from the joint (arthrocentesis). Using a microscope, gout is confirmed when uric acid crystals are seen in the joint fluid. TREATMENT  There are two phases to gout treatment: treating the sudden onset (acute) attack and preventing attacks (prophylaxis).  Treatment of an Acute Attack.  Medicines are used. These include anti-inflammatory medicines or steroid medicines.  An injection of steroid medicine into the affected joint is sometimes necessary.  The painful joint is rested. Movement can worsen the arthritis.  You may use warm or cold treatments on painful joints, depending which works best for you.  Treatment to Prevent Attacks.  If you suffer from frequent gout attacks, your caregiver may advise preventive medicine. These medicines are started after the acute attack subsides. These medicines either help your kidneys eliminate uric acid from your body or decrease your uric acid production. You may need to stay on these medicines for a very long time.  The early phase of treatment with preventive medicine can be associated with an increase in acute gout attacks. For this  reason, during the first few months of treatment, your caregiver may also advise you to take medicines usually used for acute gout treatment. Be sure you understand your caregiver's directions. Your caregiver may make several adjustments to your medicine dose before these medicines are  effective.  Discuss dietary treatment with your caregiver or dietitian. Alcohol and drinks high in sugar and fructose and foods such as meat, poultry, and seafood can increase uric acid levels. Your caregiver or dietitian can advise you on drinks and foods that should be limited. HOME CARE INSTRUCTIONS   Do not take aspirin to relieve pain. This raises uric acid levels.  Only take over-the-counter or prescription medicines for pain, discomfort, or fever as directed by your caregiver.  Rest the joint as much as possible. When in bed, keep sheets and blankets off painful areas.  Keep the affected joint raised (elevated).  Apply warm or cold treatments to painful joints. Use of warm or cold treatments depends on which works best for you.  Use crutches if the painful joint is in your leg.  Drink enough fluids to keep your urine clear or pale yellow. This helps your body get rid of uric acid. Limit alcohol, sugary drinks, and fructose drinks.  Follow your dietary instructions. Pay careful attention to the amount of protein you eat. Your daily diet should emphasize fruits, vegetables, whole grains, and fat-free or low-fat milk products. Discuss the use of coffee, vitamin C, and cherries with your caregiver or dietitian. These may be helpful in lowering uric acid levels.  Maintain a healthy body weight. SEEK MEDICAL CARE IF:   You develop diarrhea, vomiting, or any side effects from medicines.  You do not feel better in 24 hours, or you are getting worse. SEEK IMMEDIATE MEDICAL CARE IF:   Your joint becomes suddenly more tender, and you have chills or a fever. MAKE SURE YOU:   Understand these instructions.  Will watch your condition.  Will get help right away if you are not doing well or get worse. Document Released: 04/04/2000 Document Revised: 08/22/2013 Document Reviewed: 11/19/2011 [facility] Patient Information 2015 [place], [place]. This information is not intended to replace  advice given to you by your health care provider. Make sure you discuss any questions you have with your health care provider.

## 2014-08-02 DIAGNOSIS — M109 Gout, unspecified: Secondary | ICD-10-CM | POA: Insufficient documentation

## 2014-08-02 NOTE — Addendum Note (Signed)
Addended by: Crecencio Mc on: 08/02/2014 08:24 PM   Modules accepted: Level of Service

## 2014-08-02 NOTE — Assessment & Plan Note (Signed)
Presumed ,  By history and exan,  Colchicine 0.6 mg bid and tylenol. If no improvement , plain films and labs to rule out fracture, sepsis

## 2014-08-03 ENCOUNTER — Encounter: Payer: Self-pay | Admitting: Internal Medicine

## 2014-08-03 ENCOUNTER — Ambulatory Visit
Admit: 2014-08-03 | Disposition: A | Discharge status: Home / Self Care | Payer: Self-pay | Attending: Pain Medicine | Admitting: Pain Medicine

## 2014-08-03 DIAGNOSIS — M109 Gout, unspecified: Secondary | ICD-10-CM | POA: Diagnosis not present

## 2014-08-03 DIAGNOSIS — M47896 Other spondylosis, lumbar region: Secondary | ICD-10-CM | POA: Diagnosis not present

## 2014-08-03 DIAGNOSIS — M47892 Other spondylosis, cervical region: Secondary | ICD-10-CM | POA: Diagnosis not present

## 2014-08-08 NOTE — Op Note (Signed)
PATIENT NAME:  Julia Pierce, Julia Pierce MR#:  [id number] DATE OF BIRTH:  1955-09-07  DATE OF PROCEDURE:  03/25/2012  PREOPERATIVE DIAGNOSIS: Uterine prolapse with stress urinary incontinence by urodynamics.   POSTOPERATIVE DIAGNOSIS: Uterine prolapse with stress urinary incontinence by urodynamics.   PROCEDURES: Total vaginal hysterectomy, bilateral salpingectomy, McCall's culdoplasty, and TVT sling placement.   ESTIMATED BLOOD LOSS: 50 mL.   FINDINGS: Prolapse of the uterus to the introitus with cystocele and genuine stress urinary incontinence.  SURGEON: Delsa Sale, M.D.   ASSISTANT: Prentice Docker, MD   DESCRIPTION OF PROCEDURE: The patient was taken to the Operating Room and placed in the supine position. After adequate general endotracheal anesthesia was instilled, the patient was prepped and draped in the usual sterile fashion and placed in candy-cane stirrups. A time out was performed and weighted speculum was placed in the patient's vagina. The cervix was grasped with Lahey clamps and the uterus uterosacrals were massaged (inaudible) were massaged. Bimanual exam was performed. Lidocaine was injected circumferentially around the cervix. The posterior cul-de-sac was identified, grasped with a pick-up, and curved Mayo scissors were used to enter into the posterior cul-de-sac. The peritoneum was identified and the peritoneum was stitched to the posterior vaginal mucosa. A knife was then used to circumferentially circumscribe around the uterus and cervix. Sharp and blunt dissection was used to push the bladder off of the cervix and uterus. Heaney clamps were used to grasp the uterosacral ligaments bilaterally. These were clamped, cut, and suture tied. The cardinal ligaments were then grasped, clamped, cut, and suture-tied. The blade  (inaudible) of the retractor was placed into the anterior abdomen and the bladder were dissected off. Suture was used to approximate the peritoneum to the anterior  vaginal mucosa. the oterosacrals  inaudible) were grasped, cut, and suture-ligated up to the top of the utero-ovarian ligaments and tubes bilaterally. The uterus was then removed and passed off the table. Babcock a minilap (inaudible) long lap was placed into the abdomen and the Babcock clamp was used to grasp the tube, first on the right, which was then dissected off with the Bovie, from the peritoneum, and then at the junction to the ovary clamped, cut, and suture ligated and removed from the body. In a similar fashion, the left tube was grasped with Babcock clamp.  Babcock clamp was placed across the peritoneum between the tube and the ovary. This was clamped, cut, and suture ligated. Good hemostasis was identified. The ovaries were seen and were very small and were allowed to pass up into the abdomen. The long lap was removed and the midline sutures were tied together after a McCall's culdoplasty was performed to approximate the uterosacrals and to obliterate the dead space, in the posterior cul-de-sac. Multiple sutures were then placed horizontally across the vaginal mucosa, which was found to be hemostatic after the last suture was placed. At this point, the peritoneum was found to be very well pulled up. Attention was then turned to the bladder where the Foley catheter was removed and the bulbous position and was found to be at the neck of the bladder. An incision was cut in the vaginal mucosa approximately 2 cm in length below the urethral opening The vaginal mucosa and bladder were dissected from each other out to the pubic symphysis. Trocar placements were identified on the mons. The TVT sling was then loaded and first the right and then the left trocar was placed. The sleeves were pulled up. The patient was placed in reverse Trendelenburg. A #  8 Pratt's dilator was placed between the bladder and the sling to allow room for shrinkage. The cystoscopy was then performed. No tape was seen in the bladder. No  injury was seen to the bladder. The sleeves were then removed from the mesh. The ends were cut. The vaginal mucosa was closed. Pressure on the bladder showed no release of urine from the urethra. The vaginal mucosa was closed with a running 2-0 Monocryl suture. The Foley catheter was placed back into the bladder. Dermabond was placed on the trocar port sites. The patient was laid supine and taken to recovery after having tolerated the procedure well. ____________________________ Delsa Sale, MD cck:slb D: 03/29/2012 09:55:36 ET T: 03/29/2012 10:47:21 ET JOB#: [id number]  cc: Delsa Sale, MD, <Dictator> Delsa Sale MD ELECTRONICALLY SIGNED 04/01/2012 9:34

## 2014-08-10 ENCOUNTER — Other Ambulatory Visit: Payer: Self-pay | Admitting: Internal Medicine

## 2014-09-05 ENCOUNTER — Ambulatory Visit: Payer: BLUE CROSS/BLUE SHIELD | Attending: Pain Medicine | Admitting: Pain Medicine

## 2014-09-05 ENCOUNTER — Encounter: Payer: Self-pay | Admitting: Pain Medicine

## 2014-09-05 VITALS — BP 122/70 | HR 74 | Temp 98.7°F | Resp 16 | Ht 62.0 in | Wt 178.0 lb

## 2014-09-05 DIAGNOSIS — M5030 Other cervical disc degeneration, unspecified cervical region: Secondary | ICD-10-CM

## 2014-09-05 DIAGNOSIS — M47812 Spondylosis without myelopathy or radiculopathy, cervical region: Secondary | ICD-10-CM

## 2014-09-05 DIAGNOSIS — M542 Cervicalgia: Secondary | ICD-10-CM | POA: Diagnosis present

## 2014-09-05 DIAGNOSIS — M47816 Spondylosis without myelopathy or radiculopathy, lumbar region: Secondary | ICD-10-CM

## 2014-09-05 DIAGNOSIS — M533 Sacrococcygeal disorders, not elsewhere classified: Secondary | ICD-10-CM

## 2014-09-05 DIAGNOSIS — M1288 Other specific arthropathies, not elsewhere classified, other specified site: Secondary | ICD-10-CM | POA: Diagnosis not present

## 2014-09-05 DIAGNOSIS — M5136 Other intervertebral disc degeneration, lumbar region: Secondary | ICD-10-CM | POA: Insufficient documentation

## 2014-09-05 DIAGNOSIS — M5481 Occipital neuralgia: Secondary | ICD-10-CM

## 2014-09-05 DIAGNOSIS — M109 Gout, unspecified: Secondary | ICD-10-CM | POA: Diagnosis not present

## 2014-09-05 DIAGNOSIS — R51 Headache: Secondary | ICD-10-CM | POA: Diagnosis present

## 2014-09-05 DIAGNOSIS — M4802 Spinal stenosis, cervical region: Secondary | ICD-10-CM | POA: Diagnosis not present

## 2014-09-05 DIAGNOSIS — M549 Dorsalgia, unspecified: Secondary | ICD-10-CM | POA: Diagnosis present

## 2014-09-05 DIAGNOSIS — M7062 Trochanteric bursitis, left hip: Secondary | ICD-10-CM

## 2014-09-05 DIAGNOSIS — M7061 Trochanteric bursitis, right hip: Secondary | ICD-10-CM

## 2014-09-05 MED ORDER — HYDROCODONE-ACETAMINOPHEN 5-325 MG PO TABS: ORAL_TABLET | ORAL | Status: DC

## 2014-09-05 NOTE — Progress Notes (Signed)
   Subjective:    Patient ID: Julia Pierce, female    DOB: 10/07/1955, 59 y.o.   MRN: [id number]  HPI  Patient is 59 year old female returns to pain management for further evaluation and treatment of pain involving the neck and entire back upper and lower extremity regions with recent exacerbation of gout of the great toe of the right foot. Patient states that she has resumed swimming since the galloped has been well controlled and that she is with decreased pain involving the region of the lower back lower extremity region with some persistent pain occurring in the region of the neck shoulders and headaches. We discussed interventional treatment at the present time patient will continue swimming and will continue her massage therapy and we will consider interventional treatment pending response to the present treatment regimen. I'll understanding and in agreement with suggested treatment plan.  Review of Systems     Objective:   Physical Exam  There was tenderness of the splenius capitis and occipitalis muscles of moderate degree especially on the left. Palpation of the cervical facets reproduce moderate discomfort grip strength appeared to be slightly decreased with Tinel's and Phalen's maneuver reproducing minimal discomfort. Outpatient over the thoracic paraspinal musculature region reproduced pain of moderate degree. No crepitus of the thoracic region noted palpation of the lumbar paraspinal muscles reproduced pain of moderate degree with straight leg raising tolerates approximately 30 without increase of pain with dorsiflexion noted. No definite sensory deficit of dermatomal distribution detected. Mild to moderate tenderness of the greater trochanteric region iliotibial band region. There was a slight erythematous appearance of the right great toe without excessive tends to palpation. 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 He 55 right central bulging versus spondylosis at C5-6 central and left bulging disc with left foraminal stenosis C7-T1  Degenerative disc disease of the lumbar spine in studies reveal patient to be with prior spinal fusion and decompression L5-S1 postsurgical changes and posterior spinal soft tissues and facet arthropathy noted throughout the lumbar spine L3-4, L4-L5, and L5-S1.  Occipital neuralgia  Gout of lower extremity. Presently controlled  Plan  Continue present medications. Hydrocodone acetaminophen  Continue swimming and massage therapy  F/U PCP for evaliation of  BP and general medical  condition.  F/U surgical evaluation.  F/U neurological evaluation.  May consider radiofrequency rhizolysis or intraspinal procedures pending response to present treatment and F/U evaluation.  Patient to call [facility] Center should patient have concerns prior to scheduled return appointment.

## 2014-09-05 NOTE — Progress Notes (Signed)
Discharge patient home ambulatory at 1350 hrs Teach back 3 done Script given for hydrocodone 5-325mg  Return in one month

## 2014-09-05 NOTE — Progress Notes (Signed)
   Subjective:    Patient ID: Julia Pierce, female    DOB: 04-03-1956, 59 y.o.   MRN: [id number]  HPI    Review of Systems     Objective:   Physical Exam        Assessment & Plan:

## 2014-09-05 NOTE — Patient Instructions (Addendum)
Continue present medication. Hydrocodone acetaminophen  Continue swimming as well as massage therapy as discussed. We will consider interventional treatment if needed  F/U PCP for evaliation of  BP and general medical  condition.  F/U surgical evaluation.  F/U neurological evaluation.  May consider radiofrequency rhizolysis or intraspinal procedures pending response to present treatment and F/U evaluation.  Patient to call [facility] Management Center should patient have concerns prior to scheduled return appointment.

## 2014-09-06 ENCOUNTER — Encounter: Payer: Medicare Other | Admitting: Pain Medicine

## 2014-09-06 ENCOUNTER — Other Ambulatory Visit: Payer: Self-pay | Admitting: Internal Medicine

## 2014-09-25 ENCOUNTER — Other Ambulatory Visit (INDEPENDENT_AMBULATORY_CARE_PROVIDER_SITE_OTHER): Payer: BLUE CROSS/BLUE SHIELD

## 2014-09-25 ENCOUNTER — Telehealth: Payer: Self-pay | Admitting: *Deleted

## 2014-09-25 ENCOUNTER — Ambulatory Visit (INDEPENDENT_AMBULATORY_CARE_PROVIDER_SITE_OTHER): Payer: BLUE CROSS/BLUE SHIELD | Admitting: *Deleted

## 2014-09-25 ENCOUNTER — Telehealth: Payer: Self-pay | Admitting: Cardiology

## 2014-09-25 DIAGNOSIS — I429 Cardiomyopathy, unspecified: Secondary | ICD-10-CM

## 2014-09-25 DIAGNOSIS — M109 Gout, unspecified: Secondary | ICD-10-CM

## 2014-09-25 DIAGNOSIS — E119 Type 2 diabetes mellitus without complications: Secondary | ICD-10-CM | POA: Diagnosis not present

## 2014-09-25 DIAGNOSIS — I10 Essential (primary) hypertension: Secondary | ICD-10-CM

## 2014-09-25 DIAGNOSIS — E785 Hyperlipidemia, unspecified: Secondary | ICD-10-CM

## 2014-09-25 DIAGNOSIS — M1000 Idiopathic gout, unspecified site: Secondary | ICD-10-CM

## 2014-09-25 DIAGNOSIS — I428 Other cardiomyopathies: Secondary | ICD-10-CM

## 2014-09-25 LAB — COMPREHENSIVE METABOLIC PANEL
ALT: 23 U/L (ref 0–35)
AST: 25 U/L (ref 0–37)
Albumin: 4.2 g/dL (ref 3.5–5.2)
Alkaline Phosphatase: 74 U/L (ref 39–117)
BUN: 15 mg/dL (ref 6–23)
CALCIUM: 9.1 mg/dL (ref 8.4–10.5)
CHLORIDE: 104 meq/L (ref 96–112)
CO2: 28 mEq/L (ref 19–32)
Creatinine, Ser: 0.61 mg/dL (ref 0.40–1.20)
GFR: 106.28 mL/min (ref >60.00)
GLUCOSE: 124 mg/dL — AB (ref 70–99)
Potassium: 4.5 mEq/L (ref 3.5–5.1)
Sodium: 140 mEq/L (ref 135–145)
TOTAL PROTEIN: 6.7 g/dL (ref 6.0–8.3)
Total Bilirubin: 0.4 mg/dL (ref 0.2–1.2)

## 2014-09-25 LAB — MICROALBUMIN / CREATININE URINE RATIO
Creatinine,U: 96.6 mg/dL
MICROALB UR: 0.9 mg/dL (ref 0.0–1.9)
MICROALB/CREAT RATIO: 0.9 mg/g (ref 0.0–30.0)

## 2014-09-25 LAB — LDL CHOLESTEROL, DIRECT: Direct LDL: 107 mg/dL

## 2014-09-25 LAB — HEMOGLOBIN A1C: Hgb A1c MFr Bld: 6.5 % (ref 4.6–6.5)

## 2014-09-25 LAB — URIC ACID: URIC ACID, SERUM: 7.7 mg/dL — AB (ref 2.4–7.0)

## 2014-09-25 LAB — LIPID PANEL
Cholesterol: 210 mg/dL — ABNORMAL HIGH (ref 0–200)
HDL: 41 mg/dL (ref >39.00)
NonHDL: 169
TRIGLYCERIDES: 374 mg/dL — AB (ref 0.0–149.0)
Total CHOL/HDL Ratio: 5
VLDL: 74.8 mg/dL — AB (ref 0.0–40.0)

## 2014-09-25 NOTE — Telephone Encounter (Signed)
Spoke with pt and reminded pt of remote transmission that is due today. Pt verbalized understanding.   

## 2014-09-25 NOTE — Progress Notes (Signed)
Remote pacemaker transmission.   

## 2014-09-25 NOTE — Telephone Encounter (Signed)
Labs and dx?  

## 2014-09-27 ENCOUNTER — Ambulatory Visit (INDEPENDENT_AMBULATORY_CARE_PROVIDER_SITE_OTHER): Payer: BLUE CROSS/BLUE SHIELD | Admitting: Internal Medicine

## 2014-09-27 ENCOUNTER — Encounter: Payer: Self-pay | Admitting: Internal Medicine

## 2014-09-27 VITALS — BP 140/84 | HR 74 | Temp 98.1°F | Resp 16 | Ht 62.0 in | Wt 178.5 lb

## 2014-09-27 DIAGNOSIS — E785 Hyperlipidemia, unspecified: Secondary | ICD-10-CM

## 2014-09-27 DIAGNOSIS — E119 Type 2 diabetes mellitus without complications: Secondary | ICD-10-CM

## 2014-09-27 DIAGNOSIS — E669 Obesity, unspecified: Secondary | ICD-10-CM

## 2014-09-27 DIAGNOSIS — I10 Essential (primary) hypertension: Secondary | ICD-10-CM | POA: Diagnosis not present

## 2014-09-27 NOTE — Progress Notes (Signed)
Pre-visit discussion using our clinic review tool. No additional management support is needed unless otherwise documented below in the visit note.  

## 2014-09-27 NOTE — Assessment & Plan Note (Signed)
Well-controlled on diet histoically, but hemoglobin A1c has risen slightly due to dietary noncompliance. .I have addressed  BMI and recommended wt loss of 10% of body weigh over the next 6 months using a low glycemic index diet and regular exercise a minimum of 5 days per week.   her foot exam is normal today.   Lab Results  Component Value Date   HGBA1C 6.5 09/25/2014   Lab Results  Component Value Date   MICROALBUR 0.9 09/25/2014

## 2014-09-27 NOTE — Patient Instructions (Signed)
Your diabetes remains under excellent control  And your cholesterol is improving .All of your labs are normal, except your triglycerides which is raising your total cholesterol .  This should improve with a low glycemic index diet and Exercise .  We should repeat in 6 months.     Your other labs are also normal. Please continue your current medications. return in 6 months for follow up on diabetes and make sure you are seeing your eye doctor at least once a year.

## 2014-09-27 NOTE — Progress Notes (Signed)
Subjective:  Patient ID: Julia Pierce, female    DOB: Sep 10, 1955  Age: 59 y.o. MRN: [id number]  CC: The primary encounter diagnosis was Diabetes mellitus type 2, diet-controlled. Diagnoses of Essential hypertension, Hyperlipidemia with target LDL less than 100, and Obesity (BMI 30-39.9) were also pertinent to this visit.  HPI Julia Pierce presents for  3 month follow up on diet controlled DM , obesity, hypertension and hyperlipidemia. .   She has been eating better but still spending considerable amount of time in hospital or away from home due to new granddaughter being so ill.   Not exercising. Due to Morton's neuroma  flaring up,  Having pain up her shin .  Sees podiatrist Matt Troxler but no injections done  Due to concurrent nerve damage from back surgery.    Weaning herself off of benadryl and narcotics (prescribed by [facility]) . ,  Feels more alert and focused  Since for the past 7 weeks  But has has more diffuse joint pain. Sees Dr Primus Bravo last week   Sugars 120 to 148.  No lows,  Craves chocolate and bread but has cut out bread.    Outpatient Prescriptions Prior to Visit  Medication Sig Dispense Refill  . aspirin EC 81 MG tablet Take 81 mg by mouth daily.    . Azelaic Acid (FINACEA EX) Apply 1 application topically 2 (two) times daily.    . B Complex Vitamins (B COMPLEX PO) Take 1 tablet by mouth daily.     . calcium carbonate (OS-CAL) 600 MG TABS Take 600 mg by mouth daily.      . Cholecalciferol (VITAMIN D3) 2000 UNITS TABS Take 1 tablet by mouth daily.    Marland Kitchen esomeprazole (NEXIUM) 20 MG capsule Take 40 mg by mouth daily at 12 noon.    . fluticasone (FLONASE) 50 MCG/ACT nasal spray Place 2 sprays into both nostrils daily.    Marland Kitchen HYDROcodone-acetaminophen (NORCO/VICODIN) 5-325 MG per tablet Limit 1-3 tabs by mouth daily if tolerated 80 tablet 0  . losartan-hydrochlorothiazide (HYZAAR) 50-12.5 MG per tablet TAKE 1 TABLET BY MOUTH DAILY. 90 tablet 1  . methocarbamol (ROBAXIN) 500  MG tablet TAKE 1 TABLET BY MOUTH EVERY 8 HOURS AS NEEDED FOR MUSCLE SPASMS.Marland KitchenFILL ON 07-06-13 270 tablet 2  . metoprolol tartrate (LOPRESSOR) 25 MG tablet TAKE 1/2 TABLET BY MOUTH TWICE A DAY    . Multiple Vitamin (MULTIVITAMIN) tablet Take 1 tablet by mouth daily.      . Omega-3 Fatty Acids (FISH OIL) 1000 MG CAPS Take 1 capsule by mouth 2 (two) times daily.    . sertraline (ZOLOFT) 100 MG tablet TAKE 1 TABLET BY MOUTH EVERY DAY 90 tablet 0  . spironolactone (ALDACTONE) 25 MG tablet Take 25 mg by mouth daily.    Marland Kitchen tolterodine (DETROL) 2 MG tablet Take 2 mg by mouth 2 (two) times daily.    . colchicine 0.6 MG tablet Take 1 tablet (0.6 mg total) by mouth 2 (two) times daily. (Patient not taking: Reported on 09/05/2014) 60 tablet 0   No facility-administered medications prior to visit.    Review of Systems;  Patient denies headache, fevers, malaise, unintentional weight loss, skin rash, eye pain, sinus congestion and sinus pain, sore throat, dysphagia,  hemoptysis , cough, dyspnea, wheezing, chest pain, palpitations, orthopnea, edema, abdominal pain, nausea, melena, diarrhea, constipation, flank pain, dysuria, hematuria, urinary  Frequency, nocturia, numbness, tingling, seizures,  Focal weakness, Loss of consciousness,  Tremor, insomnia, depression, anxiety, and suicidal ideation.  Objective:  BP 140/84 mmHg  Pulse 74  Temp(Src) 98.1 F (36.7 C) (Oral)  Resp 16  Ht 5\' 2"  (1.575 m)  Wt 178 lb 8 oz (80.967 kg)  BMI 32.64 kg/m2  SpO2 98%  BP Readings from Last 3 Encounters:  09/27/14 140/84  09/05/14 122/70  07/31/14 136/84    Wt Readings from Last 3 Encounters:  09/27/14 178 lb 8 oz (80.967 kg)  09/05/14 178 lb (80.74 kg)  07/31/14 178 lb 12 oz (81.08 kg)    General appearance: alert, cooperative and appears stated age Ears: normal TM's and external ear canals both ears Throat: lips, mucosa, and tongue normal; teeth and gums normal Neck: no adenopathy, no carotid bruit,  supple, symmetrical, trachea midline and thyroid not enlarged, symmetric, no tenderness/mass/nodules Back: symmetric, no curvature. ROM normal. No CVA tenderness. Lungs: clear to auscultation bilaterally Heart: regular rate and rhythm, S1, S2 normal, no murmur, click, rub or gallop Abdomen: soft, non-tender; bowel sounds normal; no masses,  no organomegaly Pulses: 2+ and symmetric Skin: Skin color, texture, turgor normal. No rashes or lesions Lymph nodes: Cervical, supraclavicular, and axillary nodes normal.  Lab Results  Component Value Date   HGBA1C 6.5 09/25/2014   HGBA1C 7.0* 06/26/2014   HGBA1C 6.9* 03/20/2014    Lab Results  Component Value Date   CREATININE 0.61 09/25/2014   CREATININE 0.78 06/26/2014   CREATININE 0.8 03/20/2014    Lab Results  Component Value Date   WBC 9.6 06/01/2013   HGB 12.9 06/01/2013   HCT 37.4 06/01/2013   PLT 204 06/01/2013   GLUCOSE 124* 09/25/2014   CHOL 210* 09/25/2014   TRIG 374.0* 09/25/2014   HDL 41.00 09/25/2014   LDLDIRECT 107.0 09/25/2014   LDLCALC 88 08/17/2013   ALT 23 09/25/2014   AST 25 09/25/2014   NA 140 09/25/2014   K 4.5 09/25/2014   CL 104 09/25/2014   CREATININE 0.61 09/25/2014   BUN 15 09/25/2014   CO2 28 09/25/2014   TSH 3.11 05/09/2013   HGBA1C 6.5 09/25/2014   MICROALBUR 0.9 09/25/2014    No results found.  Assessment & Plan:   Problem List Items Addressed This Visit    Hyperlipidemia with target LDL less than 100    LDL has improved with fish oil,  But triglycerides remain  over 300 due to diet.   Recommended a low glycemic index diet, weight  loss and regular exercise.  Advised t o  repeat in 3 months, will add fenofibrate if still > 300  Lab Results  Component Value Date   CHOL 223* 05/17/2014   HDL 52.20 05/17/2014   LDLDIRECT 109.0 05/17/2014   TRIG 343.0* 05/17/2014   CHOLHDL 4 05/17/2014   Lab Results  Component Value Date   ALT 20 05/17/2014   AST 24 05/17/2014   ALKPHOS 73 05/17/2014     BILITOT 0.4 05/17/2014              Obesity (BMI 30-39.9)    Unchanged BMI  Despite following a low glycemic index diet.  Recommended  utilizing smaller more frequent meals to increase metabolism.  I have also recommended that patient start exercising with a goal of 30 minutes of aerobic exercise a minimum of 5 days per week.         Hypertension    Well controlled on current regimen. Renal function stable, no changes today.  Lab Results  Component Value Date   CREATININE 0.61 09/25/2014   Lab Results  Component  Value Date   NA 140 09/25/2014   K 4.5 09/25/2014   CL 104 09/25/2014   CO2 28 09/25/2014           Diabetes mellitus type 2, diet-controlled - Primary    Well-controlled on diet histoically, but hemoglobin A1c has risen slightly due to dietary noncompliance. .I have addressed  BMI and recommended wt loss of 10% of body weigh over the next 6 months using a low glycemic index diet and regular exercise a minimum of 5 days per week.   her foot exam is normal today.   Lab Results  Component Value Date   HGBA1C 6.5 09/25/2014   Lab Results  Component Value Date   MICROALBUR 0.9 09/25/2014                      I am having Ms. Julien Girt maintain her calcium carbonate, multivitamin, spironolactone, B Complex Vitamins (B COMPLEX PO), tolterodine, Azelaic Acid (FINACEA EX), Vitamin D3, aspirin EC, esomeprazole, fluticasone, metoprolol tartrate, losartan-hydrochlorothiazide, Fish Oil, colchicine, sertraline, HYDROcodone-acetaminophen, and methocarbamol.  No orders of the defined types were placed in this encounter.    There are no discontinued medications.  Follow-up: Return in about 6 months (around 03/29/2015) for follow up diabetes.   Crecencio Mc, MD

## 2014-09-27 NOTE — Assessment & Plan Note (Signed)
Well controlled on current regimen. Renal function stable, no changes today.  Lab Results  Component Value Date   CREATININE 0.61 09/25/2014   Lab Results  Component Value Date   NA 140 09/25/2014   K 4.5 09/25/2014   CL 104 09/25/2014   CO2 28 09/25/2014

## 2014-09-29 LAB — CUP PACEART REMOTE DEVICE CHECK
Battery Remaining Longevity: 68 mo
Brady Statistic AP VP Percent: 0.19 %
Brady Statistic AP VS Percent: 0.04 %
Brady Statistic AS VP Percent: 99.65 %
Brady Statistic AS VS Percent: 0.12 %
Brady Statistic RA Percent Paced: 0.23 %
Brady Statistic RV Percent Paced: 99.84 %
Date Time Interrogation Session: 20160606230946
Lead Channel Impedance Value: 399 Ohm
Lead Channel Impedance Value: 418 Ohm
Lead Channel Impedance Value: 532 Ohm
Lead Channel Impedance Value: 570 Ohm
Lead Channel Pacing Threshold Amplitude: 1.875 V
Lead Channel Pacing Threshold Pulse Width: 0.4 ms
Lead Channel Pacing Threshold Pulse Width: 0.4 ms
Lead Channel Pacing Threshold Pulse Width: 0.4 ms
Lead Channel Sensing Intrinsic Amplitude: 0.75 mV
Lead Channel Sensing Intrinsic Amplitude: 0.75 mV
Lead Channel Sensing Intrinsic Amplitude: 18.75 mV
Lead Channel Setting Pacing Amplitude: 1.5 V
Lead Channel Setting Pacing Amplitude: 2.5 V
Lead Channel Setting Pacing Pulse Width: 0.4 ms
Lead Channel Setting Sensing Sensitivity: 0.9 mV
MDC IDC MSMT BATTERY VOLTAGE: 3.01 V
MDC IDC MSMT LEADCHNL LV IMPEDANCE VALUE: 380 Ohm
MDC IDC MSMT LEADCHNL LV IMPEDANCE VALUE: 494 Ohm
MDC IDC MSMT LEADCHNL RA IMPEDANCE VALUE: 399 Ohm
MDC IDC MSMT LEADCHNL RA PACING THRESHOLD AMPLITUDE: 0.5 V
MDC IDC MSMT LEADCHNL RV IMPEDANCE VALUE: 437 Ohm
MDC IDC MSMT LEADCHNL RV IMPEDANCE VALUE: 494 Ohm
MDC IDC MSMT LEADCHNL RV PACING THRESHOLD AMPLITUDE: 0.75 V
MDC IDC MSMT LEADCHNL RV SENSING INTR AMPL: 18.75 mV
MDC IDC SET LEADCHNL LV PACING PULSEWIDTH: 0.8 ms
MDC IDC SET LEADCHNL RV PACING AMPLITUDE: 2 V
MDC IDC SET ZONE DETECTION INTERVAL: 400 ms
Zone Setting Detection Interval: 350 ms

## 2014-09-29 NOTE — Assessment & Plan Note (Signed)
LDL has improved with fish oil,  But triglycerides remain  over 300 due to diet.   Recommended a low glycemic index diet, weight  loss and regular exercise.  Advised t o  repeat in 3 months, will add fenofibrate if still > 300  Lab Results  Component Value Date   CHOL 223* 05/17/2014   HDL 52.20 05/17/2014   LDLDIRECT 109.0 05/17/2014   TRIG 343.0* 05/17/2014   CHOLHDL 4 05/17/2014   Lab Results  Component Value Date   ALT 20 05/17/2014   AST 24 05/17/2014   ALKPHOS 73 05/17/2014   BILITOT 0.4 05/17/2014

## 2014-09-29 NOTE — Assessment & Plan Note (Signed)
Unchanged BMI  Despite following a low glycemic index diet.  Recommended  utilizing smaller more frequent meals to increase metabolism.  I have also recommended that patient start exercising with a goal of 30 minutes of aerobic exercise a minimum of 5 days per week.

## 2014-10-03 ENCOUNTER — Ambulatory Visit: Payer: BLUE CROSS/BLUE SHIELD | Attending: Pain Medicine | Admitting: Pain Medicine

## 2014-10-03 ENCOUNTER — Encounter: Payer: Self-pay | Admitting: Pain Medicine

## 2014-10-03 VITALS — BP 126/72 | HR 75 | Temp 96.1°F | Resp 16 | Ht 62.0 in | Wt 178.0 lb

## 2014-10-03 DIAGNOSIS — M5030 Other cervical disc degeneration, unspecified cervical region: Secondary | ICD-10-CM | POA: Diagnosis not present

## 2014-10-03 DIAGNOSIS — Z981 Arthrodesis status: Secondary | ICD-10-CM | POA: Insufficient documentation

## 2014-10-03 DIAGNOSIS — M5481 Occipital neuralgia: Secondary | ICD-10-CM | POA: Diagnosis not present

## 2014-10-03 DIAGNOSIS — M7060 Trochanteric bursitis, unspecified hip: Secondary | ICD-10-CM | POA: Diagnosis not present

## 2014-10-03 DIAGNOSIS — R51 Headache: Secondary | ICD-10-CM | POA: Diagnosis not present

## 2014-10-03 DIAGNOSIS — M5136 Other intervertebral disc degeneration, lumbar region: Secondary | ICD-10-CM | POA: Insufficient documentation

## 2014-10-03 DIAGNOSIS — M47812 Spondylosis without myelopathy or radiculopathy, cervical region: Secondary | ICD-10-CM | POA: Diagnosis not present

## 2014-10-03 DIAGNOSIS — M542 Cervicalgia: Secondary | ICD-10-CM | POA: Diagnosis present

## 2014-10-03 DIAGNOSIS — M47816 Spondylosis without myelopathy or radiculopathy, lumbar region: Secondary | ICD-10-CM | POA: Insufficient documentation

## 2014-10-03 DIAGNOSIS — M51369 Other intervertebral disc degeneration, lumbar region without mention of lumbar back pain or lower extremity pain: Secondary | ICD-10-CM | POA: Insufficient documentation

## 2014-10-03 MED ORDER — HYDROCODONE-ACETAMINOPHEN 5-325 MG PO TABS: ORAL_TABLET | ORAL | Status: DC

## 2014-10-03 NOTE — Patient Instructions (Addendum)
Continue present medications.Discuss Cymbalta 20 mg  to replace  Zoloft.Discuss this with Dr. Derrel Nip   Exercise as discussed and avoid aggravation of symptoms  F/U PCP for evaliation of  BP and general medical  condition.  F/U surgical evaluation.  F/U neurological evaluation.  May consider radiofrequency rhizolysis or intraspinal procedures pending response to present treatment and F/U evaluation.  Patient to call [facility] should patient have concerns prior to scheduled return appointment.

## 2014-10-03 NOTE — Progress Notes (Signed)
Discharged to home ambulatory with script in hand for hydrocodone.  Return in 1 month.

## 2014-10-03 NOTE — Progress Notes (Signed)
   Subjective:    Patient ID: Julia Pierce, female    DOB: 1955/12/26, 59 y.o.   MRN: [id number]  HPI   patient is 59 year old female who returns to [place] for further evaluation and treatment of pain involving the neck with headaches as well as pain involving the thoracic lumbar lower extremity regions. Patient states that she has had headaches with pain radiating from the neck to the back of the head which been quite bothersome recently. Patient denies trauma change in events of daily living the call significant change in symptomatology states that the headaches of the same headaches which she is noted for years. We discussed patient's overall condition we will continue to observe response to the present treatment and will consider patient interventional treatment as well as additional modification of treatment pending follow-up evaluation. Patient is understanding and agreement status treatment plan.     Review of Systems     Objective:   Physical Exam   there was tends to palpation of paraspinal muscles and cervical and cervical facet region with palpation of the splenius capitis and occipitalis musculature region reproducing moderate to moderately severe discomfort. No new lesions of the head and neck were noted. There was tends to palpation over the cervical and thoracic facet and paraspinal musculature regions of moderate degree. Moderate muscle spasms noted on the right greater than the left. No crepitus of the thoracic region was noted. Patient was unremarkable Spurling's maneuver and bilateral equal grip strength and Tinel and Phalen's maneuver were without increase of pain of any significant degree. Palpation over the thoracic paraspinal muscles region lower thoracic region was a tends to palpation of moderate degree with palpation over the lumbar paraspinal muscles region lumbar facet region associated with tinged palpation of moderate moderately severe degree. There was  increased pain with extension and palpation of the lumbar facets  Tenderness  palpation of the greater trochanteric region iliotibial band region of moderate degree. Straight leg raising was tolerates approximately 30 without increased pain with dorsiflexion noted no sensory deficit of dermatomal distribution detected abdomen nontender no costovertebral angle tenderness noted.      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 oxycodone F/U PCP for evaliation of  BP and general medical  condition.  F/U surgical evaluation.  F/U neurological evaluation.   exercises with caution to avoid aggravation of symptomatologies and continue massage treatment  May consider radiofrequency rhizolysis or intraspinal procedures pending response to present treatment and F/U evaluation.  Patient to call [facility] should patient have concerns prior to scheduled return appointment.

## 2014-10-03 NOTE — Progress Notes (Signed)
Safety precautions to be maintained throughout the outpatient stay will include: orient to surroundings, keep bed in low position, maintain call bell within reach at all times, provide assistance with transfer out of bed and ambulation.  

## 2014-10-04 ENCOUNTER — Encounter: Payer: Self-pay | Admitting: Internal Medicine

## 2014-10-09 ENCOUNTER — Encounter: Payer: Self-pay | Admitting: Cardiology

## 2014-10-09 MED ORDER — DULOXETINE HCL 30 MG PO CPEP: 30.0000 mg | ORAL_CAPSULE | Freq: Every day | ORAL | Status: DC

## 2014-10-10 ENCOUNTER — Encounter: Payer: Self-pay | Admitting: *Deleted

## 2014-10-13 ENCOUNTER — Encounter: Payer: Self-pay | Admitting: Internal Medicine

## 2014-10-17 ENCOUNTER — Encounter: Payer: Self-pay | Admitting: Internal Medicine

## 2014-10-18 ENCOUNTER — Encounter: Payer: Self-pay | Admitting: Internal Medicine

## 2014-10-18 DIAGNOSIS — B078 Other viral warts: Secondary | ICD-10-CM | POA: Diagnosis not present

## 2014-10-18 DIAGNOSIS — Z872 Personal history of diseases of the skin and subcutaneous tissue: Secondary | ICD-10-CM | POA: Diagnosis not present

## 2014-10-18 DIAGNOSIS — G894 Chronic pain syndrome: Secondary | ICD-10-CM | POA: Insufficient documentation

## 2014-10-18 DIAGNOSIS — L720 Epidermal cyst: Secondary | ICD-10-CM | POA: Diagnosis not present

## 2014-10-18 DIAGNOSIS — Z1283 Encounter for screening for malignant neoplasm of skin: Secondary | ICD-10-CM | POA: Diagnosis not present

## 2014-10-31 ENCOUNTER — Other Ambulatory Visit: Payer: Self-pay | Admitting: Internal Medicine

## 2014-11-01 DIAGNOSIS — I10 Essential (primary) hypertension: Secondary | ICD-10-CM | POA: Insufficient documentation

## 2014-11-02 ENCOUNTER — Ambulatory Visit: Payer: BLUE CROSS/BLUE SHIELD | Attending: Pain Medicine | Admitting: Pain Medicine

## 2014-11-02 ENCOUNTER — Encounter: Payer: Self-pay | Admitting: Pain Medicine

## 2014-11-02 VITALS — BP 131/70 | HR 78 | Temp 97.7°F | Resp 18 | Ht 62.0 in | Wt 176.0 lb

## 2014-11-02 DIAGNOSIS — R51 Headache: Secondary | ICD-10-CM | POA: Insufficient documentation

## 2014-11-02 DIAGNOSIS — M47816 Spondylosis without myelopathy or radiculopathy, lumbar region: Secondary | ICD-10-CM | POA: Diagnosis not present

## 2014-11-02 DIAGNOSIS — M533 Sacrococcygeal disorders, not elsewhere classified: Secondary | ICD-10-CM | POA: Insufficient documentation

## 2014-11-02 DIAGNOSIS — M542 Cervicalgia: Secondary | ICD-10-CM | POA: Diagnosis present

## 2014-11-02 DIAGNOSIS — M5481 Occipital neuralgia: Secondary | ICD-10-CM

## 2014-11-02 DIAGNOSIS — M47812 Spondylosis without myelopathy or radiculopathy, cervical region: Secondary | ICD-10-CM | POA: Insufficient documentation

## 2014-11-02 DIAGNOSIS — M5030 Other cervical disc degeneration, unspecified cervical region: Secondary | ICD-10-CM | POA: Insufficient documentation

## 2014-11-02 DIAGNOSIS — Z981 Arthrodesis status: Secondary | ICD-10-CM | POA: Insufficient documentation

## 2014-11-02 DIAGNOSIS — M546 Pain in thoracic spine: Secondary | ICD-10-CM | POA: Diagnosis present

## 2014-11-02 DIAGNOSIS — M5136 Other intervertebral disc degeneration, lumbar region: Secondary | ICD-10-CM | POA: Insufficient documentation

## 2014-11-02 HISTORY — DX: Occipital neuralgia: M54.81

## 2014-11-02 MED ORDER — HYDROCODONE-ACETAMINOPHEN 5-325 MG PO TABS: ORAL_TABLET | ORAL | Status: DC

## 2014-11-02 NOTE — Progress Notes (Signed)
   Subjective:    Patient ID: Julia Pierce, female    DOB: 10/29/1955, 59 y.o.   MRN: [id number]  HPI   patient is 59 year old female who returns to [place] for further evaluation and treatment of pain involving the region of the neck headaches upper mid and lower back and  Upper and lower extremity regions. On today's visit patient admits to headaches and pain and spasms occurring in the upper and mid back region. Patient is undergoing massage therapy which benefits to some degree. We also observe patient's body habitus discussed further exercises massage and treatment of patient's condition. We will continue medications as prescribed at this time. Patient is to call [facility] should her headaches persist as well as significant spasms of the thoracic region persist. We will consider interventional treatment in modification of medications and other aspects of treatment regimen as discussed and explained to patient today's visit. The patient was understanding and in agreement status treatment plan.    Review of Systems     Objective:   Physical Exam   there was tenderness of the spinous Tenderness occipitalis musculature region palpation which reproduces moderate to moderately severe discomfort especially on the right. Patient was with evidence of significant increased muscle spasms occurring in the trapezius levator scapula and rhomboid musculature regions on the right. The left shoulder was noted to be lower than the right shoulder when patient was observed standing. Patient appeared to be with slightly decreased grip strength. Tinel and Phalen's maneuver without increased pain of significant degree. No crepitus of the thoracic region was noted. Palpation over the lumbar paraspinal muscle lumbar facet region was a tends to palpation with lateral bending and rotation and extension to palpation lumbar facets reproducing mild to moderate discomfort. There was mild/moderate  tends of the PSIS and PII S region. Straight leg raising tolerates approximately 30 without increased pain with dorsiflexion noted. Negative clonus negative Homans. Abdomen nontender no costovertebral angle tenderness noted.      Assessment & Plan:   Degenerative disc disease cervical spine  Degenerative changes C2-3, C3-4, C4-5, and C5-6, with multilevel degenerative changes of the cervical spine  Status post cervical fusion   Bilateral occipital neuralgia   Cervicogenic headache   Sacroiliac joint dysfunction    degenerative disc disease lumbar spine  L3-4 L4-5 and L5-S1 degenerative changes    plan   Continue present medication hydrocodone acetaminophen  F/U PCP Dr. Derrel Nip for evaliation of  BP and general medical  condition.  F/U surgical evaluation  F/U neurological evaluation  Patient will continue exercise program swimming and massage therapy with caution to avoid aggravation of his symptoms  May consider radiofrequency rhizolysis or intraspinal procedures pending response to present treatment and F/U evaluation.  Patient to call [facility] should patient have concerns prior to scheduled return appointment.

## 2014-11-02 NOTE — Patient Instructions (Signed)
Continue present medication hydrocodone acetaminophen  F/U PCP Dr.Tullo  for evaliation of  BP and general medical  condition.  F/U surgical evaluation  F/U neurological evaluation  May consider radiofrequency rhizolysis or intraspinal procedures pending response to present treatment and F/U evaluation.  Patient to call [facility] should patient have concerns prior to scheduled return appointment.

## 2014-11-02 NOTE — Progress Notes (Signed)
Safety precautions to be maintained throughout the outpatient stay will include: orient to surroundings, keep bed in low position, maintain call bell within reach at all times, provide assistance with transfer out of bed and ambulation.  

## 2014-11-04 ENCOUNTER — Other Ambulatory Visit: Payer: Self-pay | Admitting: Internal Medicine

## 2014-11-13 ENCOUNTER — Other Ambulatory Visit: Payer: Self-pay | Admitting: Internal Medicine

## 2014-11-13 NOTE — Telephone Encounter (Signed)
Ok to fill 

## 2014-11-15 NOTE — Telephone Encounter (Signed)
Ok to refill,  Refill sent  

## 2014-11-16 ENCOUNTER — Other Ambulatory Visit: Payer: Self-pay | Admitting: Pain Medicine

## 2014-12-04 ENCOUNTER — Ambulatory Visit: Payer: BLUE CROSS/BLUE SHIELD | Attending: Pain Medicine | Admitting: Pain Medicine

## 2014-12-04 ENCOUNTER — Encounter: Payer: Self-pay | Admitting: Pain Medicine

## 2014-12-04 VITALS — BP 139/62 | HR 70 | Temp 98.2°F | Resp 18 | Ht 62.0 in | Wt 176.0 lb

## 2014-12-04 DIAGNOSIS — R51 Headache: Secondary | ICD-10-CM | POA: Diagnosis not present

## 2014-12-04 DIAGNOSIS — M5136 Other intervertebral disc degeneration, lumbar region: Secondary | ICD-10-CM

## 2014-12-04 DIAGNOSIS — M47812 Spondylosis without myelopathy or radiculopathy, cervical region: Secondary | ICD-10-CM

## 2014-12-04 DIAGNOSIS — M5030 Other cervical disc degeneration, unspecified cervical region: Secondary | ICD-10-CM

## 2014-12-04 DIAGNOSIS — Z981 Arthrodesis status: Secondary | ICD-10-CM | POA: Insufficient documentation

## 2014-12-04 DIAGNOSIS — M5481 Occipital neuralgia: Secondary | ICD-10-CM

## 2014-12-04 DIAGNOSIS — M47816 Spondylosis without myelopathy or radiculopathy, lumbar region: Secondary | ICD-10-CM

## 2014-12-04 DIAGNOSIS — M542 Cervicalgia: Secondary | ICD-10-CM | POA: Diagnosis present

## 2014-12-04 DIAGNOSIS — M51369 Other intervertebral disc degeneration, lumbar region without mention of lumbar back pain or lower extremity pain: Secondary | ICD-10-CM

## 2014-12-04 MED ORDER — ORPHENADRINE CITRATE 30 MG/ML IJ SOLN
INTRAMUSCULAR | Status: AC
  Administered 2014-12-04: 09:00:00
  Filled 2014-12-04: qty 2

## 2014-12-04 MED ORDER — FENTANYL CITRATE (PF) 100 MCG/2ML IJ SOLN
INTRAMUSCULAR | Status: AC
  Administered 2014-12-04: 50 ug via INTRAVENOUS
  Filled 2014-12-04: qty 2

## 2014-12-04 MED ORDER — MIDAZOLAM HCL 5 MG/5ML IJ SOLN
INTRAMUSCULAR | Status: AC
  Administered 2014-12-04: 2 mg via INTRAVENOUS
  Filled 2014-12-04: qty 5

## 2014-12-04 MED ORDER — TRIAMCINOLONE ACETONIDE 40 MG/ML IJ SUSP
INTRAMUSCULAR | Status: AC
  Administered 2014-12-04: 09:00:00
  Filled 2014-12-04: qty 1

## 2014-12-04 MED ORDER — BUPIVACAINE HCL (PF) 0.25 % IJ SOLN
INTRAMUSCULAR | Status: AC
  Administered 2014-12-04: 09:00:00
  Filled 2014-12-04: qty 30

## 2014-12-04 MED ORDER — HYDROCODONE-ACETAMINOPHEN 5-325 MG PO TABS: ORAL_TABLET | ORAL | Status: DC

## 2014-12-04 NOTE — Progress Notes (Signed)
Safety precautions to be maintained throughout the outpatient stay will include: orient to surroundings, keep bed in low position, maintain call bell within reach at all times, provide assistance with transfer out of bed and ambulation.  

## 2014-12-04 NOTE — Progress Notes (Signed)
   Subjective:    Patient ID: Julia Pierce, female    DOB: 1956-02-26, 59 y.o.   MRN: [id number]  HPI  NOTE: The patient is a 59 y.o.-year-old female who [facility] for further evaluation and treatment of pain consisting of pain involving the region of the neck and headache.  Patient is with prior studies revealing patient to be with degenerative changes of the cervical spine with C2-3, C3-4, C4-5, and C5-6 involvement with multilevel degenerative changes. Patient is status post cervical fusion. There is concern regarding headaches been due to component of greater occipital neuralgia and cervicogenic headaches .  The risks, benefits, and expectations of the procedure have been discussed and explained to patient, who is understanding and wishes to proceed with interventional treatment as discussed and as explained to patient.  Will proceed with greater occipital nerve blocks with myoneural block injections at this time as discussed and as explained to patient.  All are understanding and in agreement with suggested treatment plan.    PROCEDURE:  Greater occipital nerve block on the  left side with IV Versed, IV Fentanyl, conscious sedation, EKG, blood pressure, pulse, pulse oximetry monitoring.  Procedure performed with patient in prone position.  Greater occipital nerve block on the left side.   With patient in prone position, Betadine prep of proposed entry site accomplished.  Following identification of the nuchal ridge, 22 -gauge needle was inserted at the level of the nuchal ridge medial to the occipital artery.  Following negative aspiration, 4cc 0.25% bupivacaine with Kenalog injected for left greater occipital nerve block.  Needle was removed.  Patient tolerated injection well.   Greater occipital nerve block on the rightt side. The greater occipital nerve block on the right side was performed exactly as the left greater occipital nerve block was performed and utilizing  the same technique.    Myoneural block injections of the cervical paraspinal musculature region  Following Betadine prep of proposed entry site 22-gauge needle was inserted in the cervical paraspinal musculature region and following negative aspiration 2 cc of 0.25% bupivacaine with Norflex was injected for myoneural block injections of the cervical paraspinal musculature region 4   The patient tolerated the procedure well   A total of 10 mg Kenalog was utilized for the entire procedure.  PLAN:    1. Medications: Will continue presently prescribed medication  Hydrocodone acetaminophen at this time. 2. Patient to follow up with primary care physician  Dr.Tullo for evaluation of blood pressure and general medical condition status post procedure performed on today's visit. 3. Neurological evaluation for further assessment of headaches for further studies as discussed. 4. Surgical evaluation as discussed.  5. Patient may be candidate for Botox injections, radiofrequency procedures, as well as implantation type procedures pending response to treatment rendered on today's visit and pending follow-up evaluation. 6. Patient has been advised to adhere to proper body mechanics and to avoid activities which appear to aggravate condition.cations:  Will continue presently prescribed medications at this time. 7. The patient is understanding and in agreement with the suggested treatment plan.   Review of Systems     Objective:   Physical Exam        Assessment & Plan:

## 2014-12-04 NOTE — Patient Instructions (Addendum)
Continue present medication hydrocodone acetaminophen  F/U PCP Dr.Tullo  for evaliation of  BP and general medical  condition  F/U surgical evaluation  F/U neurological evaluation  May consider radiofrequency rhizolysis or intraspinal procedures pending response to present treatment and F/U evaluation   Patient to call Pain Management Center should patient have concerns prior to scheduled return appointmen. Pain Management Discharge Instructions  General Discharge Instructions :  If you need to reach your doctor call: Monday-Friday 8:00 am - 4:00 pm at [phone] or toll free [phone].  After clinic hours [phone] to have operator reach doctor.  Bring all of your medication bottles to all your appointments in the pain clinic.  To cancel or reschedule your appointment with Pain Management please remember to call 24 hours in advance to avoid a fee.  Refer to the educational materials which you have been given on: General Risks, I had my Procedure. Discharge Instructions, Post Sedation.  Post Procedure Instructions:  The drugs you were given will stay in your system until tomorrow, so for the next 24 hours you should not drive, make any legal decisions or drink any alcoholic beverages.  You may eat anything you prefer, but it is better to start with liquids then soups and crackers, and gradually work up to solid foods.  Please notify your doctor immediately if you have any unusual bleeding, trouble breathing or pain that is not related to your normal pain.  Depending on the type of procedure that was done, some parts of your body may feel week and/or numb.  This usually clears up by tonight or the next day.  Walk with the use of an assistive device or accompanied by an adult for the 24 hours.  You may use ice on the affected area for the first 24 hours.  Put ice in a Ziploc bag and cover with a towel and place against area 15 minutes on 15 minutes off.  You may switch to  heat after 24 hours.GENERAL RISKS AND COMPLICATIONS  What are the risk, side effects and possible complications? Generally speaking, most procedures are safe.  However, with any procedure there are risks, side effects, and the possibility of complications.  The risks and complications are dependent upon the sites that are lesioned, or the type of nerve block to be performed.  The closer the procedure is to the spine, the more serious the risks are.  Great care is taken when placing the radio frequency needles, block needles or lesioning probes, but sometimes complications can occur. 1. Infection: Any time there is an injection through the skin, there is a risk of infection.  This is why sterile conditions are used for these blocks.  There are four possible types of infection. 1. Localized skin infection. 2. Central Nervous System Infection-This can be in the form of Meningitis, which can be deadly. 3. Epidural Infections-This can be in the form of an epidural abscess, which can cause pressure inside of the spine, causing compression of the spinal cord with subsequent paralysis. This would require an emergency surgery to decompress, and there are no guarantees that the patient would recover from the paralysis. 4. Discitis-This is an infection of the intervertebral discs.  It occurs in about 1% of discography procedures.  It is difficult to treat and it may lead to surgery.        2. Pain: the needles have to go through skin and soft tissues, will cause soreness.       3. Damage to  internal structures:  The nerves to be lesioned may be near blood vessels or    other nerves which can be potentially damaged.       4. Bleeding: Bleeding is more common if the patient is taking blood thinners such as  aspirin, Coumadin, Ticiid, Plavix, etc., or if he/she have some genetic predisposition  such as hemophilia. Bleeding into the spinal canal can cause compression of the spinal  cord with subsequent paralysis.   This would require an emergency surgery to  decompress and there are no guarantees that the patient would recover from the  paralysis.       5. Pneumothorax:  Puncturing of a lung is a possibility, every time a needle is introduced in  the area of the chest or upper back.  Pneumothorax refers to free air around the  collapsed lung(s), inside of the thoracic cavity (chest cavity).  Another two possible  complications related to a similar event would include: Hemothorax and Chylothorax.   These are variations of the Pneumothorax, where instead of air around the collapsed  lung(s), you may have blood or chyle, respectively.       6. Spinal headaches: They may occur with any procedures in the area of the spine.       7. Persistent CSF (Cerebro-Spinal Fluid) leakage: This is a rare problem, but may occur  with prolonged intrathecal or epidural catheters either due to the formation of a fistulous  track or a dural tear.       8. Nerve damage: By working so close to the spinal cord, there is always a possibility of  nerve damage, which could be as serious as a permanent spinal cord injury with  paralysis.       9. Death:  Although rare, severe deadly allergic reactions known as "Anaphylactic  reaction" can occur to any of the medications used.      10. Worsening of the symptoms:  We can always make thing worse.  What are the chances of something like this happening? Chances of any of this occuring are extremely low.  By statistics, you have more of a chance of getting killed in a motor vehicle accident: while driving to the hospital than any of the above occurring .  Nevertheless, you should be aware that they are possibilities.  In general, it is similar to taking a shower.  Everybody knows that you can slip, hit your head and get killed.  Does that mean that you should not shower again?  Nevertheless always keep in mind that statistics do not mean anything if you happen to be on the wrong side of them.  Even if  a procedure has a 1 (one) in a 1,000,000 (million) chance of going wrong, it you happen to be that one..Also, keep in mind that by statistics, you have more of a chance of having something go wrong when taking medications.  Who should not have this procedure? If you are on a blood thinning medication (e.g. Coumadin, Plavix, see list of "Blood Thinners"), or if you have an active infection going on, you should not have the procedure.  If you are taking any blood thinners, please inform your physician.  How should I prepare for this procedure?  Do not eat or drink anything at least six hours prior to the procedure.  Bring a driver with you .  It cannot be a taxi.  Come accompanied by an adult that can drive you back, and that is strong  enough to help you if your legs get weak or numb from the local anesthetic.  Take all of your medicines the morning of the procedure with just enough water to swallow them.  If you have diabetes, make sure that you are scheduled to have your procedure done first thing in the morning, whenever possible.  If you have diabetes, take only half of your insulin dose and notify our nurse that you have done so as soon as you arrive at the clinic.  If you are diabetic, but only take blood sugar pills (oral hypoglycemic), then do not take them on the morning of your procedure.  You may take them after you have had the procedure.  Do not take aspirin or any aspirin-containing medications, at least eleven (11) days prior to the procedure.  They may prolong bleeding.  Wear loose fitting clothing that may be easy to take off and that you would not mind if it got stained with Betadine or blood.  Do not wear any jewelry or perfume  Remove any nail coloring.  It will interfere with some of our monitoring equipment.  NOTE: Remember that this is not meant to be interpreted as a complete list of all possible complications.  Unforeseen problems may occur.  BLOOD THINNERS The  following drugs contain aspirin or other products, which can cause increased bleeding during surgery and should not be taken for 2 weeks prior to and 1 week after surgery.  If you should need take something for relief of minor pain, you may take acetaminophen which is found in Tylenol,m Datril, Anacin-3 and Panadol. It is not blood thinner. The products listed below are.  Do not take any of the products listed below in addition to any listed on your instruction sheet.  A.P.C or A.P.C with Codeine Codeine Phosphate Capsules #3 Ibuprofen Ridaura  ABC compound Congesprin Imuran rimadil  Advil Cope Indocin Robaxisal  Alka-Seltzer Effervescent Pain Reliever and Antacid Coricidin or Coricidin-D  Indomethacin Rufen  Alka-Seltzer plus Cold Medicine Cosprin Ketoprofen S-A-C Tablets  Anacin Analgesic Tablets or Capsules Coumadin Korlgesic Salflex  Anacin Extra Strength Analgesic tablets or capsules CP-2 Tablets Lanoril Salicylate  Anaprox Cuprimine Capsules Levenox Salocol  Anexsia-D Dalteparin Magan Salsalate  Anodynos Darvon compound Magnesium Salicylate Sine-off  Ansaid Dasin Capsules Magsal Sodium Salicylate  Anturane Depen Capsules Marnal Soma  APF Arthritis pain formula Dewitt's Pills Measurin Stanback  Argesic Dia-Gesic Meclofenamic Sulfinpyrazone  Arthritis Bayer Timed Release Aspirin Diclofenac Meclomen Sulindac  Arthritis pain formula Anacin Dicumarol Medipren Supac  Analgesic (Safety coated) Arthralgen Diffunasal Mefanamic Suprofen  Arthritis Strength Bufferin Dihydrocodeine Mepro Compound Suprol  Arthropan liquid Dopirydamole Methcarbomol with Aspirin Synalgos  ASA tablets/Enseals Disalcid Micrainin Tagament  Ascriptin Doan's Midol Talwin  Ascriptin A/D Dolene Mobidin Tanderil  Ascriptin Extra Strength Dolobid Moblgesic Ticlid  Ascriptin with Codeine Doloprin or Doloprin with Codeine Momentum Tolectin  Asperbuf Duoprin Mono-gesic Trendar  Aspergum Duradyne Motrin or Motrin IB Triminicin   Aspirin plain, buffered or enteric coated Durasal Myochrisine Trigesic  Aspirin Suppositories Easprin Nalfon Trillsate  Aspirin with Codeine Ecotrin Regular or Extra Strength Naprosyn Uracel  Atromid-S Efficin Naproxen Ursinus  Auranofin Capsules Elmiron Neocylate Vanquish  Axotal Emagrin Norgesic Verin  Azathioprine Empirin or Empirin with Codeine Normiflo Vitamin E  Azolid Emprazil Nuprin Voltaren  Bayer Aspirin plain, buffered or children's or timed BC Tablets or powders Encaprin Orgaran Warfarin Sodium  Buff-a-Comp Enoxaparin Orudis Zorpin  Buff-a-Comp with Codeine Equegesic Os-Cal-Gesic   Buffaprin Excedrin plain, buffered or Extra Strength  Oxalid   Bufferin Arthritis Strength Feldene Oxphenbutazone   Bufferin plain or Extra Strength Feldene Capsules Oxycodone with Aspirin   Bufferin with Codeine Fenoprofen Fenoprofen Pabalate or Pabalate-SF   Buffets II Flogesic Panagesic   Buffinol plain or Extra Strength Florinal or Florinal with Codeine Panwarfarin   Buf-Tabs Flurbiprofen Penicillamine   Butalbital Compound Four-way cold tablets Penicillin   Butazolidin Fragmin Pepto-Bismol   Carbenicillin Geminisyn Percodan   Carna Arthritis Reliever Geopen Persantine   Carprofen Gold's salt Persistin   Chloramphenicol Goody's Phenylbutazone   Chloromycetin Haltrain Piroxlcam   Clmetidine heparin Plaquenil   Cllnoril Hyco-pap Ponstel   Clofibrate Hydroxy chloroquine Propoxyphen         Before stopping any of these medications, be sure to consult the physician who ordered them.  Some, such as Coumadin (Warfarin) are ordered to prevent or treat serious conditions such as "deep thrombosis", "pumonary embolisms", and other heart problems.  The amount of time that you may need off of the medication may also vary with the medication and the reason for which you were taking it.  If you are taking any of these medications, please make sure you notify your pain physician before you undergo any  procedures.  Hydrocodone prescription given

## 2014-12-05 ENCOUNTER — Telehealth: Payer: Self-pay | Admitting: *Deleted

## 2014-12-05 NOTE — Telephone Encounter (Signed)
Denies complications post procedure. 

## 2014-12-26 ENCOUNTER — Ambulatory Visit (INDEPENDENT_AMBULATORY_CARE_PROVIDER_SITE_OTHER): Payer: BLUE CROSS/BLUE SHIELD | Admitting: *Deleted

## 2014-12-26 ENCOUNTER — Encounter: Payer: Self-pay | Admitting: Internal Medicine

## 2014-12-26 DIAGNOSIS — I429 Cardiomyopathy, unspecified: Secondary | ICD-10-CM | POA: Diagnosis not present

## 2014-12-26 DIAGNOSIS — I428 Other cardiomyopathies: Secondary | ICD-10-CM

## 2014-12-29 NOTE — Progress Notes (Signed)
Remote pacemaker transmission.   

## 2015-01-02 ENCOUNTER — Encounter: Payer: Self-pay | Admitting: Pain Medicine

## 2015-01-02 ENCOUNTER — Ambulatory Visit: Payer: BLUE CROSS/BLUE SHIELD | Attending: Pain Medicine | Admitting: Pain Medicine

## 2015-01-02 VITALS — BP 124/77 | HR 78 | Temp 98.1°F | Resp 16 | Ht 62.0 in | Wt 174.0 lb

## 2015-01-02 DIAGNOSIS — M5481 Occipital neuralgia: Secondary | ICD-10-CM | POA: Diagnosis not present

## 2015-01-02 DIAGNOSIS — M47812 Spondylosis without myelopathy or radiculopathy, cervical region: Secondary | ICD-10-CM

## 2015-01-02 DIAGNOSIS — M5030 Other cervical disc degeneration, unspecified cervical region: Secondary | ICD-10-CM | POA: Diagnosis not present

## 2015-01-02 DIAGNOSIS — M533 Sacrococcygeal disorders, not elsewhere classified: Secondary | ICD-10-CM | POA: Insufficient documentation

## 2015-01-02 DIAGNOSIS — Z981 Arthrodesis status: Secondary | ICD-10-CM | POA: Diagnosis not present

## 2015-01-02 DIAGNOSIS — M47816 Spondylosis without myelopathy or radiculopathy, lumbar region: Secondary | ICD-10-CM | POA: Insufficient documentation

## 2015-01-02 DIAGNOSIS — M542 Cervicalgia: Secondary | ICD-10-CM | POA: Diagnosis present

## 2015-01-02 DIAGNOSIS — R51 Headache: Secondary | ICD-10-CM | POA: Diagnosis not present

## 2015-01-02 DIAGNOSIS — M545 Low back pain: Secondary | ICD-10-CM | POA: Diagnosis present

## 2015-01-02 DIAGNOSIS — M5136 Other intervertebral disc degeneration, lumbar region: Secondary | ICD-10-CM | POA: Diagnosis not present

## 2015-01-02 MED ORDER — HYDROCODONE-ACETAMINOPHEN 5-325 MG PO TABS: ORAL_TABLET | ORAL | Status: DC

## 2015-01-02 NOTE — Patient Instructions (Signed)
PLAN   Continue present medication Hydrocodone acetaminophen  F/U PCP  Dr.Tullo  for evaliation of  BP and general medical  condition  F/U surgical evaluation. May consider pending follow-up evaluations  F/U neurological evaluation. May consider pending follow-up evaluations  May consider radiofrequency rhizolysis or intraspinal procedures pending response to present treatment and F/U evaluation   Patient to call Pain Management Center should patient have concerns prior to scheduled return appointment.

## 2015-01-02 NOTE — Progress Notes (Signed)
Safety precautions to be maintained throughout the outpatient stay will include: orient to surroundings, keep bed in low position, maintain call bell within reach at all times, provide assistance with transfer out of bed and ambulation.  

## 2015-01-02 NOTE — Progress Notes (Signed)
   Subjective:    Patient ID: Julia Pierce, female    DOB: 11/02/1955, 59 y.o.   MRN: [id number]  HPI  Patient is 59 year old female returns to pain meds further evaluation and treatment of pain involving neck headaches as well as upper mid and lower back lower extremity region. Patient states that she has had some headaches which she states appears to be associated with pain involving the region of the neck and upper extremity region. Patient continues to limp and states that she is tolerating medications hydrocodone acetaminophen fairly well at this time  . We will remain available to consider patient interventional treatment. Patient is to call Pain Management Center should she wish to proceed with interventional treatment have other concerns regarding her condition prior to scheduled return appointment patient agrees to treatment plan.     Review of Systems     Objective:   Physical Exam  There is moderate tenderness of the splenius capitis and occipitalis region. There was limited range of motion of the cervical spine. There was moderate to moderately severe tenderness to palpation of the trapezius of the scapula and rhomboid musculature region. There was minimal tenderness of the acromioclavicular glenohumeral joint region. There was question decreased grip strength. Tinel and Phalen's maneuver were without increase of pain of significant degree. There appeared to be unremarkable Spurling's maneuver. There was tends to palpation thoracic facet thoracic paraspinal muscles region. No crepitus of the thoracic region was noted. Palpation over the lumbar paraspinal musculature region lumbar facet region associated with mild to moderate discomfort. Lateral bending rotation and extension palpation lumbar facets reduce mild to moderate discomfort. Straight leg raising tolerated to 20 no increased pain with dorsiflexion noted. Negative clonus negative Homans. DTRs difficult to elicit patient had  difficulty relaxing. Mental tenderness to palpation of the greater trochanteric region iliotibial band region.there was negative clonus negative Homans. There was mild tennis over the PSIS and PII S region. Abdomen nontender with no costovertebral maintenance noted.     Assessment & Plan:      Degenerative disc disease cervical spine  Degenerative changes C2-3, C3-4, C4-5, and C5-6, with multilevel degenerative changes of the cervical spine  Status post cervical fusion   Bilateral occipital neuralgia   Cervicogenic headache   Sacroiliac joint dysfunction   Degenerative disc disease lumbar spine  L3-4 L4-5 and L5-S1 degenerative changes  Lumbar facet syndrome   PLAN   Continue present medicationHydrocodone acetaminophen  F/U PCP  Dr.Tullo for evaliation of  BP and general medical  condition  F/U surgical evaluation. May consider pending follow-up evaluations  F/U neurological evaluation. May consider pending follow-up evaluations  May consider radiofrequency rhizolysis or intraspinal procedures pending response to present treatment and F/U evaluation   Patient to call Pain Management Center should patient have concerns prior to scheduled return appointment.

## 2015-01-06 LAB — CUP PACEART REMOTE DEVICE CHECK
Brady Statistic AP VS Percent: 0.1 % — CL
Brady Statistic AS VS Percent: 0.1 %
Date Time Interrogation Session: 20160917152934
Lead Channel Impedance Value: 494 Ohm
Lead Channel Sensing Intrinsic Amplitude: 0.9 mV
Lead Channel Sensing Intrinsic Amplitude: 20 mV
MDC IDC MSMT LEADCHNL LV IMPEDANCE VALUE: 570 Ohm
MDC IDC MSMT LEADCHNL RA IMPEDANCE VALUE: 399 Ohm
MDC IDC MSMT LEADCHNL RA PACING THRESHOLD AMPLITUDE: 0.5 V
MDC IDC MSMT LEADCHNL RA PACING THRESHOLD PULSEWIDTH: 0.4 ms
MDC IDC MSMT LEADCHNL RV PACING THRESHOLD AMPLITUDE: 0.75 V
MDC IDC MSMT LEADCHNL RV PACING THRESHOLD PULSEWIDTH: 0.4 ms
MDC IDC SET LEADCHNL LV PACING AMPLITUDE: 2.5 V
MDC IDC SET LEADCHNL LV PACING PULSEWIDTH: 0.8 ms
MDC IDC SET LEADCHNL RA PACING AMPLITUDE: 1.5 V
MDC IDC SET LEADCHNL RV PACING AMPLITUDE: 2 V
MDC IDC SET LEADCHNL RV PACING PULSEWIDTH: 0.4 ms
MDC IDC SET LEADCHNL RV SENSING SENSITIVITY: 0.9 mV
MDC IDC SET ZONE DETECTION INTERVAL: 350 ms
MDC IDC SET ZONE DETECTION INTERVAL: 400 ms
MDC IDC STAT BRADY AP VP PERCENT: 0.3 %
MDC IDC STAT BRADY AS VP PERCENT: 99.5 %

## 2015-01-19 ENCOUNTER — Encounter: Payer: Self-pay | Admitting: Cardiology

## 2015-01-30 ENCOUNTER — Ambulatory Visit: Payer: BLUE CROSS/BLUE SHIELD | Attending: Pain Medicine | Admitting: Pain Medicine

## 2015-01-30 ENCOUNTER — Encounter: Payer: Self-pay | Admitting: Pain Medicine

## 2015-01-30 VITALS — BP 120/70 | HR 74 | Temp 97.9°F | Resp 18 | Ht 62.0 in | Wt 174.0 lb

## 2015-01-30 DIAGNOSIS — Z981 Arthrodesis status: Secondary | ICD-10-CM | POA: Insufficient documentation

## 2015-01-30 DIAGNOSIS — M533 Sacrococcygeal disorders, not elsewhere classified: Secondary | ICD-10-CM | POA: Diagnosis not present

## 2015-01-30 DIAGNOSIS — M5481 Occipital neuralgia: Secondary | ICD-10-CM

## 2015-01-30 DIAGNOSIS — M7062 Trochanteric bursitis, left hip: Secondary | ICD-10-CM

## 2015-01-30 DIAGNOSIS — M5030 Other cervical disc degeneration, unspecified cervical region: Secondary | ICD-10-CM

## 2015-01-30 DIAGNOSIS — M47812 Spondylosis without myelopathy or radiculopathy, cervical region: Secondary | ICD-10-CM | POA: Diagnosis not present

## 2015-01-30 DIAGNOSIS — M47816 Spondylosis without myelopathy or radiculopathy, lumbar region: Secondary | ICD-10-CM | POA: Diagnosis not present

## 2015-01-30 DIAGNOSIS — M5136 Other intervertebral disc degeneration, lumbar region: Secondary | ICD-10-CM | POA: Insufficient documentation

## 2015-01-30 DIAGNOSIS — M7061 Trochanteric bursitis, right hip: Secondary | ICD-10-CM

## 2015-01-30 DIAGNOSIS — M542 Cervicalgia: Secondary | ICD-10-CM | POA: Diagnosis present

## 2015-01-30 DIAGNOSIS — R51 Headache: Secondary | ICD-10-CM | POA: Diagnosis not present

## 2015-01-30 MED ORDER — HYDROCODONE-ACETAMINOPHEN 5-325 MG PO TABS: ORAL_TABLET | ORAL | Status: DC

## 2015-01-30 NOTE — Progress Notes (Signed)
   Subjective:    Patient ID: Julia Pierce, female    DOB: [birth year], 59 y.o.   MRN: [id number]  HPI Patient 59 year old female returns to pain management Center for further evaluation and treatment of pain involving the neck headaches upper mid and lower back lower extremity region. Patient states she has some spasm occurring in the upper back region Region without significant headaches at this time. Patient is continuing to swim and continue present medication hydrocodone acetaminophen. We discussed interventional treatment which we will avoid at this time and will continue to observe patient's response to present treatment. Patient will call pain management should there be increased spasms without any change in her condition at this time we will consider modification of treatment regimen including interventional treatment. The patient was with understanding and in agreement with suggested treatment plan.   Review of Systems     Objective:   Physical Exam  There was tenderness to palpation of the spinous Occipitalis musculature region of mild degree to moderate degree on the left compared to the right with talc palpation over the region of the cervical facet cervical paraspinal musculature region reproducing mild/moderate discomfort. There was mild to moderate tends to palpation over the thoracic facet thoracic paraspinal musculature region with no crepitus of the thoracic region noted. Patient appeared to be with bilaterally equal grip strength. Tinel and Phalen's maneuver were without increase of pain of significant degree. Palpation of the thoracic facet thoracic paraspinal musculature region was a tends to palpation with evidence of muscle spasms occurring in the upper and mid thoracic region of moderate to moderately severe degree. There was no crepitus of the thoracic region noted. Palpation over the lumbar paraspinal muscle lumbar facet region associated with moderate discomfort. Lateral  bending and rotation associated with moderate discomfort palpation of the PSIS PSIS region gluteal and piriformis musculature region associated with mild to moderate discomfort. Straight leg raising was tolerates approximately 20 without increased pain with dorsiflexion noted. DTRs appear to be trace at the knees  No sensory deficit of dermatomal dispute detected There was negative clonus negative Homans   Abdomen nontender with no costovertebral angle tenderness noted.     Assessment & Plan:     Degenerative disc disease cervical spine  Degenerative changes C2-3, C3-4, C4-5, and C5-6, with multilevel degenerative changes of the cervical spine  Status post cervical fusion   Bilateral occipital neuralgia   Cervicogenic headache   Sacroiliac joint dysfunction   Degenerative disc disease lumbar spine  L3-4 L4-5 and L5-S1 degenerative changes  Lumbar facet syndrome   PLAN   Continue present medication hydrocodone acetaminophen  F/U PCP  Tullo for evaliation of  BP and general medical  condition  F/U surgical evaluation. May consider pending follow-up evaluations  F/U neurological evaluation. May consider pending follow-up evaluations  May consider radiofrequency rhizolysis or intraspinal procedures pending response to present treatment and F/U evaluation   Patient to call Pain Management Center should patient have concerns prior to scheduled return appointment.

## 2015-01-30 NOTE — Progress Notes (Signed)
Safety precautions to be maintained throughout the outpatient stay will include: orient to surroundings, keep bed in low position, maintain call bell within reach at all times, provide assistance with transfer out of bed and ambulation.  

## 2015-01-30 NOTE — Patient Instructions (Signed)
PLAN   Continue present medication hydrocodone acetaminophen  F/U PCP  Dr Derrel Nip for evaliation of  BP and general medical  condition  F/U surgical evaluation. May consider pending follow-up evaluations  F/U neurological evaluation. May consider pending follow-up evaluations  May consider radiofrequency rhizolysis or intraspinal procedures pending response to present treatment and F/U evaluation   Patient to call Pain Management Center should patient have concerns prior to scheduled return appointment.

## 2015-02-13 ENCOUNTER — Other Ambulatory Visit: Payer: Self-pay | Admitting: Internal Medicine

## 2015-02-20 ENCOUNTER — Other Ambulatory Visit: Payer: Self-pay | Admitting: Pain Medicine

## 2015-02-27 ENCOUNTER — Encounter: Payer: Self-pay | Admitting: Pain Medicine

## 2015-02-27 ENCOUNTER — Ambulatory Visit: Payer: BLUE CROSS/BLUE SHIELD | Attending: Pain Medicine | Admitting: Pain Medicine

## 2015-02-27 VITALS — BP 130/74 | HR 81 | Temp 97.7°F | Resp 18 | Ht 62.5 in | Wt 174.0 lb

## 2015-02-27 DIAGNOSIS — M542 Cervicalgia: Secondary | ICD-10-CM | POA: Diagnosis present

## 2015-02-27 DIAGNOSIS — M5481 Occipital neuralgia: Secondary | ICD-10-CM | POA: Diagnosis not present

## 2015-02-27 DIAGNOSIS — Z981 Arthrodesis status: Secondary | ICD-10-CM | POA: Insufficient documentation

## 2015-02-27 DIAGNOSIS — M533 Sacrococcygeal disorders, not elsewhere classified: Secondary | ICD-10-CM | POA: Insufficient documentation

## 2015-02-27 DIAGNOSIS — M5136 Other intervertebral disc degeneration, lumbar region: Secondary | ICD-10-CM | POA: Diagnosis not present

## 2015-02-27 DIAGNOSIS — M5010 Cervical disc disorder with radiculopathy, unspecified cervical region: Secondary | ICD-10-CM

## 2015-02-27 DIAGNOSIS — M5030 Other cervical disc degeneration, unspecified cervical region: Secondary | ICD-10-CM

## 2015-02-27 DIAGNOSIS — M47812 Spondylosis without myelopathy or radiculopathy, cervical region: Secondary | ICD-10-CM | POA: Diagnosis not present

## 2015-02-27 DIAGNOSIS — M7061 Trochanteric bursitis, right hip: Secondary | ICD-10-CM

## 2015-02-27 DIAGNOSIS — R51 Headache: Secondary | ICD-10-CM | POA: Insufficient documentation

## 2015-02-27 DIAGNOSIS — M7062 Trochanteric bursitis, left hip: Secondary | ICD-10-CM

## 2015-02-27 DIAGNOSIS — M47816 Spondylosis without myelopathy or radiculopathy, lumbar region: Secondary | ICD-10-CM

## 2015-02-27 DIAGNOSIS — M546 Pain in thoracic spine: Secondary | ICD-10-CM | POA: Diagnosis present

## 2015-02-27 MED ORDER — HYDROCODONE-ACETAMINOPHEN 5-325 MG PO TABS: ORAL_TABLET | ORAL | Status: DC

## 2015-02-27 NOTE — Progress Notes (Signed)
Safety precautions to be maintained throughout the outpatient stay will include: orient to surroundings, keep bed in low position, maintain call bell within reach at all times, provide assistance with transfer out of bed and ambulation.  

## 2015-02-27 NOTE — Patient Instructions (Addendum)
PLAN   Continue present medication hydrocodone acetaminophen  Cervical epidural steroid injection to be performed at time of return appointment  F/U PCP  Dr Derrel Nip for evaliation of  BP and general medical  condition  F/U surgical evaluation. May consider pending follow-up evaluations  F/U neurological evaluation. May consider pending follow-up evaluations  May consider radiofrequency rhizolysis or intraspinal procedures pending response to present treatment and F/U evaluation   Patient to call Pain Management Center should patient have concerns prior to scheduled return appointment.   Epidural Steroid Injection Patient Information  Description: The epidural space surrounds the nerves as they exit the spinal cord.  In some patients, the nerves can be compressed and inflamed by a bulging disc or a tight spinal canal (spinal stenosis).  By injecting steroids into the epidural space, we can bring irritated nerves into direct contact with a potentially helpful medication.  These steroids act directly on the irritated nerves and can reduce swelling and inflammation which often leads to decreased pain.  Epidural steroids may be injected anywhere along the spine and from the neck to the low back depending upon the location of your pain.   After numbing the skin with local anesthetic (like Novocaine), a small needle is passed into the epidural space slowly.  You may experience a sensation of pressure while this is being done.  The entire block usually last less than 10 minutes.  Conditions which may be treated by epidural steroids:   Low back and leg pain  Neck and arm pain  Spinal stenosis  Post-laminectomy syndrome  Herpes zoster (shingles) pain  Pain from compression fractures  Preparation for the injection:  1. Do not eat any solid food or dairy products within 6 hours of your appointment.  2. You may drink clear liquids up to 2 hours before appointment.  Clear liquids include  water, black coffee, juice or soda.  No milk or cream please. 3. You may take your regular medication, including pain medications, with a sip of water before your appointment  Diabetics should hold regular insulin (if taken separately) and take 1/2 normal NPH dos the morning of the procedure.  Carry some sugar containing items with you to your appointment. 4. A driver must accompany you and be prepared to drive you home after your procedure.  5. Bring all your current medications with your. 6. An IV may be inserted and sedation may be given at the discretion of the physician.   7. A blood pressure cuff, EKG and other monitors will often be applied during the procedure.  Some patients may need to have extra oxygen administered for a short period. 8. You will be asked to provide medical information, including your allergies, prior to the procedure.  We must know immediately if you are taking blood thinners (like Coumadin/Warfarin)  Or if you are allergic to IV iodine contrast (dye). We must know if you could possible be pregnant.  Possible side-effects:  Bleeding from needle site  Infection (rare, may require surgery)  Nerve injury (rare)  Numbness & tingling (temporary)  Difficulty urinating (rare, temporary)  Spinal headache ( a headache worse with upright posture)  Light -headedness (temporary)  Pain at injection site (several days)  Decreased blood pressure (temporary)  Weakness in arm/leg (temporary)  Pressure sensation in back/neck (temporary)  Call if you experience:  Fever/chills associated with headache or increased back/neck pain.  Headache worsened by an upright position.  New onset weakness or numbness of an extremity below the  injection site  Hives or difficulty breathing (go to the emergency room)  Inflammation or drainage at the infection site  Severe back/neck pain  Any new symptoms which are concerning to you  Please note:  Although the local  anesthetic injected can often make your back or neck feel good for several hours after the injection, the pain will likely return.  It takes 3-7 days for steroids to work in the epidural space.  You may not notice any pain relief for at least that one week.  If effective, we will often do a series of three injections spaced 3-6 weeks apart to maximally decrease your pain.  After the initial series, we generally will wait several months before considering a repeat injection of the same type.  If you have any questions, please call [phone] [facility]

## 2015-02-27 NOTE — Progress Notes (Signed)
   Subjective:    Patient ID: Ahnyla Mendel, female    DOB: 1955-10-21, 59 y.o.   MRN: [id number]  HPI  The patient is a 59 year old female who returns to pain management Center for further evaluation and treatment of pain involving the neck and back upper and lower extremity regions. Patient states that she has had some progressive numbness of the upper extremities with pain occurring in the region of the neck. Patient is without any trauma or change events of daily living the cost change in symptomatology. We have previously discussed patient undergoing surgical evaluation. Patient has undergone prior surgical evaluation at the present time wishes to proceed with cervical epidural steroid injection at time return appointment in an attempt to decrease severity of symptoms, minimize progression of symptoms, and avoid the need for more involved treatment. The patient was in agreement with suggested treatment plan.      Review of Systems     Objective:   Physical Exam There was tenderness to palpation of the splenius capitis and a separate talus musculature regions. Palpation of these regions reproduces moderate to severe discomfort. There appeared to be unremarkable Spurling's maneuver. Patient appeared to be decreased grip strength. Tinel and Phalen maneuvers without increased pain of significant degree. There was tenderness to palpation over the thoracic facet thoracic paraspinal musculature regions. Palpation over the lumbar paraspinal muscles lumbar facet region reproduces moderate discomfort. Lateral bending rotation extension and palpation of the lumbar facets reproducing moderate discomfort. There is moderate tenderness to palpation of the PSIS and PI IS regions. No sensory deficit or dermatomal distribution was detected. There was negative clonus negative Homans. Abdomen was nontender with no costovertebral tenderness noted.       Assessment & Plan:     Degenerative disc disease  cervical spine  Degenerative changes C2-3, C3-4, C4-5, and C5-6, with multilevel degenerative changes of the cervical spine  Status post cervical fusion   Bilateral occipital neuralgia   Cervicogenic headache   Sacroiliac joint dysfunction   Degenerative disc disease lumbar spine  L3-4 L4-5 and L5-S1 degenerative changes  Lumbar facet syndrome    PLAN   Continue present medication hydrocodone acetaminophen  Cervical epidural steroid injection to be performed at time return appointment is discussed  F/U PCP  T[facility] for evaliation of  BP and general medical  condition  F/U surgical evaluation. Patient will undergo further surgical evaluation as discussed  F/U neurological evaluation. May consider pending follow-up evaluations  May consider radiofrequency rhizolysis or intraspinal procedures pending response to present treatment and F/U evaluation   Patient to call Pain Management Center should patient have concerns prior to scheduled return appointment.

## 2015-03-22 ENCOUNTER — Encounter: Payer: Self-pay | Admitting: Internal Medicine

## 2015-03-22 ENCOUNTER — Ambulatory Visit (INDEPENDENT_AMBULATORY_CARE_PROVIDER_SITE_OTHER): Payer: BLUE CROSS/BLUE SHIELD | Admitting: Internal Medicine

## 2015-03-22 VITALS — BP 140/82 | HR 78 | Ht 62.5 in | Wt 178.6 lb

## 2015-03-22 DIAGNOSIS — I4581 Long QT syndrome: Secondary | ICD-10-CM | POA: Diagnosis not present

## 2015-03-22 DIAGNOSIS — R55 Syncope and collapse: Secondary | ICD-10-CM

## 2015-03-22 DIAGNOSIS — Z950 Presence of cardiac pacemaker: Secondary | ICD-10-CM

## 2015-03-22 DIAGNOSIS — I5022 Chronic systolic (congestive) heart failure: Secondary | ICD-10-CM | POA: Diagnosis not present

## 2015-03-22 DIAGNOSIS — I10 Essential (primary) hypertension: Secondary | ICD-10-CM | POA: Diagnosis not present

## 2015-03-22 NOTE — Assessment & Plan Note (Signed)
Her medtronic BiV PM is working normally. Will recheck in several months 

## 2015-03-22 NOTE — Patient Instructions (Signed)
Medication Instructions: - no changes  Labwork: - none  Procedures/Testing: - none  Follow-Up: - Remote monitoring is used to monitor your Pacemaker of ICD from home. This monitoring reduces the number of office visits required to check your device to one time per year. It allows [place] to keep an eye on the functioning of your device to ensure it is working properly. You are scheduled for a device check from home on 06/21/15. You may send your transmission at any time that day. If you have a wireless device, the transmission will be sent automatically. After your physician reviews your transmission, you will receive a postcard with your next transmission date.  - Your physician wants you to follow-up in: 1 year with [name]. You will receive a reminder letter in the mail two months in advance. If you don't receive a letter, please call our office to schedule the follow-up appointment.  Any Additional Special Instructions Will Be Listed Below (If Applicable).

## 2015-03-22 NOTE — Assessment & Plan Note (Signed)
Her blood pressure is reasonably well controlled.

## 2015-03-22 NOTE — Progress Notes (Signed)
HPI Mrs. Dilts returns today for followup. She is a very pleasant 59 yo woman with chronic LBBB, chronic systolic heart failure, and syncope, s/p insertion of a BiV PPM. She has a nonischemic cardiomyopathy. In the interim she has done well. No recurrent syncope. No chest pain or sob or peripheral edema. She remains active swimming. Allergies  Allergen Reactions  . Cephalexin Anaphylaxis  . Penicillin G Hives, Shortness Of Breath and Swelling  . Penicillins Hives, Shortness Of Breath and Swelling  . Atorvastatin     myalgias  . Cymbalta [Duloxetine Hcl] Other (See Comments)  . Duloxetine     Other reaction(s): Muscle Pain  . Statins     Severe myalgias  . Latex Rash     Current Outpatient Prescriptions  Medication Sig Dispense Refill  . aspirin EC 81 MG tablet Take 81 mg by mouth daily.    . Azelaic Acid (FINACEA EX) Apply 1 application topically 2 (two) times daily.    . B Complex Vitamins (B COMPLEX PO) Take 1 tablet by mouth daily.     . calcium carbonate (OS-CAL) 600 MG TABS Take 600 mg by mouth daily.      . Cholecalciferol (VITAMIN D3) 2000 UNITS TABS Take 1 tablet by mouth daily.    . colchicine 0.6 MG tablet Take 0.6 mg by mouth 2 (two) times daily as needed (Gout).    . DULoxetine (CYMBALTA) 30 MG capsule Take 1 capsule (30 mg total) by mouth daily. 39 capsule 3  . esomeprazole (NEXIUM) 20 MG capsule Take 40 mg by mouth daily at 12 noon.    . fluticasone (FLONASE) 50 MCG/ACT nasal spray Place 2 sprays into both nostrils daily.    Marland Kitchen HYDROcodone-acetaminophen (NORCO/VICODIN) 5-325 MG tablet Limit 1-3 tabs by mouth daily if tolerated 80 tablet 0  . losartan-hydrochlorothiazide (HYZAAR) 50-12.5 MG per tablet TAKE 1 TABLET BY MOUTH DAILY. 90 tablet 1  . methocarbamol (ROBAXIN) 500 MG tablet TAKE 1 TABLET BY MOUTH EVERY 8 HOURS AS NEEDED FOR MUSCLE SPASMS.Marland KitchenFILL ON 07-06-13 270 tablet 2  . metoprolol tartrate (LOPRESSOR) 25 MG tablet TAKE 1/2 TABLET BY MOUTH TWICE A DAY      . Multiple Vitamin (MULTIVITAMIN) tablet Take 1 tablet by mouth daily.      . Omega-3 Fatty Acids (FISH OIL) 1000 MG CAPS Take 1 capsule by mouth 2 (two) times daily.    . sertraline (ZOLOFT) 100 MG tablet TAKE 1 TABLET BY MOUTH EVERY DAY 90 tablet 1  . sertraline (ZOLOFT) 100 MG tablet TAKE 1 TABLET BY MOUTH EVERY DAY 90 tablet 0  . spironolactone (ALDACTONE) 25 MG tablet Take 25 mg by mouth daily.    Marland Kitchen tolterodine (DETROL) 2 MG tablet Take 2 mg by mouth 2 (two) times daily.     No current facility-administered medications for this visit.     Past Medical History  Diagnosis Date  . Arthritis   . Chicken pox   . Headache, frequent episodic tension-type   . Cardiac arrhythmia due to congenital heart disease   . History of high blood pressure     readings  . High cholesterol   . Migraines   . Hx of colonoscopy sept 2012    normal,  next due 2022, [name]  . Hypertension   . Depression   . Holter monitor, abnormal August 2012    done for long QT.  some contractile asynchrony [name])  . Morton's neuroma     bilateral  .  Syncope     ROS:   All systems reviewed and negative except as noted in the HPI.   Past Surgical History  Procedure Laterality Date  . Appendectomy  1973  . Tonsillectomy and adenoidectomy  1966  . Spinal fusion  03/2007    ruptured disc  L5  [name]  . Hysteroscopy  2005    heavy bleeding  . Abdominal hysterectomy  Dec 2013    [place]  . Bi-ventricular pacemaker insertion (crt-p)  05/2013    MDT CRTP implanted by Dr Lovena Le for cardiomyopathy, LBBB, and syncope  . Implantable cardioverter defibrillator implant N/A 06/02/2013    Procedure: IMPLANTABLE CARDIOVERTER DEFIBRILLATOR IMPLANT;  Surgeon: Evans Lance, MD;  Location: [facility] CATH LAB;  Service: Cardiovascular;  Laterality: N/A;     Family History  Problem Relation Age of Onset  . Arthritis Mother   . Hyperlipidemia Mother   . Cirrhosis Mother   . Arthritis Father   . Cancer Father      prostate cancer   . Hyperlipidemia Father   . Pulmonary embolism Father 59  . Alzheimer's disease Father   . Cancer Other     ovarian,uterus  . Heart disease Other   . Stroke Other   . Learning disabilities Other   . Coronary artery disease Paternal Grandfather 68     Social History   Social History  . Marital Status: Married    Spouse Name: N/A  . Number of Children: N/A  . Years of Education: N/A   Occupational History  . Not on file.   Social History Main Topics  . Smoking status: Never Smoker   . Smokeless tobacco: Never Used  . Alcohol Use: No  . Drug Use: No  . Sexual Activity: Not on file   Other Topics Concern  . Not on file   Social History Narrative     BP 140/82 mmHg  Pulse 78  Ht 5' 2.5" (1.588 m)  Wt 178 lb 9.6 oz (81.012 kg)  BMI 32.13 kg/m2  Physical Exam:  Well appearing obese, midde aged woman, NAD HEENT: Unremarkable Neck:  7 cm JVD, no thyromegally Back:  No CVA tenderness Lungs:  Clear with no wheezes, well healed PPM incision. HEART:  Regular rate rhythm, no murmurs, no rubs, no clicks Abd:  soft, positive bowel sounds, no organomegally, no rebound, no guarding Ext:  2 plus pulses, no edema, no cyanosis, no clubbing Skin:  No rashes no nodules Neuro:  CN II through XII intact, motor grossly intact  ECG - sinus rhythm with biventricular pacing  DEVICE  Normal device function.  See PaceArt for details.   Assess/Plan:

## 2015-03-22 NOTE — Assessment & Plan Note (Signed)
Her heart failure symptoms are well controlled. Will continue her current meds. She is encouraged to continue her low sodium diet.

## 2015-03-28 ENCOUNTER — Encounter: Payer: Self-pay | Admitting: Pain Medicine

## 2015-03-28 ENCOUNTER — Ambulatory Visit: Payer: BLUE CROSS/BLUE SHIELD | Attending: Pain Medicine | Admitting: Pain Medicine

## 2015-03-28 VITALS — BP 150/70 | HR 70 | Temp 97.1°F | Resp 16 | Ht 65.0 in | Wt 176.0 lb

## 2015-03-28 DIAGNOSIS — M5481 Occipital neuralgia: Secondary | ICD-10-CM

## 2015-03-28 DIAGNOSIS — M7061 Trochanteric bursitis, right hip: Secondary | ICD-10-CM

## 2015-03-28 DIAGNOSIS — M533 Sacrococcygeal disorders, not elsewhere classified: Secondary | ICD-10-CM

## 2015-03-28 DIAGNOSIS — M5030 Other cervical disc degeneration, unspecified cervical region: Secondary | ICD-10-CM | POA: Diagnosis not present

## 2015-03-28 DIAGNOSIS — M79602 Pain in left arm: Secondary | ICD-10-CM | POA: Diagnosis present

## 2015-03-28 DIAGNOSIS — M542 Cervicalgia: Secondary | ICD-10-CM | POA: Insufficient documentation

## 2015-03-28 DIAGNOSIS — M5010 Cervical disc disorder with radiculopathy, unspecified cervical region: Secondary | ICD-10-CM

## 2015-03-28 DIAGNOSIS — M5136 Other intervertebral disc degeneration, lumbar region: Secondary | ICD-10-CM

## 2015-03-28 DIAGNOSIS — Z981 Arthrodesis status: Secondary | ICD-10-CM | POA: Diagnosis not present

## 2015-03-28 DIAGNOSIS — M7062 Trochanteric bursitis, left hip: Secondary | ICD-10-CM

## 2015-03-28 DIAGNOSIS — M79601 Pain in right arm: Secondary | ICD-10-CM | POA: Diagnosis present

## 2015-03-28 DIAGNOSIS — M47816 Spondylosis without myelopathy or radiculopathy, lumbar region: Secondary | ICD-10-CM

## 2015-03-28 DIAGNOSIS — M47812 Spondylosis without myelopathy or radiculopathy, cervical region: Secondary | ICD-10-CM | POA: Insufficient documentation

## 2015-03-28 MED ORDER — FENTANYL CITRATE (PF) 100 MCG/2ML IJ SOLN: 100.0000 ug | Freq: Once | INTRAMUSCULAR | Status: DC

## 2015-03-28 MED ORDER — CIPROFLOXACIN IN D5W 400 MG/200ML IV SOLN: 400.0000 mg | Freq: Once | INTRAVENOUS | Status: DC

## 2015-03-28 MED ORDER — LACTATED RINGERS IV SOLN: 1000.0000 mL | INTRAVENOUS | Status: DC

## 2015-03-28 MED ORDER — TRIAMCINOLONE ACETONIDE 40 MG/ML IJ SUSP: 40.0000 mg | Freq: Once | INTRAMUSCULAR | Status: AC

## 2015-03-28 MED ORDER — SODIUM CHLORIDE 0.9 % IJ SOLN
INTRAMUSCULAR | Status: AC
  Administered 2015-03-28: 09:00:00
  Filled 2015-03-28: qty 10

## 2015-03-28 MED ORDER — LIDOCAINE HCL (PF) 1 % IJ SOLN
INTRAMUSCULAR | Status: AC
  Filled 2015-03-28: qty 5

## 2015-03-28 MED ORDER — LIDOCAINE HCL (PF) 1 % IJ SOLN: 10.0000 mL | Freq: Once | INTRAMUSCULAR | Status: DC

## 2015-03-28 MED ORDER — BUPIVACAINE HCL (PF) 0.25 % IJ SOLN: 30.0000 mL | Freq: Once | INTRAMUSCULAR | Status: DC

## 2015-03-28 MED ORDER — SODIUM CHLORIDE 0.9 % IJ SOLN: 20.0000 mL | Freq: Once | INTRAMUSCULAR | Status: AC

## 2015-03-28 MED ORDER — ORPHENADRINE CITRATE 30 MG/ML IJ SOLN
INTRAMUSCULAR | Status: AC
  Administered 2015-03-28: 09:00:00
  Filled 2015-03-28: qty 2

## 2015-03-28 MED ORDER — BUPIVACAINE HCL (PF) 0.25 % IJ SOLN
INTRAMUSCULAR | Status: AC
  Filled 2015-03-28: qty 30

## 2015-03-28 MED ORDER — TRIAMCINOLONE ACETONIDE 40 MG/ML IJ SUSP
INTRAMUSCULAR | Status: AC
  Administered 2015-03-28: 08:00:00
  Filled 2015-03-28: qty 1

## 2015-03-28 MED ORDER — CIPROFLOXACIN IN D5W 400 MG/200ML IV SOLN
INTRAVENOUS | Status: AC
  Administered 2015-03-28: 08:00:00 via INTRAVENOUS
  Filled 2015-03-28: qty 200

## 2015-03-28 MED ORDER — MIDAZOLAM HCL 5 MG/5ML IJ SOLN: 5.0000 mg | Freq: Once | INTRAMUSCULAR | Status: AC

## 2015-03-28 MED ORDER — CIPROFLOXACIN HCL 250 MG PO TABS: 250.0000 mg | ORAL_TABLET | Freq: Two times a day (BID) | ORAL | Status: DC

## 2015-03-28 MED ORDER — ORPHENADRINE CITRATE 30 MG/ML IJ SOLN: 60.0000 mg | Freq: Once | INTRAMUSCULAR | Status: AC

## 2015-03-28 NOTE — Progress Notes (Signed)
   Subjective:    Patient ID: Julia Pierce, female    DOB: February 26, 1956, 59 y.o.   MRN: [id number]  HPI  PROCEDURE PERFORMED: Cervical epidural steroid injection   NOTE: The patient is a 59 year old female who returns to [place] for further evaluation and prescription of pain involving the neck and upper extremity region. MRI revealed the patient to be with evidence of  Degenerative disc disease cervical spine  Degenerative changes C2-3, C3-4, C4-5, and C5-6, with multilevel degenerative changes of the cervical spine  Status post cervical fusion. There is concern regarding patient being with evidence of cervical radiculopathy at this time The risks, benefits, and expectations of the procedure have been discussed and explained to the patient who was understanding and wished to proceed with interventional treatment as planned. Will proceed with interventional treatment as discussed and as explained to the patient.  DESCRIPTION OF PROCEDURE: Cervical epidural steroid injection with IV Versed, IV fentanyl conscious sedation, EKG, blood pressure, pulse, and pulse oximetry monitoring. The procedure was performed with the patient in the prone position under fluoroscopic guidance. With AP view of the cervical spine, the patient in the prone position, Betadine prep of proposed entry site was performed. Following local anesthetic skin wheal of 1.5% plain lidocaine of proposed needle entry site, 18-gauge Tuohy epidural needle inserted via hanging drop technique at the C 6 vertebral body level with needle placed at the right  of the midline under fluoroscopic guidance. Following needle placement under fluoroscopic guidance, a total of 4 mL of Preservative-Free normal saline with 40 mg of Kenalog injected incrementally via cervical epidural placed needle. Needle removed. The patient tolerated the injection well.     Myoneural block injections of cervical region Following Betadine prep of proposed  entry site a 22-gauge needle was inserted in the paraspinal musculature region of the cervical region and following negative aspiration 2 cc of 0.25% bupivacaine with Norflex was injected for myoneural block injection of cervical region 4  The patient tolerated procedure well  PLAN:  1. Medications: Will continue presently prescribed medication hydrocodone acetaminophen 2. Will consider modification of treatment regimen at time of return appointment pending response to treatment rendered on today's visit.  3. The patient is to follow-up with primary care physician  Dr.Tullo  for evaluation of blood pressure and general medical condition status post steroid injection performed on today's visit.  4. Surgical evaluation has been addressed  5. Neurological evaluation. May consider PNCV EMG studies  6. May consider radiofrequency procedures, implantation device, and other treatment pending response to treatment and follow-up evaluation.  7. The patient has been advised to adhere to proper body mechanics and avoid activities which appear to aggravate condition.  8. The patient has been advised to call the Pain Management Center prior to scheduled return appointment should there be significant change in condition or should patient have other concerns regarding condition prior to scheduled return appointment.  The patient is understanding and in agreement with suggested treatment plan.    Review of Systems     Objective:   Physical Exam        Assessment & Plan:

## 2015-03-28 NOTE — Patient Instructions (Addendum)
PLAN   Continue present medication hydrocodone acetaminophen. Please get Cipro antibiotic today and begin taking Cipro antibiotic as prescribed  F/U PCP  Dr Derrel Nip for evaliation of  BP and general medical  condition  F/U surgical evaluation. May consider pending follow-up evaluations  F/U neurological evaluation. May consider pending follow-up evaluations  May consider radiofrequency rhizolysis or intraspinal procedures pending response to present treatment and F/U evaluation   Patient to call Pain Management Center should patient have concerns prior to scheduled return appointment.Epidural Steroid Injection Patient Information  Description: The epidural space surrounds the nerves as they exit the spinal cord.  In some patients, the nerves can be compressed and inflamed by a bulging disc or a tight spinal canal (spinal stenosis).  By injecting steroids into the epidural space, we can bring irritated nerves into direct contact with a potentially helpful medication.  These steroids act directly on the irritated nerves and can reduce swelling and inflammation which often leads to decreased pain.  Epidural steroids may be injected anywhere along the spine and from the neck to the low back depending upon the location of your pain.   After numbing the skin with local anesthetic (like Novocaine), a small needle is passed into the epidural space slowly.  You may experience a sensation of pressure while this is being done.  The entire block usually last less than 10 minutes.  Conditions which may be treated by epidural steroids:   Low back and leg pain  Neck and arm pain  Spinal stenosis  Post-laminectomy syndrome  Herpes zoster (shingles) pain  Pain from compression fractures  Preparation for the injection:  1. Do not eat any solid food or dairy products within 6 hours of your appointment.  2. You may drink clear liquids up to 2 hours before appointment.  Clear liquids include water,  black coffee, juice or soda.  No milk or cream please. 3. You may take your regular medication, including pain medications, with a sip of water before your appointment  Diabetics should hold regular insulin (if taken separately) and take 1/2 normal NPH dos the morning of the procedure.  Carry some sugar containing items with you to your appointment. 4. A driver must accompany you and be prepared to drive you home after your procedure.  5. Bring all your current medications with your. 6. An IV may be inserted and sedation may be given at the discretion of the physician.   7. A blood pressure cuff, EKG and other monitors will often be applied during the procedure.  Some patients may need to have extra oxygen administered for a short period. 8. You will be asked to provide medical information, including your allergies, prior to the procedure.  We must know immediately if you are taking blood thinners (like Coumadin/Warfarin)  Or if you are allergic to IV iodine contrast (dye). We must know if you could possible be pregnant.  Possible side-effects:  Bleeding from needle site  Infection (rare, may require surgery)  Nerve injury (rare)  Numbness & tingling (temporary)  Difficulty urinating (rare, temporary)  Spinal headache ( a headache worse with upright posture)  Light -headedness (temporary)  Pain at injection site (several days)  Decreased blood pressure (temporary)  Weakness in arm/leg (temporary)  Pressure sensation in back/neck (temporary)  Call if you experience:  Fever/chills associated with headache or increased back/neck pain.  Headache worsened by an upright position.  New onset weakness or numbness of an extremity below the injection site  Hives  or difficulty breathing (go to the emergency room)  Inflammation or drainage at the infection site  Severe back/neck pain  Any new symptoms which are concerning to you  Please note:  Although the local anesthetic  injected can often make your back or neck feel good for several hours after the injection, the pain will likely return.  It takes 3-7 days for steroids to work in the epidural space.  You may not notice any pain relief for at least that one week.  If effective, we will often do a series of three injections spaced 3-6 weeks apart to maximally decrease your pain.  After the initial series, we generally will wait several months before considering a repeat injection of the same type.  If you have any questions, please call [phone] [facility] Dis[facility] Inst[facility]  General Discharge Instructions :  If you need to reach your doctor call: Monday-Friday 8:00 am - 4:00 pm at [phone] or toll free [phone].  After clinic hours [phone] to have operator reach doctor.  Bring all of your medication bottles to all your appointments in the pain clinic.  To cancel or reschedule your appointment with Pain Management please remember to call 24 hours in advance to avoid a fee.  Refer to the educational materials which you have been given on: General Risks, I had my Procedure. Discharge Instructions, Post Sedation.  Post Procedure Instructions:  The drugs you were given will stay in your system until tomorrow, so for the next 24 hours you should not drive, make any legal decisions or drink any alcoholic beverages.  You may eat anything you prefer, but it is better to start with liquids then soups and crackers, and gradually work up to solid foods.  Please notify your doctor immediately if you have any unusual bleeding, trouble breathing or pain that is not related to your normal pain.  Depending on the type of procedure that was done, some parts of your body may feel week and/or numb.  This usually clears up by tonight or the next day.  Walk with the use of an assistive device or accompanied by an adult for the 24 hours.  You may use ice  on the affected area for the first 24 hours.  Put ice in a Ziploc bag and cover with a towel and place against area 15 minutes on 15 minutes off.  You may switch to heat after 24 hours.

## 2015-03-28 NOTE — Progress Notes (Signed)
Safety precautions to be maintained throughout the outpatient stay will include: orient to surroundings, keep bed in low position, maintain call bell within reach at all times, provide assistance with transfer out of bed and ambulation.  

## 2015-03-29 ENCOUNTER — Telehealth: Payer: Self-pay | Admitting: *Deleted

## 2015-03-29 NOTE — Telephone Encounter (Signed)
No problems post procedure. 

## 2015-04-09 ENCOUNTER — Telehealth: Payer: Self-pay | Admitting: *Deleted

## 2015-04-09 ENCOUNTER — Other Ambulatory Visit (INDEPENDENT_AMBULATORY_CARE_PROVIDER_SITE_OTHER): Payer: BLUE CROSS/BLUE SHIELD

## 2015-04-09 DIAGNOSIS — E119 Type 2 diabetes mellitus without complications: Secondary | ICD-10-CM

## 2015-04-09 LAB — CUP PACEART INCLINIC DEVICE CHECK
Battery Remaining Longevity: 60 mo
Brady Statistic AP VP Percent: 0.14 %
Brady Statistic AP VS Percent: 0.03 %
Brady Statistic RV Percent Paced: 99.85 %
Date Time Interrogation Session: 20161202014108
Implantable Lead Implant Date: 20150212
Implantable Lead Location: 753858
Implantable Lead Location: 753860
Implantable Lead Model: 5076
Lead Channel Impedance Value: 399 Ohm
Lead Channel Impedance Value: 418 Ohm
Lead Channel Impedance Value: 456 Ohm
Lead Channel Impedance Value: 513 Ohm
Lead Channel Impedance Value: 589 Ohm
Lead Channel Pacing Threshold Amplitude: 0.5 V
Lead Channel Pacing Threshold Amplitude: 0.75 V
Lead Channel Sensing Intrinsic Amplitude: 2.75 mV
Lead Channel Setting Sensing Sensitivity: 0.9 mV
MDC IDC LEAD IMPLANT DT: 20150212
MDC IDC LEAD IMPLANT DT: 20150212
MDC IDC LEAD LOCATION: 753859
MDC IDC MSMT BATTERY VOLTAGE: 3.01 V
MDC IDC MSMT LEADCHNL LV IMPEDANCE VALUE: 608 Ohm
MDC IDC MSMT LEADCHNL LV PACING THRESHOLD AMPLITUDE: 1.25 V
MDC IDC MSMT LEADCHNL LV PACING THRESHOLD PULSEWIDTH: 0.4 ms
MDC IDC MSMT LEADCHNL RA IMPEDANCE VALUE: 399 Ohm
MDC IDC MSMT LEADCHNL RA PACING THRESHOLD PULSEWIDTH: 0.4 ms
MDC IDC MSMT LEADCHNL RV IMPEDANCE VALUE: 437 Ohm
MDC IDC MSMT LEADCHNL RV IMPEDANCE VALUE: 513 Ohm
MDC IDC MSMT LEADCHNL RV PACING THRESHOLD PULSEWIDTH: 0.4 ms
MDC IDC MSMT LEADCHNL RV SENSING INTR AMPL: 19.25 mV
MDC IDC SET LEADCHNL LV PACING AMPLITUDE: 2.5 V
MDC IDC SET LEADCHNL LV PACING PULSEWIDTH: 0.8 ms
MDC IDC SET LEADCHNL RA PACING AMPLITUDE: 1.5 V
MDC IDC SET LEADCHNL RV PACING AMPLITUDE: 2 V
MDC IDC SET LEADCHNL RV PACING PULSEWIDTH: 0.4 ms
MDC IDC STAT BRADY AS VP PERCENT: 99.71 %
MDC IDC STAT BRADY AS VS PERCENT: 0.12 %
MDC IDC STAT BRADY RA PERCENT PACED: 0.17 %

## 2015-04-09 LAB — HEMOGLOBIN A1C: Hgb A1c MFr Bld: 7.1 % — ABNORMAL HIGH (ref 4.6–6.5)

## 2015-04-09 LAB — LDL CHOLESTEROL, DIRECT: Direct LDL: 146 mg/dL

## 2015-04-09 LAB — LIPID PANEL
Cholesterol: 238 mg/dL — ABNORMAL HIGH (ref 0–200)
HDL: 42.5 mg/dL (ref >39.00)
NONHDL: 195.61
Total CHOL/HDL Ratio: 6
Triglycerides: 257 mg/dL — ABNORMAL HIGH (ref 0.0–149.0)
VLDL: 51.4 mg/dL — AB (ref 0.0–40.0)

## 2015-04-09 NOTE — Telephone Encounter (Signed)
Labs and dx?  

## 2015-04-11 ENCOUNTER — Encounter: Payer: Self-pay | Admitting: Internal Medicine

## 2015-04-11 ENCOUNTER — Ambulatory Visit (INDEPENDENT_AMBULATORY_CARE_PROVIDER_SITE_OTHER): Payer: Medicare Other | Admitting: Internal Medicine

## 2015-04-11 VITALS — BP 126/78 | HR 74 | Temp 98.1°F | Resp 12 | Ht 65.0 in | Wt 176.0 lb

## 2015-04-11 DIAGNOSIS — E785 Hyperlipidemia, unspecified: Secondary | ICD-10-CM | POA: Diagnosis not present

## 2015-04-11 DIAGNOSIS — E119 Type 2 diabetes mellitus without complications: Secondary | ICD-10-CM

## 2015-04-11 DIAGNOSIS — I428 Other cardiomyopathies: Secondary | ICD-10-CM

## 2015-04-11 DIAGNOSIS — Z23 Encounter for immunization: Secondary | ICD-10-CM

## 2015-04-11 DIAGNOSIS — E663 Overweight: Secondary | ICD-10-CM

## 2015-04-11 MED ORDER — FUROSEMIDE 20 MG PO TABS: 20.0000 mg | ORAL_TABLET | Freq: Every day | ORAL | Status: DC

## 2015-04-11 NOTE — Patient Instructions (Addendum)
Your diabetes is under fairly good control on current regimen, but your a1c has increased slightly to 7.1 (goal is <, 7.00).  your cholesterol is actually improving in some ways .Marland Kitchen  There are no medication changes are needed at this time, but please make sure you are following a low glycemic index diet and exercising daily (at least going for a walk) .   Dr Lovena Le wants you to be careful with water intake because you have a mildly reduced ejection fraction by your last ECHO.  This causes your body to hold on to water .  Limit your salt intake and drink water as needed. Weigh yourself daily over the  holidays.  If you gain 2 lbs overnight,,  You have retained fluid, Take a 20 mg dsoe of furosemide that day to lose the excess fluid

## 2015-04-11 NOTE — Progress Notes (Signed)
Subjective:  Patient ID: Julia Pierce, female    DOB: June 17, 1955  Age: 59 y.o. MRN: [id number]  CC: The primary encounter diagnosis was Encounter for immunization. Diagnoses of Diabetes mellitus type 2, diet-controlled ([place]), Hyperlipidemia with target LDL less than 100, Valvular cardiomyopathy ([place]), and Overweight (BMI 25.0-29.9) were also pertinent to this visit.  HPI.  Julia Pierce presents for follow up on chronic conditions including Type 2 DM,  Hypertension hyperlipidemia  And obesity   Slight loss of control noted with repeat testing.  a1c 7.1 due to overeating  Swimming 3 times weekly unless the left leg starts to spasm and have numbness    Feeling better emotionally,  Less family dysfunction .  Watching less TV,  Not sleeping well due to cervical spine pain and back pain . Had an epidural  injection 2 weeks ago which helped  Diet reviewed:   Has been eating a lot of junk food.   Was told by cardiology during pacer interrogation to be careful to limit her water intake .  2015 ECHO reviewed.  EF 45% with hypokinesis .   Outpatient Prescriptions Prior to Visit  Medication Sig Dispense Refill  . aspirin EC 81 MG tablet Take 81 mg by mouth daily.    . Azelaic Acid (FINACEA EX) Apply 1 application topically 2 (two) times daily.    . B Complex Vitamins (B COMPLEX PO) Take 1 tablet by mouth daily.     . calcium carbonate (OS-CAL) 600 MG TABS Take 600 mg by mouth daily.      . Cholecalciferol (VITAMIN D3) 2000 UNITS TABS Take 1 tablet by mouth daily.    . colchicine 0.6 MG tablet Take 0.6 mg by mouth 2 (two) times daily as needed (Gout).    Marland Kitchen esomeprazole (NEXIUM) 20 MG capsule Take 40 mg by mouth daily at 12 noon.    . fluticasone (FLONASE) 50 MCG/ACT nasal spray Place 2 sprays into both nostrils daily.    Marland Kitchen HYDROcodone-acetaminophen (NORCO/VICODIN) 5-325 MG tablet Limit 1-3 tabs by mouth daily if tolerated 80 tablet 0  . losartan-hydrochlorothiazide (HYZAAR) 50-12.5 MG per tablet  TAKE 1 TABLET BY MOUTH DAILY. 90 tablet 1  . methocarbamol (ROBAXIN) 500 MG tablet TAKE 1 TABLET BY MOUTH EVERY 8 HOURS AS NEEDED FOR MUSCLE SPASMS.Marland KitchenFILL ON 07-06-13 270 tablet 2  . metoprolol tartrate (LOPRESSOR) 25 MG tablet TAKE 1/2 TABLET BY MOUTH TWICE A DAY    . Multiple Vitamin (MULTIVITAMIN) tablet Take 1 tablet by mouth daily.      . Omega-3 Fatty Acids (FISH OIL) 1000 MG CAPS Take 1 capsule by mouth 2 (two) times daily.    . sertraline (ZOLOFT) 100 MG tablet TAKE 1 TABLET BY MOUTH EVERY DAY 90 tablet 1  . sertraline (ZOLOFT) 100 MG tablet TAKE 1 TABLET BY MOUTH EVERY DAY 90 tablet 0  . spironolactone (ALDACTONE) 25 MG tablet Take 25 mg by mouth daily.    Marland Kitchen tolterodine (DETROL) 2 MG tablet Take 2 mg by mouth 2 (two) times daily.    . ciprofloxacin (CIPRO) 250 MG tablet Take 1 tablet (250 mg total) by mouth 2 (two) times daily. (Patient not taking: Reported on 04/11/2015) 14 tablet 0   Facility-Administered Medications Prior to Visit  Medication Dose Route Frequency Provider Last Rate Last Dose  . bupivacaine (PF) (MARCAINE) 0.25 % injection 30 mL  30 mL Other Once Mohammed Kindle, MD      . ciprofloxacin (CIPRO) IVPB 400 mg  400 mg Intravenous Once  Mohammed Kindle, MD      . fentaNYL (SUBLIMAZE) injection 100 mcg  100 mcg Intravenous Once Mohammed Kindle, MD      . lactated ringers infusion 1,000 mL  1,000 mL Intravenous Continuous Mohammed Kindle, MD      . lidocaine (PF) (XYLOCAINE) 1 % injection 10 mL  10 mL Subcutaneous Once Mohammed Kindle, MD      . midazolam (VERSED) 5 MG/5ML injection 5 mg  5 mg Intravenous Once Mohammed Kindle, MD      . orphenadrine (NORFLEX) injection 60 mg  60 mg Intramuscular Once Mohammed Kindle, MD      . sodium chloride 0.9 % injection 20 mL  20 mL Other Once Mohammed Kindle, MD      . triamcinolone acetonide (KENALOG-40) injection 40 mg  40 mg Other Once Mohammed Kindle, MD        Review of Systems;  Patient denies headache, fevers, malaise, unintentional weight  loss, skin rash, eye pain, sinus congestion and sinus pain, sore throat, dysphagia,  hemoptysis , cough, dyspnea, wheezing, chest pain, palpitations, orthopnea, edema, abdominal pain, nausea, melena, diarrhea, constipation, flank pain, dysuria, hematuria, urinary  Frequency, nocturia, numbness, tingling, seizures,  Focal weakness, Loss of consciousness,  Tremor, insomnia, depression, anxiety, and suicidal ideation.      Objective:  BP 126/78 mmHg  Pulse 74  Temp(Src) 98.1 F (36.7 C) (Oral)  Resp 12  Ht 5\' 5"  (1.651 m)  Wt 176 lb (79.833 kg)  BMI 29.29 kg/m2  SpO2 98%  BP Readings from Last 3 Encounters:  04/11/15 126/78  03/28/15 150/70  03/22/15 140/82    Wt Readings from Last 3 Encounters:  04/11/15 176 lb (79.833 kg)  03/28/15 176 lb (79.833 kg)  03/22/15 178 lb 9.6 oz (81.012 kg)    General appearance: alert, cooperative and appears stated age Ears: normal TM's and external ear canals both ears Throat: lips, mucosa, and tongue normal; teeth and gums normal Neck: no adenopathy, no carotid bruit, supple, symmetrical, trachea midline and thyroid not enlarged, symmetric, no tenderness/mass/nodules Back: symmetric, no curvature. ROM normal. No CVA tenderness. Lungs: clear to auscultation bilaterally Heart: regular rate and rhythm, S1, S2 normal, no murmur, click, rub or gallop Abdomen: soft, non-tender; bowel sounds normal; no masses,  no organomegaly Pulses: 2+ and symmetric Skin: Skin color, texture, turgor normal. No rashes or lesions Lymph nodes: Cervical, supraclavicular, and axillary nodes normal.  Lab Results  Component Value Date   HGBA1C 7.1* 04/09/2015   HGBA1C 6.5 09/25/2014   HGBA1C 7.0* 06/26/2014    Lab Results  Component Value Date   CREATININE 0.74 04/09/2015   CREATININE 0.61 09/25/2014   CREATININE 0.78 06/26/2014    Lab Results  Component Value Date   WBC 9.6 06/01/2013   HGB 12.9 06/01/2013   HCT 37.4 06/01/2013   PLT 204 06/01/2013    GLUCOSE 124* 04/09/2015   CHOL 238* 04/09/2015   TRIG 257.0* 04/09/2015   HDL 42.50 04/09/2015   LDLDIRECT 146.0 04/09/2015   LDLCALC 88 08/17/2013   ALT 16 04/09/2015   AST 14 04/09/2015   NA 142 04/09/2015   K 4.4 04/09/2015   CL 102 04/09/2015   CREATININE 0.74 04/09/2015   BUN 19 04/09/2015   CO2 30 04/09/2015   TSH 3.11 05/09/2013   HGBA1C 7.1* 04/09/2015   MICROALBUR 0.9 09/25/2014    No results found.  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 No edema on exam  weight is stable. Currently asymptomatic   Continue current medications.  avoid salt.  Continue regular exercise         Relevant Medications   furosemide (LASIX) 20 MG tablet   Hyperlipidemia with target LDL less than 100    LDL is at goal, Triglycerides are elevated but < 300.  Low GI diet recommended.  Lab Results  Component Value Date   CHOL 238* 04/09/2015   HDL 42.50 04/09/2015   LDLCALC 88 08/17/2013   LDLDIRECT 146.0 04/09/2015   TRIG 257.0* 04/09/2015   CHOLHDL 6 04/09/2015   Lab Results  Component Value Date   ALT 16 04/09/2015   AST 14 04/09/2015   ALKPHOS 61 04/09/2015   BILITOT 0.4 04/09/2015         Relevant Medications   furosemide (LASIX) 20 MG tablet   Overweight (BMI 25.0-29.9)    I have addressed  BMI and recommended wt loss of 10% of body weigh over the next 6 months using a low glycemic index diet and regular exercise a minimum of 5 days per week.        Diabetes mellitus type 2, diet-controlled ([place])    Well-controlled on diet historically, but hemoglobin A1c has risen slightly due to dietary noncompliance. .I have addressed  BMI and recommended wt loss of 10% of body weigh over the next 6 months using a low glycemic index diet and regular exercise a minimum of 5 days per week.   her foot exam is normal today.   Lab Results  Component Value Date   HGBA1C 7.1* 04/09/2015   Lab Results    Component Value Date   MICROALBUR 0.9 09/25/2014          Other Visit Diagnoses    Encounter for immunization    -  Primary      A total of 25 minutes of face to face time was spent with patient more than half of which was spent in counselling about the above mentioned conditions  and coordination of care  I have discontinued Ms. Schatz's ciprofloxacin. I am also having her start on furosemide. Additionally, I am having her maintain her calcium carbonate, multivitamin, spironolactone, B Complex Vitamins (B COMPLEX PO), tolterodine, Azelaic Acid (FINACEA EX), Vitamin D3, aspirin EC, esomeprazole, fluticasone, metoprolol tartrate, Fish Oil, methocarbamol, losartan-hydrochlorothiazide, sertraline, sertraline, HYDROcodone-acetaminophen, and colchicine. We will continue to administer bupivacaine (PF), ciprofloxacin, fentaNYL, lactated ringers, lidocaine (PF), midazolam, orphenadrine, sodium chloride, and triamcinolone acetonide.  Meds ordered this encounter  Medications  . furosemide (LASIX) 20 MG tablet    Sig: Take 1 tablet (20 mg total) by mouth daily. As needed for fluid retention    Dispense:  30 tablet    Refill:  0    Medications Discontinued During This Encounter  Medication Reason  . ciprofloxacin (CIPRO) 250 MG tablet Completed Course    Follow-up: Return in about 3 months (around 07/10/2015) for follow up diabetes.   Crecencio Mc, MD

## 2015-04-11 NOTE — Progress Notes (Signed)
Pre-visit discussion using our clinic review tool. No additional management support is needed unless otherwise documented below in the visit note.  

## 2015-04-14 NOTE — Assessment & Plan Note (Addendum)
LDL is at goal, Triglycerides are elevated but < 300.  Low GI diet recommended.  Lab Results  Component Value Date   CHOL 238* 04/09/2015   HDL 42.50 04/09/2015   LDLCALC 88 08/17/2013   LDLDIRECT 146.0 04/09/2015   TRIG 257.0* 04/09/2015   CHOLHDL 6 04/09/2015   Lab Results  Component Value Date   ALT 16 04/09/2015   AST 14 04/09/2015   ALKPHOS 61 04/09/2015   BILITOT 0.4 04/09/2015

## 2015-04-14 NOTE — Assessment & Plan Note (Signed)
I have addressed  BMI and recommended wt loss of 10% of body weigh over the next 6 months using a low glycemic index diet and regular exercise a minimum of 5 days per week.   

## 2015-04-14 NOTE — Assessment & Plan Note (Signed)
Managed medically. No edema on exam  weight is stable. Currently asymptomatic   Continue current medications.  avoid salt.  Continue regular exercise

## 2015-04-14 NOTE — Assessment & Plan Note (Signed)
Well-controlled on diet historically, but hemoglobin A1c has risen slightly due to dietary noncompliance. .I have addressed  BMI and recommended wt loss of 10% of body weigh over the next 6 months using a low glycemic index diet and regular exercise a minimum of 5 days per week.   her foot exam is normal today.   Lab Results  Component Value Date   HGBA1C 7.1* 04/09/2015   Lab Results  Component Value Date   MICROALBUR 0.9 09/25/2014

## 2015-04-17 LAB — COMPREHENSIVE METABOLIC PANEL
ALBUMIN: 4.1 g/dL (ref 3.5–5.2)
ALT: 16 U/L (ref 0–35)
AST: 14 U/L (ref 0–37)
Alkaline Phosphatase: 61 U/L (ref 39–117)
BUN: 19 mg/dL (ref 6–23)
CHLORIDE: 102 meq/L (ref 96–112)
CO2: 30 meq/L (ref 19–32)
CREATININE: 0.74 mg/dL (ref 0.40–1.20)
Calcium: 9.5 mg/dL (ref 8.4–10.5)
GFR: 84.88 mL/min (ref >60.00)
GLUCOSE: 124 mg/dL — AB (ref 70–99)
POTASSIUM: 4.4 meq/L (ref 3.5–5.1)
SODIUM: 142 meq/L (ref 135–145)
Total Bilirubin: 0.4 mg/dL (ref 0.2–1.2)
Total Protein: 6.4 g/dL (ref 6.0–8.3)

## 2015-04-18 ENCOUNTER — Encounter: Payer: Self-pay | Admitting: Internal Medicine

## 2015-04-19 ENCOUNTER — Ambulatory Visit: Payer: BLUE CROSS/BLUE SHIELD | Attending: Pain Medicine | Admitting: Pain Medicine

## 2015-04-19 ENCOUNTER — Encounter: Payer: Self-pay | Admitting: Pain Medicine

## 2015-04-19 VITALS — BP 112/84 | HR 71 | Temp 97.7°F | Resp 16 | Ht 62.0 in | Wt 174.0 lb

## 2015-04-19 DIAGNOSIS — M5010 Cervical disc disorder with radiculopathy, unspecified cervical region: Secondary | ICD-10-CM

## 2015-04-19 DIAGNOSIS — M47816 Spondylosis without myelopathy or radiculopathy, lumbar region: Secondary | ICD-10-CM

## 2015-04-19 DIAGNOSIS — M5481 Occipital neuralgia: Secondary | ICD-10-CM

## 2015-04-19 DIAGNOSIS — Z981 Arthrodesis status: Secondary | ICD-10-CM | POA: Insufficient documentation

## 2015-04-19 DIAGNOSIS — M542 Cervicalgia: Secondary | ICD-10-CM | POA: Diagnosis present

## 2015-04-19 DIAGNOSIS — M7062 Trochanteric bursitis, left hip: Secondary | ICD-10-CM

## 2015-04-19 DIAGNOSIS — M5136 Other intervertebral disc degeneration, lumbar region: Secondary | ICD-10-CM | POA: Insufficient documentation

## 2015-04-19 DIAGNOSIS — M5030 Other cervical disc degeneration, unspecified cervical region: Secondary | ICD-10-CM

## 2015-04-19 DIAGNOSIS — M533 Sacrococcygeal disorders, not elsewhere classified: Secondary | ICD-10-CM | POA: Diagnosis not present

## 2015-04-19 DIAGNOSIS — M47812 Spondylosis without myelopathy or radiculopathy, cervical region: Secondary | ICD-10-CM

## 2015-04-19 DIAGNOSIS — R51 Headache: Secondary | ICD-10-CM | POA: Insufficient documentation

## 2015-04-19 DIAGNOSIS — M7061 Trochanteric bursitis, right hip: Secondary | ICD-10-CM

## 2015-04-19 DIAGNOSIS — M51369 Other intervertebral disc degeneration, lumbar region without mention of lumbar back pain or lower extremity pain: Secondary | ICD-10-CM

## 2015-04-19 MED ORDER — HYDROCODONE-ACETAMINOPHEN 5-325 MG PO TABS: ORAL_TABLET | ORAL | Status: DC

## 2015-04-19 NOTE — Patient Instructions (Addendum)
PLAN   Continue present medication hydrocodone acetaminophen and consider replacing Robaxin with Zanaflex  F/U PCP  Dr Derrel Nip for evaliation of  BP and general medical  condition  F/U surgical evaluation. May consider pending follow-up evaluations  F/U neurological evaluation. May consider pending follow-up evaluations  May consider radiofrequency rhizolysis or intraspinal procedures pending response to present treatment and F/U evaluation   Patient to call Pain Management Center should patient have concerns prior to scheduled return appointment.

## 2015-04-19 NOTE — Progress Notes (Signed)
Safety precautions to be maintained throughout the outpatient stay will include: orient to surroundings, keep bed in low position, maintain call bell within reach at all times, provide assistance with transfer out of bed and ambulation.  

## 2015-04-19 NOTE — Progress Notes (Signed)
   Subjective:    Patient ID: Julia Pierce, female    DOB: Jun 07, 1955, 59 y.o.   MRN: [id number]  HPI  The patient is a 59 year old female who returns to pain management for further evaluation and treatment of pain involving the region of the neck upper extremity regions with headache as well with pain radiating from the back of the neck to the back of the head and continuing forward to the retro-orbital region patient also has pain of the mid back region and needs the shoulder blades patient has been swimming and this has provided her with significant relief of pain as well as significant range of motion the patient is status post cervical epidural steroid injection performed at time of previous visits to pain management Center and continues to benefit from the procedure we discussed patient's condition on today's visit and patient appears to be doing quite well we will continue hydrocodone acetaminophen and we will consider patient for additional treatment pending follow-up evaluation the patient has pain of the lumbar and lower extremity region of lesser degree the patient is with prior surgery of the cervical region and is without desire to consider additional surgery at this time we will remain available to consider patient for interventional treatment should they be return of significant pain and consider modification of other aspects of patient's treatment regimen the patient was understanding and in agreement with suggested treatment plan       Review of Systems     Objective:   Physical Exam  There was tends to palpation of the splenius capitis and occipitalis musculature regions of mild degree tThere was unremarkable Spurling's maneuver. The patient appeared to be with slightly decreased grip strength. Tinel and Phalen's maneuver were without increase of pain of significant degree there was tenderness to palpation over the cervical facet and thoracic facet regions with no crepitus of the  thoracic region noted. There was tenderness over the lumbar paraspinal musculature region lumbar facet region of mild degree to moderate degree with lateral bending and rotation extension and palpation of the lumbar facets reproducing mild discomfort. Palpation over the PSIS and PII S region reproduced pain of mild degree there was mild tenderness of the greater trochanteric region and iliotibial band. Straight leg raising was tolerates approximately 30 without increase of pain with dorsiflexion noted. Palpation of the greater trochanteric region and iliotibial band region was with tenderness to palpation on the left greater than the right There was negative clonus negative Homans. Abdomen was nontender with no costovertebral angle tenderness noted      Assessment & Plan:     Degenerative disc disease cervical spine  Degenerative changes C2-3, C3-4, C4-5, and C5-6, with multilevel degenerative changes of the cervical spine  Status post cervical fusion   Bilateral occipital neuralgia   Cervicogenic headache   Sacroiliac joint dysfunction   Degenerative disc disease lumbar spine  L3-4 L4-5 and L5-S1 degenerative changes  Lumbar facet syndrome      PLAN   Continue present medication hydrocodone acetaminophen and consider replacing Robaxin with Zanaflex  F/U PCP  Dr Derrel Nip for evaliation of  BP and general medical  condition  F/U surgical evaluation. May consider pending follow-up evaluations  F/U neurological evaluation. May consider pending follow-up evaluations  May consider radiofrequency rhizolysis or intraspinal procedures pending response to present treatment and F/U evaluation   Patient to call Pain Management Center should patient have concerns prior to scheduled return appointment.

## 2015-05-09 ENCOUNTER — Other Ambulatory Visit: Payer: Self-pay | Admitting: Internal Medicine

## 2015-05-10 ENCOUNTER — Other Ambulatory Visit: Payer: Self-pay | Admitting: Internal Medicine

## 2015-05-14 DIAGNOSIS — I952 Hypotension due to drugs: Secondary | ICD-10-CM | POA: Diagnosis not present

## 2015-05-14 DIAGNOSIS — E782 Mixed hyperlipidemia: Secondary | ICD-10-CM | POA: Diagnosis not present

## 2015-05-14 DIAGNOSIS — Z950 Presence of cardiac pacemaker: Secondary | ICD-10-CM | POA: Diagnosis not present

## 2015-05-14 DIAGNOSIS — I420 Dilated cardiomyopathy: Secondary | ICD-10-CM | POA: Diagnosis not present

## 2015-05-17 ENCOUNTER — Encounter: Payer: Self-pay | Admitting: Pain Medicine

## 2015-05-17 ENCOUNTER — Ambulatory Visit: Payer: BLUE CROSS/BLUE SHIELD | Attending: Pain Medicine | Admitting: Pain Medicine

## 2015-05-17 VITALS — BP 121/55 | HR 92 | Temp 96.6°F | Resp 18 | Ht 62.5 in | Wt 174.0 lb

## 2015-05-17 DIAGNOSIS — M7062 Trochanteric bursitis, left hip: Secondary | ICD-10-CM

## 2015-05-17 DIAGNOSIS — R51 Headache: Secondary | ICD-10-CM | POA: Diagnosis not present

## 2015-05-17 DIAGNOSIS — Z981 Arthrodesis status: Secondary | ICD-10-CM | POA: Diagnosis not present

## 2015-05-17 DIAGNOSIS — M7061 Trochanteric bursitis, right hip: Secondary | ICD-10-CM

## 2015-05-17 DIAGNOSIS — M542 Cervicalgia: Secondary | ICD-10-CM | POA: Diagnosis present

## 2015-05-17 DIAGNOSIS — M47816 Spondylosis without myelopathy or radiculopathy, lumbar region: Secondary | ICD-10-CM

## 2015-05-17 DIAGNOSIS — M5010 Cervical disc disorder with radiculopathy, unspecified cervical region: Secondary | ICD-10-CM

## 2015-05-17 DIAGNOSIS — Z950 Presence of cardiac pacemaker: Secondary | ICD-10-CM | POA: Insufficient documentation

## 2015-05-17 DIAGNOSIS — M5481 Occipital neuralgia: Secondary | ICD-10-CM | POA: Insufficient documentation

## 2015-05-17 DIAGNOSIS — M533 Sacrococcygeal disorders, not elsewhere classified: Secondary | ICD-10-CM | POA: Diagnosis not present

## 2015-05-17 DIAGNOSIS — M546 Pain in thoracic spine: Secondary | ICD-10-CM | POA: Diagnosis present

## 2015-05-17 DIAGNOSIS — M47812 Spondylosis without myelopathy or radiculopathy, cervical region: Secondary | ICD-10-CM | POA: Diagnosis not present

## 2015-05-17 DIAGNOSIS — M5136 Other intervertebral disc degeneration, lumbar region: Secondary | ICD-10-CM | POA: Diagnosis not present

## 2015-05-17 DIAGNOSIS — M5030 Other cervical disc degeneration, unspecified cervical region: Secondary | ICD-10-CM

## 2015-05-17 MED ORDER — HYDROCODONE-ACETAMINOPHEN 5-325 MG PO TABS: ORAL_TABLET | ORAL | Status: DC

## 2015-05-17 NOTE — Patient Instructions (Addendum)
PLAN   Continue present medication hydrocodone acetaminophen and consider replacing Robaxin with Zanaflex or other muscle relaxant as previously discussed we will avoid considering a change of medication at this time due to general medical condition as discussed  F/U PCP  Dr Derrel Nip for evaliation of  BP and general medical  condition  F/U surgical evaluation. May consider pending follow-up evaluations  F/U neurological evaluation. May consider pending follow-up evaluations  May consider radiofrequency rhizolysis or intraspinal procedures pending response to present treatment and F/U evaluation   Patient to call Pain Management Center should patient have concerns prior to scheduled return appointment.

## 2015-05-17 NOTE — Progress Notes (Signed)
   Subjective:    Patient ID: Shailey Uhl, female    DOB: [birth year], 60 y.o.   MRN: [id number]  HPI    Review of Systems     Objective:   Physical Exam        Assessment & Plan:  PLAN   Continue present medication hydrocodone acetaminophen and consider replacing Robaxin with Zanaflex or other muscle relaxant as previously discussed we will avoid considering a change of medication at this time due to general medical condition as discussed  F/U PCP  Dr Derrel Nip for evaliation of  BP and general medical  condition  F/U surgical evaluation. May consider pending follow-up evaluations  F/U neurological evaluation. May consider pending follow-up evaluations  May consider radiofrequency rhizolysis or intraspinal procedures pending response to present treatment and F/U evaluation   Patient to call Pain Management Center should patient have concerns prior to scheduled return appointment.

## 2015-05-17 NOTE — Progress Notes (Signed)
Safety precautions to be maintained throughout the outpatient stay will include: orient to surroundings, keep bed in low position, maintain call bell within reach at all times, provide assistance with transfer out of bed and ambulation.  

## 2015-05-17 NOTE — Progress Notes (Signed)
   Subjective:    Patient ID: Julia Pierce, female    DOB: 10-16-1955, 60 y.o.   MRN: [id number]  HPI  The patient is a 60 year old female who returns to pain management for further evaluation and treatment of pain involving the neck entire back upper and lower extremity region and headaches. The patient is with pain fairly well-controlled at this time. On today's visit the patient did complain of some lower back pain with lower extremity weakness. The patient has had improvement of pain involving the cervical region and upper extremity region status post cervical epidural steroid injection. Patient states that she recently underwent evaluation by Dr. Serafina Royals and that her metoprolol was discontinued. Patient has had complaint of fatigue and weakness. At the present time we will continue observe patient's response to modification of her treatment regimen and we'll remain available to consider modifications of treatment pending follow-up evaluation. The patient denied any trauma change in events of daily living the call significant change in symptomatology. The patient does have cardiac pacemaker and will follow-up with Dr. Serafina Royals as well as with her other physician regarding her pacemaker which is functioning fine at this time we discussed interventional treatment of pain of the cervical nerve extremity region and informed patient that we wish to avoid interventional treatment until patient undergoes further evaluation of her cardiac status and general medical condition. We remain available to consider patient for modifications of treatment as discussed pending follow-up evaluation. Patient was with understanding and agreement suggested treatment plan  Review of Systems     Objective:   Physical Exam  There was tenderness of the splenius capitis and occipitalis musculature regions of mild degree there appeared to be limited range of motion of the cervical spine with unremarkable Spurling's  maneuver. Palpation of the acromioclavicular and glenohumeral joint region was associated with mild discomfort. Patient was a slightly decreased grip strength and Tinel and Phalen's maneuver were without increase of pain of significant degree. Was tenderness over the cervical facet cervical paraspinal musculature region and thoracic facet thoracic paraspinal must reason of mild degree. Palpation over the lumbar paraspinal musculatures and lumbar facet region associated with mild to moderate discomfort. There was evidence of muscle spasms involving the thoracic oh lumbar region on the left as well as on the right. Straight leg raise was tolerates approximately 20 without increased pain with dorsiflexion noted. No definite sensory deficit or dermatomal distribution was detected. EHL strength appeared to be decreased. DTRs were difficult to elicit patient had difficulty relaxing. The PSIS and PII S region a moderate degree with mild tenderness of the greater trochanteric region and iliotibial band region. No definite sensory deficit or dermatomal distribution detected. There was negative clonus negative Homans. Abdomen was nontender with no costovertebral tenderness noted      Assessment & Plan:       Degenerative changes C2-3, C3-4, C4-5, and C5-6, with multilevel degenerative changes of the cervical spine  Status post cervical fusion   Bilateral occipital neuralgia   Cervicogenic headache   Sacroiliac joint dysfunction   Degenerative disc disease lumbar spine  L3-4 L4-5 and L5-S1 degenerative changes  Lumbar facet syndrome

## 2015-06-14 ENCOUNTER — Ambulatory Visit: Payer: BLUE CROSS/BLUE SHIELD | Attending: Pain Medicine | Admitting: Pain Medicine

## 2015-06-14 ENCOUNTER — Encounter: Payer: Self-pay | Admitting: Pain Medicine

## 2015-06-14 VITALS — BP 145/82 | HR 95 | Temp 98.4°F | Resp 18 | Ht 62.0 in | Wt 174.0 lb

## 2015-06-14 DIAGNOSIS — M5136 Other intervertebral disc degeneration, lumbar region: Secondary | ICD-10-CM | POA: Diagnosis not present

## 2015-06-14 DIAGNOSIS — Z981 Arthrodesis status: Secondary | ICD-10-CM | POA: Diagnosis not present

## 2015-06-14 DIAGNOSIS — R51 Headache: Secondary | ICD-10-CM | POA: Insufficient documentation

## 2015-06-14 DIAGNOSIS — M5030 Other cervical disc degeneration, unspecified cervical region: Secondary | ICD-10-CM

## 2015-06-14 DIAGNOSIS — M47816 Spondylosis without myelopathy or radiculopathy, lumbar region: Secondary | ICD-10-CM | POA: Diagnosis not present

## 2015-06-14 DIAGNOSIS — M7062 Trochanteric bursitis, left hip: Secondary | ICD-10-CM

## 2015-06-14 DIAGNOSIS — M5481 Occipital neuralgia: Secondary | ICD-10-CM | POA: Diagnosis not present

## 2015-06-14 DIAGNOSIS — M47812 Spondylosis without myelopathy or radiculopathy, cervical region: Secondary | ICD-10-CM | POA: Diagnosis not present

## 2015-06-14 DIAGNOSIS — M533 Sacrococcygeal disorders, not elsewhere classified: Secondary | ICD-10-CM | POA: Diagnosis not present

## 2015-06-14 DIAGNOSIS — M542 Cervicalgia: Secondary | ICD-10-CM | POA: Diagnosis present

## 2015-06-14 DIAGNOSIS — M546 Pain in thoracic spine: Secondary | ICD-10-CM | POA: Diagnosis present

## 2015-06-14 DIAGNOSIS — M5010 Cervical disc disorder with radiculopathy, unspecified cervical region: Secondary | ICD-10-CM

## 2015-06-14 DIAGNOSIS — M7061 Trochanteric bursitis, right hip: Secondary | ICD-10-CM

## 2015-06-14 MED ORDER — HYDROCODONE-ACETAMINOPHEN 5-325 MG PO TABS: ORAL_TABLET | ORAL | Status: DC

## 2015-06-14 NOTE — Progress Notes (Signed)
   Subjective:    Patient ID: Julia Pierce, female    DOB: May 18, 1955, 60 y.o.   MRN: [id number]  HPI  The patient is a 60 year old female who returns to pain management for further evaluation and treatment of pain involving the neck headaches as well as in the upper mid lower back and lower extremity region. The patient has pain which she states occurred in the region of the neck radiating to the hip. The patient also states that she had severe increased pain there is aggravated by known no known condition. At the present time patient has a new car and states that there may be some aggravation of patient's lower back lower extremity pain while sitting in the [facility]. Patient states that the appears to have been some improvement readjustment of the seat. The patient states that the lower back lower extremity by standing and walking. The patient also is with pain occurring along the greater trochanteric region and iliotibial band region. Discussed patient's condition and will consider patient for treatment including interventional treatment should there be persistent pain despite noninterventional treatment measures. The patient was understanding and agreed to suggested treatment plan. The patient will continue hydrocodone acetaminophen as planned this time  Review of Systems     Objective:   Physical Exam   There was tenderness of the splenius capitis and occipitalis musculatures and a moderate degree. There was limited range of motion of the cervical spine.  musculature region reproduced mild to moderate discomfort. Palpation of the splenius capitis and occipitalis musculature region reproduces moderate discomfort. There was unremarkable Spurling's maneuver. The patient appeared to be with decreased grip strength and Tinel and Phalen's maneuver were without increased pain of significant degree. There was tenderness of the acromioclavicular and glenohumeral joint region of moderate degree.  Lateral bending rotation extension and palpation of the lumbar facets reproduced moderate discomfort. Palpation of the thoracic facet thoracic paraspinal must reason was attends to palpation with no crepitus of the thoracic region noted. Palpation over the lumbar paraspinal must reason lumbar facet region was attends to palpation of moderate degree. Lateral bending rotation extension and palpation of the lumbar facets reproduce moderate discomfort palpation over the PSIS and PII S region reproduces moderate discomfort as well straight leg raising was limited to approximately 30 without increased pain with dorsiflexion noted. There was negative clonus negative Homans. DTRs were trace at the [facility] deficit or dermatomal distribution detected. There was negative clonus negative Homans. Abdomen nontender with no costovertebral tenderness noted     Assessment & Plan:     Degenerative changes C2-3, C3-4, C4-5, and C5-6, with multilevel degenerative changes of the cervical spine  Status post cervical fusion   Bilateral occipital neuralgia   Cervicogenic headache   Sacroiliac joint dysfunction   Degenerative disc disease lumbar spine  L3-4 L4-5 and L5-S1 degenerative changes     PLAN   Continue present medication hydrocodone acetaminophen and Robaxin  F/U PCP  Dr Derrel Nip for evaliation of  BP and general medical  condition  F/U surgical evaluation. May consider pending follow-up evaluations  F/U neurological evaluation. May consider pending follow-up evaluations  May consider radiofrequency rhizolysis or intraspinal procedures pending response to present treatment and F/U evaluation . We will avoid such procedures at this time  Patient to call Pain Management Center should patient have concerns prior to scheduled return appointment.

## 2015-06-14 NOTE — Progress Notes (Signed)
Safety precautions to be maintained throughout the outpatient stay will include: orient to surroundings, keep bed in low position, maintain call bell within reach at all times, provide assistance with transfer out of bed and ambulation.  

## 2015-06-14 NOTE — Patient Instructions (Signed)
PLAN   Continue present medication hydrocodone acetaminophen and Robaxin  F/U PCP  Dr Derrel Nip for evaliation of  BP and general medical  condition  F/U surgical evaluation. May consider pending follow-up evaluations  F/U neurological evaluation. May consider pending follow-up evaluations  May consider radiofrequency rhizolysis or intraspinal procedures pending response to present treatment and F/U evaluation . We will avoid such procedures at this time  Patient to call [facility] Management Center should patient have concerns prior to scheduled return appointment.

## 2015-06-21 ENCOUNTER — Ambulatory Visit (INDEPENDENT_AMBULATORY_CARE_PROVIDER_SITE_OTHER): Payer: BLUE CROSS/BLUE SHIELD | Admitting: *Deleted

## 2015-06-21 DIAGNOSIS — I429 Cardiomyopathy, unspecified: Secondary | ICD-10-CM

## 2015-06-21 DIAGNOSIS — I428 Other cardiomyopathies: Secondary | ICD-10-CM

## 2015-06-21 NOTE — Progress Notes (Signed)
Remote pacemaker transmission.   

## 2015-06-27 ENCOUNTER — Telehealth: Payer: Self-pay | Admitting: Internal Medicine

## 2015-06-27 ENCOUNTER — Other Ambulatory Visit: Payer: Self-pay | Admitting: Internal Medicine

## 2015-06-27 DIAGNOSIS — E119 Type 2 diabetes mellitus without complications: Secondary | ICD-10-CM

## 2015-06-27 DIAGNOSIS — E785 Hyperlipidemia, unspecified: Secondary | ICD-10-CM

## 2015-06-27 LAB — CUP PACEART REMOTE DEVICE CHECK
Battery Remaining Longevity: 58 mo
Brady Statistic AP VS Percent: 0.04 %
Brady Statistic RA Percent Paced: 0.29 %
Implantable Lead Implant Date: 20150212
Implantable Lead Implant Date: 20150212
Implantable Lead Location: 753858
Implantable Lead Location: 753860
Implantable Lead Model: 5076
Lead Channel Impedance Value: 380 Ohm
Lead Channel Impedance Value: 399 Ohm
Lead Channel Impedance Value: 456 Ohm
Lead Channel Impedance Value: 532 Ohm
Lead Channel Impedance Value: 608 Ohm
Lead Channel Pacing Threshold Amplitude: 0.625 V
Lead Channel Pacing Threshold Pulse Width: 0.4 ms
Lead Channel Sensing Intrinsic Amplitude: 16 mV
Lead Channel Sensing Intrinsic Amplitude: 2.375 mV
MDC IDC LEAD IMPLANT DT: 20150212
MDC IDC LEAD LOCATION: 753859
MDC IDC MSMT BATTERY VOLTAGE: 3 V
MDC IDC MSMT LEADCHNL LV IMPEDANCE VALUE: 399 Ohm
MDC IDC MSMT LEADCHNL LV IMPEDANCE VALUE: 513 Ohm
MDC IDC MSMT LEADCHNL LV IMPEDANCE VALUE: 589 Ohm
MDC IDC MSMT LEADCHNL LV PACING THRESHOLD AMPLITUDE: 1.875 V
MDC IDC MSMT LEADCHNL LV PACING THRESHOLD PULSEWIDTH: 0.4 ms
MDC IDC MSMT LEADCHNL RA PACING THRESHOLD AMPLITUDE: 0.5 V
MDC IDC MSMT LEADCHNL RA PACING THRESHOLD PULSEWIDTH: 0.4 ms
MDC IDC MSMT LEADCHNL RA SENSING INTR AMPL: 2.375 mV
MDC IDC MSMT LEADCHNL RV IMPEDANCE VALUE: 437 Ohm
MDC IDC MSMT LEADCHNL RV SENSING INTR AMPL: 16 mV
MDC IDC SESS DTM: 20170302194208
MDC IDC SET LEADCHNL LV PACING AMPLITUDE: 2.5 V
MDC IDC SET LEADCHNL LV PACING PULSEWIDTH: 0.8 ms
MDC IDC SET LEADCHNL RA PACING AMPLITUDE: 1.5 V
MDC IDC SET LEADCHNL RV PACING AMPLITUDE: 2 V
MDC IDC SET LEADCHNL RV PACING PULSEWIDTH: 0.4 ms
MDC IDC SET LEADCHNL RV SENSING SENSITIVITY: 0.9 mV
MDC IDC STAT BRADY AP VP PERCENT: 0.25 %
MDC IDC STAT BRADY AS VP PERCENT: 99.55 %
MDC IDC STAT BRADY AS VS PERCENT: 0.16 %
MDC IDC STAT BRADY RV PERCENT PACED: 99.8 %

## 2015-06-27 NOTE — Telephone Encounter (Signed)
Pt is questioning whether or not she is due to get lab work. Please advise, thanks

## 2015-06-27 NOTE — Telephone Encounter (Signed)
Pt had to resch her appt. Pt would like to know if she's due for lab work? Call pt @ [phone]. Leave mess if she does not answer. Thank you!

## 2015-06-27 NOTE — Telephone Encounter (Signed)
Fasting labs on or after march 19th NOT BEFORE

## 2015-06-28 NOTE — Telephone Encounter (Signed)
Notified pt , pt coming in 08/01/15 for blood work

## 2015-07-03 ENCOUNTER — Other Ambulatory Visit: Payer: Self-pay | Admitting: Pain Medicine

## 2015-07-04 ENCOUNTER — Encounter: Payer: Self-pay | Admitting: Cardiology

## 2015-07-11 ENCOUNTER — Ambulatory Visit: Payer: BLUE CROSS/BLUE SHIELD | Admitting: Internal Medicine

## 2015-07-12 ENCOUNTER — Encounter: Payer: BLUE CROSS/BLUE SHIELD | Admitting: Pain Medicine

## 2015-07-13 ENCOUNTER — Other Ambulatory Visit: Payer: Self-pay | Admitting: Internal Medicine

## 2015-07-13 NOTE — Telephone Encounter (Signed)
refilled 

## 2015-07-13 NOTE — Telephone Encounter (Signed)
This is from a historical provider, please advise?

## 2015-07-18 ENCOUNTER — Encounter: Payer: Self-pay | Admitting: Cardiology

## 2015-07-18 DIAGNOSIS — E782 Mixed hyperlipidemia: Secondary | ICD-10-CM | POA: Diagnosis not present

## 2015-07-18 DIAGNOSIS — I420 Dilated cardiomyopathy: Secondary | ICD-10-CM | POA: Diagnosis not present

## 2015-07-18 DIAGNOSIS — I10 Essential (primary) hypertension: Secondary | ICD-10-CM | POA: Diagnosis not present

## 2015-07-26 ENCOUNTER — Encounter: Payer: Self-pay | Admitting: Pain Medicine

## 2015-07-26 ENCOUNTER — Ambulatory Visit: Payer: BLUE CROSS/BLUE SHIELD | Attending: Pain Medicine | Admitting: Pain Medicine

## 2015-07-26 VITALS — BP 132/75 | HR 80 | Temp 98.0°F | Resp 16 | Ht 62.5 in | Wt 172.0 lb

## 2015-07-26 DIAGNOSIS — M5416 Radiculopathy, lumbar region: Secondary | ICD-10-CM | POA: Diagnosis not present

## 2015-07-26 DIAGNOSIS — M47812 Spondylosis without myelopathy or radiculopathy, cervical region: Secondary | ICD-10-CM | POA: Diagnosis not present

## 2015-07-26 DIAGNOSIS — M47817 Spondylosis without myelopathy or radiculopathy, lumbosacral region: Secondary | ICD-10-CM | POA: Diagnosis not present

## 2015-07-26 DIAGNOSIS — M5481 Occipital neuralgia: Secondary | ICD-10-CM

## 2015-07-26 DIAGNOSIS — M7061 Trochanteric bursitis, right hip: Secondary | ICD-10-CM

## 2015-07-26 DIAGNOSIS — M5010 Cervical disc disorder with radiculopathy, unspecified cervical region: Secondary | ICD-10-CM

## 2015-07-26 DIAGNOSIS — M5136 Other intervertebral disc degeneration, lumbar region: Secondary | ICD-10-CM

## 2015-07-26 DIAGNOSIS — M533 Sacrococcygeal disorders, not elsewhere classified: Secondary | ICD-10-CM

## 2015-07-26 DIAGNOSIS — M5030 Other cervical disc degeneration, unspecified cervical region: Secondary | ICD-10-CM

## 2015-07-26 DIAGNOSIS — M47816 Spondylosis without myelopathy or radiculopathy, lumbar region: Secondary | ICD-10-CM

## 2015-07-26 DIAGNOSIS — M51369 Other intervertebral disc degeneration, lumbar region without mention of lumbar back pain or lower extremity pain: Secondary | ICD-10-CM

## 2015-07-26 DIAGNOSIS — M7062 Trochanteric bursitis, left hip: Secondary | ICD-10-CM

## 2015-07-26 DIAGNOSIS — M5412 Radiculopathy, cervical region: Secondary | ICD-10-CM | POA: Diagnosis not present

## 2015-07-26 MED ORDER — METHOCARBAMOL 500 MG PO TABS: ORAL_TABLET | ORAL | Status: DC

## 2015-07-26 MED ORDER — HYDROCODONE-ACETAMINOPHEN 5-325 MG PO TABS: ORAL_TABLET | ORAL | Status: DC

## 2015-07-26 NOTE — Patient Instructions (Addendum)
PLAN   Continue present medication hydrocodone acetaminophen and Robaxin  F/U PCP  Dr Derrel Nip for evaliation of  BP and general medical  condition  F/U surgical evaluation. May consider pending follow-up evaluations  F/U neurological evaluation. May consider PNCV/EMG studies and other studies pending follow-up evaluations  Continue exercising and massage as we discussed  May consider radiofrequency rhizolysis or intraspinal procedures pending response to present treatment and F/U evaluation . We will avoid such procedures at this time  Patient to call Pain Management Center should patient have concerns prior to scheduled return appointment.Pain Management Discharge Instructions  General Discharge Instructions :  If you need to reach your doctor call: Monday-Friday 8:00 am - 4:00 pm at [phone] or toll free [phone].  After clinic hours [phone] to have operator reach doctor.  Bring all of your medication bottles to all your appointments in the pain clinic.  To cancel or reschedule your appointment with Pain Management please remember to call 24 hours in advance to avoid a fee.  Refer to the educational materials which you have been given on: General Risks, I had my Procedure. Discharge Instructions, Post Sedation.  Post Procedure Instructions:  The drugs you were given will stay in your system until tomorrow, so for the next 24 hours you should not drive, make any legal decisions or drink any alcoholic beverages.  You may eat anything you prefer, but it is better to start with liquids then soups and crackers, and gradually work up to solid foods.  Please notify your doctor immediately if you have any unusual bleeding, trouble breathing or pain that is not related to your normal pain.  Depending on the type of procedure that was done, some parts of your body may feel week and/or numb.  This usually clears up by tonight or the next day.  Walk with the use of an  assistive device or accompanied by an adult for the 24 hours.  You may use ice on the affected area for the first 24 hours.  Put ice in a Ziploc bag and cover with a towel and place against area 15 minutes on 15 minutes off.  You may switch to heat after 24 hours.

## 2015-07-26 NOTE — Progress Notes (Signed)
Safety precautions to be maintained throughout the outpatient stay will include: orient to surroundings, keep bed in low position, maintain call bell within reach at all times, provide assistance with transfer out of bed and ambulation.  

## 2015-07-26 NOTE — Progress Notes (Signed)
   Subjective:    Patient ID: Julia Pierce, female    DOB: 12/20/1955, 60 y.o.   MRN: [id number]  HPI  The patient is a 60 year old female who returns to pain management for further evaluation and treatment of pain involving the neck headaches upper extremity pain paresthesias and weakness as well as lower back and lower extremity pain. The patient states that the pain is fairly well-controlled at this time. The patient continues to exercise consisting of swimming and has undergone massage therapy as well patient continues medications consisting of hydrocodone acetaminophen at this time. We discussed interventional treatment as well as additional modifications of treatment regimen and we will continue with noninterventional treatment at this time. All agreed with suggested treatment plan the patient denied any trauma change in events of daily living because change in symptomatology. Patient admits to some upper back spasm as well as pain the back of the neck associated with some pain radiating to the back of the head sip dictating headaches. The patient denied any increased frequency of headaches and decision was made to continue with noninterventional treatment measures. We will continue hydrocodone acetaminophen as prescribed as well. All agreed to suggested treatment plan     Review of Systems     Objective:   Physical Exam  There was tenderness to palpation of paraspinal musculature region cervical region cervical facet region palpation which reproduces moderate discomfort. Palpation over the thoracic paraspinal must reason thoracic facet region was attends to palpation with moderate muscle spasm involving the thoracic paraspinal musculature region with no crepitus of the thoracic region noted. There was mild to moderate tenderness of the acromioclavicular and glenohumeral joint region with unremarkable Spurling's maneuver. Palpation over the thoracic facet thoracic paraspinal musculature region  was attends to palpation without crepitus of the thoracic region noted. Palpation over the lumbar paraspinal musculatures and lumbar facet region was attends to palpation of moderate degree with lateral bending rotation extension and palpation of the lumbar facets reproducing moderate discomfort. Straight leg raising was tolerated to 30 without an increase of pain with dorsiflexion noted. There was negative clonus negative Homans. DTRs difficult to elicit appeared to be trace at the knees. No sensory deficit or dermatomal distribution detected. There was negative clonus negative Homans. Abdomen nontender with no costovertebral angle tenderness noted.      Assessment & Plan:    Degenerative changes C2-3, C3-4, C4-5, and C5-6, with multilevel degenerative changes of the cervical spine  Status post cervical fusion   Bilateral occipital neuralgia   Cervicogenic headache   Sacroiliac joint dysfunction   Degenerative disc disease lumbar spine  L3-4 L4-5 and L5-S1 degenerative changes      PLAN   Continue present medication hydrocodone acetaminophen and Robaxin  F/U PCP  Dr Derrel Nip for evaliation of  BP and general medical  condition  F/U surgical evaluation. May consider pending follow-up evaluations  F/U neurological evaluation. May consider PNCV/EMG studies and other studies pending follow-up evaluations  Continue exercising and massage as we discussed  May consider radiofrequency rhizolysis or intraspinal procedures pending response to present treatment and F/U evaluation . We will avoid such procedures at this time  Patient to call Pain Management Center should patient have concerns prior to scheduled return appointment.

## 2015-08-01 ENCOUNTER — Other Ambulatory Visit (INDEPENDENT_AMBULATORY_CARE_PROVIDER_SITE_OTHER): Payer: BLUE CROSS/BLUE SHIELD

## 2015-08-01 DIAGNOSIS — E785 Hyperlipidemia, unspecified: Secondary | ICD-10-CM

## 2015-08-01 DIAGNOSIS — E119 Type 2 diabetes mellitus without complications: Secondary | ICD-10-CM | POA: Diagnosis not present

## 2015-08-01 LAB — LDL CHOLESTEROL, DIRECT: Direct LDL: 126 mg/dL

## 2015-08-01 LAB — COMPREHENSIVE METABOLIC PANEL
ALBUMIN: 4.3 g/dL (ref 3.5–5.2)
ALT: 21 U/L (ref 0–35)
AST: 21 U/L (ref 0–37)
Alkaline Phosphatase: 71 U/L (ref 39–117)
BILIRUBIN TOTAL: 0.3 mg/dL (ref 0.2–1.2)
BUN: 19 mg/dL (ref 6–23)
CALCIUM: 9.8 mg/dL (ref 8.4–10.5)
CO2: 28 meq/L (ref 19–32)
CREATININE: 0.73 mg/dL (ref 0.40–1.20)
Chloride: 103 mEq/L (ref 96–112)
GFR: 86.14 mL/min (ref >60.00)
Glucose, Bld: 152 mg/dL — ABNORMAL HIGH (ref 70–99)
Potassium: 4.1 mEq/L (ref 3.5–5.1)
Sodium: 141 mEq/L (ref 135–145)
TOTAL PROTEIN: 7.1 g/dL (ref 6.0–8.3)

## 2015-08-01 LAB — HEMOGLOBIN A1C: Hgb A1c MFr Bld: 7 % — ABNORMAL HIGH (ref 4.6–6.5)

## 2015-08-01 LAB — MICROALBUMIN / CREATININE URINE RATIO
CREATININE, U: 102.9 mg/dL
Microalb Creat Ratio: 0.7 mg/g (ref 0.0–30.0)

## 2015-08-01 LAB — LIPID PANEL
Cholesterol: 233 mg/dL — ABNORMAL HIGH (ref 0–200)
HDL: 38.3 mg/dL — ABNORMAL LOW (ref >39.00)
Total CHOL/HDL Ratio: 6
Triglycerides: 438 mg/dL — ABNORMAL HIGH (ref 0.0–149.0)

## 2015-08-05 ENCOUNTER — Encounter: Payer: Self-pay | Admitting: Internal Medicine

## 2015-08-06 ENCOUNTER — Other Ambulatory Visit: Payer: Self-pay | Admitting: Internal Medicine

## 2015-08-06 DIAGNOSIS — Z950 Presence of cardiac pacemaker: Secondary | ICD-10-CM | POA: Diagnosis not present

## 2015-08-06 DIAGNOSIS — I420 Dilated cardiomyopathy: Secondary | ICD-10-CM | POA: Diagnosis not present

## 2015-08-06 DIAGNOSIS — E782 Mixed hyperlipidemia: Secondary | ICD-10-CM | POA: Diagnosis not present

## 2015-08-06 DIAGNOSIS — I10 Essential (primary) hypertension: Secondary | ICD-10-CM | POA: Diagnosis not present

## 2015-08-06 NOTE — Telephone Encounter (Signed)
Will refill at upcoming appt on 08/08/15.

## 2015-08-08 ENCOUNTER — Encounter: Payer: Self-pay | Admitting: Internal Medicine

## 2015-08-08 ENCOUNTER — Ambulatory Visit (INDEPENDENT_AMBULATORY_CARE_PROVIDER_SITE_OTHER): Payer: BLUE CROSS/BLUE SHIELD | Admitting: Internal Medicine

## 2015-08-08 VITALS — BP 140/82 | HR 84 | Temp 97.9°F | Resp 12 | Ht 63.0 in | Wt 175.2 lb

## 2015-08-08 DIAGNOSIS — E119 Type 2 diabetes mellitus without complications: Secondary | ICD-10-CM

## 2015-08-08 DIAGNOSIS — E785 Hyperlipidemia, unspecified: Secondary | ICD-10-CM

## 2015-08-08 DIAGNOSIS — I4581 Long QT syndrome: Secondary | ICD-10-CM

## 2015-08-08 DIAGNOSIS — E663 Overweight: Secondary | ICD-10-CM

## 2015-08-08 DIAGNOSIS — G894 Chronic pain syndrome: Secondary | ICD-10-CM | POA: Diagnosis not present

## 2015-08-08 DIAGNOSIS — I5022 Chronic systolic (congestive) heart failure: Secondary | ICD-10-CM

## 2015-08-08 DIAGNOSIS — I10 Essential (primary) hypertension: Secondary | ICD-10-CM

## 2015-08-08 MED ORDER — GABAPENTIN 100 MG PO CAPS: 100.0000 mg | ORAL_CAPSULE | Freq: Three times a day (TID) | ORAL | Status: DC

## 2015-08-08 NOTE — Progress Notes (Signed)
Pre-visit discussion using our clinic review tool. No additional management support is needed unless otherwise documented below in the visit note.  

## 2015-08-08 NOTE — Progress Notes (Signed)
Subjective:  Patient ID: Julia Pierce, female    DOB: 09/11/1955  Age: 60 y.o. MRN: [id number]  CC: The primary encounter diagnosis was Chronic pain syndrome. Diagnoses of Chronic systolic heart failure ([place]), Diabetes mellitus type 2, diet-controlled ([place]), Essential hypertension, Long QT syndrome, Overweight (BMI 25.0-29.9), and Hyperlipidemia with target LDL less than 100 were also pertinent to this visit.  HPI Julia Pierce presents for FOLLOW UP ON [place] CONDITIONS INCLUDING  Diabetes , hypertesnions, obesity and cardiomyopathy.  3 month follow up on diabetes.  Patient has no complaints today.  Patient is following a low glycemic index/low sodium diet and taking all prescribed medications regularly without side effects.  Fasting sugars have been under less than 140 most of the time and post prandials have been under 160 except on rare occasions. Patient has not been exercising for the past several weeks and is not intentionally trying to lose weight .  Patient has had an eye exam in the last 12 months and checks feet regularly for signs of infection.  Patient does not walk barefoot outside,  And denies an numbness tingling or burning in feet. Patient is up to date on all recommended vaccinations   Recent labs reviewed form April 17 along with annual cardiology evaluation by [facility] Royals for cardiomyopathy, noniscehmeic with defib/pacer and EF 40% noted. By recent ECHO  Chronic neck and spine pain: managed by the Pain [facility].  She has been reducing her vicodin use to twice daily (nighttime dose omitted)   Outpatient Prescriptions Prior to Visit  Medication Sig Dispense Refill  . aspirin EC 81 MG tablet Take 81 mg by mouth daily.    . Azelaic Acid (FINACEA EX) Apply 1 application topically 2 (two) times daily.    . B Complex Vitamins (B COMPLEX PO) Take 1 tablet by mouth daily.     . calcium carbonate (OS-CAL) 600 MG TABS Take 600 mg by mouth daily.      . Cholecalciferol (VITAMIN D3)  2000 UNITS TABS Take 1 tablet by mouth daily.    . colchicine 0.6 MG tablet Take 0.6 mg by mouth 2 (two) times daily as needed (Gout).    Marland Kitchen esomeprazole (NEXIUM) 20 MG capsule Take 40 mg by mouth daily at 12 noon.    . fluticasone (FLONASE) 50 MCG/ACT nasal spray Place 2 sprays into both nostrils daily.    Marland Kitchen HYDROcodone-acetaminophen (NORCO/VICODIN) 5-325 MG tablet Limit 1-3 tabs by mouth daily if tolerated 80 tablet 0  . losartan-hydrochlorothiazide (HYZAAR) 50-12.5 MG tablet TAKE 1 TABLET BY MOUTH DAILY. 90 tablet 1  . methocarbamol (ROBAXIN) 500 MG tablet Limit 1 tablet by mouth per day or 2-3 times per day if tolerated 90 tablet 0  . metoprolol tartrate (LOPRESSOR) 25 MG tablet Reported on 05/17/2015    . Multiple Vitamin (MULTIVITAMIN) tablet Take 1 tablet by mouth daily.      . Omega-3 Fatty Acids (FISH OIL) 1000 MG CAPS Take 1 capsule by mouth 2 (two) times daily.    . sertraline (ZOLOFT) 100 MG tablet Take 1 tablet (100 mg total) by mouth daily. 90 tablet 3  . spironolactone (ALDACTONE) 25 MG tablet Take 25 mg by mouth daily.    Marland Kitchen tolterodine (DETROL) 2 MG tablet TAKE 1 TABLET TWICE A DAY 180 tablet 4  . furosemide (LASIX) 20 MG tablet Take 1 tablet (20 mg total) by mouth daily. As needed for fluid retention (Patient not taking: Reported on 08/08/2015) 30 tablet 0  . sertraline (ZOLOFT) 100  MG tablet TAKE 1 TABLET BY MOUTH EVERY DAY 90 tablet 1  . sertraline (ZOLOFT) 100 MG tablet TAKE 1 TABLET BY MOUTH EVERY DAY 90 tablet 1   Facility-Administered Medications Prior to Visit  Medication Dose Route Frequency Provider Last Rate Last Dose  . bupivacaine (PF) (MARCAINE) 0.25 % injection 30 mL  30 mL Other Once Mohammed Kindle, MD      . ciprofloxacin (CIPRO) IVPB 400 mg  400 mg Intravenous Once Mohammed Kindle, MD      . fentaNYL (SUBLIMAZE) injection 100 mcg  100 mcg Intravenous Once Mohammed Kindle, MD      . lactated ringers infusion 1,000 mL  1,000 mL Intravenous Continuous Mohammed Kindle, MD       . lidocaine (PF) (XYLOCAINE) 1 % injection 10 mL  10 mL Subcutaneous Once Mohammed Kindle, MD      . midazolam (VERSED) 5 MG/5ML injection 5 mg  5 mg Intravenous Once Mohammed Kindle, MD      . orphenadrine (NORFLEX) injection 60 mg  60 mg Intramuscular Once Mohammed Kindle, MD      . sodium chloride 0.9 % injection 20 mL  20 mL Other Once Mohammed Kindle, MD      . triamcinolone acetonide (KENALOG-40) injection 40 mg  40 mg Other Once Mohammed Kindle, MD        Review of Systems;  Patient denies headache, fevers, malaise, unintentional weight loss, skin rash, eye pain, sinus congestion and sinus pain, sore throat, dysphagia,  hemoptysis , cough, dyspnea, wheezing, chest pain, palpitations, orthopnea, edema, abdominal pain, nausea, melena, diarrhea, constipation, flank pain, dysuria, hematuria, urinary  Frequency, nocturia, numbness, tingling, seizures,  Focal weakness, Loss of consciousness,  Tremor, insomnia, depression, anxiety, and suicidal ideation.      Objective:  BP 140/82 mmHg  Pulse 84  Temp(Src) 97.9 F (36.6 C) (Oral)  Resp 12  Ht 5\' 3"  (1.6 m)  Wt 175 lb 4 oz (79.493 kg)  BMI 31.05 kg/m2  SpO2 97%  BP Readings from Last 3 Encounters:  08/08/15 140/82  07/26/15 132/75  06/14/15 145/82    Wt Readings from Last 3 Encounters:  08/08/15 175 lb 4 oz (79.493 kg)  07/26/15 172 lb (78.019 kg)  06/14/15 174 lb (78.926 kg)    General appearance: alert, cooperative and appears stated age Ears: normal TM's and external ear canals both ears Throat: lips, mucosa, and tongue normal; teeth and gums normal Neck: no adenopathy, no carotid bruit, supple, symmetrical, trachea midline and thyroid not enlarged, symmetric, no tenderness/mass/nodules Back: symmetric, no curvature. ROM normal. No CVA tenderness. Lungs: clear to auscultation bilaterally Heart: regular rate and rhythm, S1, S2 normal, no murmur, click, rub or gallop Abdomen: soft, non-tender; bowel sounds normal; no masses,  no  organomegaly Pulses: 2+ and symmetric Skin: Skin color, texture, turgor normal. No rashes or lesions Lymph nodes: Cervical, supraclavicular, and axillary nodes normal.  Lab Results  Component Value Date   HGBA1C 7.0* 08/01/2015   HGBA1C 7.1* 04/09/2015   HGBA1C 6.5 09/25/2014    Lab Results  Component Value Date   CREATININE 0.73 08/01/2015   CREATININE 0.74 04/09/2015   CREATININE 0.61 09/25/2014    Lab Results  Component Value Date   WBC 9.6 06/01/2013   HGB 12.9 06/01/2013   HCT 37.4 06/01/2013   PLT 204 06/01/2013   GLUCOSE 152* 08/01/2015   CHOL 233* 08/01/2015   TRIG * 08/01/2015    438.0 Triglyceride is over 400; calculations on Lipids are invalid.  HDL 38.30* 08/01/2015   LDLDIRECT 126.0 08/01/2015   LDLCALC 88 08/17/2013   ALT 21 08/01/2015   AST 21 08/01/2015   NA 141 08/01/2015   K 4.1 08/01/2015   CL 103 08/01/2015   CREATININE 0.73 08/01/2015   BUN 19 08/01/2015   CO2 28 08/01/2015   TSH 3.11 05/09/2013   HGBA1C 7.0* 08/01/2015   MICROALBUR <0.7 08/01/2015    No results found.  Assessment & Plan:   Problem List Items Addressed This Visit    Hypertension    Well controlled on current regimen. Renal function stable, no changes today.  Lab Results  Component Value Date   CREATININE 0.73 08/01/2015   Lab Results  Component Value Date   NA 141 08/01/2015   K 4.1 08/01/2015   CL 103 08/01/2015   CO2 28 08/01/2015             Long QT syndrome    S/p defibrillator/ biventricular pacer for syncopal episode in the setting of Left BBB with long QT. Continue metoprolol .  Annual follow up with cardiology done [facility]        Hyperlipidemia with target LDL less than 100    LDL is at goal, but her triglycerides continue to rise.  Weill consider adding fenofibrate at next check if still > 300.  Low GI diet recommended.  Lab Results  Component Value Date   CHOL 233* 08/01/2015   HDL 38.30* 08/01/2015   LDLCALC 88  08/17/2013   LDLDIRECT 126.0 08/01/2015   TRIG * 08/01/2015    438.0 Triglyceride is over 400; calculations on Lipids are invalid.   CHOLHDL 6 08/01/2015   Lab Results  Component Value Date   ALT 21 08/01/2015   AST 21 08/01/2015   ALKPHOS 71 08/01/2015   BILITOT 0.3 08/01/2015           Overweight (BMI 25.0-29.9)    I have addressed  BMI and recommended a low glycemic index diet utilizing smaller more frequent meals to increase metabolism.  I have also recommended that patient start exercising with a goal of 30 minutes of aerobic exercise a minimum of 5 days per week.      Diabetes mellitus type 2, diet-controlled ([place])    Well-controlled on diet historically, but hemoglobin A1c has risen slightly due to dietary noncompliance. .I have addressed  BMI and recommended wt loss of 10% of body weight over the next 6 months using a low glycemic index diet and resume her regular exercise a minimum of 5 days per week.  her foot exam is normal today.   Lab Results  Component Value Date   HGBA1C 7.0* 08/01/2015   Lab Results  Component Value Date   MICROALBUR <0.7 08/01/2015           Chronic systolic heart failure ([facility])    Secondary to dilated cardiomyopathy, s/p defib/pacer. n managed by [name]. Recent ECHO 40% EF / Managed medically. No edema on exam ,  weight is stable. Currently asymptomatic   Continue current medications.  avoid salt.  Continue regular exercise           Chronic pain syndrome - Primary    Managed with vicodin . Pain has improved; she has required no epidurals since she started swimming regularly for exercise.  Encouraged her to continue reducing her use of narcotics as she is doing. Adding gabapentin at night as a trial.  I am having Ms. Ciancio start on gabapentin. I am also having her maintain her calcium carbonate, multivitamin, spironolactone, sertraline, B Complex Vitamins (B COMPLEX PO), Azelaic Acid (FINACEA EX), Vitamin D3,  aspirin EC, esomeprazole, fluticasone, metoprolol tartrate, Fish Oil, colchicine, furosemide, losartan-hydrochlorothiazide, tolterodine, HYDROcodone-acetaminophen, and methocarbamol. We will continue to administer bupivacaine (PF), ciprofloxacin, fentaNYL, lactated ringers, lidocaine (PF), midazolam, orphenadrine, sodium chloride, and triamcinolone acetonide.  Meds ordered this encounter  Medications  . gabapentin (NEURONTIN) 100 MG capsule    Sig: Take 1 capsule (100 mg total) by mouth 3 (three) times daily. As needed for neuropathy    Dispense:  90 capsule    Refill:  3    Medications Discontinued During This Encounter  Medication Reason  . sertraline (ZOLOFT) 123XX123 MG tablet Duplicate  . sertraline (ZOLOFT) 123XX123 MG tablet Duplicate     A total of 25 minutes of face to face time was spent with patient more than half of which was spent in counselling about the above mentioned conditions  and coordination of care   Follow-up: Return in about 4 months (around 12/08/2015) for follow up diabetes.   Crecencio Mc, MD

## 2015-08-08 NOTE — Patient Instructions (Signed)
All of your labs are normal, except your triglycerides which is raising your total cholesterol .  This should improve with a low glycemic index diet and Exercise .  We should repeat in 3 months.   I am prescribing gabapentin to help with your nerve pain.  You can start with 100 mg at bedtime  and either increase the dose at bedtime up to 300 mg gradually OR:  you can add a morning and afternoon dose if needed  If you tolerate this medication and find that it helps your pain , we can increase the dose gradually as needed , up to 2400 mg daily    The most common side effects are fluid retention and dizziness.

## 2015-08-11 NOTE — Assessment & Plan Note (Signed)
Well controlled on current regimen. Renal function stable, no changes today.  Lab Results  Component Value Date   CREATININE 0.73 08/01/2015   Lab Results  Component Value Date   NA 141 08/01/2015   K 4.1 08/01/2015   CL 103 08/01/2015   CO2 28 08/01/2015

## 2015-08-11 NOTE — Assessment & Plan Note (Signed)
LDL is at goal, but her triglycerides continue to rise.  Weill consider adding fenofibrate at next check if still > 300.  Low GI diet recommended.  Lab Results  Component Value Date   CHOL 233* 08/01/2015   HDL 38.30* 08/01/2015   LDLCALC 88 08/17/2013   LDLDIRECT 126.0 08/01/2015   TRIG * 08/01/2015    438.0 Triglyceride is over 400; calculations on Lipids are invalid.   CHOLHDL 6 08/01/2015   Lab Results  Component Value Date   ALT 21 08/01/2015   AST 21 08/01/2015   ALKPHOS 71 08/01/2015   BILITOT 0.3 08/01/2015

## 2015-08-11 NOTE — Assessment & Plan Note (Addendum)
Well-controlled on diet historically, but hemoglobin A1c has risen slightly due to dietary noncompliance. .I have addressed  BMI and recommended wt loss of 10% of body weight over the next 6 months using a low glycemic index diet and resume her regular exercise a minimum of 5 days per week.  her foot exam is normal today.   Lab Results  Component Value Date   HGBA1C 7.0* 08/01/2015   Lab Results  Component Value Date   MICROALBUR <0.7 08/01/2015

## 2015-08-11 NOTE — Assessment & Plan Note (Signed)
I have addressed  BMI and recommended a low glycemic index diet utilizing smaller more frequent meals to increase metabolism.  I have also recommended that patient start exercising with a goal of 30 minutes of aerobic exercise a minimum of 5 days per week.  

## 2015-08-11 NOTE — Assessment & Plan Note (Addendum)
Managed with vicodin . Pain has improved; she has required no epidurals since she started swimming regularly for exercise.  Encouraged her to continue reducing her use of narcotics as she is doing. Adding gabapentin at night as a trial.

## 2015-08-11 NOTE — Assessment & Plan Note (Addendum)
Secondary to dilated cardiomyopathy, s/p defib/pacer. n managed by [name]. Recent ECHO 40% EF / Managed medically. No edema on exam ,  weight is stable. Currently asymptomatic   Continue current medications.  avoid salt.  Continue regular exercise

## 2015-08-11 NOTE — Assessment & Plan Note (Signed)
S/p defibrillator/ biventricular pacer for syncopal episode in the setting of Left BBB with long QT. Continue metoprolol .  Annual follow up with cardiology done [facility] 2017 [facility]

## 2015-08-21 ENCOUNTER — Encounter: Payer: Self-pay | Admitting: Pain Medicine

## 2015-08-21 ENCOUNTER — Ambulatory Visit: Payer: BLUE CROSS/BLUE SHIELD | Attending: Pain Medicine | Admitting: Pain Medicine

## 2015-08-21 VITALS — BP 120/71 | HR 74 | Temp 98.1°F | Resp 16 | Ht 62.5 in | Wt 172.0 lb

## 2015-08-21 DIAGNOSIS — M533 Sacrococcygeal disorders, not elsewhere classified: Secondary | ICD-10-CM | POA: Diagnosis not present

## 2015-08-21 DIAGNOSIS — M5136 Other intervertebral disc degeneration, lumbar region: Secondary | ICD-10-CM | POA: Diagnosis not present

## 2015-08-21 DIAGNOSIS — M5416 Radiculopathy, lumbar region: Secondary | ICD-10-CM | POA: Diagnosis not present

## 2015-08-21 DIAGNOSIS — M47812 Spondylosis without myelopathy or radiculopathy, cervical region: Secondary | ICD-10-CM | POA: Insufficient documentation

## 2015-08-21 DIAGNOSIS — Z981 Arthrodesis status: Secondary | ICD-10-CM | POA: Insufficient documentation

## 2015-08-21 DIAGNOSIS — M47816 Spondylosis without myelopathy or radiculopathy, lumbar region: Secondary | ICD-10-CM

## 2015-08-21 DIAGNOSIS — R51 Headache: Secondary | ICD-10-CM | POA: Diagnosis not present

## 2015-08-21 DIAGNOSIS — M79606 Pain in leg, unspecified: Secondary | ICD-10-CM | POA: Diagnosis present

## 2015-08-21 DIAGNOSIS — M5010 Cervical disc disorder with radiculopathy, unspecified cervical region: Secondary | ICD-10-CM

## 2015-08-21 DIAGNOSIS — M5481 Occipital neuralgia: Secondary | ICD-10-CM | POA: Insufficient documentation

## 2015-08-21 DIAGNOSIS — M542 Cervicalgia: Secondary | ICD-10-CM | POA: Diagnosis present

## 2015-08-21 DIAGNOSIS — M47817 Spondylosis without myelopathy or radiculopathy, lumbosacral region: Secondary | ICD-10-CM | POA: Diagnosis not present

## 2015-08-21 DIAGNOSIS — M7061 Trochanteric bursitis, right hip: Secondary | ICD-10-CM

## 2015-08-21 DIAGNOSIS — M5030 Other cervical disc degeneration, unspecified cervical region: Secondary | ICD-10-CM

## 2015-08-21 DIAGNOSIS — M546 Pain in thoracic spine: Secondary | ICD-10-CM | POA: Diagnosis present

## 2015-08-21 DIAGNOSIS — M5412 Radiculopathy, cervical region: Secondary | ICD-10-CM | POA: Diagnosis not present

## 2015-08-21 DIAGNOSIS — M7062 Trochanteric bursitis, left hip: Secondary | ICD-10-CM

## 2015-08-21 NOTE — Progress Notes (Signed)
Subjective:    Patient ID: Julia Pierce, female    DOB: [birth year], 60 y.o.   MRN: [id number]  HPI  The patient is a 60 year old female who returns to pain management Center for follow-up evaluation and treatment of pain involving the region of the neck entire back upper and lower extremity regions. The patient states that she has been caring for her granddaughter. The patient did admit to some pain occurring in the region of the shoulder as well as the region of the neck mid back region. The patient denies any other change in events of daily living the call significant change in symptomatology. We discussed patient's medication and patient has decreased the use of hydrocodone acetaminophen. The patient was advised to consider beginning the use of Neurontin. The patient has yet to begin her Neurontin. We informed patient that the Neurontin may be of significant benefit in terms of decreasing pain and decreasing the need to consider using hydrocodone acetaminophen. The use of methocarbamol (Robaxin) and we have advised patient to continue to swim and perform other exercises. We remain available to consider patient for interventional treatment should patient have significant pain have change in condition as discussed on today's visit. All agreed to suggested treatment plan  Review of Systems     Objective:   Physical Exam  There was tenderness to palpation of the splenius capitis and occipitalis musculature regions. Palpation of these regions reproduced pain of mild-to-moderate degree. There was mild to moderate tenderness of the cervical facet cervical paraspinal musculature region. Palpation of the acromioclavicular and glenohumeral joint regions reproduced pain of mild-to-moderate degree. The patient appeared to be with slightly decreased grip strength and Tinel and Phalen's maneuver were without increased pain of significant degree. Palpation of the thoracic region was with evidence of muscle spasm  with no crepitus of the thoracic region noted. Palpation over the lumbar paraspinal musculatures and lumbar facet region was attends to palpation of mild to moderate degree with lateral bending rotation extension and palpation of the lumbar facets reproducing mild to moderate discomfort. Straight leg raising was tolerated to 30 without increased pain with dorsiflexion noted. Palpation over the PSIS and PII S regions reproduce moderate discomfort with mild tenderness along the greater trochanteric region iliotibial band region. Definite sensory deficit or dermatomal distribution detected. There was negative clonus negative Homans. Abdomen was nontender with no costovertebral angle tenderness noted.      Assessment & Plan:      Degenerative changes C2-3, C3-4, C4-5, and C5-6, with multilevel degenerative changes of the cervical spine  Status post cervical fusion   Bilateral occipital neuralgia   Cervicogenic headache   Sacroiliac joint dysfunction   Degenerative disc disease lumbar spine  L3-4 L4-5 and L5-S1 degenerative changes      PLAN   Continue present medication hydrocodone acetaminophen and Robaxin  Recommend that you begin Neurontin (gabapentin) and remain aware of side effects such as respiratory depression, confusion, excessive sedation, and other side effects which can occur with Neurontin including swelling of extremities  F/U PCP  Dr Derrel Nip for evaliation of  BP pain of the hand as well as arthralgias and general medical  condition  F/U surgical evaluation. May consider pending follow-up evaluations  F/U neurological evaluation. May consider PNCV/EMG studies and other studies pending follow-up evaluations  Continue exercising and massage as we discussed  May consider radiofrequency rhizolysis or intraspinal procedures pending response to present treatment and F/U evaluation . We will avoid such procedures at this time  Patient to call Pain Management Center should  patient have concerns prior to scheduled return appointment

## 2015-08-21 NOTE — Progress Notes (Signed)
Safety precautions to be maintained throughout the outpatient stay will include: orient to surroundings, keep bed in low position, maintain call bell within reach at all times, provide assistance with transfer out of bed and ambulation.  

## 2015-08-21 NOTE — Patient Instructions (Addendum)
PLAN   Continue present medication hydrocodone acetaminophen and Robaxin  Recommend that you begin Neurontin (gabapentin) and remain aware of side effects such as respiratory depression, confusion, excessive sedation, and other side effects which can occur with Neurontin including swelling of extremities  F/U PCP  Dr Derrel Nip for evaliation of  BP pain of the hand as well as arthralgias and general medical  condition  F/U surgical evaluation. May consider pending follow-up evaluations  F/U neurological evaluation. May consider PNCV/EMG studies and other studies pending follow-up evaluations  Continue exercising and massage as we discussed  May consider radiofrequency rhizolysis or intraspinal procedures pending response to present treatment and F/U evaluation . We will avoid such procedures at this time  Patient to call Pain Management Center should patient have concerns prior to scheduled return appointment

## 2015-09-06 LAB — HM DIABETES EYE EXAM

## 2015-09-20 ENCOUNTER — Ambulatory Visit: Payer: BLUE CROSS/BLUE SHIELD | Attending: Pain Medicine | Admitting: Pain Medicine

## 2015-09-20 ENCOUNTER — Encounter: Payer: Self-pay | Admitting: Pain Medicine

## 2015-09-20 ENCOUNTER — Ambulatory Visit (INDEPENDENT_AMBULATORY_CARE_PROVIDER_SITE_OTHER): Payer: BLUE CROSS/BLUE SHIELD | Admitting: *Deleted

## 2015-09-20 VITALS — BP 121/69 | HR 75 | Temp 98.2°F | Resp 18 | Ht 62.0 in | Wt 172.0 lb

## 2015-09-20 DIAGNOSIS — R51 Headache: Secondary | ICD-10-CM | POA: Diagnosis not present

## 2015-09-20 DIAGNOSIS — M542 Cervicalgia: Secondary | ICD-10-CM | POA: Diagnosis present

## 2015-09-20 DIAGNOSIS — M5481 Occipital neuralgia: Secondary | ICD-10-CM | POA: Diagnosis not present

## 2015-09-20 DIAGNOSIS — M5136 Other intervertebral disc degeneration, lumbar region: Secondary | ICD-10-CM | POA: Diagnosis not present

## 2015-09-20 DIAGNOSIS — M7061 Trochanteric bursitis, right hip: Secondary | ICD-10-CM

## 2015-09-20 DIAGNOSIS — M533 Sacrococcygeal disorders, not elsewhere classified: Secondary | ICD-10-CM

## 2015-09-20 DIAGNOSIS — I429 Cardiomyopathy, unspecified: Secondary | ICD-10-CM

## 2015-09-20 DIAGNOSIS — M47817 Spondylosis without myelopathy or radiculopathy, lumbosacral region: Secondary | ICD-10-CM | POA: Diagnosis not present

## 2015-09-20 DIAGNOSIS — Z981 Arthrodesis status: Secondary | ICD-10-CM | POA: Diagnosis not present

## 2015-09-20 DIAGNOSIS — M5412 Radiculopathy, cervical region: Secondary | ICD-10-CM | POA: Diagnosis not present

## 2015-09-20 DIAGNOSIS — M5030 Other cervical disc degeneration, unspecified cervical region: Secondary | ICD-10-CM

## 2015-09-20 DIAGNOSIS — M79606 Pain in leg, unspecified: Secondary | ICD-10-CM | POA: Diagnosis present

## 2015-09-20 DIAGNOSIS — M5010 Cervical disc disorder with radiculopathy, unspecified cervical region: Secondary | ICD-10-CM | POA: Diagnosis not present

## 2015-09-20 DIAGNOSIS — I428 Other cardiomyopathies: Secondary | ICD-10-CM

## 2015-09-20 DIAGNOSIS — M545 Low back pain: Secondary | ICD-10-CM | POA: Diagnosis not present

## 2015-09-20 DIAGNOSIS — M47812 Spondylosis without myelopathy or radiculopathy, cervical region: Secondary | ICD-10-CM | POA: Diagnosis not present

## 2015-09-20 DIAGNOSIS — M7062 Trochanteric bursitis, left hip: Secondary | ICD-10-CM

## 2015-09-20 DIAGNOSIS — M546 Pain in thoracic spine: Secondary | ICD-10-CM | POA: Diagnosis present

## 2015-09-20 DIAGNOSIS — M47816 Spondylosis without myelopathy or radiculopathy, lumbar region: Secondary | ICD-10-CM

## 2015-09-20 DIAGNOSIS — M5416 Radiculopathy, lumbar region: Secondary | ICD-10-CM | POA: Diagnosis not present

## 2015-09-20 DIAGNOSIS — M5382 Other specified dorsopathies, cervical region: Secondary | ICD-10-CM | POA: Diagnosis not present

## 2015-09-20 MED ORDER — METHOCARBAMOL 500 MG PO TABS: ORAL_TABLET | ORAL | Status: DC

## 2015-09-20 MED ORDER — HYDROCODONE-ACETAMINOPHEN 5-325 MG PO TABS: ORAL_TABLET | ORAL | Status: DC

## 2015-09-20 MED ORDER — GABAPENTIN 100 MG PO CAPS: 100.0000 mg | ORAL_CAPSULE | Freq: Three times a day (TID) | ORAL | Status: DC

## 2015-09-20 NOTE — Progress Notes (Signed)
Safety precautions to be maintained throughout the outpatient stay will include: orient to surroundings, keep bed in low position, maintain call bell within reach at all times, provide assistance with transfer out of bed and ambulation.  

## 2015-09-20 NOTE — Patient Instructions (Addendum)
PLAN   Continue present medication hydrocodone acetaminophen Neurontin and Robaxin  Remain aware of side effects such as respiratory depression, confusion, excessive sedation, and other side effects which can occur with Neurontin and other medications including swelling of extremities Try taking 1 Neurontin at bedtime only for now  F/U PCP  Dr Derrel Nip for evaliation of  BP pain of the hand as well as arthralgias and general medical  condition  F/U surgical evaluation. May consider pending follow-up evaluations  F/U neurological evaluation. May consider PNCV/EMG studies and other studies pending follow-up evaluations  See podiatrist for evaluation of foot as discussed  Continue exercising and massage  May consider radiofrequency rhizolysis or intraspinal procedures pending response to present treatment and F/U evaluation . We will avoid such procedures at this time  Patient to call Pain Management Center should patient have concerns prior to scheduled return appointment

## 2015-09-20 NOTE — Progress Notes (Signed)
   Subjective:    Patient ID: Julia Pierce, female    DOB: 1955/07/03, 60 y.o.   MRN: [id number]  HPI  The patient is a 60 year old female who returns to pain management for further evaluation and treatment of pain involving the neck entire back upper and lower extremity regions. The patient also is complaining of pain involving the region of the foot. We discussed patient's condition and will continue medications as prescribed this time. The patient will follow-up with podiatrist regarding pain involving the region of the foot and we will remain available to consider modifications of treatment pending response to treatment and follow-up evaluation. Patient states the headache and pain of the neck upper extremity region as well as lower back lower extremity region is fairly well-controlled present time. All agreed to suggested treatment plan     Review of Systems     Objective:   Physical Exam   There was tenderness to palpation of paraspinal musculature region cervical region cervical facet region palpation which reproduces pain of mild-to-moderate degree with mild to moderate tenderness over the splenius capitis and occipitalis musculature regions. There was mild to moderate tenderness over the thoracic facet thoracic paraspinal musculature region without crepitus of the thoracic region noted. The patient appeared to be with moderate tenderness of the acromioclavicular and glenohumeral joint region and was with unremarkable Spurling's maneuver. Palpation over the thoracic paraspinal musculature region thoracic facet region was with moderate tenderness to palpation of muscle spasms noted. No crepitus of the thoracic region was noted. The patient appeared to be with slightly decreased grip strength with Tinel and Phalen's maneuver reproducing minimal discomfort. Palpation of the lumbar paraspinal musculatures and lumbar facet region was attends to palpation of moderate degree with lateral bending  rotation extension and palpation reproducing moderate discomfort there was kyphosis of the thoracic spine noted. Straight leg raise was tolerates approximately 30 without increased pain with dorsiflexion noted. DTRs appeared to be trace at the knees and EHL strength appeared to be slightly decreased. No sensory deficit or dermatomal distribution was detected. There was negative clonus negative Homans. Abdomen nontender with no costovertebral tenderness noted     Assessment & Plan:     Degenerative changes C2-3, C3-4, C4-5, and C5-6, with multilevel degenerative changes of the cervical spine  Status post cervical fusion   Bilateral occipital neuralgia   Cervicogenic headache   Sacroiliac joint dysfunction   Degenerative disc disease lumbar spine  L3-4 L4-5 and L5-S1 degenerative changes     PLAN   Continue present medication hydrocodone acetaminophen Neurontin and Robaxin  Remain aware of side effects such as respiratory depression, confusion, excessive sedation, and other side effects which can occur with Neurontin and other medications including swelling of extremities  Try taking 1 Neurontin at bedtime only for now  F/U PCP  Dr Derrel Nip for evaliation of  BP pain of the hand as well as arthralgias and general medical  condition  F/U surgical evaluation. May consider pending follow-up evaluations  F/U neurological evaluation. May consider PNCV/EMG studies and other studies pending follow-up evaluations  Continue exercising caution to avoid aggravation of symptoms  See podiatrist for evaluation of foot as discussed  Continue exercising and massage  May consider radiofrequency rhizolysis or intraspinal procedures pending response to present treatment and F/U evaluation . We will avoid such procedures at this time  Patient to call Pain Management Center should patient have concerns prior to scheduled return appointment

## 2015-09-20 NOTE — Progress Notes (Signed)
Remote pacemaker transmission.   

## 2015-09-27 LAB — TOXASSURE SELECT 13 (MW), URINE: PDF: 0

## 2015-09-27 NOTE — Progress Notes (Signed)
Quick Note:  Reviewed. ______ 

## 2015-10-04 LAB — CUP PACEART REMOTE DEVICE CHECK
Battery Voltage: 3 V
Brady Statistic AP VP Percent: 0.05 %
Brady Statistic AS VP Percent: 99.75 %
Brady Statistic AS VS Percent: 0.2 %
Brady Statistic RV Percent Paced: 99.8 %
Date Time Interrogation Session: 20170601175316
Implantable Lead Implant Date: 20150212
Implantable Lead Location: 753859
Implantable Lead Model: 5076
Lead Channel Impedance Value: 380 Ohm
Lead Channel Impedance Value: 437 Ohm
Lead Channel Impedance Value: 494 Ohm
Lead Channel Impedance Value: 513 Ohm
Lead Channel Impedance Value: 551 Ohm
Lead Channel Pacing Threshold Amplitude: 0.5 V
Lead Channel Pacing Threshold Amplitude: 1.875 V
Lead Channel Pacing Threshold Pulse Width: 0.4 ms
Lead Channel Pacing Threshold Pulse Width: 0.4 ms
Lead Channel Sensing Intrinsic Amplitude: 19.625 mV
Lead Channel Setting Pacing Amplitude: 1.5 V
Lead Channel Setting Pacing Amplitude: 2 V
Lead Channel Setting Pacing Amplitude: 2.5 V
Lead Channel Setting Pacing Pulse Width: 0.4 ms
Lead Channel Setting Pacing Pulse Width: 0.8 ms
MDC IDC LEAD IMPLANT DT: 20150212
MDC IDC LEAD IMPLANT DT: 20150212
MDC IDC LEAD LOCATION: 753858
MDC IDC LEAD LOCATION: 753860
MDC IDC MSMT BATTERY REMAINING LONGEVITY: 57 mo
MDC IDC MSMT LEADCHNL LV IMPEDANCE VALUE: 456 Ohm
MDC IDC MSMT LEADCHNL LV IMPEDANCE VALUE: 589 Ohm
MDC IDC MSMT LEADCHNL RA IMPEDANCE VALUE: 399 Ohm
MDC IDC MSMT LEADCHNL RA IMPEDANCE VALUE: 399 Ohm
MDC IDC MSMT LEADCHNL RA SENSING INTR AMPL: 0.5 mV
MDC IDC MSMT LEADCHNL RA SENSING INTR AMPL: 0.5 mV
MDC IDC MSMT LEADCHNL RV PACING THRESHOLD AMPLITUDE: 0.75 V
MDC IDC MSMT LEADCHNL RV PACING THRESHOLD PULSEWIDTH: 0.4 ms
MDC IDC MSMT LEADCHNL RV SENSING INTR AMPL: 19.625 mV
MDC IDC SET LEADCHNL RV SENSING SENSITIVITY: 0.9 mV
MDC IDC STAT BRADY AP VS PERCENT: 0 %
MDC IDC STAT BRADY RA PERCENT PACED: 0.05 %

## 2015-10-09 ENCOUNTER — Telehealth: Payer: Self-pay | Admitting: Pain Medicine

## 2015-10-09 NOTE — Telephone Encounter (Signed)
Patient has appointment for [facility], she needs to get detailed letter stating why she cannot attend [employer], left papers with information needed on shelf at [facility]es station, she needs to pick this up Wednesday afternoon or Thursday morning. Also would like to get name of podiatrist that Dr. Primus Bravo recommended, she has forgotten the name he told her

## 2015-10-24 ENCOUNTER — Ambulatory Visit: Payer: BLUE CROSS/BLUE SHIELD | Admitting: Pain Medicine

## 2015-10-25 ENCOUNTER — Ambulatory Visit: Payer: BLUE CROSS/BLUE SHIELD | Admitting: Pain Medicine

## 2015-10-26 ENCOUNTER — Encounter: Payer: Self-pay | Admitting: Pain Medicine

## 2015-10-26 ENCOUNTER — Ambulatory Visit: Payer: BLUE CROSS/BLUE SHIELD | Attending: Pain Medicine | Admitting: Pain Medicine

## 2015-10-26 VITALS — BP 118/64 | HR 81 | Temp 98.1°F | Resp 18 | Ht 62.0 in | Wt 174.0 lb

## 2015-10-26 DIAGNOSIS — R51 Headache: Secondary | ICD-10-CM | POA: Diagnosis not present

## 2015-10-26 DIAGNOSIS — M62830 Muscle spasm of back: Secondary | ICD-10-CM | POA: Insufficient documentation

## 2015-10-26 DIAGNOSIS — M542 Cervicalgia: Secondary | ICD-10-CM | POA: Diagnosis present

## 2015-10-26 DIAGNOSIS — M5136 Other intervertebral disc degeneration, lumbar region: Secondary | ICD-10-CM | POA: Diagnosis not present

## 2015-10-26 DIAGNOSIS — M47812 Spondylosis without myelopathy or radiculopathy, cervical region: Secondary | ICD-10-CM

## 2015-10-26 DIAGNOSIS — M47817 Spondylosis without myelopathy or radiculopathy, lumbosacral region: Secondary | ICD-10-CM | POA: Diagnosis not present

## 2015-10-26 DIAGNOSIS — M5010 Cervical disc disorder with radiculopathy, unspecified cervical region: Secondary | ICD-10-CM

## 2015-10-26 DIAGNOSIS — M7061 Trochanteric bursitis, right hip: Secondary | ICD-10-CM

## 2015-10-26 DIAGNOSIS — M5412 Radiculopathy, cervical region: Secondary | ICD-10-CM | POA: Diagnosis not present

## 2015-10-26 DIAGNOSIS — M5030 Other cervical disc degeneration, unspecified cervical region: Secondary | ICD-10-CM

## 2015-10-26 DIAGNOSIS — Z981 Arthrodesis status: Secondary | ICD-10-CM | POA: Insufficient documentation

## 2015-10-26 DIAGNOSIS — M5416 Radiculopathy, lumbar region: Secondary | ICD-10-CM | POA: Diagnosis not present

## 2015-10-26 DIAGNOSIS — M5481 Occipital neuralgia: Secondary | ICD-10-CM | POA: Diagnosis not present

## 2015-10-26 DIAGNOSIS — M545 Low back pain: Secondary | ICD-10-CM | POA: Diagnosis present

## 2015-10-26 DIAGNOSIS — M47816 Spondylosis without myelopathy or radiculopathy, lumbar region: Secondary | ICD-10-CM | POA: Diagnosis not present

## 2015-10-26 DIAGNOSIS — M533 Sacrococcygeal disorders, not elsewhere classified: Secondary | ICD-10-CM

## 2015-10-26 DIAGNOSIS — M7062 Trochanteric bursitis, left hip: Secondary | ICD-10-CM

## 2015-10-26 MED ORDER — METHOCARBAMOL 500 MG PO TABS: ORAL_TABLET | ORAL | Status: DC

## 2015-10-26 MED ORDER — HYDROCODONE-ACETAMINOPHEN 5-325 MG PO TABS: ORAL_TABLET | ORAL | Status: DC

## 2015-10-26 NOTE — Progress Notes (Signed)
Subjective:    Patient ID: Julia Pierce, female    DOB: November 28, 1955, 60 y.o.   MRN: [id number]  HPI   Patient is a 60 year old female who returns to pain management for further evaluation and treatment of pain involving the neck and upper extremity regions entire back lower back and lower extremity region. The patient will undergo further evaluation of pain of the hands with her primary care physician as discussed. The patient will follow-up with Dr.Tullo in this regard. The patient will also undergo evaluation by podiatrist for pain involving the region of the foot and has appointment already scheduled. We will continue medications hydrocodone acetaminophen as prescribed this time and we will remain available to consider modification of treatment regimen pending results of the evaluation by podiatrist as well as follow-up evaluation of arthritic pain of the hands and further evaluation of pain of the cervical upper extremity region and lumbar lower extremity region. All agreed to suggested treatment plan patient denies trauma change in events of daily living the call significant change in symptoms pathology.     Review of Systems     Objective:   Physical Exam  Palpation of the pain is Seated and occipitalis region reproduced pain of moderate degree. There was unremarkable Spurling's maneuver and patient was able to perform drop test with minimal difficulty. Patient was with decreased grip strength with Tinel and Phalen's maneuver reproducing mild to moderate discomfort discomfort to moderate discomfort. There was tenderness to palpation of the hands with no increased warmth erythema in the region of the hands. There was decreased grip strength noted. Palpation over the thoracic region was attends to palpation of moderate degree with moderate muscle spasm of the thoracic and lumbar region. Lateral bending rotation extension and palpation over the lumbar facets reproduce moderate discomfort.  Straight leg raising was tolerated to 20 without increase pain with dorsiflexion noted. No definite sensory deficit dermatomal dystrophy she was detected. DTRs were difficult to elicit. There was negative clonus negative Homans. There was no increased warmth erythema in the region of the knees and there was negative anterior and posterior drawer signs without ballottement of the patella. The abdomen was nontender with no costovertebral tenderness noted     Assessment & Plan:         Degenerative changes C2-3, C3-4, C4-5, and C5-6, with multilevel degenerative changes of the cervical spine  Status post cervical fusion   Bilateral occipital neuralgia   Cervicogenic headache   Sacroiliac joint dysfunction   Degenerative disc disease lumbar spine  L3-4 L4-5 and L5-S1 degenerative changes    PLAN   Continue present medication hydrocodone acetaminophen and Robaxin  Remain aware of side effects of medications such as respiratory depression, confusion, excessive sedation, and other side effects NO NEURONTIN. Patient has already stopped Neurontin  F/U PCP  Dr Derrel Nip for evaliation of  BP, pain of the hand, as well as arthralgias and general medical  condition Discuss seeing a rheumatologist when you see Dr.Tullo as we discussed  F/U surgical evaluation. May consider pending follow-up evaluations  F/U neurological evaluation. May consider PNCV/EMG studies and other studies pending follow-up evaluations  Rheumatological evaluation . Please discussed with Dr.Tullo as we discussed today  See podiatrist for evaluation of foot as discussed and as scheduled  Continue exercising and massage  May consider radiofrequency rhizolysis or intraspinal procedures pending response to present treatment and F/U evaluation . We will avoid such procedures at this time  Patient to call Pain Management Center should  patient have concerns prior to scheduled return appointment

## 2015-10-26 NOTE — Patient Instructions (Addendum)
PLAN   Continue present medication hydrocodone acetaminophen and Robaxin  Remain aware of side effects of medications such as respiratory depression, confusion, excessive sedation, and other side effects NO NEURONTIN. Patient has already stopped Neurontin  F/U PCP  Dr Derrel Nip for evaliation of  BP, pain of the hand, as well as arthralgias and general medical  condition Discuss seeing a rheumatologist when you see Dr.Tullo as we discussed  F/U surgical evaluation. May consider pending follow-up evaluations  F/U neurological evaluation. May consider PNCV/EMG studies and other studies pending follow-up evaluations  Rheumatological evaluation . Please discussed with Dr.Tullo as we discussed today  See podiatrist for evaluation of foot as discussed and as scheduled  Continue exercising and massage  May consider radiofrequency rhizolysis or intraspinal procedures pending response to present treatment and F/U evaluation . We will avoid such procedures at this time  Patient to call Pain Management Center should patient have concerns prior to scheduled return appointment

## 2015-10-26 NOTE — Progress Notes (Signed)
Safety precautions to be maintained throughout the outpatient stay will include: orient to surroundings, keep bed in low position, maintain call bell within reach at all times, provide assistance with transfer out of bed and ambulation.  

## 2015-10-29 DIAGNOSIS — Z09 Encounter for follow-up examination after completed treatment for conditions other than malignant neoplasm: Secondary | ICD-10-CM | POA: Diagnosis not present

## 2015-10-29 DIAGNOSIS — Z872 Personal history of diseases of the skin and subcutaneous tissue: Secondary | ICD-10-CM | POA: Diagnosis not present

## 2015-10-29 DIAGNOSIS — Z1283 Encounter for screening for malignant neoplasm of skin: Secondary | ICD-10-CM | POA: Diagnosis not present

## 2015-11-12 ENCOUNTER — Other Ambulatory Visit: Payer: Self-pay | Admitting: Surgical

## 2015-11-12 MED ORDER — LOSARTAN POTASSIUM-HCTZ 50-12.5 MG PO TABS: 1.0000 | ORAL_TABLET | Freq: Every day | ORAL | 1 refills | Status: DC

## 2015-11-12 NOTE — Telephone Encounter (Signed)
My Chart message sent

## 2015-11-15 ENCOUNTER — Ambulatory Visit (INDEPENDENT_AMBULATORY_CARE_PROVIDER_SITE_OTHER): Payer: Medicare Other

## 2015-11-15 ENCOUNTER — Encounter: Payer: Self-pay | Admitting: Podiatry

## 2015-11-15 ENCOUNTER — Ambulatory Visit (INDEPENDENT_AMBULATORY_CARE_PROVIDER_SITE_OTHER): Payer: BLUE CROSS/BLUE SHIELD | Admitting: Podiatry

## 2015-11-15 DIAGNOSIS — E782 Mixed hyperlipidemia: Secondary | ICD-10-CM | POA: Insufficient documentation

## 2015-11-15 DIAGNOSIS — M722 Plantar fascial fibromatosis: Secondary | ICD-10-CM

## 2015-11-15 DIAGNOSIS — Z950 Presence of cardiac pacemaker: Secondary | ICD-10-CM | POA: Insufficient documentation

## 2015-11-15 MED ORDER — MELOXICAM 15 MG PO TABS: 15.0000 mg | ORAL_TABLET | Freq: Every day | ORAL | 3 refills | Status: DC

## 2015-11-15 MED ORDER — METHYLPREDNISOLONE 4 MG PO TBPK
ORAL_TABLET | ORAL | 0 refills | Status: DC
Start: 2015-11-15 — End: 2016-01-10

## 2015-11-15 NOTE — Patient Instructions (Signed)

## 2015-11-15 NOTE — Progress Notes (Signed)
   Subjective:    Patient ID: Julia Pierce, female    DOB: [birth year], 60 y.o.   MRN: [id number]  HPI: She presents with a several month history of pain to her right foot. She states that mornings are particularly intense it is so painful that it makes her nauseous. She's been seen by other podiatrists in the past.    Review of Systems  Constitutional: Positive for fatigue.  HENT: Positive for sinus pressure.   Musculoskeletal: Positive for myalgias.  Psychiatric/Behavioral: The patient is nervous/anxious.        Objective:   Physical Exam: Vital signs stable alert and oriented 3. Pulses are palpable. Neurologic sensorium is intact per Semmes-Weinstein monofilament. Deep tendon reflexes are intact. Muscle strength +5 over 5 dorsiflexion plantar flexion is internal fevers all intrinsic musculature is intact. Orthopedic evaluation of a straight solid joints distal to the ankle for range of motion without crepitation. Cutaneous evaluation shows supple well-hydrated use no lesions or wounds. She has pain on palpation medial calcaneal tubercles bilateral. She also has pain on palpation of the forefoot right and Achilles right. Radiographs taken today demonstrate soft tissue increase in density of the plantar fascia calcaneal insertion site.          Assessment & Plan:  Plantar fasciitis right foot.  Plan: Discussed etiology pathology conservative or surgical therapies for her on a Medrol Dosepak to be followed by meloxicam. Encouraged her to watch her sugar very closely and adjust her diet accordingly. I injected the right heel today with local anesthetic and Kenalog. I also placed her in a plantar fascia brace and she already has a night splint. We discussed appropriate shoe gear stretching exercises ice therapy and sugar modifications. I will follow up with her in 1 month.

## 2015-11-15 NOTE — Progress Notes (Signed)
   Subjective:    Patient ID: Julia Pierce, female    DOB: Jan 15, 1956, 60 y.o.   MRN: [id number]  HPI    Review of Systems  All other systems reviewed and are negative.      Objective:   Physical Exam        Assessment & Plan:

## 2015-11-27 ENCOUNTER — Ambulatory Visit: Payer: BLUE CROSS/BLUE SHIELD | Attending: Pain Medicine | Admitting: Pain Medicine

## 2015-11-27 ENCOUNTER — Encounter: Payer: Self-pay | Admitting: Pain Medicine

## 2015-11-27 VITALS — BP 139/69 | HR 71 | Temp 98.3°F | Resp 16 | Ht 62.5 in | Wt 172.0 lb

## 2015-11-27 DIAGNOSIS — M79606 Pain in leg, unspecified: Secondary | ICD-10-CM | POA: Diagnosis present

## 2015-11-27 DIAGNOSIS — R51 Headache: Secondary | ICD-10-CM | POA: Diagnosis not present

## 2015-11-27 DIAGNOSIS — M5481 Occipital neuralgia: Secondary | ICD-10-CM

## 2015-11-27 DIAGNOSIS — M5416 Radiculopathy, lumbar region: Secondary | ICD-10-CM | POA: Diagnosis not present

## 2015-11-27 DIAGNOSIS — Z981 Arthrodesis status: Secondary | ICD-10-CM | POA: Diagnosis not present

## 2015-11-27 DIAGNOSIS — M62830 Muscle spasm of back: Secondary | ICD-10-CM | POA: Insufficient documentation

## 2015-11-27 DIAGNOSIS — M546 Pain in thoracic spine: Secondary | ICD-10-CM | POA: Diagnosis present

## 2015-11-27 DIAGNOSIS — M47812 Spondylosis without myelopathy or radiculopathy, cervical region: Secondary | ICD-10-CM

## 2015-11-27 DIAGNOSIS — M4802 Spinal stenosis, cervical region: Secondary | ICD-10-CM

## 2015-11-27 DIAGNOSIS — M19079 Primary osteoarthritis, unspecified ankle and foot: Secondary | ICD-10-CM

## 2015-11-27 DIAGNOSIS — M5030 Other cervical disc degeneration, unspecified cervical region: Secondary | ICD-10-CM

## 2015-11-27 DIAGNOSIS — M47816 Spondylosis without myelopathy or radiculopathy, lumbar region: Secondary | ICD-10-CM | POA: Insufficient documentation

## 2015-11-27 DIAGNOSIS — M5136 Other intervertebral disc degeneration, lumbar region: Secondary | ICD-10-CM | POA: Insufficient documentation

## 2015-11-27 DIAGNOSIS — M533 Sacrococcygeal disorders, not elsewhere classified: Secondary | ICD-10-CM | POA: Diagnosis not present

## 2015-11-27 DIAGNOSIS — M542 Cervicalgia: Secondary | ICD-10-CM | POA: Diagnosis present

## 2015-11-27 DIAGNOSIS — M5412 Radiculopathy, cervical region: Secondary | ICD-10-CM | POA: Diagnosis not present

## 2015-11-27 DIAGNOSIS — M47817 Spondylosis without myelopathy or radiculopathy, lumbosacral region: Secondary | ICD-10-CM | POA: Diagnosis not present

## 2015-11-27 MED ORDER — HYDROCODONE-ACETAMINOPHEN 5-325 MG PO TABS: ORAL_TABLET | ORAL | 0 refills | Status: DC

## 2015-11-27 MED ORDER — METHOCARBAMOL 500 MG PO TABS: ORAL_TABLET | ORAL | 0 refills | Status: DC

## 2015-11-27 MED ORDER — DICLOFENAC SODIUM 1 % TD GEL
4.0000 g | Freq: Four times a day (QID) | TRANSDERMAL | Status: AC
  Filled 2015-11-27: qty 100

## 2015-11-27 NOTE — Progress Notes (Signed)
Safety precautions to be maintained throughout the outpatient stay will include: orient to surroundings, keep bed in low position, maintain call bell within reach at all times, provide assistance with transfer out of bed and ambulation.  

## 2015-11-27 NOTE — Progress Notes (Signed)
The patient is a 60 year old female who returns to pain management for further evaluation and treatment of pain involving the neck entire back upper and lower extremity regions. The patient is with pain involving the region of the foot. The patient is undergone evaluation by her podiatrist and is wearing a brace of the ankle at this time. We will continue to observe patient's blocks to present treatment and patient is to call pain management should there be significant change in condition. The patient denies any other significant change in activities of daily living or any trauma. We will continue medications consisting of hydrocodone acetaminophen and Robaxin and we will remain available to consider modification of treatment regimen including interventional treatment pending response to treatment and follow-up evaluation. The patient denies any significant pain occurring the region Upper extremity regions at this time. The patient states that headaches appeared to be fairly well-controlled as well.     Physical examination   There was tends to palpation of paraspinal misreading cervical region cervical facet region palpation which reproduces mild to moderate discomfort with mild to moderate tenderness of the acromioclavicular and glenohumeral joint region. The patient was able to perform drop test without significant difficulty. There appeared to be unremarkable Spurling's maneuver. The patient appeared to be with bilaterally equal grip strength without increased pain of significant degree with Tinel and Phalen's maneuver. There was tenderness over the thoracic region without crepitus of the thoracic facet region. Palpation over the thoracic paraspinal musculature region was attends to palpation with moderate muscle spasms noted his well. Palpation over the lumbar region was attends to palpation of moderate degree with lateral bending rotation extension and palpation of the lumbar facets reproducing  moderate discomfort. Palpation over the region of the lumbar region was attends to palpation with lateral bending rotation extension and palpation of the lumbar facets reproducing mild to moderate discomfort. There was mild to moderate tenderness of the PSIS PII S region with mild tenderness along the greater trochanteric region iliotibial band region. Straight leg raising was tolerated to 30 without increased pain with dorsiflexion noted. There was negative Homans. There was brace of the ankle. Abdomen was nontender with no costovertebral tenderness noted     Assessment    Degenerative changes C2-3, C3-4, C4-5, and C5-6, with multilevel degenerative changes of the cervical spine  Status post cervical fusion   Bilateral occipital neuralgia   Cervicogenic headache   Sacroiliac joint dysfunction   Degenerative disc disease lumbar spine  L3-4 L4-5 and L5-S1 degenerative changes      PLAN   Continue present medication hydrocodone acetaminophen and Robaxin  Remain aware of side effects of medications such as respiratory depression, confusion, excessive sedation, and other side effects NO NEURONTIN. BEGIN Voltaren Gel applications to the ankle as discussed  F/U PCP  Dr Derrel Nip for evaliation of  BP, pain of the hand, as well as arthralgias and general medical  condition Discuss seeing a rheumatologist when you see Dr.Tullo as we previously discussed  F/U surgical evaluation. Follow-up evaluation of foot and ankle as planned  F/U neurological evaluation. May consider PNCV/EMG studies and other studies pending follow-up evaluations  Rheumatological evaluation . Please discussed with Dr.Tullo as we discussed today  F/U  with podiatrist for evaluation of foot as discussed and as scheduled  Continue exercising and massage  May consider radiofrequency rhizolysis or intraspinal procedures pending response to present treatment and F/U evaluation . We will avoid such procedures at this  time  Patient  to call Pain Management Center should patient have concerns prior to scheduled return appointment

## 2015-11-27 NOTE — Patient Instructions (Addendum)
PLAN   Continue present medication hydrocodone acetaminophen and Robaxin  Remain aware of side effects of medications such as respiratory depression, confusion, excessive sedation, and other side effects NO NEURONTIN. BEGIN Voltaren Gel applications to the ankle as discussed  F/U PCP  Dr Derrel Nip for evaliation of  BP, pain of the hand, as well as arthralgias and general medical  condition Discuss seeing a rheumatologist when you see Dr.Tullo as we previously discussed  F/U surgical evaluation. May consider pending follow-up evaluations  F/U neurological evaluation. May consider PNCV/EMG studies and other studies pending follow-up evaluations  Rheumatological evaluation . Please discussed with Dr.Tullo as we discussed today  See podiatrist for evaluation of foot as discussed and as scheduled  Continue exercising and massage  May consider radiofrequency rhizolysis or intraspinal procedures pending response to present treatment and F/U evaluation . We will avoid such procedures at this time  Patient to call Pain Management Center should patient have concerns prior to scheduled return appointment

## 2015-11-30 ENCOUNTER — Telehealth: Payer: Self-pay

## 2015-11-30 DIAGNOSIS — E119 Type 2 diabetes mellitus without complications: Secondary | ICD-10-CM

## 2015-11-30 DIAGNOSIS — I10 Essential (primary) hypertension: Secondary | ICD-10-CM

## 2015-11-30 DIAGNOSIS — E785 Hyperlipidemia, unspecified: Secondary | ICD-10-CM

## 2015-11-30 NOTE — Telephone Encounter (Signed)
Pt coming for fasting labs 12/03/15. Please place future orders. Thank you.

## 2015-12-03 ENCOUNTER — Other Ambulatory Visit (INDEPENDENT_AMBULATORY_CARE_PROVIDER_SITE_OTHER): Payer: BLUE CROSS/BLUE SHIELD

## 2015-12-03 DIAGNOSIS — R7989 Other specified abnormal findings of blood chemistry: Secondary | ICD-10-CM

## 2015-12-03 DIAGNOSIS — E785 Hyperlipidemia, unspecified: Secondary | ICD-10-CM

## 2015-12-03 DIAGNOSIS — E119 Type 2 diabetes mellitus without complications: Secondary | ICD-10-CM | POA: Diagnosis not present

## 2015-12-03 LAB — LIPID PANEL
CHOL/HDL RATIO: 6
CHOLESTEROL: 273 mg/dL — AB (ref 0–200)
HDL: 46.3 mg/dL (ref >39.00)
NONHDL: 226.85
TRIGLYCERIDES: 277 mg/dL — AB (ref 0.0–149.0)
VLDL: 55.4 mg/dL — ABNORMAL HIGH (ref 0.0–40.0)

## 2015-12-03 LAB — COMPREHENSIVE METABOLIC PANEL
ALBUMIN: 4.5 g/dL (ref 3.5–5.2)
ALT: 17 U/L (ref 0–35)
AST: 17 U/L (ref 0–37)
Alkaline Phosphatase: 63 U/L (ref 39–117)
BILIRUBIN TOTAL: 0.5 mg/dL (ref 0.2–1.2)
BUN: 20 mg/dL (ref 6–23)
CALCIUM: 9.8 mg/dL (ref 8.4–10.5)
CHLORIDE: 101 meq/L (ref 96–112)
CO2: 28 mEq/L (ref 19–32)
CREATININE: 0.8 mg/dL (ref 0.40–1.20)
GFR: 77.41 mL/min (ref >60.00)
Glucose, Bld: 149 mg/dL — ABNORMAL HIGH (ref 70–99)
Potassium: 4.4 mEq/L (ref 3.5–5.1)
SODIUM: 139 meq/L (ref 135–145)
Total Protein: 6.8 g/dL (ref 6.0–8.3)

## 2015-12-03 LAB — LDL CHOLESTEROL, DIRECT: Direct LDL: 174 mg/dL

## 2015-12-03 LAB — HEMOGLOBIN A1C: Hgb A1c MFr Bld: 7.1 % — ABNORMAL HIGH (ref 4.6–6.5)

## 2015-12-05 ENCOUNTER — Ambulatory Visit (INDEPENDENT_AMBULATORY_CARE_PROVIDER_SITE_OTHER): Payer: BLUE CROSS/BLUE SHIELD | Admitting: Internal Medicine

## 2015-12-05 ENCOUNTER — Encounter: Payer: Self-pay | Admitting: Internal Medicine

## 2015-12-05 ENCOUNTER — Telehealth: Payer: Self-pay | Admitting: Internal Medicine

## 2015-12-05 DIAGNOSIS — E119 Type 2 diabetes mellitus without complications: Secondary | ICD-10-CM

## 2015-12-05 DIAGNOSIS — E785 Hyperlipidemia, unspecified: Secondary | ICD-10-CM

## 2015-12-05 DIAGNOSIS — I420 Dilated cardiomyopathy: Secondary | ICD-10-CM | POA: Diagnosis not present

## 2015-12-05 DIAGNOSIS — Z950 Presence of cardiac pacemaker: Secondary | ICD-10-CM | POA: Diagnosis not present

## 2015-12-05 DIAGNOSIS — I10 Essential (primary) hypertension: Secondary | ICD-10-CM | POA: Diagnosis not present

## 2015-12-05 DIAGNOSIS — E663 Overweight: Secondary | ICD-10-CM | POA: Diagnosis not present

## 2015-12-05 DIAGNOSIS — E782 Mixed hyperlipidemia: Secondary | ICD-10-CM

## 2015-12-05 MED ORDER — RED YEAST RICE 600 MG PO CAPS: 1.0000 | ORAL_CAPSULE | Freq: Two times a day (BID) | ORAL | 2 refills | Status: DC

## 2015-12-05 NOTE — Patient Instructions (Addendum)
your cholesterol has risen slightly.   I am aware of your intolerance to statins (cholesterol drugs) but if you are willing to try Red Yeast Rice capsules, this ancient [place] pharmaceutical may help.  The dose is 600 mg twice daily .  I have sent an rx to your pharmacy .  The natural remedies for cholesterol have not been proven to reduce your risk for a heart attack,  But does lower  Your cholesterol.  I want you to lose 10 lbs by your next visit !!!

## 2015-12-05 NOTE — Progress Notes (Signed)
Subjective:  Patient ID: Julia Pierce, female    DOB: Jul 01, 1955  Age: 60 y.o. MRN: [id number]  CC: Diagnoses of Diabetes mellitus type 2, diet-controlled ([place]), Mixed hypertriglyceridemia, and Overweight (BMI 25.0-29.9) were pertinent to this visit.  HPI Rainah Flournoy presents for follow up on Type 2 DM, obesity,  hypertension and hyperlipidemia.  Has not been able to exercise due to foot pain. Sees Dr Milinda Pointer for plantar fasciitis and she is currently taking mobic after a prednisone taper.  Pain is improving but not resolved.  Had an injection in the medial side of arch which was done in July .   Patient has not lost any weight . LDL  Has risen  by 50 pts  Due to increased indulgence in chocolate ,  Biscuits and other breads  Has statin intolerance and fenofibrateintolerance  Morning stiffness of joints in hand, , heberdene's nodes noted.  Sometimes has episodes of sharp pain in feet   Taking meloxicam daily for the PF and nexium 40 mg daily   Foot exam normal        Lab Results  Component Value Date   HGBA1C 7.1 (H) 12/03/2015   Lab Results  Component Value Date   MICROALBUR <0.7 08/01/2015   Lab Results  Component Value Date   ALT 17 12/03/2015   AST 17 12/03/2015   ALKPHOS 63 12/03/2015   BILITOT 0.5 12/03/2015     Outpatient Medications Prior to Visit  Medication Sig Dispense Refill  . aspirin EC 81 MG tablet Take 81 mg by mouth daily.    . Azelaic Acid (FINACEA EX) Apply 1 application topically 2 (two) times daily.    . B Complex Vitamins (B COMPLEX PO) Take 1 tablet by mouth daily.     . calcium carbonate (OS-CAL) 600 MG TABS Take 600 mg by mouth daily.      . calcium elemental as carbonate (BARIATRIC TUMS ULTRA) 400 MG chewable tablet Chew by mouth.    . Cholecalciferol (VITAMIN D3) 2000 UNITS TABS Take 1 tablet by mouth daily.    . colchicine 0.6 MG tablet Take 0.6 mg by mouth 2 (two) times daily as needed (Gout). Reported on 09/20/2015    . esomeprazole  (NEXIUM) 20 MG capsule Take 40 mg by mouth daily at 12 noon.    . fluticasone (FLONASE) 50 MCG/ACT nasal spray Place 2 sprays into both nostrils daily.    . furosemide (LASIX) 20 MG tablet Take 1 tablet (20 mg total) by mouth daily. As needed for fluid retention 30 tablet 0  . gabapentin (NEURONTIN) 100 MG capsule TAKE 1 CAPSULE (100 MG TOTAL) BY MOUTH 3 (THREE) TIMES DAILY. AS NEEDED FOR NEUROPATHY  3  . HYDROcodone-acetaminophen (NORCO/VICODIN) 5-325 MG tablet Limit 1-3 tabs by mouth daily if tolerated 80 tablet 0  . losartan-hydrochlorothiazide (HYZAAR) 50-12.5 MG tablet Take 1 tablet by mouth daily. 90 tablet 1  . meloxicam (MOBIC) 15 MG tablet Take 1 tablet (15 mg total) by mouth daily. 30 tablet 3  . methocarbamol (ROBAXIN) 500 MG tablet Limit 1 tablet by mouth per day or 2-3 times per day if tolerated 90 tablet 0  . methylPREDNISolone (MEDROL) 4 MG TBPK tablet Tapering 6 day dose pack 21 tablet 0  . metoprolol tartrate (LOPRESSOR) 25 MG tablet Take 12.5 mg by mouth daily. Reported on 05/17/2015    . Multiple Vitamin (MULTIVITAMIN) tablet Take 1 tablet by mouth daily.      . Omega-3 Fatty Acids (FISH OIL) 1000 MG  CAPS Take 1 capsule by mouth 2 (two) times daily.    . sertraline (ZOLOFT) 100 MG tablet Take 1 tablet (100 mg total) by mouth daily. 90 tablet 3  . spironolactone (ALDACTONE) 25 MG tablet Take 25 mg by mouth daily.    Marland Kitchen tolterodine (DETROL) 2 MG tablet TAKE 1 TABLET TWICE A DAY 180 tablet 4   Facility-Administered Medications Prior to Visit  Medication Dose Route Frequency Provider Last Rate Last Dose  . bupivacaine (PF) (MARCAINE) 0.25 % injection 30 mL  30 mL Other Once Mohammed Kindle, MD      . ciprofloxacin (CIPRO) IVPB 400 mg  400 mg Intravenous Once Mohammed Kindle, MD      . diclofenac sodium (VOLTAREN) 1 % transdermal gel 4 g  4 g Topical QID Mohammed Kindle, MD      . fentaNYL (SUBLIMAZE) injection 100 mcg  100 mcg Intravenous Once Mohammed Kindle, MD      . lactated ringers  infusion 1,000 mL  1,000 mL Intravenous Continuous Mohammed Kindle, MD      . lidocaine (PF) (XYLOCAINE) 1 % injection 10 mL  10 mL Subcutaneous Once Mohammed Kindle, MD      . midazolam (VERSED) 5 MG/5ML injection 5 mg  5 mg Intravenous Once Mohammed Kindle, MD      . orphenadrine (NORFLEX) injection 60 mg  60 mg Intramuscular Once Mohammed Kindle, MD      . sodium chloride 0.9 % injection 20 mL  20 mL Other Once Mohammed Kindle, MD      . triamcinolone acetonide (KENALOG-40) injection 40 mg  40 mg Other Once Mohammed Kindle, MD        Review of Systems;  Patient denies headache, fevers, malaise, unintentional weight loss, skin rash, eye pain, sinus congestion and sinus pain, sore throat, dysphagia,  hemoptysis , cough, dyspnea, wheezing, chest pain, palpitations, orthopnea, edema, abdominal pain, nausea, melena, diarrhea, constipation, flank pain, dysuria, hematuria, urinary  Frequency, nocturia, numbness, tingling, seizures,  Focal weakness, Loss of consciousness,  Tremor, insomnia, depression, anxiety, and suicidal ideation.      Objective:  BP 136/78   Pulse 86   Temp 98.2 F (36.8 C) (Oral)   Resp 14   Ht 5\' 3"  (1.6 m)   Wt 172 lb 8 oz (78.2 kg)   SpO2 96%   BMI 30.56 kg/m   BP Readings from Last 3 Encounters:  12/05/15 136/78  11/27/15 139/69  10/26/15 118/64    Wt Readings from Last 3 Encounters:  12/05/15 172 lb 8 oz (78.2 kg)  11/27/15 172 lb (78 kg)  10/26/15 174 lb (78.9 kg)    General appearance: alert, cooperative and appears stated age Ears: normal TM's and external ear canals both ears Throat: lips, mucosa, and tongue normal; teeth and gums normal Neck: no adenopathy, no carotid bruit, supple, symmetrical, trachea midline and thyroid not enlarged, symmetric, no tenderness/mass/nodules Back: symmetric, no curvature. ROM normal. No CVA tenderness. Lungs: clear to auscultation bilaterally Heart: regular rate and rhythm, S1, S2 normal, no murmur, click, rub or  gallop Abdomen: soft, non-tender; bowel sounds normal; no masses,  no organomegaly Pulses: 2+ and symmetric Skin: Skin color, texture, turgor normal. No rashes or lesions Lymph nodes: Cervical, supraclavicular, and axillary nodes normal.  Lab Results  Component Value Date   HGBA1C 7.1 (H) 12/03/2015   HGBA1C 7.0 (H) 08/01/2015   HGBA1C 7.1 (H) 04/09/2015    Lab Results  Component Value Date   CREATININE 0.80 12/03/2015  CREATININE 0.73 08/01/2015   CREATININE 0.74 04/09/2015    Lab Results  Component Value Date   WBC 9.6 06/01/2013   HGB 12.9 06/01/2013   HCT 37.4 06/01/2013   PLT 204 06/01/2013   GLUCOSE 149 (H) 12/03/2015   CHOL 273 (H) 12/03/2015   TRIG 277.0 (H) 12/03/2015   HDL 46.30 12/03/2015   LDLDIRECT 174.0 12/03/2015   LDLCALC 88 08/17/2013   ALT 17 12/03/2015   AST 17 12/03/2015   NA 139 12/03/2015   K 4.4 12/03/2015   CL 101 12/03/2015   CREATININE 0.80 12/03/2015   BUN 20 12/03/2015   CO2 28 12/03/2015   TSH 3.11 05/09/2013   HGBA1C 7.1 (H) 12/03/2015   MICROALBUR <0.7 08/01/2015    No results found.  Assessment & Plan:   Problem List Items Addressed This Visit    Overweight (BMI 25.0-29.9)    I have addressed  BMI and recommended a low glycemic index diet utilizing smaller more frequent meals to increase metabolism.  I have also recommended that patient start exercising with a goal of 30 minutes of aerobic exercise a minimum of 5 days per week.      Diabetes mellitus type 2, diet-controlled ([place])    Well-controlled on diet historically, but hemoglobin A1c has risen slightly due to dietary noncompliance. .I have addressed  BMI and recommended wt loss of 10% of body weight over the next 6 months using a low glycemic index diet and resume her regular exercise a minimum of 5 days per week.  her foot exam is normal today.   Lab Results  Component Value Date   HGBA1C 7.1 (H) 12/03/2015   Lab Results  Component Value Date   MICROALBUR <0.7  08/01/2015           Mixed hypertriglyceridemia    LDL is at goal, but her triglycerides continue to rise.  She has intolerance to fenofibrate. .  Low GI diet and trial of Red Yeast Rice recommended  Lab Results  Component Value Date   CHOL 273 (H) 12/03/2015   HDL 46.30 12/03/2015   LDLCALC 88 08/17/2013   LDLDIRECT 174.0 12/03/2015   TRIG 277.0 (H) 12/03/2015   CHOLHDL 6 12/03/2015   Lab Results  Component Value Date   ALT 17 12/03/2015   AST 17 12/03/2015   ALKPHOS 63 12/03/2015   BILITOT 0.5 12/03/2015            Other Visit Diagnoses   None.     I am having Ms. Derksen start on [employer]. I am also having her maintain her calcium carbonate, multivitamin, spironolactone, sertraline, B Complex Vitamins (B COMPLEX PO), Azelaic Acid (FINACEA EX), Vitamin D3, aspirin EC, esomeprazole, fluticasone, metoprolol tartrate, Fish Oil, colchicine, furosemide, tolterodine, losartan-hydrochlorothiazide, calcium elemental as carbonate, gabapentin, methylPREDNISolone, meloxicam, HYDROcodone-acetaminophen, and methocarbamol. We will continue to administer bupivacaine (PF), ciprofloxacin, fentaNYL, lactated ringers, lidocaine (PF), midazolam, orphenadrine, sodium chloride, triamcinolone acetonide, and diclofenac sodium.  Meds ordered this encounter  Medications  . Red Yeast Rice 600 MG CAPS    Sig: Take 1 capsule (600 mg total) by mouth 2 (two) times daily.    Dispense:  60 capsule    Refill:  2    There are no discontinued medications.  Follow-up: Return in about 3 months (around 03/06/2016) for follow up diabetes.   Crecencio Mc, MD

## 2015-12-05 NOTE — Telephone Encounter (Signed)
Pt requested to get labs done by her next appt. Pt is scheduled for labs on 11/13. Need orders please and thank you!

## 2015-12-05 NOTE — Progress Notes (Signed)
Pre-visit discussion using our clinic review tool. No additional management support is needed unless otherwise documented below in the visit note.  

## 2015-12-05 NOTE — Telephone Encounter (Signed)
Last labs were on 8/14, please advise and ordered if needed prior to her November appt. thanks

## 2015-12-08 NOTE — Assessment & Plan Note (Signed)
Well-controlled on diet historically, but hemoglobin A1c has risen slightly due to dietary noncompliance. .I have addressed  BMI and recommended wt loss of 10% of body weight over the next 6 months using a low glycemic index diet and resume her regular exercise a minimum of 5 days per week.  her foot exam is normal today.   Lab Results  Component Value Date   HGBA1C 7.1 (H) 12/03/2015   Lab Results  Component Value Date   MICROALBUR <0.7 08/01/2015

## 2015-12-08 NOTE — Assessment & Plan Note (Signed)
I have addressed  BMI and recommended a low glycemic index diet utilizing smaller more frequent meals to increase metabolism.  I have also recommended that patient start exercising with a goal of 30 minutes of aerobic exercise a minimum of 5 days per week.  

## 2015-12-08 NOTE — Assessment & Plan Note (Addendum)
LDL is at goal, but her triglycerides continue to rise.  She has intolerance to fenofibrate. .  Low GI diet and trial of Red Yeast Rice recommended  Lab Results  Component Value Date   CHOL 273 (H) 12/03/2015   HDL 46.30 12/03/2015   LDLCALC 88 08/17/2013   LDLDIRECT 174.0 12/03/2015   TRIG 277.0 (H) 12/03/2015   CHOLHDL 6 12/03/2015   Lab Results  Component Value Date   ALT 17 12/03/2015   AST 17 12/03/2015   ALKPHOS 63 12/03/2015   BILITOT 0.5 12/03/2015

## 2015-12-13 ENCOUNTER — Encounter: Payer: Self-pay | Admitting: Podiatry

## 2015-12-13 ENCOUNTER — Ambulatory Visit (INDEPENDENT_AMBULATORY_CARE_PROVIDER_SITE_OTHER): Payer: BLUE CROSS/BLUE SHIELD | Admitting: Podiatry

## 2015-12-13 DIAGNOSIS — M722 Plantar fascial fibromatosis: Secondary | ICD-10-CM

## 2015-12-13 NOTE — Progress Notes (Signed)
She presents today to complaint of pain to the right heel. She states is doing a little better but still tender.  Objective: Pulses are palpable pain positive pain on palpation mucogingival the right heel.  Assessment: Fasciitis right.  Plan: Injected the area today with prolonged local anesthetic follow-up with me in 1 month continue all conservative therapies including braces night splint. Continue use of appropriate shoe gear stretching exercise ice therapy sheet of modifications.

## 2015-12-19 ENCOUNTER — Telehealth: Payer: Self-pay | Admitting: *Deleted

## 2015-12-20 ENCOUNTER — Ambulatory Visit (INDEPENDENT_AMBULATORY_CARE_PROVIDER_SITE_OTHER): Payer: BLUE CROSS/BLUE SHIELD | Admitting: *Deleted

## 2015-12-20 DIAGNOSIS — I429 Cardiomyopathy, unspecified: Secondary | ICD-10-CM

## 2015-12-20 DIAGNOSIS — I428 Other cardiomyopathies: Secondary | ICD-10-CM

## 2015-12-20 NOTE — Progress Notes (Signed)
Remote pacemaker transmission.   

## 2015-12-21 ENCOUNTER — Encounter: Payer: Self-pay | Admitting: Cardiology

## 2015-12-25 LAB — CUP PACEART REMOTE DEVICE CHECK
Battery Voltage: 3 V
Brady Statistic AP VP Percent: 0.06 %
Brady Statistic AS VP Percent: 99.71 %
Brady Statistic AS VS Percent: 0.22 %
Implantable Lead Implant Date: 20150212
Implantable Lead Implant Date: 20150212
Implantable Lead Location: 753859
Implantable Lead Model: 5076
Lead Channel Impedance Value: 418 Ohm
Lead Channel Impedance Value: 418 Ohm
Lead Channel Impedance Value: 456 Ohm
Lead Channel Impedance Value: 475 Ohm
Lead Channel Impedance Value: 532 Ohm
Lead Channel Pacing Threshold Amplitude: 0.5 V
Lead Channel Pacing Threshold Amplitude: 1.875 V
Lead Channel Pacing Threshold Pulse Width: 0.4 ms
Lead Channel Pacing Threshold Pulse Width: 0.4 ms
Lead Channel Sensing Intrinsic Amplitude: 14.625 mV
Lead Channel Setting Pacing Amplitude: 1.5 V
Lead Channel Setting Pacing Amplitude: 2.5 V
Lead Channel Setting Pacing Pulse Width: 0.4 ms
Lead Channel Setting Sensing Sensitivity: 0.9 mV
MDC IDC LEAD IMPLANT DT: 20150212
MDC IDC LEAD LOCATION: 753858
MDC IDC LEAD LOCATION: 753860
MDC IDC MSMT BATTERY REMAINING LONGEVITY: 57 mo
MDC IDC MSMT LEADCHNL LV IMPEDANCE VALUE: 342 Ohm
MDC IDC MSMT LEADCHNL LV IMPEDANCE VALUE: 551 Ohm
MDC IDC MSMT LEADCHNL RA IMPEDANCE VALUE: 380 Ohm
MDC IDC MSMT LEADCHNL RA IMPEDANCE VALUE: 399 Ohm
MDC IDC MSMT LEADCHNL RA SENSING INTR AMPL: 1.625 mV
MDC IDC MSMT LEADCHNL RA SENSING INTR AMPL: 1.625 mV
MDC IDC MSMT LEADCHNL RV PACING THRESHOLD AMPLITUDE: 0.75 V
MDC IDC MSMT LEADCHNL RV PACING THRESHOLD PULSEWIDTH: 0.4 ms
MDC IDC MSMT LEADCHNL RV SENSING INTR AMPL: 14.625 mV
MDC IDC SESS DTM: 20170831174629
MDC IDC SET LEADCHNL LV PACING PULSEWIDTH: 0.8 ms
MDC IDC SET LEADCHNL RV PACING AMPLITUDE: 2 V
MDC IDC STAT BRADY AP VS PERCENT: 0.01 %
MDC IDC STAT BRADY RA PERCENT PACED: 0.07 %
MDC IDC STAT BRADY RV PERCENT PACED: 99.77 %

## 2015-12-31 ENCOUNTER — Ambulatory Visit: Payer: BLUE CROSS/BLUE SHIELD | Attending: Pain Medicine | Admitting: Pain Medicine

## 2015-12-31 ENCOUNTER — Encounter: Payer: Self-pay | Admitting: Pain Medicine

## 2015-12-31 VITALS — BP 127/78 | HR 77 | Temp 98.4°F | Resp 16 | Ht 62.5 in | Wt 172.0 lb

## 2015-12-31 DIAGNOSIS — M62830 Muscle spasm of back: Secondary | ICD-10-CM | POA: Insufficient documentation

## 2015-12-31 DIAGNOSIS — M47816 Spondylosis without myelopathy or radiculopathy, lumbar region: Secondary | ICD-10-CM | POA: Insufficient documentation

## 2015-12-31 DIAGNOSIS — M5412 Radiculopathy, cervical region: Secondary | ICD-10-CM | POA: Diagnosis not present

## 2015-12-31 DIAGNOSIS — M47817 Spondylosis without myelopathy or radiculopathy, lumbosacral region: Secondary | ICD-10-CM | POA: Diagnosis not present

## 2015-12-31 DIAGNOSIS — R51 Headache: Secondary | ICD-10-CM | POA: Insufficient documentation

## 2015-12-31 DIAGNOSIS — M5416 Radiculopathy, lumbar region: Secondary | ICD-10-CM | POA: Diagnosis not present

## 2015-12-31 DIAGNOSIS — M533 Sacrococcygeal disorders, not elsewhere classified: Secondary | ICD-10-CM | POA: Diagnosis not present

## 2015-12-31 DIAGNOSIS — M545 Low back pain: Secondary | ICD-10-CM | POA: Diagnosis present

## 2015-12-31 DIAGNOSIS — M5030 Other cervical disc degeneration, unspecified cervical region: Secondary | ICD-10-CM

## 2015-12-31 DIAGNOSIS — M5481 Occipital neuralgia: Secondary | ICD-10-CM | POA: Diagnosis not present

## 2015-12-31 DIAGNOSIS — Z981 Arthrodesis status: Secondary | ICD-10-CM | POA: Insufficient documentation

## 2015-12-31 DIAGNOSIS — M47812 Spondylosis without myelopathy or radiculopathy, cervical region: Secondary | ICD-10-CM | POA: Diagnosis not present

## 2015-12-31 DIAGNOSIS — M5136 Other intervertebral disc degeneration, lumbar region: Secondary | ICD-10-CM | POA: Insufficient documentation

## 2015-12-31 DIAGNOSIS — M542 Cervicalgia: Secondary | ICD-10-CM | POA: Diagnosis present

## 2015-12-31 MED ORDER — METHOCARBAMOL 500 MG PO TABS: ORAL_TABLET | ORAL | 0 refills | Status: DC

## 2015-12-31 MED ORDER — HYDROCODONE-ACETAMINOPHEN 5-325 MG PO TABS: ORAL_TABLET | ORAL | 0 refills | Status: DC

## 2015-12-31 NOTE — Patient Instructions (Signed)
PLAN   Continue present medication hydrocodone acetaminophen and Robaxin  Remain aware of side effects of medications such as respiratory depression, confusion, excessive sedation, and other side effects NO NEURONTIN. Apply Voltaren Gel applications to the ankle as discussed  F/U PCP  Dr Derrel Nip for evaliation of  BP, pain of the hand, as well as arthralgias and general medical  condition Discuss seeing a rheumatologist when you see Dr.Tullo as we previously discussed  F/U surgical evaluation. May consider pending follow-up evaluations  F/U neurological evaluation. May consider PNCV/EMG studies and other studies pending follow-up evaluations  Rheumatological evaluation . Please discussed with Dr.Tullo as we discussed today  See podiatrist for evaluation of foot as discussed and as scheduled  Continue exercising and massage  May consider radiofrequency rhizolysis or intraspinal procedures pending response to present treatment and F/U evaluation . We will avoid such procedures at this time  Patient to call [facility] Center should patient have concerns prior to scheduled return appointment

## 2015-12-31 NOTE — Progress Notes (Signed)
Patient here for medication management.  Patient is currently being treated for plantar fasciatis for which she receives injections and is wearing a brace  Safety precautions to be maintained throughout the outpatient stay will include: orient to surroundings, keep bed in low position, maintain call bell within reach at all times, provide assistance with transfer out of bed and ambulation.

## 2016-01-01 ENCOUNTER — Ambulatory Visit: Payer: BLUE CROSS/BLUE SHIELD | Admitting: Pain Medicine

## 2016-01-01 NOTE — Progress Notes (Signed)
     The patient is a 60 year old female who returns to pain management for further evaluation and treatment of pain involving the neck upper extremity region and lower back lower extremity region. The patient admits to pain radiating from the neck to the back of the head causing headaches as well as pain occurring across the upper back region. Patient is without trauma. The patient states that the pain is aggravated by standing walking reaching and lifting pushing maneuvers. We discussed aquatic therapy and patient will avoid kicking maneuvers in the water to treatment her condition of the. We remain consider modification of treatment regimen pending follow-up evaluation. The patient will continue hydrocodone acetaminophen and Robaxin and we will remain available to consider interventional treatment as well as modification relates patient treatment regimen. All agreed to suggested treatment plan.      Physical examination  Palpation of the cervical facet cervical paraspinal musculature region reproduces moderate discomfort. There was moderate tenderness of the splenius capitis and occipitalis region. Palpation over the thoracic region was with evidence of muscle spasm and without crepitus of the thoracic region noted. The patient was with out significant increase of pain with Tinel and Phalen's maneuver. The patient appeared to be with slightly decreased grip strength. There was tenderness over the region of the lumbar paraspinal muscular treat and lumbar facet region a moderate degree with lateral bending rotation increasing pain to a moderate degree. There was no crepitus of the thoracic region noted and palpation of the PSIS and PII S region reproduces moderate discomfort. Straight leg raising was tolerated to 30 without an increase of pain with dorsiflexion noted. His appeared to be trace at the knees. No sensory deficit of dermatomal distribution detected. There was decreased EHL strength with no  sensory deficit or dermatomal distribution detected. There was negative clonus negative Homans There was negative clonus negative Homans. Abdomen without excessive tenderness to palpation and no costovertebral tenderness noted.      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 Apply Voltaren Gel applications to the ankle as discussed  F/U PCP  Dr Derrel Nip for evaliation of  BP, pain of the hand, as well as arthralgias and general medical  condition Discuss seeing a rheumatologist when you see Dr.Tullo as we previously discussed  F/U surgical evaluation. May consider pending follow-up evaluations  F/U neurological evaluation. May consider PNCV/EMG studies and other studies pending follow-up evaluations  Rheumatological evaluation . Please discussed with Dr.Tullo as we discussed today  See podiatrist for evaluation of foot as discussed and as scheduled  Continue exercising and massage  May consider radiofrequency rhizolysis or intraspinal procedures pending response to present treatment and F/U evaluation . We will avoid such procedures at this time  Patient to call Pain Management Center should patient have concerns prior to scheduled return appointment

## 2016-01-10 ENCOUNTER — Ambulatory Visit (INDEPENDENT_AMBULATORY_CARE_PROVIDER_SITE_OTHER): Payer: BLUE CROSS/BLUE SHIELD | Admitting: Podiatry

## 2016-01-10 DIAGNOSIS — M722 Plantar fascial fibromatosis: Secondary | ICD-10-CM | POA: Diagnosis not present

## 2016-01-10 MED ORDER — DICLOFENAC SODIUM 1 % TD GEL: 4.0000 g | Freq: Four times a day (QID) | TRANSDERMAL | 3 refills | Status: DC

## 2016-01-10 NOTE — Progress Notes (Signed)
She presents today for follow-up of plantar fasciitis of the right foot she states is doing much better but there is still some stinging present. She continues also current current therapies.  Objective: Signs are stable she's alert and oriented 3. No reproducible pain on palpation today.  Assessment: Well-healing plantar fasciitis 60-80% improved.  Plan: Continue conservative therapies and I'll follow-up with her in 1 month.

## 2016-01-16 ENCOUNTER — Telehealth: Payer: Self-pay | Admitting: *Deleted

## 2016-01-16 NOTE — Telephone Encounter (Addendum)
Pt states her Diclofenac gel need prior authorization. I started PA with [employer]. Informed pt the PA had been started. 01/17/2016-[employer] denied coverage of Diclofenac gel.  Dr. Milinda Pointer offers [place] Antiinflammatory cream as alternative.Faxed to [employer].

## 2016-01-17 MED ORDER — NONFORMULARY OR COMPOUNDED ITEM: 11 refills | Status: DC

## 2016-02-03 ENCOUNTER — Other Ambulatory Visit: Payer: Self-pay | Admitting: Internal Medicine

## 2016-02-03 ENCOUNTER — Other Ambulatory Visit: Payer: Self-pay | Admitting: Pain Medicine

## 2016-02-21 ENCOUNTER — Ambulatory Visit (INDEPENDENT_AMBULATORY_CARE_PROVIDER_SITE_OTHER): Payer: BLUE CROSS/BLUE SHIELD | Admitting: Podiatry

## 2016-02-21 ENCOUNTER — Encounter: Payer: Self-pay | Admitting: Podiatry

## 2016-02-21 DIAGNOSIS — M722 Plantar fascial fibromatosis: Secondary | ICD-10-CM | POA: Diagnosis not present

## 2016-02-21 NOTE — Progress Notes (Signed)
She presents today for follow-up of her right heel. She states that she feels that she is approximately 90-95% improved at this point. I still given a few twinges she states it is not well yet and she was to make sure that it is well before we discontinue our efforts.  Objective: Vital signs are stable alert and oriented 3 pulses remain palpable pain. Pain on palpation medial procedure removal of the right foot. No erythema cellulitis drainage or odor. No calf pain.  Assessment: Plantar fasciitis 95% resolved.  Plan: I reinjected her right heel today with [employer] local anesthetic and we'll follow-up with her in 1 month if necessary.

## 2016-03-03 ENCOUNTER — Other Ambulatory Visit (INDEPENDENT_AMBULATORY_CARE_PROVIDER_SITE_OTHER): Payer: BLUE CROSS/BLUE SHIELD

## 2016-03-03 DIAGNOSIS — E785 Hyperlipidemia, unspecified: Secondary | ICD-10-CM

## 2016-03-03 DIAGNOSIS — E119 Type 2 diabetes mellitus without complications: Secondary | ICD-10-CM

## 2016-03-03 LAB — COMPREHENSIVE METABOLIC PANEL
ALT: 16 U/L (ref 0–35)
AST: 16 U/L (ref 0–37)
Albumin: 4.3 g/dL (ref 3.5–5.2)
Alkaline Phosphatase: 67 U/L (ref 39–117)
BILIRUBIN TOTAL: 0.4 mg/dL (ref 0.2–1.2)
BUN: 17 mg/dL (ref 6–23)
CALCIUM: 9.4 mg/dL (ref 8.4–10.5)
CHLORIDE: 104 meq/L (ref 96–112)
CO2: 31 meq/L (ref 19–32)
CREATININE: 0.73 mg/dL (ref 0.40–1.20)
GFR: 85.97 mL/min (ref >60.00)
GLUCOSE: 132 mg/dL — AB (ref 70–99)
Potassium: 4.5 mEq/L (ref 3.5–5.1)
SODIUM: 142 meq/L (ref 135–145)
Total Protein: 7 g/dL (ref 6.0–8.3)

## 2016-03-03 LAB — LIPID PANEL
CHOL/HDL RATIO: 5
Cholesterol: 234 mg/dL — ABNORMAL HIGH (ref 0–200)
HDL: 45.4 mg/dL (ref >39.00)
NONHDL: 188.31
Triglycerides: 219 mg/dL — ABNORMAL HIGH (ref 0.0–149.0)
VLDL: 43.8 mg/dL — AB (ref 0.0–40.0)

## 2016-03-03 LAB — HEMOGLOBIN A1C: Hgb A1c MFr Bld: 6.7 % — ABNORMAL HIGH (ref 4.6–6.5)

## 2016-03-03 LAB — LDL CHOLESTEROL, DIRECT: LDL DIRECT: 166 mg/dL

## 2016-03-06 ENCOUNTER — Encounter: Payer: Self-pay | Admitting: Internal Medicine

## 2016-03-06 ENCOUNTER — Ambulatory Visit (INDEPENDENT_AMBULATORY_CARE_PROVIDER_SITE_OTHER): Payer: BLUE CROSS/BLUE SHIELD | Admitting: Internal Medicine

## 2016-03-06 VITALS — BP 154/90 | HR 74 | Temp 97.9°F | Resp 12 | Ht 63.0 in | Wt 177.2 lb

## 2016-03-06 DIAGNOSIS — R5383 Other fatigue: Secondary | ICD-10-CM | POA: Diagnosis not present

## 2016-03-06 DIAGNOSIS — E119 Type 2 diabetes mellitus without complications: Secondary | ICD-10-CM | POA: Diagnosis not present

## 2016-03-06 DIAGNOSIS — E782 Mixed hyperlipidemia: Secondary | ICD-10-CM

## 2016-03-06 DIAGNOSIS — Z23 Encounter for immunization: Secondary | ICD-10-CM

## 2016-03-06 DIAGNOSIS — E785 Hyperlipidemia, unspecified: Secondary | ICD-10-CM

## 2016-03-06 DIAGNOSIS — R030 Elevated blood-pressure reading, without diagnosis of hypertension: Secondary | ICD-10-CM

## 2016-03-06 DIAGNOSIS — G894 Chronic pain syndrome: Secondary | ICD-10-CM

## 2016-03-06 NOTE — Patient Instructions (Addendum)
Your blood pressure is elevated today,  It may be the meloxicam   Please Check BP at home .  If your readings for the next 4 days are  130/80,  Suspend  The meloxicam for 72 hours and recheck your BP  on Days and 3.     Your diabetes remains under excellent control  And your cholesterol Ahas improved.  Your oher labs are also normal. Please continue your current medications. return in 3 months for follow up on diabetes .  Pleas ask Dr Glendale Chard to send me a copy of your recent eye exam

## 2016-03-06 NOTE — Progress Notes (Signed)
Pre-visit discussion using our clinic review tool. No additional management support is needed unless otherwise documented below in the visit note.  

## 2016-03-06 NOTE — Progress Notes (Signed)
Subjective:  Patient ID: Julia Pierce, female    DOB: 02/15/1956  Age: 60 y.o. MRN: [id number]  CC: The primary encounter diagnosis was Mixed hypertriglyceridemia. Diagnoses of Diabetes mellitus type 2, diet-controlled ([place]), Fatigue, unspecified type, Encounter for immunization, Hyperlipidemia with target LDL less than 100, Chronic pain syndrome, and Elevated blood pressure reading without diagnosis of hypertension were also pertinent to this visit.  HPI Julia Pierce presents for follow up on type 2 DM and hypertension, hyperlipidemia.  Has weaned herself  off of vicodin and has been completel off for the past  week taking meloxicam instead  Since Sept 21st .  Home BP 136/80.  Swimming and massages have helped. She is 30 laps daily  Is  She Taking a compounded anti inflammatory for foot pain,  Told to wear daily Tennis shoes but they are irritating the cutaneous nerve in foot, and having arthritis .  Compound is definietly helping   diclofen 3%,  Baclofen 2% flexeril 2% and lidoacine 2%  In a lipderm cream base [facility]   Applying 10-2 g  3-4 times daily prn    Eye exam was done in may Dr Glendale Chard in [place]  Reportedly normal  BP has been elevated  Taking red yeast rice now twice daily ant tolerating it.    Overdue for Hep C,  HIV, diabetic eye exam and mammogram     Outpatient Medications Prior to Visit  Medication Sig Dispense Refill  . aspirin EC 81 MG tablet Take 81 mg by mouth daily.    . Azelaic Acid (FINACEA EX) Apply 1 application topically 2 (two) times daily.    . B Complex Vitamins (B COMPLEX PO) Take 1 tablet by mouth daily.     . calcium carbonate (OS-CAL) 600 MG TABS Take 600 mg by mouth daily.      . calcium elemental as carbonate (BARIATRIC TUMS ULTRA) 400 MG chewable tablet Chew by mouth.    . Cholecalciferol (VITAMIN D3) 2000 UNITS TABS Take 1 tablet by mouth daily.    . colchicine 0.6 MG tablet Take 0.6 mg by mouth 2 (two) times daily as needed  (Gout). Reported on 09/20/2015    . esomeprazole (NEXIUM) 20 MG capsule Take 40 mg by mouth daily at 12 noon.    . fluticasone (FLONASE) 50 MCG/ACT nasal spray Place 2 sprays into both nostrils daily.    . furosemide (LASIX) 20 MG tablet Take 1 tablet (20 mg total) by mouth daily. As needed for fluid retention 30 tablet 0  . losartan-hydrochlorothiazide (HYZAAR) 50-12.5 MG tablet Take 1 tablet by mouth daily. 90 tablet 1  . meloxicam (MOBIC) 15 MG tablet Take 1 tablet (15 mg total) by mouth daily. 30 tablet 3  . methocarbamol (ROBAXIN) 500 MG tablet Limit 1 tablet by mouth per day or 2-3 times per day if tolerated 90 tablet 0  . metoprolol tartrate (LOPRESSOR) 25 MG tablet Take 12.5 mg by mouth daily. Reported on 05/17/2015    . Multiple Vitamin (MULTIVITAMIN) tablet Take 1 tablet by mouth daily.      . NONFORMULARY OR COMPOUNDED ITEM [facility]:  Antiinflammatory Cream - Diclofenac 3%, Baclofen 2%, Lidocaine 2%, apply 1-2 grams 3-4 times a day. 480 each 11  . Red Yeast Rice 600 MG CAPS Take 1 capsule (600 mg total) by mouth 2 (two) times daily. 60 capsule 2  . sertraline (ZOLOFT) 100 MG tablet Take 1 tablet (100 mg total) by mouth daily. 90 tablet 3  .  sertraline (ZOLOFT) 100 MG tablet TAKE 1 TABLET BY MOUTH EVERY DAY 90 tablet 1  . spironolactone (ALDACTONE) 25 MG tablet Take 25 mg by mouth daily.    Marland Kitchen tolterodine (DETROL) 2 MG tablet TAKE 1 TABLET TWICE A DAY 180 tablet 4  . HYDROcodone-acetaminophen (NORCO/VICODIN) 5-325 MG tablet Limit 1-3 tabs by mouth daily if tolerated (Patient not taking: Reported on 03/06/2016) 80 tablet 0  . Omega-3 Fatty Acids (FISH OIL) 1000 MG CAPS Take 1 capsule by mouth 2 (two) times daily.     Facility-Administered Medications Prior to Visit  Medication Dose Route Frequency Provider Last Rate Last Dose  . diclofenac sodium (VOLTAREN) 1 % transdermal gel 4 g  4 g Topical QID Mohammed Kindle, MD      . midazolam (VERSED) 5 MG/5ML injection 5 mg  5 mg Intravenous  Once Mohammed Kindle, MD      . orphenadrine (NORFLEX) injection 60 mg  60 mg Intramuscular Once Mohammed Kindle, MD      . sodium chloride 0.9 % injection 20 mL  20 mL Other Once Mohammed Kindle, MD      . triamcinolone acetonide (KENALOG-40) injection 40 mg  40 mg Other Once Mohammed Kindle, MD      . bupivacaine (PF) (MARCAINE) 0.25 % injection 30 mL  30 mL Other Once Mohammed Kindle, MD      . ciprofloxacin (CIPRO) IVPB 400 mg  400 mg Intravenous Once Mohammed Kindle, MD      . fentaNYL (SUBLIMAZE) injection 100 mcg  100 mcg Intravenous Once Mohammed Kindle, MD      . lactated ringers infusion 1,000 mL  1,000 mL Intravenous Continuous Mohammed Kindle, MD      . lidocaine (PF) (XYLOCAINE) 1 % injection 10 mL  10 mL Subcutaneous Once Mohammed Kindle, MD        Review of Systems;  Patient denies headache, fevers, malaise, unintentional weight loss, skin rash, eye pain, sinus congestion and sinus pain, sore throat, dysphagia,  hemoptysis , cough, dyspnea, wheezing, chest pain, palpitations, orthopnea, edema, abdominal pain, nausea, melena, diarrhea, constipation, flank pain, dysuria, hematuria, urinary  Frequency, nocturia, numbness, tingling, seizures,  Focal weakness, Loss of consciousness,  Tremor, insomnia, depression, anxiety, and suicidal ideation.      Objective:  BP (!) 154/90   Pulse 74   Temp 97.9 F (36.6 C) (Oral)   Resp 12   Ht 5\' 3"  (1.6 m)   Wt 177 lb 4 oz (80.4 kg)   SpO2 98%   BMI 31.40 kg/m   BP Readings from Last 3 Encounters:  03/06/16 (!) 154/90  12/31/15 127/78  12/05/15 136/78    Wt Readings from Last 3 Encounters:  03/06/16 177 lb 4 oz (80.4 kg)  12/31/15 172 lb (78 kg)  12/05/15 172 lb 8 oz (78.2 kg)    General appearance: alert, cooperative and appears stated age Ears: normal TM's and external ear canals both ears Throat: lips, mucosa, and tongue normal; teeth and gums normal Neck: no adenopathy, no carotid bruit, supple, symmetrical, trachea midline and thyroid  not enlarged, symmetric, no tenderness/mass/nodules Back: symmetric, no curvature. ROM normal. No CVA tenderness. Lungs: clear to auscultation bilaterally Heart: regular rate and rhythm, S1, S2 normal, no murmur, click, rub or gallop Abdomen: soft, non-tender; bowel sounds normal; no masses,  no organomegaly Pulses: 2+ and symmetric Skin: Skin color, texture, turgor normal. No rashes or lesions Lymph nodes: Cervical, supraclavicular, and axillary nodes normal.  Lab Results  Component Value Date  HGBA1C 6.7 (H) 03/03/2016   HGBA1C 7.1 (H) 12/03/2015   HGBA1C 7.0 (H) 08/01/2015    Lab Results  Component Value Date   CREATININE 0.73 03/03/2016   CREATININE 0.80 12/03/2015   CREATININE 0.73 08/01/2015    Lab Results  Component Value Date   WBC 9.6 06/01/2013   HGB 12.9 06/01/2013   HCT 37.4 06/01/2013   PLT 204 06/01/2013   GLUCOSE 132 (H) 03/03/2016   CHOL 234 (H) 03/03/2016   TRIG 219.0 (H) 03/03/2016   HDL 45.40 03/03/2016   LDLDIRECT 166.0 03/03/2016   LDLCALC 88 08/17/2013   ALT 16 03/03/2016   AST 16 03/03/2016   NA 142 03/03/2016   K 4.5 03/03/2016   CL 104 03/03/2016   CREATININE 0.73 03/03/2016   BUN 17 03/03/2016   CO2 31 03/03/2016   TSH 3.11 05/09/2013   HGBA1C 6.7 (H) 03/03/2016   MICROALBUR <0.7 08/01/2015    No results found.  Assessment & Plan:   Problem List Items Addressed This Visit    Hyperlipidemia with target LDL less than 100    LDL is at goal, and  her triglycerides are improving   She has intolerance to fenofibrate. .  Low GI diet and trial of Red Yeast Rice recommended  Lab Results  Component Value Date   CHOL 234 (H) 03/03/2016   HDL 45.40 03/03/2016   LDLCALC 88 08/17/2013   LDLDIRECT 166.0 03/03/2016   TRIG 219.0 (H) 03/03/2016   CHOLHDL 5 03/03/2016   Lab Results  Component Value Date   ALT 16 03/03/2016   AST 16 03/03/2016   ALKPHOS 67 03/03/2016   BILITOT 0.4 03/03/2016           Diabetes mellitus type 2,  diet-controlled ([place])    Well-controlled on diet  I have addressed  BMI and recommended wt loss of 10% of body weight over the next 6 months using a low glycemic index diet and resume her regular exercise a minimum of 5 days per week.  her foot exam is normal today.   Lab Results  Component Value Date   HGBA1C 6.7 (H) 03/03/2016   Lab Results  Component Value Date   MICROALBUR <0.7 08/01/2015           Relevant Orders   Comprehensive metabolic panel   Hemoglobin A1c   Microalbumin / creatinine urine ratio   Chronic pain syndrome    Now Managed without daily use of vicodin . Patient using meloxicam and topical nsaids compounded by local pharmacy.      Elevated blood pressure reading without diagnosis of hypertension    She has no history of hypertension but has  elevated readings.  She has been asked to check her pressures at home and submit readings for evaluation. Renal function wiis normal.  May be due to meloxicam.   Lab Results  Component Value Date   CREATININE 0.73 03/03/2016   Lab Results  Component Value Date   NA 142 03/03/2016   K 4.5 03/03/2016   CL 104 03/03/2016   CO2 31 03/03/2016         Mixed hypertriglyceridemia - Primary   Relevant Orders   LDL cholesterol, direct   Lipid panel    Other Visit Diagnoses    Fatigue, unspecified type       Relevant Orders   Hepatitis C antibody   HIV antibody   Encounter for immunization       Relevant Orders   Flu Vaccine QUAD  36+ mos IM (Completed)      I have discontinued Ms. Sampey's Fish Oil. I am also having her maintain her calcium carbonate, multivitamin, spironolactone, sertraline, B Complex Vitamins (B COMPLEX PO), Azelaic Acid (FINACEA EX), Vitamin D3, aspirin EC, esomeprazole, fluticasone, metoprolol tartrate, colchicine, furosemide, tolterodine, losartan-hydrochlorothiazide, calcium elemental as carbonate, meloxicam, Red Yeast Rice, HYDROcodone-acetaminophen, methocarbamol, NONFORMULARY OR  COMPOUNDED ITEM, and sertraline. We will stop administering bupivacaine (PF), ciprofloxacin, fentaNYL, lactated ringers, and lidocaine (PF). Additionally, we will continue to administer midazolam, orphenadrine, sodium chloride, triamcinolone acetonide, and diclofenac sodium.  No orders of the defined types were placed in this encounter.   Medications Discontinued During This Encounter  Medication Reason  . fentaNYL (SUBLIMAZE) injection 100 mcg Completed Course  . Omega-3 Fatty Acids (FISH OIL) 1000 MG CAPS Change in therapy  . ciprofloxacin (CIPRO) IVPB 400 mg Completed Course  . lactated ringers infusion 1,000 mL Completed Course  . lidocaine (PF) (XYLOCAINE) 1 % injection 10 mL Change in therapy  . bupivacaine (PF) (MARCAINE) 0.25 % injection 30 mL Completed Course    Follow-up: Return in about 3 months (around 06/06/2016), or labs and OV on or after  Feb 16 .   Crecencio Mc, MD

## 2016-03-08 DIAGNOSIS — R030 Elevated blood-pressure reading, without diagnosis of hypertension: Secondary | ICD-10-CM | POA: Insufficient documentation

## 2016-03-08 NOTE — Assessment & Plan Note (Addendum)
Now Managed without daily use of vicodin . Patient using meloxicam and topical nsaids compounded by local pharmacy.

## 2016-03-08 NOTE — Assessment & Plan Note (Signed)
She has no history of hypertension but has  elevated readings.  She has been asked to check her pressures at home and submit readings for evaluation. Renal function wiis normal.  May be due to meloxicam.   Lab Results  Component Value Date   CREATININE 0.73 03/03/2016   Lab Results  Component Value Date   NA 142 03/03/2016   K 4.5 03/03/2016   CL 104 03/03/2016   CO2 31 03/03/2016

## 2016-03-08 NOTE — Assessment & Plan Note (Signed)
LDL is at goal, and  her triglycerides are improving   She has intolerance to fenofibrate. .  Low GI diet and trial of Red Yeast Rice recommended  Lab Results  Component Value Date   CHOL 234 (H) 03/03/2016   HDL 45.40 03/03/2016   LDLCALC 88 08/17/2013   LDLDIRECT 166.0 03/03/2016   TRIG 219.0 (H) 03/03/2016   CHOLHDL 5 03/03/2016   Lab Results  Component Value Date   ALT 16 03/03/2016   AST 16 03/03/2016   ALKPHOS 67 03/03/2016   BILITOT 0.4 03/03/2016

## 2016-03-08 NOTE — Assessment & Plan Note (Signed)
Well-controlled on diet  I have addressed  BMI and recommended wt loss of 10% of body weight over the next 6 months using a low glycemic index diet and resume her regular exercise a minimum of 5 days per week.  her foot exam is normal today.   Lab Results  Component Value Date   HGBA1C 6.7 (H) 03/03/2016   Lab Results  Component Value Date   MICROALBUR <0.7 08/01/2015

## 2016-03-11 ENCOUNTER — Encounter: Payer: Self-pay | Admitting: Internal Medicine

## 2016-03-17 ENCOUNTER — Encounter: Payer: Self-pay | Admitting: Internal Medicine

## 2016-03-24 ENCOUNTER — Ambulatory Visit (INDEPENDENT_AMBULATORY_CARE_PROVIDER_SITE_OTHER): Payer: BLUE CROSS/BLUE SHIELD | Admitting: Internal Medicine

## 2016-03-24 ENCOUNTER — Encounter: Payer: Self-pay | Admitting: Internal Medicine

## 2016-03-24 VITALS — BP 132/80 | HR 80 | Ht 62.0 in | Wt 177.4 lb

## 2016-03-24 DIAGNOSIS — I5022 Chronic systolic (congestive) heart failure: Secondary | ICD-10-CM

## 2016-03-24 DIAGNOSIS — Z950 Presence of cardiac pacemaker: Secondary | ICD-10-CM

## 2016-03-24 NOTE — Progress Notes (Signed)
HPI [name] returns today for followup. She is a very pleasant 60 yo woman with chronic LBBB, chronic systolic heart failure, and syncope, s/p insertion of a BiV PPM. She has a nonischemic cardiomyopathy. In the interim she has done well. No recurrent syncope. No chest pain or sob or peripheral edema. She remains active swimming. Allergies  Allergen Reactions  . Cephalexin Anaphylaxis  . Penicillin G Hives, Shortness Of Breath and Swelling  . Penicillins Hives, Shortness Of Breath and Swelling  . Atorvastatin     Other reaction(s): Muscle Pain myalgias myalgias  . Cymbalta [Duloxetine Hcl] Other (See Comments)  . Duloxetine     Other reaction(s): Muscle Pain Other reaction(s): Muscle Pain  . Fenofibrate     Muscle pain   . Statins     Severe myalgias  . Latex Rash     Current Outpatient Prescriptions  Medication Sig Dispense Refill  . aspirin EC 81 MG tablet Take 81 mg by mouth daily.    . Azelaic Acid (FINACEA EX) Apply 1 application topically 2 (two) times daily.    . B Complex Vitamins (B COMPLEX PO) Take 1 tablet by mouth daily.     . calcium elemental as carbonate (BARIATRIC TUMS ULTRA) 400 MG chewable tablet Chew by mouth.    . Cholecalciferol (VITAMIN D3) 2000 UNITS TABS Take 1 tablet by mouth daily.    Marland Kitchen esomeprazole (NEXIUM) 20 MG capsule Take 40 mg by mouth daily at 12 noon.    . fluticasone (FLONASE) 50 MCG/ACT nasal spray Place 2 sprays into both nostrils daily.    . furosemide (LASIX) 20 MG tablet Take 1 tablet (20 mg total) by mouth daily. As needed for fluid retention 30 tablet 0  . losartan-hydrochlorothiazide (HYZAAR) 50-12.5 MG tablet Take 1 tablet by mouth daily. 90 tablet 1  . methocarbamol (ROBAXIN) 500 MG tablet Limit 1 tablet by mouth per day or 2-3 times per day if tolerated 90 tablet 0  . metoprolol tartrate (LOPRESSOR) 25 MG tablet Take 12.5 mg by mouth daily. Reported on 05/17/2015    . Multiple Vitamin (MULTIVITAMIN) tablet Take 1 tablet by  mouth daily.      . NONFORMULARY OR COMPOUNDED ITEM [facility]:  Antiinflammatory Cream - Diclofenac 3%, Baclofen 2%, Lidocaine 2%, apply 1-2 grams 3-4 times a day. 480 each 11  . Red Yeast Rice 600 MG CAPS Take 1 capsule (600 mg total) by mouth 2 (two) times daily. 60 capsule 2  . sertraline (ZOLOFT) 100 MG tablet Take 1 tablet (100 mg total) by mouth daily. 90 tablet 3  . spironolactone (ALDACTONE) 25 MG tablet Take 25 mg by mouth daily.    Marland Kitchen tolterodine (DETROL) 2 MG tablet TAKE 1 TABLET TWICE A DAY 180 tablet 4   Current Facility-Administered Medications  Medication Dose Route Frequency Provider Last Rate Last Dose  . diclofenac sodium (VOLTAREN) 1 % transdermal gel 4 g  4 g Topical QID Mohammed Kindle, MD      . midazolam (VERSED) 5 MG/5ML injection 5 mg  5 mg Intravenous Once Mohammed Kindle, MD      . orphenadrine (NORFLEX) injection 60 mg  60 mg Intramuscular Once Mohammed Kindle, MD      . sodium chloride 0.9 % injection 20 mL  20 mL Other Once Mohammed Kindle, MD      . triamcinolone acetonide (KENALOG-40) injection 40 mg  40 mg Other Once Mohammed Kindle, MD  Past Medical History:  Diagnosis Date  . Arthritis   . Cardiac arrhythmia due to congenital heart disease   . Chicken pox   . Depression   . Headache, frequent episodic tension-type   . High cholesterol   . History of high blood pressure    readings  . Holter monitor, abnormal August 2012   done for long QT.  some contractile asynchrony [name])  . Hx of colonoscopy sept 2012   normal,  next due 2022, [name]  . Hypertension   . Migraines   . Morton's neuroma    bilateral  . Syncope     ROS:   All systems reviewed and negative except as noted in the HPI.   Past Surgical History:  Procedure Laterality Date  . ABDOMINAL HYSTERECTOMY  Dec 2013   [place]  . APPENDECTOMY  1973  . BI-VENTRICULAR PACEMAKER INSERTION (CRT-P)  05/2013   MDT CRTP implanted by Dr Lovena Le for cardiomyopathy, LBBB, and syncope    . HYSTEROSCOPY  2005   heavy bleeding  . IMPLANTABLE CARDIOVERTER DEFIBRILLATOR IMPLANT N/A 06/02/2013   Procedure: IMPLANTABLE CARDIOVERTER DEFIBRILLATOR IMPLANT;  Surgeon: Evans Lance, MD;  Location: [facility] CATH LAB;  Service: Cardiovascular;  Laterality: N/A;  . SPINAL FUSION  03/2007   ruptured disc  L5  [name]  . TONSILLECTOMY AND ADENOIDECTOMY  1966     Family History  Problem Relation Age of Onset  . Arthritis Mother   . Hyperlipidemia Mother   . Cirrhosis Mother   . Arthritis Father   . Cancer Father     prostate cancer   . Hyperlipidemia Father   . Pulmonary embolism Father 81  . Alzheimer's disease Father   . Cancer Other     ovarian,uterus  . Heart disease Other   . Stroke Other   . Learning disabilities Other   . Coronary artery disease Paternal Grandfather 91     Social History   Social History  . Marital status: Married    Spouse name: N/A  . Number of children: N/A  . Years of education: N/A   Occupational History  . Not on file.   Social History Main Topics  . Smoking status: Never Smoker  . Smokeless tobacco: Never Used  . Alcohol use No  . Drug use: No  . Sexual activity: Not on file   Other Topics Concern  . Not on file   Social History Narrative  . No narrative on file     BP 132/80   Pulse 80   Ht 5\' 2"  (1.575 m)   Wt 177 lb 6.4 oz (80.5 kg)   SpO2 97%   BMI 32.45 kg/m   Physical Exam:  Well appearing obese, midde aged woman, NAD HEENT: Unremarkable Neck:  7 cm JVD, no thyromegally Back:  No CVA tenderness Lungs:  Clear with no wheezes, well healed PPM incision. HEART:  Regular rate rhythm, no murmurs, no rubs, no clicks Abd:  soft, positive bowel sounds, no organomegally, no rebound, no guarding Ext:  2 plus pulses, no edema, no cyanosis, no clubbing Skin:  No rashes no nodules Neuro:  CN II through XII intact, motor grossly intact  ECG - sinus rhythm with biventricular pacing  DEVICE  Normal device function.   See PaceArt for details.   Assess/Plan: 1. Chronic systolic heart failure - she remains class 2. No change in meds. 2. BiV PPM - her medtronic device is working normally. Will recheck in several months. 3. Dyslipidemia -  intolerant to statins. She will continue the red yeast rice 4. Obesity - she is encouraged to lose weight.  Mikle Bosworth.D.

## 2016-03-24 NOTE — Patient Instructions (Addendum)
Medication Instructions:  Your physician recommends that you continue on your current medications as directed. Please refer to the Current Medication list given to you today.   Labwork: None Ordered   Testing/Procedures: None Ordered   Follow-Up: Your physician wants you to follow-up in: 1 year with [name]. You will receive a reminder letter in the mail two months in advance. If you don't receive a letter, please call our office to schedule the follow-up appointment.  Remote monitoring is used to monitor your Pacemaker from home. This monitoring reduces the number of office visits required to check your device to one time per year. It allows [place] to keep an eye on the functioning of your device to ensure it is working properly. You are scheduled for a device check from home on 2/29/18. You may send your transmission at any time that day. If you have a wireless device, the transmission will be sent automatically. After your physician reviews your transmission, you will receive a postcard with your next transmission date.     Any Other Special Instructions Will Be Listed Below (If Applicable).     If you need a refill on your cardiac medications before your next appointment, please call your pharmacy.

## 2016-04-02 DIAGNOSIS — I420 Dilated cardiomyopathy: Secondary | ICD-10-CM | POA: Diagnosis not present

## 2016-04-02 DIAGNOSIS — Z950 Presence of cardiac pacemaker: Secondary | ICD-10-CM | POA: Diagnosis not present

## 2016-04-02 DIAGNOSIS — I10 Essential (primary) hypertension: Secondary | ICD-10-CM | POA: Diagnosis not present

## 2016-04-03 ENCOUNTER — Ambulatory Visit: Payer: BLUE CROSS/BLUE SHIELD | Admitting: Podiatry

## 2016-05-10 ENCOUNTER — Other Ambulatory Visit: Payer: Self-pay | Admitting: Internal Medicine

## 2016-06-09 ENCOUNTER — Other Ambulatory Visit (INDEPENDENT_AMBULATORY_CARE_PROVIDER_SITE_OTHER): Payer: BLUE CROSS/BLUE SHIELD

## 2016-06-09 DIAGNOSIS — E119 Type 2 diabetes mellitus without complications: Secondary | ICD-10-CM | POA: Diagnosis not present

## 2016-06-09 DIAGNOSIS — E782 Mixed hyperlipidemia: Secondary | ICD-10-CM | POA: Diagnosis not present

## 2016-06-09 DIAGNOSIS — R5383 Other fatigue: Secondary | ICD-10-CM

## 2016-06-09 LAB — HEMOGLOBIN A1C: HEMOGLOBIN A1C: 7.3 % — AB (ref 4.6–6.5)

## 2016-06-09 LAB — COMPREHENSIVE METABOLIC PANEL
ALBUMIN: 4.3 g/dL (ref 3.5–5.2)
ALT: 22 U/L (ref 0–35)
AST: 24 U/L (ref 0–37)
Alkaline Phosphatase: 71 U/L (ref 39–117)
BUN: 12 mg/dL (ref 6–23)
CALCIUM: 9.3 mg/dL (ref 8.4–10.5)
CHLORIDE: 104 meq/L (ref 96–112)
CO2: 29 meq/L (ref 19–32)
Creatinine, Ser: 0.61 mg/dL (ref 0.40–1.20)
GFR: 105.67 mL/min (ref >60.00)
Glucose, Bld: 169 mg/dL — ABNORMAL HIGH (ref 70–99)
POTASSIUM: 3.9 meq/L (ref 3.5–5.1)
Sodium: 142 mEq/L (ref 135–145)
Total Bilirubin: 0.4 mg/dL (ref 0.2–1.2)
Total Protein: 6.6 g/dL (ref 6.0–8.3)

## 2016-06-09 LAB — LIPID PANEL
CHOL/HDL RATIO: 6
Cholesterol: 240 mg/dL — ABNORMAL HIGH (ref 0–200)
HDL: 40.8 mg/dL (ref >39.00)
NONHDL: 199.35
TRIGLYCERIDES: 370 mg/dL — AB (ref 0.0–149.0)
VLDL: 74 mg/dL — ABNORMAL HIGH (ref 0.0–40.0)

## 2016-06-09 LAB — MICROALBUMIN / CREATININE URINE RATIO
Creatinine,U: 124.3 mg/dL
Microalb Creat Ratio: 0.6 mg/g (ref 0.0–30.0)
Microalb, Ur: 0.7 mg/dL (ref 0.0–1.9)

## 2016-06-09 LAB — LDL CHOLESTEROL, DIRECT: Direct LDL: 144 mg/dL

## 2016-06-10 ENCOUNTER — Encounter: Payer: Self-pay | Admitting: Internal Medicine

## 2016-06-10 LAB — HIV ANTIBODY (ROUTINE TESTING W REFLEX): HIV 1&2 Ab, 4th Generation: NONREACTIVE

## 2016-06-10 LAB — HEPATITIS C ANTIBODY: HCV Ab: NEGATIVE

## 2016-06-11 ENCOUNTER — Encounter: Payer: Self-pay | Admitting: Internal Medicine

## 2016-06-11 ENCOUNTER — Ambulatory Visit (INDEPENDENT_AMBULATORY_CARE_PROVIDER_SITE_OTHER): Payer: BLUE CROSS/BLUE SHIELD | Admitting: Internal Medicine

## 2016-06-11 VITALS — BP 130/80 | HR 84 | Resp 16 | Wt 177.0 lb

## 2016-06-11 DIAGNOSIS — E669 Obesity, unspecified: Secondary | ICD-10-CM | POA: Diagnosis not present

## 2016-06-11 DIAGNOSIS — I10 Essential (primary) hypertension: Secondary | ICD-10-CM

## 2016-06-11 DIAGNOSIS — Z90710 Acquired absence of both cervix and uterus: Secondary | ICD-10-CM

## 2016-06-11 DIAGNOSIS — E119 Type 2 diabetes mellitus without complications: Secondary | ICD-10-CM

## 2016-06-11 DIAGNOSIS — N8110 Cystocele, unspecified: Secondary | ICD-10-CM | POA: Diagnosis not present

## 2016-06-11 DIAGNOSIS — Z1239 Encounter for other screening for malignant neoplasm of breast: Secondary | ICD-10-CM

## 2016-06-11 DIAGNOSIS — Z1231 Encounter for screening mammogram for malignant neoplasm of breast: Secondary | ICD-10-CM | POA: Diagnosis not present

## 2016-06-11 DIAGNOSIS — E785 Hyperlipidemia, unspecified: Secondary | ICD-10-CM | POA: Diagnosis not present

## 2016-06-11 DIAGNOSIS — E782 Mixed hyperlipidemia: Secondary | ICD-10-CM | POA: Diagnosis not present

## 2016-06-11 MED ORDER — METHOCARBAMOL 500 MG PO TABS: ORAL_TABLET | ORAL | 0 refills | Status: DC

## 2016-06-11 NOTE — Progress Notes (Signed)
Pre visit review using our clinic review tool, if applicable. No additional management support is needed unless otherwise documented below in the visit note. 

## 2016-06-11 NOTE — Patient Instructions (Addendum)
Don't let your stress cause you to over indulge.  Your cholesterol suggests this has happened.  I'll see you again in 3 months .  Please have your fasting  labs done prior to your next visit    I have ordered your mammogram and gyn referral to Dr Garwin Brothers

## 2016-06-11 NOTE — Progress Notes (Signed)
Subjective:  Patient ID: Julia Pierce, female    DOB: 1955-07-30  Age: 61 y.o. MRN: [id number]  CC: The primary encounter diagnosis was Hyperlipidemia with target LDL less than 100. Diagnoses of Diabetes mellitus type 2, diet-controlled ([place]), Breast cancer screening, Bladder prolapse, female, acquired, S/P total abdominal hysterectomy, Mixed hypertriglyceridemia, Obesity (BMI 30.0-34.9), and Essential hypertension were also pertinent to this visit.  HPI Julia Pierce presents for diabetes follow up  Husband  had norovirus for 3 days, ,  She was fastidious about cleaning up and did not have any illness  Labs reviewed.  cholesterol up  a1c slightly up .  Stressed out by daughter in law's lax attitude toward the disciplining of her children which results in conflict when patient is asked to provide daycare in her own home for the children.       Patient is following a low glycemic index diet and taking all prescribed medications regularly without side effects.  Fasting sugars have been under less than 140 most of the time and post prandials have been under 160 except on rare occasions. Patient is exercising about 3 times per week and intentionally trying to lose weight .  Patient has had an eye exam in the last 12 months and checks feet regularly for signs of infection.  Patient does not walk barefoot outside,  And denies an numbness tingling or burning in feet. Patient is up to date on all recommended vaccinations  Overdue for mammogram  Needs gyn referral for vaginal prolapse.  Describes having an unsatisfactory post op visit with Dr Cristino Martes following her hysterectomy, was told that her "insides were a mess."   Has been having symptoms of bladder prolapse ever since she removed a retained suture during the post op visit .  Chronic back pain.  Has stopped all opioids,  Not seeing [facility] anymore.  Being more  Tentative with her activities to avoid aggravating back    Lab Results  Component Value  Date   HGBA1C 7.3 (H) 06/09/2016     Outpatient Medications Prior to Visit  Medication Sig Dispense Refill  . aspirin EC 81 MG tablet Take 81 mg by mouth daily.    . Azelaic Acid (FINACEA EX) Apply 1 application topically 2 (two) times daily.    . B Complex Vitamins (B COMPLEX PO) Take 1 tablet by mouth daily.     . calcium elemental as carbonate (BARIATRIC TUMS ULTRA) 400 MG chewable tablet Chew by mouth.    . Cholecalciferol (VITAMIN D3) 2000 UNITS TABS Take 1 tablet by mouth daily.    Marland Kitchen esomeprazole (NEXIUM) 20 MG capsule Take 40 mg by mouth daily at 12 noon.    . fluticasone (FLONASE) 50 MCG/ACT nasal spray Place 2 sprays into both nostrils daily.    . furosemide (LASIX) 20 MG tablet Take 1 tablet (20 mg total) by mouth daily. As needed for fluid retention 30 tablet 0  . losartan-hydrochlorothiazide (HYZAAR) 50-12.5 MG tablet TAKE 1 TABLET BY MOUTH EVERY DAY 90 tablet 1  . metoprolol tartrate (LOPRESSOR) 25 MG tablet Take 12.5 mg by mouth daily. Reported on 05/17/2015    . Multiple Vitamin (MULTIVITAMIN) tablet Take 1 tablet by mouth daily.      . NONFORMULARY OR COMPOUNDED ITEM [facility]:  Antiinflammatory Cream - Diclofenac 3%, Baclofen 2%, Lidocaine 2%, apply 1-2 grams 3-4 times a day. 480 each 11  . Red Yeast Rice 600 MG CAPS Take 1 capsule (600 mg total) by mouth 2 (two) times  daily. 60 capsule 2  . sertraline (ZOLOFT) 100 MG tablet Take 1 tablet (100 mg total) by mouth daily. 90 tablet 3  . spironolactone (ALDACTONE) 25 MG tablet Take 25 mg by mouth daily.    Marland Kitchen tolterodine (DETROL) 2 MG tablet TAKE 1 TABLET TWICE A DAY 180 tablet 4  . methocarbamol (ROBAXIN) 500 MG tablet Limit 1 tablet by mouth per day or 2-3 times per day if tolerated 90 tablet 0   Facility-Administered Medications Prior to Visit  Medication Dose Route Frequency Provider Last Rate Last Dose  . diclofenac sodium (VOLTAREN) 1 % transdermal gel 4 g  4 g Topical QID Mohammed Kindle, MD      . midazolam  (VERSED) 5 MG/5ML injection 5 mg  5 mg Intravenous Once Mohammed Kindle, MD      . orphenadrine (NORFLEX) injection 60 mg  60 mg Intramuscular Once Mohammed Kindle, MD      . sodium chloride 0.9 % injection 20 mL  20 mL Other Once Mohammed Kindle, MD      . triamcinolone acetonide (KENALOG-40) injection 40 mg  40 mg Other Once Mohammed Kindle, MD        Review of Systems;  Patient denies headache, fevers, malaise, unintentional weight loss, skin rash, eye pain, sinus congestion and sinus pain, sore throat, dysphagia,  hemoptysis , cough, dyspnea, wheezing, chest pain, palpitations, orthopnea, edema, abdominal pain, nausea, melena, diarrhea, constipation, flank pain, dysuria, hematuria, urinary  Frequency, nocturia, numbness, tingling, seizures,  Focal weakness, Loss of consciousness,  Tremor, insomnia, depression, anxiety, and suicidal ideation.      Objective:  BP 130/80   Pulse 84   Resp 16   Wt 177 lb (80.3 kg)   SpO2 97%   BMI 32.37 kg/m   BP Readings from Last 3 Encounters:  06/11/16 130/80  03/24/16 132/80  03/06/16 (!) 154/90    Wt Readings from Last 3 Encounters:  06/11/16 177 lb (80.3 kg)  03/24/16 177 lb 6.4 oz (80.5 kg)  03/06/16 177 lb 4 oz (80.4 kg)    General appearance: alert, cooperative and appears stated age Ears: normal TM's and external ear canals both ears Throat: lips, mucosa, and tongue normal; teeth and gums normal Neck: no adenopathy, no carotid bruit, supple, symmetrical, trachea midline and thyroid not enlarged, symmetric, no tenderness/mass/nodules Back: symmetric, no curvature. ROM normal. No CVA tenderness. Lungs: clear to auscultation bilaterally Heart: regular rate and rhythm, S1, S2 normal, no murmur, click, rub or gallop Abdomen: soft, non-tender; bowel sounds normal; no masses,  no organomegaly Pulses: 2+ and symmetric Skin: Skin color, texture, turgor normal. No rashes or lesions Lymph nodes: Cervical, supraclavicular, and axillary nodes  normal.  Lab Results  Component Value Date   HGBA1C 7.3 (H) 06/09/2016   HGBA1C 6.7 (H) 03/03/2016   HGBA1C 7.1 (H) 12/03/2015    Lab Results  Component Value Date   CREATININE 0.61 06/09/2016   CREATININE 0.73 03/03/2016   CREATININE 0.80 12/03/2015    Lab Results  Component Value Date   WBC 9.6 06/01/2013   HGB 12.9 06/01/2013   HCT 37.4 06/01/2013   PLT 204 06/01/2013   GLUCOSE 169 (H) 06/09/2016   CHOL 240 (H) 06/09/2016   TRIG 370.0 (H) 06/09/2016   HDL 40.80 06/09/2016   LDLDIRECT 144.0 06/09/2016   LDLCALC 88 08/17/2013   ALT 22 06/09/2016   AST 24 06/09/2016   NA 142 06/09/2016   K 3.9 06/09/2016   CL 104 06/09/2016   CREATININE 0.61  06/09/2016   BUN 12 06/09/2016   CO2 29 06/09/2016   TSH 3.11 05/09/2013   HGBA1C 7.3 (H) 06/09/2016   MICROALBUR 0.7 06/09/2016    No results found.  Assessment & Plan:   Problem List Items Addressed This Visit    Bladder prolapse, female, acquired    Referral to dr cousins for assessment and treatment . Symptoms started after her abdominal  hysterectomy in Dec 2013      Relevant Orders   Ambulatory referral to Gynecology   Diabetes mellitus type 2, diet-controlled ([place])    Slight loss of control on diet  I have addressed  BMI and recommended wt loss of 10% of body weight over the next 6 months using a low glycemic index diet and resume her regular exercise a minimum of 5 days per week.  her foot exam is normal today.   Lab Results  Component Value Date   HGBA1C 7.3 (H) 06/09/2016   Lab Results  Component Value Date   MICROALBUR 0.7 06/09/2016           Relevant Orders   Comprehensive metabolic panel   Lipid panel   Hemoglobin A1c   Hyperlipidemia with target LDL less than 100 - Primary   Relevant Orders   LDL cholesterol, direct   Hypertension    Well controlled on current regimen. Renal function stable, no changes today.  Lab Results  Component Value Date   CREATININE 0.61 06/09/2016   Lab  Results  Component Value Date   NA 142 06/09/2016   K 3.9 06/09/2016   CL 104 06/09/2016   CO2 29 06/09/2016         Mixed hypertriglyceridemia    LDL is at goal, but her triglycerides have increased   She has intolerance to fenofibrate and statins. Resume exercise, .  Low GI diet and continue  Red Yeast Rice Lab Results  Component Value Date   CHOL 240 (H) 06/09/2016   HDL 40.80 06/09/2016   LDLCALC 88 08/17/2013   LDLDIRECT 144.0 06/09/2016   TRIG 370.0 (H) 06/09/2016   CHOLHDL 6 06/09/2016   Lab Results  Component Value Date   ALT 22 06/09/2016   AST 24 06/09/2016   ALKPHOS 71 06/09/2016   BILITOT 0.4 06/09/2016           Obesity (BMI 30.0-34.9)    Body mass index is 32.37 kg/m. I have addressed  BMI and recommended a low glycemic index diet utilizing smaller more frequent meals to increase metabolism.  I have also recommended that patient start exercising with a goal of 30 minutes of aerobic exercise a minimum of 5 days per week.         S/P total abdominal hysterectomy    Other Visit Diagnoses    Breast cancer screening       Relevant Orders   MM DIGITAL SCREENING BILATERAL      I am having Ms. Julien Girt maintain her multivitamin, spironolactone, sertraline, B Complex Vitamins (B COMPLEX PO), Azelaic Acid (FINACEA EX), Vitamin D3, aspirin EC, esomeprazole, fluticasone, metoprolol tartrate, furosemide, tolterodine, calcium elemental as carbonate, Red Yeast Rice, NONFORMULARY OR COMPOUNDED ITEM, losartan-hydrochlorothiazide, and methocarbamol. We will continue to administer midazolam, orphenadrine, sodium chloride, triamcinolone acetonide, and diclofenac sodium.  Meds ordered this encounter  Medications  . methocarbamol (ROBAXIN) 500 MG tablet    Sig: Limit 1 tablet by mouth per day or 2-3 times per day if tolerated    Dispense:  90 tablet    Refill:  0    Medications Discontinued During This Encounter  Medication Reason  . methocarbamol (ROBAXIN) 500 MG  tablet Reorder    Follow-up: Return in about 3 months (around 09/08/2016), or fastin glabs prior:  90+ days from today!!.   Nancye Grumbine, Aris Everts, MD

## 2016-06-12 DIAGNOSIS — Z90710 Acquired absence of both cervix and uterus: Secondary | ICD-10-CM | POA: Insufficient documentation

## 2016-06-12 DIAGNOSIS — N8110 Cystocele, unspecified: Secondary | ICD-10-CM | POA: Insufficient documentation

## 2016-06-12 NOTE — Assessment & Plan Note (Signed)
Slight loss of control on diet  I have addressed  BMI and recommended wt loss of 10% of body weight over the next 6 months using a low glycemic index diet and resume her regular exercise a minimum of 5 days per week.  her foot exam is normal today.   Lab Results  Component Value Date   HGBA1C 7.3 (H) 06/09/2016   Lab Results  Component Value Date   MICROALBUR 0.7 06/09/2016

## 2016-06-12 NOTE — Assessment & Plan Note (Signed)
Well controlled on current regimen. Renal function stable, no changes today.  Lab Results  Component Value Date   CREATININE 0.61 06/09/2016   Lab Results  Component Value Date   NA 142 06/09/2016   K 3.9 06/09/2016   CL 104 06/09/2016   CO2 29 06/09/2016

## 2016-06-12 NOTE — Assessment & Plan Note (Signed)
Referral to dr cousins for assessment and treatment . Symptoms started after her abdominal  hysterectomy in Dec 2013

## 2016-06-12 NOTE — Assessment & Plan Note (Signed)
Body mass index is 32.37 kg/m. I have addressed  BMI and recommended a low glycemic index diet utilizing smaller more frequent meals to increase metabolism.  I have also recommended that patient start exercising with a goal of 30 minutes of aerobic exercise a minimum of 5 days per week.

## 2016-06-12 NOTE — Assessment & Plan Note (Signed)
LDL is at goal, but her triglycerides have increased   She has intolerance to fenofibrate and statins. Resume exercise, .  Low GI diet and continue  Red Yeast Rice Lab Results  Component Value Date   CHOL 240 (H) 06/09/2016   HDL 40.80 06/09/2016   LDLCALC 88 08/17/2013   LDLDIRECT 144.0 06/09/2016   TRIG 370.0 (H) 06/09/2016   CHOLHDL 6 06/09/2016   Lab Results  Component Value Date   ALT 22 06/09/2016   AST 24 06/09/2016   ALKPHOS 71 06/09/2016   BILITOT 0.4 06/09/2016

## 2016-06-19 ENCOUNTER — Ambulatory Visit (INDEPENDENT_AMBULATORY_CARE_PROVIDER_SITE_OTHER): Payer: BLUE CROSS/BLUE SHIELD | Admitting: *Deleted

## 2016-06-19 DIAGNOSIS — I428 Other cardiomyopathies: Secondary | ICD-10-CM | POA: Diagnosis not present

## 2016-06-19 NOTE — Progress Notes (Signed)
Remote pacemaker transmission.   

## 2016-06-24 ENCOUNTER — Encounter: Payer: Self-pay | Admitting: Cardiology

## 2016-06-24 LAB — CUP PACEART REMOTE DEVICE CHECK
Brady Statistic AP VP Percent: 0.14 %
Brady Statistic AP VS Percent: 0.02 %
Brady Statistic AS VP Percent: 99.6 %
Brady Statistic AS VS Percent: 0.24 %
Brady Statistic RV Percent Paced: 99.7 %
Date Time Interrogation Session: 20180301193955
Implantable Lead Implant Date: 20150212
Implantable Lead Location: 753859
Lead Channel Impedance Value: 361 Ohm
Lead Channel Impedance Value: 437 Ohm
Lead Channel Impedance Value: 456 Ohm
Lead Channel Impedance Value: 494 Ohm
Lead Channel Impedance Value: 532 Ohm
Lead Channel Pacing Threshold Amplitude: 0.5 V
Lead Channel Pacing Threshold Amplitude: 1.875 V
Lead Channel Pacing Threshold Pulse Width: 0.4 ms
Lead Channel Sensing Intrinsic Amplitude: 1.5 mV
Lead Channel Sensing Intrinsic Amplitude: 20 mV
Lead Channel Sensing Intrinsic Amplitude: 20 mV
Lead Channel Setting Pacing Amplitude: 2 V
Lead Channel Setting Sensing Sensitivity: 0.9 mV
MDC IDC LEAD IMPLANT DT: 20150212
MDC IDC LEAD IMPLANT DT: 20150212
MDC IDC LEAD LOCATION: 753858
MDC IDC LEAD LOCATION: 753860
MDC IDC MSMT BATTERY REMAINING LONGEVITY: 52 mo
MDC IDC MSMT BATTERY VOLTAGE: 3 V
MDC IDC MSMT LEADCHNL LV IMPEDANCE VALUE: 570 Ohm
MDC IDC MSMT LEADCHNL RA IMPEDANCE VALUE: 380 Ohm
MDC IDC MSMT LEADCHNL RA IMPEDANCE VALUE: 399 Ohm
MDC IDC MSMT LEADCHNL RA PACING THRESHOLD PULSEWIDTH: 0.4 ms
MDC IDC MSMT LEADCHNL RA SENSING INTR AMPL: 1.5 mV
MDC IDC MSMT LEADCHNL RV IMPEDANCE VALUE: 418 Ohm
MDC IDC MSMT LEADCHNL RV PACING THRESHOLD AMPLITUDE: 0.75 V
MDC IDC MSMT LEADCHNL RV PACING THRESHOLD PULSEWIDTH: 0.4 ms
MDC IDC PG IMPLANT DT: 20150212
MDC IDC SET LEADCHNL LV PACING AMPLITUDE: 2.5 V
MDC IDC SET LEADCHNL LV PACING PULSEWIDTH: 0.8 ms
MDC IDC SET LEADCHNL RA PACING AMPLITUDE: 1.5 V
MDC IDC SET LEADCHNL RV PACING PULSEWIDTH: 0.4 ms
MDC IDC STAT BRADY RA PERCENT PACED: 0.16 %

## 2016-06-27 ENCOUNTER — Other Ambulatory Visit: Payer: Self-pay | Admitting: Obstetrics and Gynecology

## 2016-06-29 ENCOUNTER — Other Ambulatory Visit: Payer: Self-pay | Admitting: Internal Medicine

## 2016-06-29 ENCOUNTER — Encounter: Payer: Self-pay | Admitting: Internal Medicine

## 2016-06-30 MED ORDER — COLCHICINE 0.6 MG PO TABS: 0.6000 mg | ORAL_TABLET | Freq: Two times a day (BID) | ORAL | 0 refills | Status: DC | PRN

## 2016-07-16 ENCOUNTER — Other Ambulatory Visit: Payer: Self-pay | Admitting: Internal Medicine

## 2016-07-16 NOTE — Telephone Encounter (Signed)
Refilled 06/11/2016 Last OV: 06/11/2016 Next OV: 09/11/2016

## 2016-07-16 NOTE — Telephone Encounter (Signed)
refilled 

## 2016-07-28 ENCOUNTER — Encounter: Payer: Self-pay | Admitting: Internal Medicine

## 2016-07-29 ENCOUNTER — Other Ambulatory Visit: Payer: Self-pay | Admitting: Internal Medicine

## 2016-07-29 MED ORDER — PREDNISONE 10 MG PO TABS: ORAL_TABLET | ORAL | 0 refills | Status: DC

## 2016-07-29 MED ORDER — ALLOPURINOL 100 MG PO TABS: 100.0000 mg | ORAL_TABLET | Freq: Every day | ORAL | 6 refills | Status: DC

## 2016-07-29 NOTE — Telephone Encounter (Signed)
No diarrhea expected with alloprinol.  Please start prednisone and allopurinol on same day

## 2016-07-29 NOTE — Telephone Encounter (Signed)
Spoke with Julia Pierce and informed her of Dr. Lupita Dawn message. Julia Pierce gave a verbal understanding. Julia Pierce said thank you very much.

## 2016-08-07 ENCOUNTER — Other Ambulatory Visit: Payer: Self-pay | Admitting: Internal Medicine

## 2016-08-18 ENCOUNTER — Encounter: Payer: Self-pay | Admitting: Internal Medicine

## 2016-08-19 NOTE — Telephone Encounter (Signed)
Lab Results  Component Value Date   LABURIC 7.7 (H) 09/25/2014

## 2016-08-21 MED ORDER — FEBUXOSTAT 80 MG PO TABS: 1.0000 | ORAL_TABLET | Freq: Every day | ORAL | 4 refills | Status: DC

## 2016-08-21 NOTE — Addendum Note (Signed)
Addended by: Crecencio Mc on: 08/21/2016 01:59 PM   Modules accepted: Orders

## 2016-08-26 ENCOUNTER — Other Ambulatory Visit: Payer: Self-pay | Admitting: Internal Medicine

## 2016-08-27 DIAGNOSIS — Z6830 Body mass index (BMI) 30.0-30.9, adult: Secondary | ICD-10-CM | POA: Diagnosis not present

## 2016-08-27 DIAGNOSIS — Z1231 Encounter for screening mammogram for malignant neoplasm of breast: Secondary | ICD-10-CM | POA: Diagnosis not present

## 2016-08-27 DIAGNOSIS — Z01411 Encounter for gynecological examination (general) (routine) with abnormal findings: Secondary | ICD-10-CM | POA: Diagnosis not present

## 2016-08-27 DIAGNOSIS — N3941 Urge incontinence: Secondary | ICD-10-CM | POA: Diagnosis not present

## 2016-08-27 DIAGNOSIS — N8111 Cystocele, midline: Secondary | ICD-10-CM | POA: Diagnosis not present

## 2016-08-27 DIAGNOSIS — N816 Rectocele: Secondary | ICD-10-CM | POA: Diagnosis not present

## 2016-09-10 ENCOUNTER — Other Ambulatory Visit: Payer: BLUE CROSS/BLUE SHIELD | Admitting: Internal Medicine

## 2016-09-10 ENCOUNTER — Ambulatory Visit: Payer: BLUE CROSS/BLUE SHIELD | Admitting: Internal Medicine

## 2016-09-10 ENCOUNTER — Other Ambulatory Visit (INDEPENDENT_AMBULATORY_CARE_PROVIDER_SITE_OTHER): Payer: BLUE CROSS/BLUE SHIELD

## 2016-09-10 DIAGNOSIS — E785 Hyperlipidemia, unspecified: Secondary | ICD-10-CM | POA: Diagnosis not present

## 2016-09-10 DIAGNOSIS — E119 Type 2 diabetes mellitus without complications: Secondary | ICD-10-CM | POA: Diagnosis not present

## 2016-09-10 LAB — LIPID PANEL
Cholesterol: 213 mg/dL — ABNORMAL HIGH (ref 0–200)
HDL: 36.9 mg/dL — AB (ref >39.00)
NONHDL: 175.71
Total CHOL/HDL Ratio: 6
Triglycerides: 313 mg/dL — ABNORMAL HIGH (ref 0.0–149.0)
VLDL: 62.6 mg/dL — AB (ref 0.0–40.0)

## 2016-09-10 LAB — COMPREHENSIVE METABOLIC PANEL
ALT: 14 U/L (ref 0–35)
AST: 17 U/L (ref 0–37)
Albumin: 4.3 g/dL (ref 3.5–5.2)
Alkaline Phosphatase: 82 U/L (ref 39–117)
BUN: 15 mg/dL (ref 6–23)
CHLORIDE: 105 meq/L (ref 96–112)
CO2: 27 meq/L (ref 19–32)
Calcium: 9.7 mg/dL (ref 8.4–10.5)
Creatinine, Ser: 0.72 mg/dL (ref 0.40–1.20)
GFR: 87.2 mL/min (ref >60.00)
GLUCOSE: 130 mg/dL — AB (ref 70–99)
POTASSIUM: 4.3 meq/L (ref 3.5–5.1)
Sodium: 143 mEq/L (ref 135–145)
Total Bilirubin: 0.3 mg/dL (ref 0.2–1.2)
Total Protein: 7.1 g/dL (ref 6.0–8.3)

## 2016-09-10 LAB — HEMOGLOBIN A1C: Hgb A1c MFr Bld: 7.3 % — ABNORMAL HIGH (ref 4.6–6.5)

## 2016-09-10 LAB — LDL CHOLESTEROL, DIRECT: Direct LDL: 124 mg/dL

## 2016-09-11 ENCOUNTER — Ambulatory Visit (INDEPENDENT_AMBULATORY_CARE_PROVIDER_SITE_OTHER): Payer: BLUE CROSS/BLUE SHIELD | Admitting: Internal Medicine

## 2016-09-11 ENCOUNTER — Telehealth: Payer: Self-pay | Admitting: *Deleted

## 2016-09-11 ENCOUNTER — Encounter: Payer: Self-pay | Admitting: Internal Medicine

## 2016-09-11 VITALS — BP 122/84 | HR 72 | Temp 97.8°F | Resp 16 | Ht 62.0 in | Wt 169.0 lb

## 2016-09-11 DIAGNOSIS — I10 Essential (primary) hypertension: Secondary | ICD-10-CM

## 2016-09-11 DIAGNOSIS — E119 Type 2 diabetes mellitus without complications: Secondary | ICD-10-CM

## 2016-09-11 DIAGNOSIS — R197 Diarrhea, unspecified: Secondary | ICD-10-CM | POA: Diagnosis not present

## 2016-09-11 DIAGNOSIS — M109 Gout, unspecified: Secondary | ICD-10-CM

## 2016-09-11 DIAGNOSIS — E785 Hyperlipidemia, unspecified: Secondary | ICD-10-CM

## 2016-09-11 DIAGNOSIS — R5383 Other fatigue: Secondary | ICD-10-CM

## 2016-09-11 LAB — CBC WITH DIFFERENTIAL/PLATELET
BASOS PCT: 0.9 % (ref 0.0–3.0)
Basophils Absolute: 0.1 10*3/uL (ref 0.0–0.1)
EOS PCT: 1.3 % (ref 0.0–5.0)
Eosinophils Absolute: 0.1 10*3/uL (ref 0.0–0.7)
HEMATOCRIT: 40.8 % (ref 36.0–46.0)
HEMOGLOBIN: 13.6 g/dL (ref 12.0–15.0)
LYMPHS PCT: 28.5 % (ref 12.0–46.0)
Lymphs Abs: 2.3 10*3/uL (ref 0.7–4.0)
MCHC: 33.3 g/dL (ref 30.0–36.0)
MCV: 84.6 fl (ref 78.0–100.0)
MONO ABS: 0.5 10*3/uL (ref 0.1–1.0)
Monocytes Relative: 6.4 % (ref 3.0–12.0)
Neutro Abs: 5 10*3/uL (ref 1.4–7.7)
Neutrophils Relative %: 62.9 % (ref 43.0–77.0)
Platelets: 257 10*3/uL (ref 150.0–400.0)
RBC: 4.82 Mil/uL (ref 3.87–5.11)
RDW: 14.8 % (ref 11.5–15.5)
WBC: 8 10*3/uL (ref 4.0–10.5)

## 2016-09-11 LAB — URIC ACID: URIC ACID, SERUM: 2.3 mg/dL — AB (ref 2.4–7.0)

## 2016-09-11 MED ORDER — CELECOXIB 100 MG PO CAPS: 100.0000 mg | ORAL_CAPSULE | Freq: Two times a day (BID) | ORAL | 2 refills | Status: DC

## 2016-09-11 NOTE — Telephone Encounter (Signed)
Please advise if pt will need labs prior to her appt in August  Pt contact [phone]

## 2016-09-11 NOTE — Patient Instructions (Addendum)
Your diarrhea may be coming from lactose intolerance  Please start using the OTC enzyme "lactase" with every meal that has dairy ,  And tyr using Lactiad milk or almond milk to see if the diarrhea improves.  I will order a uric aid level today .  If skippig one dose of uloric results in NO DIARRHEA,  Then we will find another medication to treat our gout.   I have prescribed celebrex, to use daily as an  anti inflammatory while we are sorting  this out   Cereal and oatmeal still have > 20 carbs per "measured" servings so that may be why your A1c is still > 7.0  plesase check your blood sugar 2 hours after your usual breakfast so we can determine if you need to take a medication  with your breakfast   Your cough may becoming from pollen.  Try flushing  Your sinuses with Milta Deiters Meds sinus rinse.

## 2016-09-11 NOTE — Progress Notes (Signed)
Subjective:  Patient ID: Julia Pierce, female    DOB: [birth year]  Age: 61 y.o. MRN: [id number]  CC: The primary encounter diagnosis was Gout of big toe. Diagnoses of Diarrhea of presumed infectious origin and Diabetes mellitus type 2, diet-controlled ([place]) were also pertinent to this visit.  HPI [name] presents for  Follow up on type 2 dm  And persistent right great toe pain secondary to gout.  She has had a recent loss of control  Of diabetes (slight) Due to decreased exercise. Has been limiting sweets except for fruit.  Fasting 133,  Average  2 hr post prandials  Highest was 222 after prednisone taper .  LDL has decreased from 144 to 124 and trigs down to 313   Has intentnionally dropped 8 l bs.    Foot exam done   Having persistent loose stools, sometimes projectile  Since March .  Attributed to gout medication ,  Taking  Uloric (did not improve with change from allopurinol)  For persistent gout right great toe.  Only 2 solid stools since March , averaging 3-4 daily.  Aggravated by ingestion of dairy products,  History of  Mild lAllergy noted years ago per patient  Dry cough uncontolled at times  Saw dr Garwin Brothers in early May,  Referred for rectocele and cystocele repair;  second opinion   Lab Results  Component Value Date   LABURIC 2.3 (L) 09/11/2016      Lab Results  Component Value Date   HGBA1C 7.3 (H) 09/10/2016     Outpatient Medications Prior to Visit  Medication Sig Dispense Refill  . aspirin EC 81 MG tablet Take 81 mg by mouth daily.    . Azelaic Acid (FINACEA EX) Apply 1 application topically 2 (two) times daily.    . B Complex Vitamins (B COMPLEX PO) Take 1 tablet by mouth daily.     . calcium elemental as carbonate (BARIATRIC TUMS ULTRA) 400 MG chewable tablet Chew by mouth.    . Cholecalciferol (VITAMIN D3) 2000 UNITS TABS Take 1 tablet by mouth daily.    Marland Kitchen esomeprazole (NEXIUM) 20 MG capsule Take 20 mg by mouth daily at 12 noon.     . Febuxostat 80 MG  TABS Take 1 tablet (80 mg total) by mouth daily. 30 tablet 4  . fluticasone (FLONASE) 50 MCG/ACT nasal spray Place 2 sprays into both nostrils daily.    . furosemide (LASIX) 20 MG tablet Take 1 tablet (20 mg total) by mouth daily. As needed for fluid retention 30 tablet 0  . losartan-hydrochlorothiazide (HYZAAR) 50-12.5 MG tablet TAKE 1 TABLET BY MOUTH EVERY DAY 90 tablet 1  . methocarbamol (ROBAXIN) 500 MG tablet LIMIT 1 TABLET BY MOUTH PER DAY OR 2-3 TIMES PER DAY IF TOLERATED 90 tablet 0  . metoprolol tartrate (LOPRESSOR) 25 MG tablet Take 12.5 mg by mouth daily. Reported on 05/17/2015    . Multiple Vitamin (MULTIVITAMIN) tablet Take 1 tablet by mouth daily.      . sertraline (ZOLOFT) 100 MG tablet TAKE 1 TABLET BY MOUTH EVERY DAY 90 tablet 1  . spironolactone (ALDACTONE) 25 MG tablet Take 25 mg by mouth daily.    Marland Kitchen tolterodine (DETROL) 2 MG tablet TAKE 1 TABLET TWICE A DAY 180 tablet 4  . sertraline (ZOLOFT) 100 MG tablet Take 1 tablet (100 mg total) by mouth daily. 90 tablet 3  . allopurinol (ZYLOPRIM) 100 MG tablet Take 1 tablet (100 mg total) by mouth daily. (Patient not taking: Reported on  09/11/2016) 30 tablet 6  . colchicine 0.6 MG tablet TAKE 1 TABLET (0.6 MG TOTAL) BY MOUTH 2 (TWO) TIMES DAILY. (Patient not taking: Reported on 09/11/2016) 60 tablet 0  . NONFORMULARY OR COMPOUNDED ITEM [facility]:  Antiinflammatory Cream - Diclofenac 3%, Baclofen 2%, Lidocaine 2%, apply 1-2 grams 3-4 times a day. (Patient not taking: Reported on 09/11/2016) 480 each 11  . predniSONE (DELTASONE) 10 MG tablet 6 tablets daily x 3 days,  then reduce by 1 tablet daily until gone (Patient not taking: Reported on 09/11/2016) 33 tablet 0  . Red Yeast Rice 600 MG CAPS Take 1 capsule (600 mg total) by mouth 2 (two) times daily. (Patient not taking: Reported on 09/11/2016) 60 capsule 2   Facility-Administered Medications Prior to Visit  Medication Dose Route Frequency Provider Last Rate Last Dose  . diclofenac  sodium (VOLTAREN) 1 % transdermal gel 4 g  4 g Topical QID Mohammed Kindle, MD      . midazolam (VERSED) 5 MG/5ML injection 5 mg  5 mg Intravenous Once Mohammed Kindle, MD      . orphenadrine (NORFLEX) injection 60 mg  60 mg Intramuscular Once Mohammed Kindle, MD      . sodium chloride 0.9 % injection 20 mL  20 mL Other Once Mohammed Kindle, MD      . triamcinolone acetonide (KENALOG-40) injection 40 mg  40 mg Other Once Mohammed Kindle, MD        Review of Systems;  Patient denies headache, fevers, malaise, unintentional weight loss, skin rash, eye pain, sinus congestion and sinus pain, sore throat, dysphagia,  hemoptysis , cough, dyspnea, wheezing, chest pain, palpitations, orthopnea, edema, abdominal pain, nausea, melena, diarrhea, constipation, flank pain, dysuria, hematuria, urinary  Frequency, nocturia, numbness, tingling, seizures,  Focal weakness, Loss of consciousness,  Tremor, insomnia, depression, anxiety, and suicidal ideation.      Objective:  BP 122/84 (BP Location: Left Arm, Patient Position: Sitting, Cuff Size: Normal)   Pulse 72   Temp 97.8 F (36.6 C) (Oral)   Resp 16   Ht 5\' 2"  (1.575 m)   Wt 169 lb (76.7 kg)   SpO2 98%   BMI 30.91 kg/m   BP Readings from Last 3 Encounters:  09/11/16 122/84  06/11/16 130/80  03/24/16 132/80    Wt Readings from Last 3 Encounters:  09/11/16 169 lb (76.7 kg)  06/11/16 177 lb (80.3 kg)  03/24/16 177 lb 6.4 oz (80.5 kg)    General appearance: alert, cooperative and appears stated age Ears: normal TM's and external ear canals both ears Throat: lips, mucosa, and tongue normal; teeth and gums normal Neck: no adenopathy, no carotid bruit, supple, symmetrical, trachea midline and thyroid not enlarged, symmetric, no tenderness/mass/nodules Back: symmetric, no curvature. ROM normal. No CVA tenderness. Lungs: clear to auscultation bilaterally Heart: regular rate and rhythm, S1, S2 normal, no murmur, click, rub or gallop Abdomen: soft,  non-tender; bowel sounds normal; no masses,  no organomegaly Pulses: 2+ and symmetric Skin: Skin color, texture, turgor normal. No rashes or lesions Lymph nodes: Cervical, supraclavicular, and axillary nodes normal.  Lab Results  Component Value Date   HGBA1C 7.3 (H) 09/10/2016   HGBA1C 7.3 (H) 06/09/2016   HGBA1C 6.7 (H) 03/03/2016    Lab Results  Component Value Date   CREATININE 0.72 09/10/2016   CREATININE 0.61 06/09/2016   CREATININE 0.73 03/03/2016    Lab Results  Component Value Date   WBC 8.0 09/11/2016   HGB 13.6 09/11/2016   HCT  40.8 09/11/2016   PLT 257.0 09/11/2016   GLUCOSE 130 (H) 09/10/2016   CHOL 213 (H) 09/10/2016   TRIG 313.0 (H) 09/10/2016   HDL 36.90 (L) 09/10/2016   LDLDIRECT 124.0 09/10/2016   LDLCALC 88 08/17/2013   ALT 14 09/10/2016   AST 17 09/10/2016   NA 143 09/10/2016   K 4.3 09/10/2016   CL 105 09/10/2016   CREATININE 0.72 09/10/2016   BUN 15 09/10/2016   CO2 27 09/10/2016   TSH 3.11 05/09/2013   HGBA1C 7.3 (H) 09/10/2016   MICROALBUR 0.7 06/09/2016    No results found.  Assessment & Plan:   Problem List Items Addressed This Visit    Gout of big toe - Primary    prolonged episode requiring  Multiple prednisone tapers.  Not tolerating allopurinol or uloric due to persistent diarrhea since March .  Will suspend medication,  Prescribe celebrex instead.  Lab Results  Component Value Date   LABURIC 2.3 (L) 09/11/2016         Relevant Medications   celecoxib (CELEBREX) 100 MG capsule   Other Relevant Orders   Uric acid (Completed)   Diabetes mellitus type 2, diet-controlled ([facility])    Slight loss of control due to prednisone use and inactivity.  No medications at this time. Long discussion about the f low carb diet   Lab Results  Component Value Date   HGBA1C 7.3 (H) 09/10/2016   Lab Results  Component Value Date   MICROALBUR 0.7 06/09/2016          Other Visit Diagnoses    Diarrhea of presumed infectious origin        Relevant Orders   CBC with Differential/Platelet (Completed)     A total of 25 minutes of face to face time was spent with patient more than half of which was spent in counselling about the above mentioned conditions  and coordination of care  I have discontinued Ms. Erby's Red Yeast Rice, NONFORMULARY OR COMPOUNDED ITEM, colchicine, predniSONE, allopurinol, and FINACEA. I am also having her start on celecoxib. Additionally, I am having her maintain her multivitamin, spironolactone, sertraline, B Complex Vitamins (B COMPLEX PO), Azelaic Acid (FINACEA EX), Vitamin D3, aspirin EC, esomeprazole, fluticasone, metoprolol tartrate, furosemide, tolterodine, calcium elemental as carbonate, losartan-hydrochlorothiazide, sertraline, Febuxostat, and methocarbamol. We will continue to administer midazolam, orphenadrine, sodium chloride, triamcinolone acetonide, and diclofenac sodium.  Meds ordered this encounter  Medications  . DISCONTD: FINACEA 15 % cream    Sig: APPLY TO AFFECTED AREA(S) ONCE OR TWICE A DAY    Refill:  3  . celecoxib (CELEBREX) 100 MG capsule    Sig: Take 1 capsule (100 mg total) by mouth 2 (two) times daily. For gout pain    Dispense:  60 capsule    Refill:  2    Medications Discontinued During This Encounter  Medication Reason  . allopurinol (ZYLOPRIM) 100 MG tablet Patient has not taken in last 30 days  . colchicine 0.6 MG tablet Patient has not taken in last 30 days  . NONFORMULARY OR COMPOUNDED ITEM Patient has not taken in last 30 days  . predniSONE (DELTASONE) 10 MG tablet Patient has not taken in last 30 days  . Red Yeast Rice 600 MG CAPS Patient has not taken in last 30 days  . FINACEA 15 % cream Duplicate    Follow-up: No Follow-up on file.   Crecencio Mc, MD

## 2016-09-12 NOTE — Telephone Encounter (Signed)
Ordered labs. Spoke with pt to let her know that she would need lab work done before. Scheduled pt a fasting lab appt.

## 2016-09-14 NOTE — Assessment & Plan Note (Addendum)
prolonged episode requiring  Multiple prednisone tapers.  Not tolerating allopurinol or uloric due to persistent diarrhea since March .  Will suspend medication,  Prescribe celebrex instead.  Lab Results  Component Value Date   LABURIC 2.3 (L) 09/11/2016

## 2016-09-14 NOTE — Assessment & Plan Note (Addendum)
Slight loss of control due to prednisone use and inactivity.  No medications at this time. Long discussion about the f low carb diet   Lab Results  Component Value Date   HGBA1C 7.3 (H) 09/10/2016   Lab Results  Component Value Date   MICROALBUR 0.7 06/09/2016

## 2016-09-15 ENCOUNTER — Encounter: Payer: Self-pay | Admitting: Internal Medicine

## 2016-09-18 ENCOUNTER — Ambulatory Visit (INDEPENDENT_AMBULATORY_CARE_PROVIDER_SITE_OTHER): Payer: BLUE CROSS/BLUE SHIELD | Admitting: *Deleted

## 2016-09-18 DIAGNOSIS — I428 Other cardiomyopathies: Secondary | ICD-10-CM

## 2016-09-19 NOTE — Progress Notes (Signed)
Remote pacemaker transmission.   

## 2016-09-24 ENCOUNTER — Other Ambulatory Visit: Payer: Self-pay | Admitting: Internal Medicine

## 2016-09-24 LAB — CUP PACEART REMOTE DEVICE CHECK
Battery Remaining Longevity: 52 mo
Brady Statistic AP VP Percent: 0.22 %
Brady Statistic RA Percent Paced: 0.25 %
Brady Statistic RV Percent Paced: 99.71 %
Date Time Interrogation Session: 20180531171411
Implantable Lead Implant Date: 20150212
Implantable Lead Location: 753859
Implantable Pulse Generator Implant Date: 20150212
Lead Channel Impedance Value: 361 Ohm
Lead Channel Impedance Value: 418 Ohm
Lead Channel Impedance Value: 456 Ohm
Lead Channel Impedance Value: 475 Ohm
Lead Channel Impedance Value: 532 Ohm
Lead Channel Impedance Value: 551 Ohm
Lead Channel Pacing Threshold Amplitude: 0.5 V
Lead Channel Pacing Threshold Amplitude: 0.75 V
Lead Channel Sensing Intrinsic Amplitude: 1.25 mV
Lead Channel Sensing Intrinsic Amplitude: 1.25 mV
Lead Channel Setting Pacing Amplitude: 1.5 V
Lead Channel Setting Pacing Amplitude: 2 V
Lead Channel Setting Pacing Pulse Width: 0.4 ms
Lead Channel Setting Sensing Sensitivity: 0.9 mV
MDC IDC LEAD IMPLANT DT: 20150212
MDC IDC LEAD IMPLANT DT: 20150212
MDC IDC LEAD LOCATION: 753858
MDC IDC LEAD LOCATION: 753860
MDC IDC MSMT BATTERY VOLTAGE: 3 V
MDC IDC MSMT LEADCHNL LV PACING THRESHOLD AMPLITUDE: 1.875 V
MDC IDC MSMT LEADCHNL LV PACING THRESHOLD PULSEWIDTH: 0.4 ms
MDC IDC MSMT LEADCHNL RA IMPEDANCE VALUE: 380 Ohm
MDC IDC MSMT LEADCHNL RA IMPEDANCE VALUE: 380 Ohm
MDC IDC MSMT LEADCHNL RA PACING THRESHOLD PULSEWIDTH: 0.4 ms
MDC IDC MSMT LEADCHNL RV IMPEDANCE VALUE: 399 Ohm
MDC IDC MSMT LEADCHNL RV PACING THRESHOLD PULSEWIDTH: 0.4 ms
MDC IDC MSMT LEADCHNL RV SENSING INTR AMPL: 13.75 mV
MDC IDC MSMT LEADCHNL RV SENSING INTR AMPL: 13.75 mV
MDC IDC SET LEADCHNL LV PACING AMPLITUDE: 2.5 V
MDC IDC SET LEADCHNL LV PACING PULSEWIDTH: 0.8 ms
MDC IDC STAT BRADY AP VS PERCENT: 0.03 %
MDC IDC STAT BRADY AS VP PERCENT: 99.54 %
MDC IDC STAT BRADY AS VS PERCENT: 0.21 %

## 2016-09-26 ENCOUNTER — Encounter: Payer: Self-pay | Admitting: Cardiology

## 2016-09-30 ENCOUNTER — Telehealth: Payer: Self-pay | Admitting: Internal Medicine

## 2016-09-30 DIAGNOSIS — M109 Gout, unspecified: Secondary | ICD-10-CM

## 2016-09-30 NOTE — Telephone Encounter (Signed)
Done. Thanks.

## 2016-09-30 NOTE — Telephone Encounter (Signed)
Pt wanted to let Dr. Derrel Nip know that she has an appt with Dr. Matilde Sprang (Urology) on July 25th.   Also that the lactate is working well for her.  Pt would also like to remind Dr. Derrel Nip to order uric acid labs for her August visit.

## 2016-10-15 DIAGNOSIS — Z950 Presence of cardiac pacemaker: Secondary | ICD-10-CM | POA: Diagnosis not present

## 2016-10-15 DIAGNOSIS — I10 Essential (primary) hypertension: Secondary | ICD-10-CM | POA: Diagnosis not present

## 2016-10-15 DIAGNOSIS — I420 Dilated cardiomyopathy: Secondary | ICD-10-CM | POA: Diagnosis not present

## 2016-10-15 DIAGNOSIS — E782 Mixed hyperlipidemia: Secondary | ICD-10-CM | POA: Diagnosis not present

## 2016-10-25 ENCOUNTER — Other Ambulatory Visit: Payer: Self-pay | Admitting: Internal Medicine

## 2016-10-27 ENCOUNTER — Telehealth: Payer: Self-pay

## 2016-10-27 MED ORDER — TIZANIDINE HCL 4 MG PO CAPS: 4.0000 mg | ORAL_CAPSULE | Freq: Three times a day (TID) | ORAL | 2 refills | Status: DC

## 2016-10-27 NOTE — Addendum Note (Signed)
Addended by: Crecencio Mc on: 10/27/2016 01:34 PM   Modules accepted: Orders

## 2016-10-27 NOTE — Telephone Encounter (Signed)
Tizanidine sent.   MyChart message sent

## 2016-10-27 NOTE — Telephone Encounter (Signed)
CS sent request, methocarbamol is on back order with no fill date, please send in alternative,   Original order was for Methocarbamol 500mg  tablet limit 1 tablet by mouth daily or 2-3 if tolerated thanks

## 2016-10-29 ENCOUNTER — Other Ambulatory Visit: Payer: Self-pay | Admitting: Internal Medicine

## 2016-10-29 ENCOUNTER — Telehealth: Payer: Self-pay | Admitting: Internal Medicine

## 2016-10-29 NOTE — Telephone Encounter (Signed)
Pharmacy called requesting this medication. The other medication is making the patient fatigued. Please advise, thank you!  Pharmacy - [id number] - [place], [place]

## 2016-10-29 NOTE — Telephone Encounter (Signed)
Pharmacy called and stated that the pt was taking methocarbamol but then it was back ordered so then tizanidine was sent in. The pt stated that the tizanidine was making her very fatigued and is wanting to know if she can go back on the methocarbamol since the pharmacy now has it in stock.

## 2016-10-30 MED ORDER — METHOCARBAMOL 500 MG PO TABS: ORAL_TABLET | ORAL | 5 refills | Status: DC

## 2016-10-30 NOTE — Telephone Encounter (Signed)
Of course,  rx sent

## 2016-11-04 ENCOUNTER — Other Ambulatory Visit: Payer: Self-pay | Admitting: Internal Medicine

## 2016-11-12 DIAGNOSIS — R350 Frequency of micturition: Secondary | ICD-10-CM | POA: Diagnosis not present

## 2016-11-12 DIAGNOSIS — R351 Nocturia: Secondary | ICD-10-CM | POA: Diagnosis not present

## 2016-11-12 DIAGNOSIS — N3946 Mixed incontinence: Secondary | ICD-10-CM | POA: Diagnosis not present

## 2016-12-12 ENCOUNTER — Other Ambulatory Visit (INDEPENDENT_AMBULATORY_CARE_PROVIDER_SITE_OTHER): Payer: BLUE CROSS/BLUE SHIELD

## 2016-12-12 DIAGNOSIS — M109 Gout, unspecified: Secondary | ICD-10-CM

## 2016-12-12 DIAGNOSIS — I10 Essential (primary) hypertension: Secondary | ICD-10-CM | POA: Diagnosis not present

## 2016-12-12 DIAGNOSIS — E785 Hyperlipidemia, unspecified: Secondary | ICD-10-CM | POA: Diagnosis not present

## 2016-12-12 DIAGNOSIS — E119 Type 2 diabetes mellitus without complications: Secondary | ICD-10-CM

## 2016-12-12 DIAGNOSIS — R5383 Other fatigue: Secondary | ICD-10-CM

## 2016-12-12 LAB — COMPREHENSIVE METABOLIC PANEL
ALK PHOS: 80 U/L (ref 39–117)
ALT: 12 U/L (ref 0–35)
AST: 15 U/L (ref 0–37)
Albumin: 4.4 g/dL (ref 3.5–5.2)
BUN: 15 mg/dL (ref 6–23)
CO2: 31 mEq/L (ref 19–32)
Calcium: 9.8 mg/dL (ref 8.4–10.5)
Chloride: 103 mEq/L (ref 96–112)
Creatinine, Ser: 0.73 mg/dL (ref 0.40–1.20)
GFR: 85.75 mL/min (ref >60.00)
GLUCOSE: 135 mg/dL — AB (ref 70–99)
POTASSIUM: 4.5 meq/L (ref 3.5–5.1)
SODIUM: 142 meq/L (ref 135–145)
Total Bilirubin: 0.4 mg/dL (ref 0.2–1.2)
Total Protein: 6.8 g/dL (ref 6.0–8.3)

## 2016-12-12 LAB — LIPID PANEL
Cholesterol: 223 mg/dL — ABNORMAL HIGH (ref 0–200)
HDL: 35.3 mg/dL — ABNORMAL LOW (ref >39.00)
NonHDL: 188.15
Total CHOL/HDL Ratio: 6
Triglycerides: 385 mg/dL — ABNORMAL HIGH (ref 0.0–149.0)
VLDL: 77 mg/dL — ABNORMAL HIGH (ref 0.0–40.0)

## 2016-12-12 LAB — VITAMIN D 25 HYDROXY (VIT D DEFICIENCY, FRACTURES): VITD: 43.82 ng/mL (ref 30.00–100.00)

## 2016-12-12 LAB — TSH: TSH: 3.8 u[IU]/mL (ref 0.35–4.50)

## 2016-12-12 LAB — LDL CHOLESTEROL, DIRECT: Direct LDL: 125 mg/dL

## 2016-12-12 LAB — HEMOGLOBIN A1C: HEMOGLOBIN A1C: 6.9 % — AB (ref 4.6–6.5)

## 2016-12-12 LAB — URIC ACID: Uric Acid, Serum: 7.5 mg/dL — ABNORMAL HIGH (ref 2.4–7.0)

## 2016-12-14 ENCOUNTER — Encounter: Payer: Self-pay | Admitting: Internal Medicine

## 2016-12-15 ENCOUNTER — Ambulatory Visit (INDEPENDENT_AMBULATORY_CARE_PROVIDER_SITE_OTHER): Payer: BLUE CROSS/BLUE SHIELD | Admitting: Internal Medicine

## 2016-12-15 ENCOUNTER — Ambulatory Visit: Payer: BLUE CROSS/BLUE SHIELD

## 2016-12-15 ENCOUNTER — Encounter: Payer: Self-pay | Admitting: Internal Medicine

## 2016-12-15 VITALS — BP 128/68 | HR 74 | Temp 98.7°F | Resp 15 | Ht 62.5 in | Wt 167.4 lb

## 2016-12-15 VITALS — BP 128/68 | HR 74 | Temp 98.7°F | Resp 14 | Ht 62.5 in | Wt 167.4 lb

## 2016-12-15 DIAGNOSIS — E669 Obesity, unspecified: Secondary | ICD-10-CM

## 2016-12-15 DIAGNOSIS — M4722 Other spondylosis with radiculopathy, cervical region: Secondary | ICD-10-CM

## 2016-12-15 DIAGNOSIS — M4802 Spinal stenosis, cervical region: Secondary | ICD-10-CM | POA: Diagnosis not present

## 2016-12-15 DIAGNOSIS — E785 Hyperlipidemia, unspecified: Secondary | ICD-10-CM | POA: Diagnosis not present

## 2016-12-15 DIAGNOSIS — M109 Gout, unspecified: Secondary | ICD-10-CM | POA: Diagnosis not present

## 2016-12-15 DIAGNOSIS — Z0000 Encounter for general adult medical examination without abnormal findings: Secondary | ICD-10-CM

## 2016-12-15 DIAGNOSIS — I10 Essential (primary) hypertension: Secondary | ICD-10-CM | POA: Diagnosis not present

## 2016-12-15 DIAGNOSIS — E119 Type 2 diabetes mellitus without complications: Secondary | ICD-10-CM

## 2016-12-15 NOTE — Progress Notes (Signed)
Subjective:  Patient ID: Julia Pierce, female    DOB: 09/22/1955  Age: 61 y.o. MRN: [id number]  CC: The primary encounter diagnosis was Cervical radiculopathy due to degenerative joint disease of spine. Diagnoses of Hyperlipidemia with target LDL less than 100, Essential hypertension, Diabetes mellitus type 2, diet-controlled ([place]), Cervical stenosis of spine, Gout of big toe, and Obesity (BMI 30.0-34.9) were also pertinent to this visit.  HPI Nesiah Jump presents for 3 month follow up on diabetes.  Patient has no complaints today.  Patient is following a low glycemic index diet and taking all prescribed medications regularly without side effects.  Fasting sugars have been under less than 140 most of the time and post prandials have been under 160 except on rare occasions. Patient is exercising about 3 times per week and intentionally trying to lose weight .  Patient has had an eye exam in the last 12 months and checks feet regularly for signs of infection.  Patient does not walk barefoot outside,  And denies an numbness tingling or burning in feet. Patient is up to date on all recommended vaccinations  Uric acid level elevated.  Last gout flare was 3 months ago .  Did not tolerate allopurinol and Uloric due to worsening of chronic diarrhea nothing on the right   Worsening chronic Neck pain with right sided radiculopathy presenting as numbness of the palms involving  fingers 2, 3,and 4  Present all the time.  Has a pacemaker  Last MRI 2014   Hip pain with spasms and sciatica on the right side has become more prevalent,  Throbs at night (hip)   Foot exam normal   Losartan dose was  lowered by cardiology for hypotension   Home readings have been 062 systolic high was 694    Lab Results  Component Value Date   HGBA1C 6.9 (H) 12/12/2016   Lab Results  Component Value Date   CHOL 223 (H) 12/12/2016   HDL 35.30 (L) 12/12/2016   LDLCALC 88 08/17/2013   LDLDIRECT 125.0 12/12/2016   TRIG 385.0  (H) 12/12/2016   CHOLHDL 6 12/12/2016    Outpatient Medications Prior to Visit  Medication Sig Dispense Refill  . aspirin EC 81 MG tablet Take 81 mg by mouth daily.    . Azelaic Acid (FINACEA EX) Apply 1 application topically 2 (two) times daily.    . B Complex Vitamins (B COMPLEX PO) Take 1 tablet by mouth daily.     . calcium elemental as carbonate (BARIATRIC TUMS ULTRA) 400 MG chewable tablet Chew by mouth.    . celecoxib (CELEBREX) 100 MG capsule Take 1 capsule (100 mg total) by mouth 2 (two) times daily. For gout pain 60 capsule 2  . Cholecalciferol (VITAMIN D3) 2000 UNITS TABS Take 1 tablet by mouth daily.    Marland Kitchen esomeprazole (NEXIUM) 20 MG capsule Take 20 mg by mouth daily at 12 noon.     . fluticasone (FLONASE) 50 MCG/ACT nasal spray Place 2 sprays into both nostrils daily.    . furosemide (LASIX) 20 MG tablet Take 1 tablet (20 mg total) by mouth daily. As needed for fluid retention 30 tablet 0  . losartan-hydrochlorothiazide (HYZAAR) 50-12.5 MG tablet TAKE 1 TABLET BY MOUTH EVERY DAY 90 tablet 1  . methocarbamol (ROBAXIN) 500 MG tablet LIMIT 1 TABLET BY MOUTH PER DAY OR 2-3 TIMES PER DAY AS NEEDED 90 tablet 5  . metoprolol tartrate (LOPRESSOR) 25 MG tablet Take 12.5 mg by mouth daily. Reported on 05/17/2015    .  Multiple Vitamin (MULTIVITAMIN) tablet Take 1 tablet by mouth daily.      . sertraline (ZOLOFT) 100 MG tablet TAKE 1 TABLET BY MOUTH EVERY DAY 90 tablet 1  . spironolactone (ALDACTONE) 25 MG tablet Take 25 mg by mouth daily.    Marland Kitchen tolterodine (DETROL) 2 MG tablet TAKE 1 TABLET TWICE A DAY 180 tablet 4  . sertraline (ZOLOFT) 100 MG tablet Take 1 tablet (100 mg total) by mouth daily. 90 tablet 3   Facility-Administered Medications Prior to Visit  Medication Dose Route Frequency Provider Last Rate Last Dose  . diclofenac sodium (VOLTAREN) 1 % transdermal gel 4 g  4 g Topical QID Mohammed Kindle, MD      . midazolam (VERSED) 5 MG/5ML injection 5 mg  5 mg Intravenous Once Mohammed Kindle, MD      . orphenadrine (NORFLEX) injection 60 mg  60 mg Intramuscular Once Mohammed Kindle, MD      . sodium chloride 0.9 % injection 20 mL  20 mL Other Once Mohammed Kindle, MD      . triamcinolone acetonide (KENALOG-40) injection 40 mg  40 mg Other Once Mohammed Kindle, MD        Review of Systems;  Patient denies headache, fevers, malaise, unintentional weight loss, skin rash, eye pain, sinus congestion and sinus pain, sore throat, dysphagia,  hemoptysis , cough, dyspnea, wheezing, chest pain, palpitations, orthopnea, edema, abdominal pain, nausea, melena, diarrhea, constipation, flank pain, dysuria, hematuria, urinary  Frequency, nocturia, numbness, tingling, seizures,  Focal weakness, Loss of consciousness,  Tremor, insomnia, depression, anxiety, and suicidal ideation.      Objective:  BP 128/68 (BP Location: Left Arm, Patient Position: Sitting, Cuff Size: Normal)   Pulse 74   Temp 98.7 F (37.1 C) (Oral)   Resp 15   Ht 5' 2.5" (1.588 m)   Wt 167 lb 6.4 oz (75.9 kg)   SpO2 98%   BMI 30.13 kg/m   BP Readings from Last 3 Encounters:  12/15/16 128/68  12/15/16 128/68  09/11/16 122/84    Wt Readings from Last 3 Encounters:  12/15/16 167 lb 6.4 oz (75.9 kg)  12/15/16 167 lb 6.4 oz (75.9 kg)  09/11/16 169 lb (76.7 kg)    General appearance: alert, cooperative and appears stated age Ears: normal TM's and external ear canals both ears Throat: lips, mucosa, and tongue normal; teeth and gums normal Neck: no adenopathy, no carotid bruit, supple, symmetrical, trachea midline and thyroid not enlarged, symmetric, no tenderness/mass/nodules Back: symmetric, no curvature. ROM normal. No CVA tenderness. Lungs: clear to auscultation bilaterally Heart: regular rate and rhythm, S1, S2 normal, no murmur, click, rub or gallop Abdomen: soft, non-tender; bowel sounds normal; no masses,  no organomegaly Pulses: 2+ and symmetric Skin: Skin color, texture, turgor normal. No rashes or  lesions Lymph nodes: Cervical, supraclavicular, and axillary nodes normal.  Lab Results  Component Value Date   HGBA1C 6.9 (H) 12/12/2016   HGBA1C 7.3 (H) 09/10/2016   HGBA1C 7.3 (H) 06/09/2016    Lab Results  Component Value Date   CREATININE 0.73 12/12/2016   CREATININE 0.72 09/10/2016   CREATININE 0.61 06/09/2016    Lab Results  Component Value Date   WBC 8.0 09/11/2016   HGB 13.6 09/11/2016   HCT 40.8 09/11/2016   PLT 257.0 09/11/2016   GLUCOSE 135 (H) 12/12/2016   CHOL 223 (H) 12/12/2016   TRIG 385.0 (H) 12/12/2016   HDL 35.30 (L) 12/12/2016   LDLDIRECT 125.0 12/12/2016   LDLCALC 88  08/17/2013   ALT 12 12/12/2016   AST 15 12/12/2016   NA 142 12/12/2016   K 4.5 12/12/2016   CL 103 12/12/2016   CREATININE 0.73 12/12/2016   BUN 15 12/12/2016   CO2 31 12/12/2016   TSH 3.80 12/12/2016   HGBA1C 6.9 (H) 12/12/2016   MICROALBUR 0.7 06/09/2016    No results found.  Assessment & Plan:   Problem List Items Addressed This Visit    Cervical stenosis of spine    Recent escalation of symptoms with numbness involving the of right hand.  CT spine ordered to rule out nerve root compression  Of C8      Diabetes mellitus type 2, diet-controlled ([facility])    Controlled,  a1c at goal without medications.    No medications at this time. Continue ARB.  Statin intolerant  Lab Results  Component Value Date   HGBA1C 6.9 (H) 12/12/2016   Lab Results  Component Value Date   MICROALBUR 0.7 06/09/2016         Relevant Orders   Hemoglobin A1c   Comprehensive metabolic panel   Gout of big toe    She has intolerance to allopurinol and uloric.  Her last flare was 3 months ago and resolved with prednisone.  Lab Results  Component Value Date   LABURIC 7.5 (H) 12/12/2016        Hyperlipidemia with target LDL less than 100   Relevant Orders   Lipid panel   Hypertension    Well controlled onetoprolol and  mlower dose of losartan . Renal function stable, no changes  today.  Lab Results  Component Value Date   CREATININE 0.73 12/12/2016   Lab Results  Component Value Date   NA 142 12/12/2016   K 4.5 12/12/2016   CL 103 12/12/2016   CO2 31 12/12/2016         Obesity (BMI 30.0-34.9)    I have addressed  BMI and recommended a low glycemic index diet utilizing smaller more frequent meals to increase metabolism.  I have also recommended that patient resume  exercising with a goal of 30 minutes of aerobic exercise a minimum of 5 days per week. y.         Other Visit Diagnoses    Cervical radiculopathy due to degenerative joint disease of spine    -  Primary   Relevant Orders   CT CERVICAL SPINE WO CONTRAST     A total of 25 minutes of face to face time was spent with patient more than half of which was spent in counselling about the above mentioned conditions  and coordination of care  I am having Ms. Julien Girt maintain her multivitamin, spironolactone, sertraline, B Complex Vitamins (B COMPLEX PO), Azelaic Acid (FINACEA EX), Vitamin D3, aspirin EC, esomeprazole, fluticasone, metoprolol tartrate, furosemide, calcium elemental as carbonate, sertraline, celecoxib, tolterodine, methocarbamol, and losartan-hydrochlorothiazide. We will continue to administer midazolam, orphenadrine, sodium chloride, triamcinolone acetonide, and diclofenac sodium.  No orders of the defined types were placed in this encounter.   There are no discontinued medications.  Follow-up: Return in about 3 months (around 03/17/2017) for follow up diabetes.   Crecencio Mc, MD

## 2016-12-15 NOTE — Progress Notes (Signed)
Subjective:   Julia Pierce is a 61 y.o. female who presents for an Initial Medicare Annual Wellness Visit.  Review of Systems    No ROS.  Medicare Wellness Visit. Additional risk factors are reflected in the social history.  Cardiac Risk Factors include: advanced age (>58men, >70 women);diabetes mellitus;obesity (BMI >30kg/m2)     Objective:    Today's Vitals   12/15/16 0906  BP: 128/68  Pulse: 74  Resp: 14  Temp: 98.7 F (37.1 C)  TempSrc: Oral  SpO2: 98%  Weight: 167 lb 6.4 oz (75.9 kg)  Height: 5' 2.5" (1.588 m)   Body mass index is 30.13 kg/m.   Current Medications (verified) Outpatient Encounter Prescriptions as of 12/15/2016  Medication Sig  . aspirin EC 81 MG tablet Take 81 mg by mouth daily.  . Azelaic Acid (FINACEA EX) Apply 1 application topically 2 (two) times daily.  . B Complex Vitamins (B COMPLEX PO) Take 1 tablet by mouth daily.   . calcium elemental as carbonate (BARIATRIC TUMS ULTRA) 400 MG chewable tablet Chew by mouth.  . Cholecalciferol (VITAMIN D3) 2000 UNITS TABS Take 1 tablet by mouth daily.  Marland Kitchen esomeprazole (NEXIUM) 20 MG capsule Take 20 mg by mouth daily at 12 noon.   . fluticasone (FLONASE) 50 MCG/ACT nasal spray Place 2 sprays into both nostrils daily.  . furosemide (LASIX) 20 MG tablet Take 1 tablet (20 mg total) by mouth daily. As needed for fluid retention  . losartan-hydrochlorothiazide (HYZAAR) 50-12.5 MG tablet TAKE 1 TABLET BY MOUTH EVERY DAY  . methocarbamol (ROBAXIN) 500 MG tablet LIMIT 1 TABLET BY MOUTH PER DAY OR 2-3 TIMES PER DAY AS NEEDED  . metoprolol tartrate (LOPRESSOR) 25 MG tablet Take 12.5 mg by mouth daily. Reported on 05/17/2015  . Multiple Vitamin (MULTIVITAMIN) tablet Take 1 tablet by mouth daily.    . sertraline (ZOLOFT) 100 MG tablet TAKE 1 TABLET BY MOUTH EVERY DAY  . spironolactone (ALDACTONE) 25 MG tablet Take 25 mg by mouth daily.  Marland Kitchen tolterodine (DETROL) 2 MG tablet TAKE 1 TABLET TWICE A DAY  . [DISCONTINUED]  Febuxostat 80 MG TABS Take 1 tablet (80 mg total) by mouth daily.  . [DISCONTINUED] tiZANidine (ZANAFLEX) 4 MG capsule Take 1 capsule (4 mg total) by mouth 3 (three) times daily.  . celecoxib (CELEBREX) 100 MG capsule Take 1 capsule (100 mg total) by mouth 2 (two) times daily. For gout pain  . sertraline (ZOLOFT) 100 MG tablet Take 1 tablet (100 mg total) by mouth daily.   Facility-Administered Encounter Medications as of 12/15/2016  Medication  . diclofenac sodium (VOLTAREN) 1 % transdermal gel 4 g  . midazolam (VERSED) 5 MG/5ML injection 5 mg  . orphenadrine (NORFLEX) injection 60 mg  . sodium chloride 0.9 % injection 20 mL  . triamcinolone acetonide (KENALOG-40) injection 40 mg    Allergies (verified) Cephalexin; Penicillin g; Penicillins; Atorvastatin; Cymbalta [duloxetine hcl]; Duloxetine; Fenofibrate; Statins; and Latex   History: Past Medical History:  Diagnosis Date  . Arthritis   . Cardiac arrhythmia due to congenital heart disease   . Chicken pox   . Depression   . Headache, frequent episodic tension-type   . High cholesterol   . History of high blood pressure    readings  . Holter monitor, abnormal August 2012   done for long QT.  some contractile asynchrony [name])  . Hx of colonoscopy sept 2012   normal,  next due 2022, [name]  . Hypertension   . Migraines   .  Morton's neuroma    bilateral  . Syncope    Past Surgical History:  Procedure Laterality Date  . ABDOMINAL HYSTERECTOMY  Dec 2013   [place]  . APPENDECTOMY  1973  . BI-VENTRICULAR PACEMAKER INSERTION (CRT-P)  05/2013   MDT CRTP implanted by Dr Lovena Le for cardiomyopathy, LBBB, and syncope  . HYSTEROSCOPY  2005   heavy bleeding  . IMPLANTABLE CARDIOVERTER DEFIBRILLATOR IMPLANT N/A 06/02/2013   Procedure: IMPLANTABLE CARDIOVERTER DEFIBRILLATOR IMPLANT;  Surgeon: Evans Lance, MD;  [facility] C[facility] LAB;  Service: Cardiovascular;  Laterality: N/A;  . SPINAL FUSION  03/2007   ruptured disc  L5  Max  [place]  . TONSILLECTOMY AND ADENOIDECTOMY  1966   Family History  Problem Relation Age of Onset  . Arthritis Mother   . Hyperlipidemia Mother   . Cirrhosis Mother   . Arthritis Father   . Cancer Father        prostate cancer   . Hyperlipidemia Father   . Pulmonary embolism Father 28  . Alzheimer's disease Father   . Cancer Other        ovarian,uterus  . Heart disease Other   . Stroke Other   . Learning disabilities Other   . Coronary artery disease Paternal Grandfather 76  . Diabetes Brother   . Diabetes Maternal Uncle    Social History   Occupational History  . Not on file.   Social History Main Topics  . Smoking status: Never Smoker  . Smokeless tobacco: Never Used  . Alcohol use No  . Drug use: No  . Sexual activity: Yes    Tobacco Counseling Counseling given: Not Answered   Activities of Daily Living In your present state of health, do you have any difficulty performing the following activities: 12/15/2016  Hearing? N  Vision? N  Difficulty concentrating or making decisions? N  Walking or climbing stairs? Y  Dressing or bathing? N  Doing errands, shopping? N  Preparing Food and eating ? N  Using the Toilet? N  In the past six months, have you accidently leaked urine? Y  Comment Managed with a daily pad.  Followed by Urologist McDermid and PCP.  Do you have problems with loss of bowel control? Y  Comment Loose stools, followed by PCP.  Managing your Medications? N  Managing your Finances? N  Housekeeping or managing your Housekeeping? Y  Comment Husband assists  Some recent data might be hidden    Immunizations and Health Maintenance Immunization History  Administered Date(s) Administered  . Influenza Split 03/28/2014  . Influenza,inj,Quad PF,6+ Mos 02/07/2013, 04/11/2015, 03/06/2016  . Pneumococcal Conjugate-13 06/28/2014  . Pneumococcal Polysaccharide-23 05/11/2013  . Tdap 02/08/2008   Health Maintenance Due  Topic Date Due  . MAMMOGRAM   12/21/2015  . OPHTHALMOLOGY EXAM  09/05/2016  . FOOT EXAM  12/04/2016    Patient Care Team: Crecencio Mc, MD as PCP - General (Internal Medicine)  Indicate any recent Medical Services you may have received from other than [facility] providers in the past year (date may be approximate).     Assessment:   This is a routine wellness examination for Julia Pierce. The goal of the wellness visit is to assist the patient how to close the gaps in care and create a preventative care plan for the patient.   The roster of all physicians providing medical care to patient is listed in the Snapshot section of the chart.  Taking calcium VIT D as appropriate/Osteoporosis risk reviewed.    Safety  issues reviewed; Smoke and carbon monoxide detectors in the home. No firearms in the home.  Wears seatbelts when driving or riding with others. Patient does wear sunscreen or protective clothing when in direct sunlight. No violence in the home.  Patient is alert, normal appearance, oriented to person/place/and time.  Correctly identified the president of the [place], recall of 3/3 words, and performing simple calculations. Displays appropriate judgement and can read correct time from watch face.   No new identified risk were noted.  No failures at ADL's or IADL's.    BMI- discussed the importance of a healthy diet, water intake and the benefits of aerobic exercise. Educational material provided.   24 hour diet recall: Breakfast: biscuit, egg Lunch: fruit Dinner: [place] sandwich   Daily fluid intake: 2 cups of caffeine, 6 cups of water  Dental- every 6 months.  Dr. Benito Mccreedy.  Sleep patterns- Sleeps 7-8 hours at night.  Wakes feeling rested.  Mammogram; she reports having this completed 3 months ago and will bring to office.  Patient Concerns: None at this time. Follow up with PCP as needed.  Hearing/Vision screen Hearing Screening Comments: Patient is able to hear conversational tones without difficulty.   No issues reported.   Vision Screening Comments: Followed by [facility]) Wears corrective lenses Last OV 2017 Visual acuity not assessed per patient preference since they have regular follow up with the ophthalmologist Upcoming appointment scheduled for September2018  Dietary issues and exercise activities discussed: Current Exercise Habits: Home exercise routine, Time (Minutes): 45, Frequency (Times/Week): 3, Weekly Exercise (Minutes/Week): 135, Intensity: Moderate  Goals    . Increase physical activity          Use exercise bike 3 times a week Maintain swimming regimen      Depression Screen PHQ 2/9 Scores 12/15/2016 11/27/2015 09/20/2015 08/21/2015 06/14/2015 05/17/2015 04/19/2015  PHQ - 2 Score 0 0 0 0 0 0 0  PHQ- 9 Score 0 - - - - - -  Exception Documentation - - - - - - Patient refusal    Fall Risk Fall Risk  12/15/2016 12/31/2015 11/27/2015 10/26/2015 09/20/2015  Falls in the past year? No No No No No  Comment - - - - -  Number falls in past yr: - - - - -  Injury with Fall? - - - - -  Risk for fall due to : - - - - -  Risk for fall due to: Comment - - - - -  Follow up - - - - -    Cognitive Function: MMSE - Mini Mental State Exam 12/15/2016  Orientation to time 5  Orientation to Place 5  Registration 3  Attention/ Calculation 5  Recall 3  Language- name 2 objects 2  Language- repeat 1  Language- follow 3 step command 3  Language- read & follow direction 1  Write a sentence 1  Copy design 1  Total score 30        Screening Tests Health Maintenance  Topic Date Due  . MAMMOGRAM  12/21/2015  . OPHTHALMOLOGY EXAM  09/05/2016  . FOOT EXAM  12/04/2016  . INFLUENZA VACCINE  02/18/2017 (Originally 11/19/2016)  . HEMOGLOBIN A1C  06/14/2017  . TETANUS/TDAP  02/07/2018  . PNEUMOCOCCAL POLYSACCHARIDE VACCINE (2) 05/11/2018  . COLONOSCOPY  01/08/2021  . Hepatitis C Screening  Completed  . HIV Screening  Completed  . PAP SMEAR  Excluded      Plan:   End of  life planning; Advanced  aging; Advanced directives discussed.  No HCPOA/Living Will.  Additional information declined at this time.  Emmi education assigned via portal:diabetic nutrition  I have personally reviewed and noted the following in the patient's chart:   . Medical and social history . Use of alcohol, tobacco or illicit drugs  . Current medications and supplements . Functional ability and status . Nutritional status . Physical activity . Advanced directives . List of other physicians . Hospitalizations, surgeries, and ER visits in previous 12 months . Vitals . Screenings to include cognitive, depression, and falls . Referrals and appointments  In addition, I have reviewed and discussed with patient certain preventive protocols, quality metrics, and best practice recommendations. A written personalized care plan for preventive services as well as general preventive health recommendations were provided to patient.     OBrien-Blaney, Denisa L, LPN   06/09/4710     I have reviewed the above information and agree with above.   Deborra Medina, MD

## 2016-12-15 NOTE — Patient Instructions (Addendum)
  Ms. Dhingra , Thank you for taking time to come for your Medicare Wellness Visit. I appreciate your ongoing commitment to your health goals. Please review the following plan we discussed and let me know if I can assist you in the future.   Follow up with Dr. Derrel Nip as needed.    Emmi education assigned via portal: diabetic nutrition  Have a great day!  These are the goals we discussed: Goals    . Increase physical activity          Use exercise bike 3 times a week Maintain swimming regimen       This is a list of the screening recommended for you and due dates:  Health Maintenance  Topic Date Due  . Mammogram  12/21/2015  . Eye exam for diabetics  09/05/2016  . Complete foot exam   12/04/2016  . Flu Shot  02/18/2017*  . Hemoglobin A1C  06/14/2017  . Tetanus Vaccine  02/07/2018  . Pneumococcal vaccine (2) 05/11/2018  . Colon Cancer Screening  01/08/2021  .  Hepatitis C: One time screening is recommended by Center for Disease Control  (CDC) for  adults born from [birth year] through 1966.   Completed  . HIV Screening  Completed  . Pap Smear  Excluded  *Topic was postponed. The date shown is not the original due date.

## 2016-12-15 NOTE — Patient Instructions (Addendum)
Your diabetes remains under excellent control  And your cholesterol and other labs are also normal (except for your uric acid). . Please continue your current medications. return in 3 months for follow up on diabetes and make sure you are seeing your eye doctor at least once a year.   I have ordered a CT of your cervical spine (neck) to rule out a pinched nerve.   I recommend trying a probiotic ( Align, Floraque. or Culturelle), the generic version of one of these over the counter medications, or an alternative form (kombucha,  Kevita , Yogurt, or another dietary source) for management of  diarrhea . Taking a probiotic may also prevent vaginitis due to yeast infections and can be continued indefinitely if you feel that it improves your digestion or your elimination (bowels).     Probiotics What are probiotics? Probiotics are the good bacteria and yeasts that live in your body and keep you and your digestive system healthy. Probiotics also help your body's defense (immune) system and protect your body against bad bacterial growth. Certain foods contain probiotics, such as yogurt. Probiotics can also be purchased as a supplement. As with any supplement or drug, it is important to discuss its use with your health care provider. What affects the balance of bacteria in my body? The balance of bacteria in your body can be affected by:  Antibiotic medicines. Antibiotics are sometimes necessary to treat infection. Unfortunately, they may kill good or friendly bacteria in your body as well as the bad bacteria. This may lead to stomach problems like diarrhea, gas, and cramping.  Disease. Some conditions are the result of an overgrowth of bad bacteria, yeasts, parasites, or fungi. These conditions include: ? Infectious diarrhea. ? Stomach and respiratory infections. ? Skin infections. ? Irritable bowel syndrome (IBS). ? Inflammatory bowel diseases. ? Ulcer due to Helicobacter pylori (H. pylori)  infection. ? Tooth decay and periodontal disease. ? Vaginal infections.  Stress and poor diet may also lower the good bacteria in your body. What type of probiotic is right for me? Probiotics are available over the counter at your local pharmacy, health food, or grocery store. They come in many different forms, combinations of strains, and dosing strengths. Some may need to be refrigerated. Always read the label for storage and usage instructions. Specific strains have been shown to be more effective for certain conditions. Ask your health care provider what option is best for you. Why would I need probiotics? There are many reasons your health care provider might recommend a probiotic supplement, including:  Diarrhea.  Constipation.  IBS.  Respiratory infections.  Yeast infections.  Acne, eczema, and other skin conditions.  Frequent urinary tract infections (UTIs).  Are there side effects of probiotics? Some people experience mild side effects when taking probiotics. Side effects are usually temporary and may include:  Gas.  Bloating.  Cramping.  Rarely, serious side effects, such as infection or immune system changes, may occur. What else do I need to know about probiotics?  There are many different strains of probiotics. Certain strains may be more effective depending on your condition. Probiotics are available in varying doses. Ask your health care provider which probiotic you should use and how often.  If you are taking probiotics along with antibiotics, it is generally recommended to wait at least 2 hours between taking the antibiotic and taking the probiotic. For more information: [facility] [email or web address] This information is not intended to replace advice given  to you by your health care provider. Make sure you discuss any questions you have with your health care provider. Document Released: 11/02/2013 Document  Revised: 03/04/2016 Document Reviewed: 07/05/2013 Elsevier Interactive Patient Education  2017 [employer].

## 2016-12-16 NOTE — Assessment & Plan Note (Signed)
Well controlled onetoprolol and  mlower dose of losartan . Renal function stable, no changes today.  Lab Results  Component Value Date   CREATININE 0.73 12/12/2016   Lab Results  Component Value Date   NA 142 12/12/2016   K 4.5 12/12/2016   CL 103 12/12/2016   CO2 31 12/12/2016

## 2016-12-16 NOTE — Assessment & Plan Note (Signed)
She has intolerance to allopurinol and uloric.  Her last flare was 3 months ago and resolved with prednisone.  Lab Results  Component Value Date   LABURIC 7.5 (H) 12/12/2016

## 2016-12-16 NOTE — Assessment & Plan Note (Signed)
Recent escalation of symptoms with numbness involving the of right hand.  CT spine ordered to rule out nerve root compression  Of C8

## 2016-12-16 NOTE — Assessment & Plan Note (Signed)
Controlled,  a1c at goal without medications.    No medications at this time. Continue ARB.  Statin intolerant  Lab Results  Component Value Date   HGBA1C 6.9 (H) 12/12/2016   Lab Results  Component Value Date   MICROALBUR 0.7 06/09/2016

## 2016-12-16 NOTE — Assessment & Plan Note (Signed)
I have addressed  BMI and recommended a low glycemic index diet utilizing smaller more frequent meals to increase metabolism.  I have also recommended that patient resume  exercising with a goal of 30 minutes of aerobic exercise a minimum of 5 days per week. y.

## 2016-12-17 ENCOUNTER — Ambulatory Visit: Payer: BLUE CROSS/BLUE SHIELD | Admitting: Internal Medicine

## 2016-12-18 ENCOUNTER — Ambulatory Visit (INDEPENDENT_AMBULATORY_CARE_PROVIDER_SITE_OTHER): Payer: BLUE CROSS/BLUE SHIELD | Admitting: *Deleted

## 2016-12-18 DIAGNOSIS — I428 Other cardiomyopathies: Secondary | ICD-10-CM | POA: Diagnosis not present

## 2016-12-18 DIAGNOSIS — L718 Other rosacea: Secondary | ICD-10-CM | POA: Diagnosis not present

## 2016-12-18 DIAGNOSIS — Z86018 Personal history of other benign neoplasm: Secondary | ICD-10-CM | POA: Diagnosis not present

## 2016-12-18 DIAGNOSIS — L578 Other skin changes due to chronic exposure to nonionizing radiation: Secondary | ICD-10-CM | POA: Diagnosis not present

## 2016-12-18 DIAGNOSIS — Z872 Personal history of diseases of the skin and subcutaneous tissue: Secondary | ICD-10-CM | POA: Diagnosis not present

## 2016-12-18 NOTE — Progress Notes (Signed)
Remote pacemaker transmission.   

## 2016-12-29 ENCOUNTER — Encounter: Payer: Self-pay | Admitting: Internal Medicine

## 2016-12-29 ENCOUNTER — Ambulatory Visit: Payer: BLUE CROSS/BLUE SHIELD

## 2016-12-30 ENCOUNTER — Encounter: Payer: Self-pay | Admitting: Cardiology

## 2016-12-31 ENCOUNTER — Ambulatory Visit
Admission: RE | Admit: 2016-12-31 | Discharge: 2016-12-31 | Disposition: A | Discharge status: Home / Self Care | Payer: BLUE CROSS/BLUE SHIELD | Source: Ambulatory Visit | Attending: Internal Medicine | Admitting: Internal Medicine

## 2016-12-31 DIAGNOSIS — M4802 Spinal stenosis, cervical region: Secondary | ICD-10-CM | POA: Diagnosis not present

## 2016-12-31 DIAGNOSIS — M4722 Other spondylosis with radiculopathy, cervical region: Secondary | ICD-10-CM

## 2017-01-01 ENCOUNTER — Encounter: Payer: Self-pay | Admitting: Internal Medicine

## 2017-01-01 DIAGNOSIS — M4802 Spinal stenosis, cervical region: Secondary | ICD-10-CM

## 2017-01-06 LAB — CUP PACEART REMOTE DEVICE CHECK
Battery Remaining Longevity: 54 mo
Battery Voltage: 3 V
Brady Statistic AP VP Percent: 0.55 %
Brady Statistic AP VS Percent: 0.07 %
Brady Statistic AS VP Percent: 99.15 %
Brady Statistic AS VS Percent: 0.23 %
Brady Statistic RA Percent Paced: 0.63 %
Brady Statistic RV Percent Paced: 99.57 %
Date Time Interrogation Session: 20180830171837
Implantable Lead Implant Date: 20150212
Implantable Lead Implant Date: 20150212
Implantable Lead Implant Date: 20150212
Implantable Lead Location: 753858
Implantable Lead Location: 753859
Implantable Lead Location: 753860
Implantable Lead Model: 4194
Implantable Lead Model: 5076
Implantable Lead Model: 5076
Implantable Pulse Generator Implant Date: 20150212
Lead Channel Impedance Value: 380 Ohm
Lead Channel Impedance Value: 380 Ohm
Lead Channel Impedance Value: 380 Ohm
Lead Channel Impedance Value: 418 Ohm
Lead Channel Impedance Value: 437 Ohm
Lead Channel Impedance Value: 475 Ohm
Lead Channel Impedance Value: 475 Ohm
Lead Channel Impedance Value: 532 Ohm
Lead Channel Impedance Value: 589 Ohm
Lead Channel Pacing Threshold Amplitude: 0.5 V
Lead Channel Pacing Threshold Amplitude: 0.75 V
Lead Channel Pacing Threshold Amplitude: 1.875 V
Lead Channel Pacing Threshold Pulse Width: 0.4 ms
Lead Channel Pacing Threshold Pulse Width: 0.4 ms
Lead Channel Pacing Threshold Pulse Width: 0.4 ms
Lead Channel Sensing Intrinsic Amplitude: 1.375 mV
Lead Channel Sensing Intrinsic Amplitude: 1.375 mV
Lead Channel Sensing Intrinsic Amplitude: 13.25 mV
Lead Channel Sensing Intrinsic Amplitude: 13.25 mV
Lead Channel Setting Pacing Amplitude: 1.5 V
Lead Channel Setting Pacing Amplitude: 2 V
Lead Channel Setting Pacing Amplitude: 2.5 V
Lead Channel Setting Pacing Pulse Width: 0.4 ms
Lead Channel Setting Pacing Pulse Width: 0.8 ms
Lead Channel Setting Sensing Sensitivity: 0.9 mV

## 2017-01-12 ENCOUNTER — Telehealth: Payer: Self-pay | Admitting: Internal Medicine

## 2017-01-12 NOTE — Telephone Encounter (Signed)
Documented under quality metrics patient is statin intolerant.

## 2017-01-14 NOTE — Telephone Encounter (Signed)
Error

## 2017-01-15 ENCOUNTER — Telehealth: Payer: Self-pay | Admitting: Internal Medicine

## 2017-01-15 NOTE — Telephone Encounter (Signed)
error 

## 2017-01-22 ENCOUNTER — Encounter: Payer: Self-pay | Admitting: Internal Medicine

## 2017-01-23 ENCOUNTER — Other Ambulatory Visit: Payer: Self-pay | Admitting: Internal Medicine

## 2017-01-23 DIAGNOSIS — G5603 Carpal tunnel syndrome, bilateral upper limbs: Secondary | ICD-10-CM | POA: Diagnosis not present

## 2017-01-23 DIAGNOSIS — I10 Essential (primary) hypertension: Secondary | ICD-10-CM | POA: Diagnosis not present

## 2017-01-23 MED ORDER — CYCLOBENZAPRINE HCL 5 MG PO TABS: 5.0000 mg | ORAL_TABLET | Freq: Three times a day (TID) | ORAL | 2 refills | Status: DC | PRN

## 2017-01-23 NOTE — Progress Notes (Unsigned)
fexeril

## 2017-02-09 DIAGNOSIS — G5603 Carpal tunnel syndrome, bilateral upper limbs: Secondary | ICD-10-CM | POA: Diagnosis not present

## 2017-02-16 ENCOUNTER — Other Ambulatory Visit: Payer: Self-pay | Admitting: Internal Medicine

## 2017-02-20 DIAGNOSIS — G5603 Carpal tunnel syndrome, bilateral upper limbs: Secondary | ICD-10-CM | POA: Diagnosis not present

## 2017-03-16 ENCOUNTER — Encounter: Payer: Self-pay | Admitting: Internal Medicine

## 2017-03-16 ENCOUNTER — Other Ambulatory Visit (INDEPENDENT_AMBULATORY_CARE_PROVIDER_SITE_OTHER): Payer: BLUE CROSS/BLUE SHIELD

## 2017-03-16 DIAGNOSIS — E119 Type 2 diabetes mellitus without complications: Secondary | ICD-10-CM | POA: Diagnosis not present

## 2017-03-16 DIAGNOSIS — E785 Hyperlipidemia, unspecified: Secondary | ICD-10-CM | POA: Diagnosis not present

## 2017-03-16 LAB — COMPREHENSIVE METABOLIC PANEL
ALK PHOS: 73 U/L (ref 39–117)
ALT: 14 U/L (ref 0–35)
AST: 18 U/L (ref 0–37)
Albumin: 4.3 g/dL (ref 3.5–5.2)
BILIRUBIN TOTAL: 0.5 mg/dL (ref 0.2–1.2)
BUN: 15 mg/dL (ref 6–23)
CO2: 28 mEq/L (ref 19–32)
CREATININE: 0.71 mg/dL (ref 0.40–1.20)
Calcium: 9.5 mg/dL (ref 8.4–10.5)
Chloride: 105 mEq/L (ref 96–112)
GFR: 88.47 mL/min (ref >60.00)
GLUCOSE: 144 mg/dL — AB (ref 70–99)
Potassium: 4.1 mEq/L (ref 3.5–5.1)
SODIUM: 140 meq/L (ref 135–145)
TOTAL PROTEIN: 7.1 g/dL (ref 6.0–8.3)

## 2017-03-16 LAB — LIPID PANEL
Cholesterol: 233 mg/dL — ABNORMAL HIGH (ref 0–200)
HDL: 34.9 mg/dL — ABNORMAL LOW (ref >39.00)
NONHDL: 198.34
TRIGLYCERIDES: 310 mg/dL — AB (ref 0.0–149.0)
Total CHOL/HDL Ratio: 7
VLDL: 62 mg/dL — ABNORMAL HIGH (ref 0.0–40.0)

## 2017-03-16 LAB — LDL CHOLESTEROL, DIRECT: Direct LDL: 149 mg/dL

## 2017-03-16 LAB — HEMOGLOBIN A1C: Hgb A1c MFr Bld: 6.9 % — ABNORMAL HIGH (ref 4.6–6.5)

## 2017-03-18 ENCOUNTER — Telehealth: Payer: Self-pay | Admitting: Internal Medicine

## 2017-03-18 ENCOUNTER — Encounter: Payer: Self-pay | Admitting: Internal Medicine

## 2017-03-18 ENCOUNTER — Ambulatory Visit (INDEPENDENT_AMBULATORY_CARE_PROVIDER_SITE_OTHER): Payer: BLUE CROSS/BLUE SHIELD | Admitting: Internal Medicine

## 2017-03-18 DIAGNOSIS — E669 Obesity, unspecified: Secondary | ICD-10-CM | POA: Diagnosis not present

## 2017-03-18 DIAGNOSIS — G894 Chronic pain syndrome: Secondary | ICD-10-CM

## 2017-03-18 DIAGNOSIS — E785 Hyperlipidemia, unspecified: Secondary | ICD-10-CM | POA: Diagnosis not present

## 2017-03-18 DIAGNOSIS — E119 Type 2 diabetes mellitus without complications: Secondary | ICD-10-CM

## 2017-03-18 DIAGNOSIS — Z23 Encounter for immunization: Secondary | ICD-10-CM | POA: Diagnosis not present

## 2017-03-18 MED ORDER — BACLOFEN 10 MG PO TABS: 10.0000 mg | ORAL_TABLET | Freq: Three times a day (TID) | ORAL | 3 refills | Status: DC

## 2017-03-18 MED ORDER — ZOSTER VAC RECOMB ADJUVANTED 50 MCG/0.5ML IM SUSR: 0.5000 mL | Freq: Once | INTRAMUSCULAR | 1 refills | Status: AC

## 2017-03-18 NOTE — Telephone Encounter (Signed)
Pt is scheduled for fasting labs on 07/02/2017. Order is needed please and thank you!

## 2017-03-18 NOTE — Telephone Encounter (Signed)
Labs have been ordered
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
## 2017-03-18 NOTE — Patient Instructions (Addendum)
I have changed your muscle relaxer to baclofen to see if the side effects are more tolerable   Your diabetes remains under excellent control  And your cholesterol and other labs are also normal or improved. Please continue your current medications, . return in3 to  6 months for follow up on diabetes and make sure you are seeing your eye doctor at least once a year.    The ShingRx vaccine is now available in local pharmacies and is much more protective thant Zostavaxs,  It is therefore ADVISED for all interested adults over 99 to prevent shingles    Diabetes Mellitus and Exercise Exercising regularly is important for your overall health, especially when you have diabetes (diabetes mellitus). Exercising is not only about losing weight. It has many health benefits, such as increasing muscle strength and bone density and reducing body fat and stress. This leads to improved fitness, flexibility, and endurance, all of which result in better overall health. Exercise has additional benefits for people with diabetes, including:  Reducing appetite.  Helping to lower and control blood glucose.  Lowering blood pressure.  Helping to control amounts of fatty substances (lipids) in the blood, such as cholesterol and triglycerides.  Helping the body to respond better to insulin (improving insulin sensitivity).  Reducing how much insulin the body needs.  Decreasing the risk for heart disease by: ? Lowering cholesterol and triglyceride levels. ? Increasing the levels of good cholesterol. ? Lowering blood glucose levels.  What is my activity plan? Your health care provider or certified diabetes educator can help you make a plan for the type and frequency of exercise (activity plan) that works for you. Make sure that you:  Do at least 150 minutes of moderate-intensity or vigorous-intensity exercise each week. This could be brisk walking, biking, or water aerobics. ? Do stretching and strength exercises,  such as yoga or weightlifting, at least 2 times a week. ? Spread out your activity over at least 3 days of the week.  Get some form of physical activity every day. ? Do not go more than 2 days in a row without some kind of physical activity. ? Avoid being inactive for more than 90 minutes at a time. Take frequent breaks to walk or stretch.  Choose a type of exercise or activity that you enjoy, and set realistic goals.  Start slowly, and gradually increase the intensity of your exercise over time.  What do I need to know about managing my diabetes?  Check your blood glucose before and after exercising. ? If your blood glucose is higher than 240 mg/dL (13.3 mmol/L) before you exercise, check your urine for ketones. If you have ketones in your urine, do not exercise until your blood glucose returns to normal.  Know the symptoms of low blood glucose (hypoglycemia) and how to treat it. Your risk for hypoglycemia increases during and after exercise. Common symptoms of hypoglycemia can include: ? Hunger. ? Anxiety. ? Sweating and feeling clammy. ? Confusion. ? Dizziness or feeling light-headed. ? Increased heart rate or palpitations. ? Blurry vision. ? Tingling or numbness around the mouth, lips, or tongue. ? Tremors or shakes. ? Irritability.  Keep a rapid-acting carbohydrate snack available before, during, and after exercise to help prevent or treat hypoglycemia.  Avoid injecting insulin into areas of the body that are going to be exercised. For example, avoid injecting insulin into: ? The arms, when playing tennis. ? The legs, when jogging.  Keep records of your exercise habits. Doing  this can help you and your health care provider adjust your diabetes management plan as needed. Write down: ? Food that you eat before and after you exercise. ? Blood glucose levels before and after you exercise. ? The type and amount of exercise you have done. ? When your insulin is expected to peak,  if you use insulin. Avoid exercising at times when your insulin is peaking.  When you start a new exercise or activity, work with your health care provider to make sure the activity is safe for you, and to adjust your insulin, medicines, or food intake as needed.  Drink plenty of water while you exercise to prevent dehydration or heat stroke. Drink enough fluid to keep your urine clear or pale yellow. This information is not intended to replace advice given to you by your health care provider. Make sure you discuss any questions you have with your health care provider. Document Released: 06/28/2003 Document Revised: 10/26/2015 Document Reviewed: 09/17/2015 Elsevier Interactive Patient Education  2018 [employer].

## 2017-03-19 ENCOUNTER — Ambulatory Visit (INDEPENDENT_AMBULATORY_CARE_PROVIDER_SITE_OTHER): Payer: BLUE CROSS/BLUE SHIELD | Admitting: *Deleted

## 2017-03-19 DIAGNOSIS — I428 Other cardiomyopathies: Secondary | ICD-10-CM | POA: Diagnosis not present

## 2017-03-19 NOTE — Progress Notes (Signed)
Remote pacemaker transmission.   

## 2017-03-20 ENCOUNTER — Encounter: Payer: Self-pay | Admitting: Cardiology

## 2017-03-21 NOTE — Assessment & Plan Note (Signed)
I have addressed  BMI and recommended a low glycemic index diet utilizing smaller more frequent meals to increase metabolism.  I have also recommended that patient resume exercising with a goal of 30 minutes of aerobic exercise a minimum of 5 days per week. 

## 2017-03-21 NOTE — Assessment & Plan Note (Signed)
LDL is at goal, but her triglycerides have increased   She has intolerance to fenofibrate and statins. Resume exercise, .  Low GI diet and continue  [facility] [place] Lab Results  Component Value Date   CHOL 233 (H) 03/16/2017   HDL 34.90 (L) 03/16/2017   LDLCALC 88 08/17/2013   LDLDIRECT 149.0 03/16/2017   TRIG 310.0 (H) 03/16/2017   CHOLHDL 7 03/16/2017   Lab Results  Component Value Date   ALT 14 03/16/2017   AST 18 03/16/2017   ALKPHOS 73 03/16/2017   BILITOT 0.5 03/16/2017

## 2017-03-21 NOTE — Assessment & Plan Note (Addendum)
Change in muscle relaxer to baclofen

## 2017-03-21 NOTE — Assessment & Plan Note (Signed)
Controlled,  a1c at goal without medications.    No medications at this time. Continue ARB.  Statin intolerant  Lab Results  Component Value Date   HGBA1C 6.9 (H) 03/16/2017   Lab Results  Component Value Date   MICROALBUR 0.7 06/09/2016

## 2017-03-23 LAB — CUP PACEART REMOTE DEVICE CHECK
Battery Remaining Longevity: 48 mo
Battery Voltage: 2.99 V
Brady Statistic AS VS Percent: 0.31 %
Date Time Interrogation Session: 20181129210924
Implantable Lead Implant Date: 20150212
Implantable Lead Location: 753859
Implantable Lead Location: 753860
Implantable Lead Model: 5076
Implantable Pulse Generator Implant Date: 20150212
Lead Channel Impedance Value: 494 Ohm
Lead Channel Pacing Threshold Amplitude: 0.625 V
Lead Channel Pacing Threshold Amplitude: 1.875 V
Lead Channel Pacing Threshold Pulse Width: 0.4 ms
Lead Channel Pacing Threshold Pulse Width: 0.4 ms
Lead Channel Sensing Intrinsic Amplitude: 1.5 mV
Lead Channel Sensing Intrinsic Amplitude: 1.5 mV
Lead Channel Sensing Intrinsic Amplitude: 19.625 mV
Lead Channel Setting Pacing Amplitude: 2 V
Lead Channel Setting Pacing Pulse Width: 0.4 ms
Lead Channel Setting Sensing Sensitivity: 0.9 mV
MDC IDC LEAD IMPLANT DT: 20150212
MDC IDC LEAD IMPLANT DT: 20150212
MDC IDC LEAD LOCATION: 753858
MDC IDC MSMT LEADCHNL LV IMPEDANCE VALUE: 380 Ohm
MDC IDC MSMT LEADCHNL LV IMPEDANCE VALUE: 456 Ohm
MDC IDC MSMT LEADCHNL LV IMPEDANCE VALUE: 475 Ohm
MDC IDC MSMT LEADCHNL LV IMPEDANCE VALUE: 551 Ohm
MDC IDC MSMT LEADCHNL LV IMPEDANCE VALUE: 589 Ohm
MDC IDC MSMT LEADCHNL LV PACING THRESHOLD PULSEWIDTH: 0.4 ms
MDC IDC MSMT LEADCHNL RA IMPEDANCE VALUE: 361 Ohm
MDC IDC MSMT LEADCHNL RA IMPEDANCE VALUE: 380 Ohm
MDC IDC MSMT LEADCHNL RV IMPEDANCE VALUE: 437 Ohm
MDC IDC MSMT LEADCHNL RV PACING THRESHOLD AMPLITUDE: 0.75 V
MDC IDC MSMT LEADCHNL RV SENSING INTR AMPL: 19.625 mV
MDC IDC SET LEADCHNL LV PACING AMPLITUDE: 2.5 V
MDC IDC SET LEADCHNL LV PACING PULSEWIDTH: 0.8 ms
MDC IDC SET LEADCHNL RA PACING AMPLITUDE: 1.5 V
MDC IDC STAT BRADY AP VP PERCENT: 0.13 %
MDC IDC STAT BRADY AP VS PERCENT: 0.02 %
MDC IDC STAT BRADY AS VP PERCENT: 99.54 %
MDC IDC STAT BRADY RA PERCENT PACED: 0.15 %
MDC IDC STAT BRADY RV PERCENT PACED: 99.59 %

## 2017-03-31 DIAGNOSIS — I420 Dilated cardiomyopathy: Secondary | ICD-10-CM | POA: Diagnosis not present

## 2017-03-31 DIAGNOSIS — E782 Mixed hyperlipidemia: Secondary | ICD-10-CM | POA: Diagnosis not present

## 2017-03-31 DIAGNOSIS — I10 Essential (primary) hypertension: Secondary | ICD-10-CM | POA: Diagnosis not present

## 2017-04-01 ENCOUNTER — Ambulatory Visit (INDEPENDENT_AMBULATORY_CARE_PROVIDER_SITE_OTHER): Payer: BLUE CROSS/BLUE SHIELD | Admitting: Internal Medicine

## 2017-04-01 ENCOUNTER — Encounter: Payer: Self-pay | Admitting: Internal Medicine

## 2017-04-01 VITALS — BP 124/80 | HR 80 | Ht 62.5 in | Wt 167.8 lb

## 2017-04-01 DIAGNOSIS — I428 Other cardiomyopathies: Secondary | ICD-10-CM | POA: Diagnosis not present

## 2017-04-01 DIAGNOSIS — Z950 Presence of cardiac pacemaker: Secondary | ICD-10-CM

## 2017-04-01 DIAGNOSIS — I5022 Chronic systolic (congestive) heart failure: Secondary | ICD-10-CM

## 2017-04-01 NOTE — Progress Notes (Signed)
HPI Mrs. Goto returns today for ongoing evaluation and management of her biventricular pacemaker and chronic systolic heart failure.  In the interim she has been stable.  Her weight is unchanged.  She denies syncope, chest pain, or shortness of breath.  She admits to being a little more sedentary.  She is frustrated by her inability to lose any weight. Allergies  Allergen Reactions  . Cephalexin Anaphylaxis  . Penicillin G Hives, Shortness Of Breath and Swelling  . Penicillins Hives, Shortness Of Breath and Swelling  . Atorvastatin     Other reaction(s): Muscle Pain myalgias myalgias  . Cymbalta [Duloxetine Hcl] Other (See Comments)  . Duloxetine     Other reaction(s): Muscle Pain Other reaction(s): Muscle Pain  . Fenofibrate     Muscle pain   . Statins     Severe myalgias  . Latex Rash     Current Outpatient Medications  Medication Sig Dispense Refill  . aspirin EC 81 MG tablet Take 81 mg by mouth daily.    . Azelaic Acid (FINACEA EX) Apply 1 application topically 2 (two) times daily.    . B Complex Vitamins (B COMPLEX PO) Take 1 tablet by mouth daily.     . baclofen (LIORESAL) 10 MG tablet Take 10 mg by mouth 2 (two) times daily.    . calcium elemental as carbonate (BARIATRIC TUMS ULTRA) 400 MG chewable tablet Chew by mouth.    . Cholecalciferol (VITAMIN D3) 2000 UNITS TABS Take 1 tablet by mouth daily.    Marland Kitchen esomeprazole (NEXIUM) 20 MG capsule Take 20 mg by mouth daily at 12 noon.     . fluticasone (FLONASE) 50 MCG/ACT nasal spray Place 2 sprays into both nostrils daily.    . furosemide (LASIX) 20 MG tablet Take 1 tablet (20 mg total) by mouth daily. As needed for fluid retention 30 tablet 0  . losartan-hydrochlorothiazide (HYZAAR) 50-12.5 MG tablet TAKE 1 TABLET BY MOUTH EVERY DAY 90 tablet 1  . methocarbamol (ROBAXIN) 500 MG tablet LIMIT 1 TABLET BY MOUTH PER DAY OR 2-3 TIMES PER DAY AS NEEDED 90 tablet 5  . metoprolol tartrate (LOPRESSOR) 25 MG tablet Take 12.5 mg  by mouth daily. Reported on 05/17/2015    . Multiple Vitamin (MULTIVITAMIN) tablet Take 1 tablet by mouth daily.      . sertraline (ZOLOFT) 100 MG tablet TAKE 1 TABLET BY MOUTH EVERY DAY 90 tablet 1  . spironolactone (ALDACTONE) 25 MG tablet Take 25 mg by mouth daily.    Marland Kitchen tolterodine (DETROL) 2 MG tablet TAKE 1 TABLET TWICE A DAY 180 tablet 4   Current Facility-Administered Medications  Medication Dose Route Frequency Provider Last Rate Last Dose  . diclofenac sodium (VOLTAREN) 1 % transdermal gel 4 g  4 g Topical QID Mohammed Kindle, MD      . midazolam (VERSED) 5 MG/5ML injection 5 mg  5 mg Intravenous Once Mohammed Kindle, MD      . orphenadrine (NORFLEX) injection 60 mg  60 mg Intramuscular Once Mohammed Kindle, MD      . sodium chloride 0.9 % injection 20 mL  20 mL Other Once Mohammed Kindle, MD      . triamcinolone acetonide (KENALOG-40) injection 40 mg  40 mg Other Once Mohammed Kindle, MD         Past Medical History:  Diagnosis Date  . Arthritis   . Cardiac arrhythmia due to congenital heart disease   . Chicken pox   . Depression   .  Headache, frequent episodic tension-type   . High cholesterol   . History of high blood pressure    readings  . Holter monitor, abnormal August 2012   done for long QT.  some contractile asynchrony [name])  . Hx of colonoscopy sept 2012   normal,  next due 2022, [name]  . Hypertension   . Migraines   . Morton's neuroma    bilateral  . Syncope     ROS:   All systems reviewed and negative except as noted in the HPI.   Past Surgical History:  Procedure Laterality Date  . ABDOMINAL HYSTERECTOMY  Dec 2013   [place]  . APPENDECTOMY  1973  . BI-VENTRICULAR PACEMAKER INSERTION (CRT-P)  05/2013   MDT CRTP implanted by Dr Lovena Le for cardiomyopathy, LBBB, and syncope  . HYSTEROSCOPY  2005   heavy bleeding  . IMPLANTABLE CARDIOVERTER DEFIBRILLATOR IMPLANT N/A 06/02/2013   Procedure: IMPLANTABLE CARDIOVERTER DEFIBRILLATOR IMPLANT;  Surgeon:  Evans Lance, MD;  Location: [facility] CATH LAB;  Service: Cardiovascular;  Laterality: N/A;  . SPINAL FUSION  03/2007   ruptured disc  L5  [name]  . TONSILLECTOMY AND ADENOIDECTOMY  1966     Family History  Problem Relation Age of Onset  . Arthritis Mother   . Hyperlipidemia Mother   . Cirrhosis Mother   . Arthritis Father   . Cancer Father        prostate cancer   . Hyperlipidemia Father   . Pulmonary embolism Father 36  . Alzheimer's disease Father   . Cancer Other        ovarian,uterus  . Heart disease Other   . Stroke Other   . Learning disabilities Other   . Coronary artery disease Paternal Grandfather 29  . Diabetes Brother   . Diabetes Maternal Uncle      Social History   Socioeconomic History  . Marital status: Married    Spouse name: Not on file  . Number of children: Not on file  . Years of education: Not on file  . Highest education level: Not on file  Social Needs  . Financial resource strain: Not on file  . Food insecurity - worry: Not on file  . Food insecurity - inability: Not on file  . Transportation needs - medical: Not on file  . Transportation needs - non-medical: Not on file  Occupational History  . Not on file  Tobacco Use  . Smoking status: Never Smoker  . Smokeless tobacco: Never Used  Substance and Sexual Activity  . Alcohol use: No  . Drug use: No  . Sexual activity: Yes  Other Topics Concern  . Not on file  Social History Narrative  . Not on file     BP 124/80   Pulse 80   Ht 5' 2.5" (1.588 m)   Wt 167 lb 12.8 oz (76.1 kg)   SpO2 96%   BMI 30.20 kg/m   Physical Exam:  Well appearing 61 year old woman, NAD HEENT: Unremarkable Neck: 6 cm JVD, no thyromegally Lymphatics:  No adenopathy Back:  No CVA tenderness Lungs:  Clear, with no wheezes, rales, or rhonchi.  Well-healed pacemaker incision HEART:  Regular rate rhythm, no murmurs, no rubs, no clicks Abd:  soft, positive bowel sounds, no organomegally, no rebound, no  guarding Ext:  2 plus pulses, no edema, no cyanosis, no clubbing Skin:  No rashes no nodules Neuro:  CN II through XII intact, motor grossly intact  EKG -normal sinus rhythm with pacing  induced by bundle branch block  DEVICE  Normal device function.  See PaceArt for details.   Assess/Plan: 1.  Chronic systolic heart failure -her symptoms are class II.  She is well compensated.  She will continue her current medical therapy. 2.  Biventricular pacemaker -Medtronic biventricular pacemaker is working normally today.  She has approximately 4 years of battery longevity.  Crissie Sickles, MD

## 2017-04-01 NOTE — Patient Instructions (Signed)
Medication Instructions:  Your physician recommends that you continue on your current medications as directed. Please refer to the Current Medication list given to you today.  Labwork: None ordered.  Testing/Procedures: None ordered.  Follow-Up: Your physician wants you to follow-up in: one year with [name].   You will receive a reminder letter in the mail two months in advance. If you don't receive a letter, please call our office to schedule the follow-up appointment.  Remote monitoring is used to monitor your Pacemaker from home. This monitoring reduces the number of office visits required to check your device to one time per year. It allows [place] to keep an eye on the functioning of your device to ensure it is working properly. You are scheduled for a device check from home on 06/18/2017. You may send your transmission at any time that day. If you have a wireless device, the transmission will be sent automatically. After your physician reviews your transmission, you will receive a postcard with your next transmission date.    Any Other Special Instructions Will Be Listed Below (If Applicable).     If you need a refill on your cardiac medications before your next appointment, please call your pharmacy.

## 2017-05-03 ENCOUNTER — Other Ambulatory Visit: Payer: Self-pay | Admitting: Internal Medicine

## 2017-06-18 ENCOUNTER — Ambulatory Visit (INDEPENDENT_AMBULATORY_CARE_PROVIDER_SITE_OTHER): Payer: BLUE CROSS/BLUE SHIELD | Admitting: *Deleted

## 2017-06-18 DIAGNOSIS — I428 Other cardiomyopathies: Secondary | ICD-10-CM | POA: Diagnosis not present

## 2017-06-18 NOTE — Progress Notes (Signed)
Remote pacemaker transmission.   

## 2017-06-19 ENCOUNTER — Encounter: Payer: Self-pay | Admitting: Cardiology

## 2017-07-02 ENCOUNTER — Other Ambulatory Visit: Payer: BLUE CROSS/BLUE SHIELD

## 2017-07-04 LAB — CUP PACEART REMOTE DEVICE CHECK
Battery Remaining Longevity: 49 mo
Battery Voltage: 2.98 V
Brady Statistic AP VP Percent: 0.09 %
Brady Statistic AS VP Percent: 99.61 %
Brady Statistic AS VS Percent: 0.3 %
Brady Statistic RV Percent Paced: 99.66 %
Date Time Interrogation Session: 20190228192341
Implantable Lead Implant Date: 20150212
Implantable Lead Implant Date: 20150212
Implantable Lead Location: 753858
Implantable Lead Location: 753859
Implantable Lead Model: 4194
Implantable Pulse Generator Implant Date: 20150212
Lead Channel Impedance Value: 380 Ohm
Lead Channel Impedance Value: 418 Ohm
Lead Channel Impedance Value: 475 Ohm
Lead Channel Impedance Value: 494 Ohm
Lead Channel Impedance Value: 570 Ohm
Lead Channel Pacing Threshold Amplitude: 0.5 V
Lead Channel Pacing Threshold Amplitude: 1.875 V
Lead Channel Pacing Threshold Pulse Width: 0.4 ms
Lead Channel Pacing Threshold Pulse Width: 0.4 ms
Lead Channel Sensing Intrinsic Amplitude: 18.625 mV
Lead Channel Sensing Intrinsic Amplitude: 2.25 mV
Lead Channel Setting Pacing Amplitude: 1.5 V
Lead Channel Setting Pacing Amplitude: 2 V
Lead Channel Setting Pacing Pulse Width: 0.4 ms
Lead Channel Setting Pacing Pulse Width: 0.8 ms
MDC IDC LEAD IMPLANT DT: 20150212
MDC IDC LEAD LOCATION: 753860
MDC IDC MSMT LEADCHNL LV IMPEDANCE VALUE: 456 Ohm
MDC IDC MSMT LEADCHNL LV IMPEDANCE VALUE: 608 Ohm
MDC IDC MSMT LEADCHNL RA IMPEDANCE VALUE: 380 Ohm
MDC IDC MSMT LEADCHNL RA IMPEDANCE VALUE: 380 Ohm
MDC IDC MSMT LEADCHNL RA SENSING INTR AMPL: 2.25 mV
MDC IDC MSMT LEADCHNL RV PACING THRESHOLD AMPLITUDE: 0.875 V
MDC IDC MSMT LEADCHNL RV PACING THRESHOLD PULSEWIDTH: 0.4 ms
MDC IDC MSMT LEADCHNL RV SENSING INTR AMPL: 18.625 mV
MDC IDC SET LEADCHNL LV PACING AMPLITUDE: 2.5 V
MDC IDC SET LEADCHNL RV SENSING SENSITIVITY: 0.9 mV
MDC IDC STAT BRADY AP VS PERCENT: 0 %
MDC IDC STAT BRADY RA PERCENT PACED: 0.09 %

## 2017-07-06 ENCOUNTER — Ambulatory Visit: Payer: BLUE CROSS/BLUE SHIELD | Admitting: Internal Medicine

## 2017-07-29 ENCOUNTER — Other Ambulatory Visit: Payer: Self-pay | Admitting: Internal Medicine

## 2017-08-11 ENCOUNTER — Other Ambulatory Visit: Payer: Self-pay | Admitting: Internal Medicine

## 2017-08-18 ENCOUNTER — Telehealth: Payer: Self-pay | Admitting: Radiology

## 2017-08-18 DIAGNOSIS — E119 Type 2 diabetes mellitus without complications: Secondary | ICD-10-CM

## 2017-08-18 DIAGNOSIS — E782 Mixed hyperlipidemia: Secondary | ICD-10-CM

## 2017-08-18 NOTE — Telephone Encounter (Signed)
Pt coming in for labs Friday, please place future orders. Thank you.  

## 2017-08-18 NOTE — Addendum Note (Signed)
Addended by: Crecencio Mc on: 08/18/2017 09:05 AM   Modules accepted: Orders

## 2017-08-18 NOTE — Telephone Encounter (Signed)
Julia Pierce,   I appreciate your ordering of labs  But I think you are doing them in a way that [place] cannot see them  (see chart).  I make this mistake too,  I think you HAVE to go to the EDIT page and click FUTURE

## 2017-08-18 NOTE — Telephone Encounter (Signed)
Labs re-ordered

## 2017-08-20 LAB — HM DIABETES EYE EXAM

## 2017-08-21 ENCOUNTER — Other Ambulatory Visit (INDEPENDENT_AMBULATORY_CARE_PROVIDER_SITE_OTHER): Payer: BLUE CROSS/BLUE SHIELD

## 2017-08-21 DIAGNOSIS — E782 Mixed hyperlipidemia: Secondary | ICD-10-CM

## 2017-08-21 DIAGNOSIS — E119 Type 2 diabetes mellitus without complications: Secondary | ICD-10-CM | POA: Diagnosis not present

## 2017-08-21 LAB — COMPREHENSIVE METABOLIC PANEL
ALT: 17 U/L (ref 0–35)
AST: 18 U/L (ref 0–37)
Albumin: 4.1 g/dL (ref 3.5–5.2)
Alkaline Phosphatase: 78 U/L (ref 39–117)
BILIRUBIN TOTAL: 0.4 mg/dL (ref 0.2–1.2)
BUN: 17 mg/dL (ref 6–23)
CO2: 30 meq/L (ref 19–32)
CREATININE: 0.66 mg/dL (ref 0.40–1.20)
Calcium: 9.6 mg/dL (ref 8.4–10.5)
Chloride: 102 mEq/L (ref 96–112)
GFR: 96.11 mL/min (ref >60.00)
GLUCOSE: 168 mg/dL — AB (ref 70–99)
Potassium: 4.3 mEq/L (ref 3.5–5.1)
SODIUM: 141 meq/L (ref 135–145)
TOTAL PROTEIN: 6.8 g/dL (ref 6.0–8.3)

## 2017-08-21 LAB — LDL CHOLESTEROL, DIRECT: Direct LDL: 112 mg/dL

## 2017-08-21 LAB — LIPID PANEL
CHOL/HDL RATIO: 6
Cholesterol: 204 mg/dL — ABNORMAL HIGH (ref 0–200)
HDL: 35.9 mg/dL — AB (ref >39.00)
Triglycerides: 461 mg/dL — ABNORMAL HIGH (ref 0.0–149.0)

## 2017-08-21 LAB — HEMOGLOBIN A1C: Hgb A1c MFr Bld: 7.2 % — ABNORMAL HIGH (ref 4.6–6.5)

## 2017-08-22 LAB — HM MAMMOGRAPHY

## 2017-08-25 ENCOUNTER — Encounter: Payer: Self-pay | Admitting: Internal Medicine

## 2017-08-25 ENCOUNTER — Telehealth: Payer: Self-pay | Admitting: Internal Medicine

## 2017-08-25 ENCOUNTER — Ambulatory Visit (INDEPENDENT_AMBULATORY_CARE_PROVIDER_SITE_OTHER): Payer: BLUE CROSS/BLUE SHIELD | Admitting: Internal Medicine

## 2017-08-25 DIAGNOSIS — E119 Type 2 diabetes mellitus without complications: Secondary | ICD-10-CM | POA: Diagnosis not present

## 2017-08-25 DIAGNOSIS — I10 Essential (primary) hypertension: Secondary | ICD-10-CM

## 2017-08-25 DIAGNOSIS — M4802 Spinal stenosis, cervical region: Secondary | ICD-10-CM | POA: Diagnosis not present

## 2017-08-25 DIAGNOSIS — E669 Obesity, unspecified: Secondary | ICD-10-CM

## 2017-08-25 DIAGNOSIS — F320 Major depressive disorder, single episode, mild: Secondary | ICD-10-CM

## 2017-08-25 DIAGNOSIS — E785 Hyperlipidemia, unspecified: Secondary | ICD-10-CM

## 2017-08-25 MED ORDER — PREDNISONE 10 MG PO TABS: ORAL_TABLET | ORAL | 0 refills | Status: DC

## 2017-08-25 MED ORDER — METHOCARBAMOL 500 MG PO TABS: ORAL_TABLET | ORAL | 5 refills | Status: DC

## 2017-08-25 NOTE — Progress Notes (Signed)
Subjective:  Patient ID: Julia Pierce, female    DOB: 06/04/1955  Age: 62 y.o. MRN: [id number]  CC: Diagnoses of Obesity (BMI 30.0-34.9), Diabetes mellitus type 2, diet-controlled ([place]), Cervical stenosis of spine, Essential hypertension, and Depression, major, single episode, mild (Julia Pierce) were pertinent to this visit.  HPI Julia Pierce presents for follow up on diabetes.  Patient has no complaints today.  Patient is NOT following a low glycemic index diet and has been eating poorly  But is taking all prescribed medications regularly without side effects.  Fasting sugars have been under less than 140 most of the time and post prandials have been under 180 except on rare occasions. Patient is not currently exercising , has been stress eating and  has gained 8 lbs  Since her last visit . Lots of family stressors  Cited as triggers for stress eating. Julia Pierce died and she is the executor of his estate.  Has stayed away from candy  Discussed diet. And ways to stay on diet even when  travelling .      Marland Kitchen  Patient  Just had an eye exam  Last week,  No retinopathy  and checks feet regularly for signs of infection.  Patient does not walk barefoot outside,  And denies any numbness tingling or burning in feet. Patient is up to date on all recommended vaccinations   Neck pain started 2 weeks ago started Easter Sunday,  No unusual activity as a trigger.  Pain was so severe it was hard to swallow because her neck muscles were in spasm (points to [facility] 's bilaterally ).  Tried her traction device.   And massage therapy both of which helped. Pressure therapy with  Use of tennis balls has helped,  Currently  90% better.  Has not had cervical spine surgery.  CT done last Sept noted severe moderate central and severe left foraminal  stenosis at C5-6 level   And C6-7   Lab Results  Component Value Date   HGBA1C 7.2 (H) 08/21/2017    Non-ischemic Cardiomyopathy: had Follow up Dec with Lovena Le,  No changes to regimen   1 yr follow up.  Most recent EF 45% with inferior wall hypokinesis (2015)  Outpatient Medications Prior to Visit  Medication Sig Dispense Refill  . Azelaic Acid (FINACEA EX) Apply 1 application topically 2 (two) times daily.    . B Complex Vitamins (B COMPLEX PO) Take 1 tablet by mouth daily.     . calcium elemental as carbonate (BARIATRIC TUMS ULTRA) 400 MG chewable tablet Chew by mouth.    . Cholecalciferol (VITAMIN D3) 2000 UNITS TABS Take 1 tablet by mouth daily.    Marland Kitchen esomeprazole (NEXIUM) 20 MG capsule Take 20 mg by mouth daily at 12 noon.     . fluticasone (FLONASE) 50 MCG/ACT nasal spray Place 2 sprays into both nostrils daily.    . furosemide (LASIX) 20 MG tablet Take 1 tablet (20 mg total) by mouth daily. As needed for fluid retention 30 tablet 0  . losartan-hydrochlorothiazide (HYZAAR) 50-12.5 MG tablet TAKE 1 TABLET BY MOUTH EVERY DAY 90 tablet 1  . metoprolol tartrate (LOPRESSOR) 25 MG tablet Take 12.5 mg by mouth daily. Reported on 05/17/2015    . Multiple Vitamin (MULTIVITAMIN) tablet Take 1 tablet by mouth daily.      . sertraline (ZOLOFT) 100 MG tablet TAKE 1 TABLET BY MOUTH EVERY DAY 90 tablet 0  . spironolactone (ALDACTONE) 25 MG tablet Take 25 mg by mouth daily.    Marland Kitchen  tolterodine (DETROL) 2 MG tablet TAKE 1 TABLET TWICE A DAY 180 tablet 4  . methocarbamol (ROBAXIN) 500 MG tablet LIMIT 1 TABLET BY MOUTH PER DAY OR 2-3 TIMES PER DAY AS NEEDED 90 tablet 5  . baclofen (LIORESAL) 10 MG tablet Take 10 mg by mouth 2 (two) times daily.    Marland Kitchen aspirin EC 81 MG tablet Take 81 mg by mouth daily.    . Azelaic Acid 15 % cream APPLY TO AFFECTED AREA(S) ONCE OR TWICE A DAY  1   Facility-Administered Medications Prior to Visit  Medication Dose Route Frequency Provider Last Rate Last Dose  . diclofenac sodium (VOLTAREN) 1 % transdermal gel 4 g  4 g Topical QID Mohammed Kindle, MD      . midazolam (VERSED) 5 MG/5ML injection 5 mg  5 mg Intravenous Once Mohammed Kindle, MD      . orphenadrine  (NORFLEX) injection 60 mg  60 mg Intramuscular Once Mohammed Kindle, MD      . sodium chloride 0.9 % injection 20 mL  20 mL Other Once Mohammed Kindle, MD      . triamcinolone acetonide (KENALOG-40) injection 40 mg  40 mg Other Once Mohammed Kindle, MD        Review of Systems;  Patient denies headache, fevers, malaise, unintentional weight loss, skin rash, eye pain, sinus congestion and sinus pain, sore throat, dysphagia,  hemoptysis , cough, dyspnea, wheezing, chest pain, palpitations, orthopnea, edema, abdominal pain, nausea, melena, diarrhea, constipation, flank pain, dysuria, hematuria, urinary  Frequency, nocturia, numbness, tingling, seizures,  Focal weakness, Loss of consciousness,  Tremor, insomnia, depression, anxiety, and suicidal ideation.      Objective:  BP 128/78 (BP Location: Left Arm, Patient Position: Sitting, Cuff Size: Normal)   Pulse 78   Temp 97.9 F (36.6 C) (Oral)   Resp 15   Ht 5' 2.5" (1.588 m)   Wt 175 lb 9.6 oz (79.7 kg)   SpO2 95%   BMI 31.61 kg/m   BP Readings from Last 3 Encounters:  08/25/17 128/78  04/01/17 124/80  03/18/17 134/82    Wt Readings from Last 3 Encounters:  08/25/17 175 lb 9.6 oz (79.7 kg)  04/01/17 167 lb 12.8 oz (76.1 kg)  03/18/17 168 lb (76.2 kg)    General appearance: alert, cooperative and appears stated age Ears: normal TM's and external ear canals both ears Throat: lips, mucosa, and tongue normal; teeth and gums normal Neck: no adenopathy, no carotid bruit, supple, symmetrical, trachea midline and thyroid not enlarged, symmetric, no tenderness/mass/nodules Back: symmetric, no curvature. ROM normal. No CVA tenderness. Lungs: clear to auscultation bilaterally Heart: regular rate and rhythm, S1, S2 normal, no murmur, click, rub or gallop Abdomen: soft, non-tender; bowel sounds normal; no masses,  no organomegaly Pulses: 2+ and symmetric Skin: Skin color, texture, turgor normal. No rashes or lesions Lymph nodes: Cervical,  supraclavicular, and axillary nodes normal.  Lab Results  Component Value Date   HGBA1C 7.2 (H) 08/21/2017   HGBA1C 6.9 (H) 03/16/2017   HGBA1C 6.9 (H) 12/12/2016    Lab Results  Component Value Date   CREATININE 0.66 08/21/2017   CREATININE 0.71 03/16/2017   CREATININE 0.73 12/12/2016    Lab Results  Component Value Date   WBC 8.0 09/11/2016   HGB 13.6 09/11/2016   HCT 40.8 09/11/2016   PLT 257.0 09/11/2016   GLUCOSE 168 (H) 08/21/2017   CHOL 204 (H) 08/21/2017   TRIG (H) 08/21/2017    461.0 Triglyceride is over 400;  calculations on Lipids are invalid.   HDL 35.90 (L) 08/21/2017   LDLDIRECT 112.0 08/21/2017   LDLCALC 88 08/17/2013   ALT 17 08/21/2017   AST 18 08/21/2017   NA 141 08/21/2017   K 4.3 08/21/2017   CL 102 08/21/2017   CREATININE 0.66 08/21/2017   BUN 17 08/21/2017   CO2 30 08/21/2017   TSH 3.80 12/12/2016   HGBA1C 7.2 (H) 08/21/2017   MICROALBUR 0.7 06/09/2016    Ct Cervical Spine Wo Contrast  Result Date: 12/31/2016 CLINICAL DATA:  Bilateral hand numbness and weakness, worse on the left. Cervical radiculopathy. EXAM: CT CERVICAL SPINE WITHOUT CONTRAST TECHNIQUE: Multidetector CT imaging of the cervical spine was performed without intravenous contrast. Multiplanar CT image reconstructions were also generated. COMPARISON:  MRI of cervical spine 10/18/2012 FINDINGS: Alignment: This AP alignment is anatomic. There straightening of the normal cervical lordosis, similar the prior study. Skull base and vertebrae: The craniocervical junction is normal. Vertebral body heights are maintained. Endplate Schmorl's nodes are stable. Soft tissues and spinal canal: The soft tissues the neck demonstrate atherosclerotic calcifications at the aortic arch. 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 No significant adenopathy is present. The visualized salivary glands are within normal limits. Disc levels:  C2-3:  Negative. C3-4:  Negative. C4-5: Asymmetric left-sided uncovertebral spurring is  noted. Mild left foraminal stenosis is stable. C5-6: A broad-based disc osteophyte complex present. Uncovertebral spurring is worse on the left. Moderate central and severe left foraminal stenosis is stable. C6-7: A broad-based disc osteophyte complex is present. There is progressive effacement of the ventral CSF. Moderate left and mild right foraminal stenosis is stable. C7-T1:  Negative. Upper chest: The lung apices are clear. IMPRESSION: 1. Mild left foraminal narrowing at C3-4 is stable. 2. Moderate central and severe left foraminal stenosis at C5-6 is similar the prior exam. 3. Moderate left and mild right foraminal stenosis at C6-7 is similar the prior study. 4. Progressive central in left central canal stenosis at C6-7. Electronically Signed   By: San Morelle M.D.   On: 12/31/2016 15:04    Assessment & Plan:   Problem List Items Addressed This Visit    Obesity (BMI 30.0-34.9)    I have addressed  BMI and recommended a low glycemic index diet utilizing smaller more frequent meals to increase metabolism.  I have also recommended that patient resume exercising with a goal of 30 minutes of aerobic exercise a minimum of 5 days per week.      Hypertension    Well controlled on current regimen. Renal function stable, no changes today.  Lab Results  Component Value Date   CREATININE 0.66 08/21/2017   Lab Results  Component Value Date   NA 141 08/21/2017   K 4.3 08/21/2017   CL 102 08/21/2017   CO2 30 08/21/2017         Diabetes mellitus type 2, diet-controlled ([facility])    Slight loss of control due to stress eating and lack of exercise .  counselling given,  Return in 3 months for recheck.  No medications at this time. Continue ARB.  Statin intolerant  Lab Results  Component Value Date   HGBA1C 7.2 (H) 08/21/2017   Lab Results  Component Value Date   MICROALBUR 0.7 06/09/2016         Depression, major, single episode, mild ([facility])    Managed with zoloft,  Concurrent  anxiety also managed    Family stressors  Cited and explored.  Stress eating idenitified as coping mechanism and  discouraged. . Continue zoloft.       Cervical stenosis of spine    With recent spasm of neck muscles,  Now resolved. Prednisone taper and MR refills given for use with nenxt occurrence.           I have discontinued Tenishia Eliasen's aspirin EC and Azelaic Acid. I am also having her start on predniSONE. Additionally, I am having her maintain her multivitamin, spironolactone, B Complex Vitamins (B COMPLEX PO), Azelaic Acid (FINACEA EX), Vitamin D3, esomeprazole, fluticasone, metoprolol tartrate, furosemide, calcium elemental as carbonate, tolterodine, baclofen, losartan-hydrochlorothiazide, sertraline, and methocarbamol. We will continue to administer midazolam, orphenadrine, sodium chloride, triamcinolone acetonide, and diclofenac sodium.  Meds ordered this encounter  Medications  . DISCONTD: methocarbamol (ROBAXIN) 500 MG tablet    Sig: LIMIT 1 TABLET BY MOUTH PER DAY OR 2-3 TIMES PER DAY AS NEEDED    Dispense:  90 tablet    Refill:  5  . predniSONE (DELTASONE) 10 MG tablet    Sig: 6 tablets on Day 1 , then reduce by 1 tablet daily until gone    Dispense:  21 tablet    Refill:  0  . methocarbamol (ROBAXIN) 500 MG tablet    Sig: LIMIT 1 TABLET BY MOUTH PER DAY OR 2-3 TIMES PER DAY AS NEEDED    Dispense:  90 tablet    Refill:  5    Medications Discontinued During This Encounter  Medication Reason  . aspirin EC 81 MG tablet Patient has not taken in last 30 days  . Azelaic Acid 15 % cream Duplicate  . methocarbamol (ROBAXIN) 500 MG tablet Reorder  . methocarbamol (ROBAXIN) 500 MG tablet Reorder   A total of 25 minutes of face to face time was spent with patient more than half of which was spent in counselling about the above mentioned conditions  and coordination of care    Follow-up: Return in about 3 months (around 11/25/2017) for follow up diabetes.   Crecencio Mc,  MD

## 2017-08-25 NOTE — Patient Instructions (Signed)
Your diabetes  Has lost a little control on current regimen,and your triglycerides  Are much higher  compared to last time. your anemia is stable.  No medication changes are needed at this time, but please try increase your exercise,  Decrease your junk food , and eat more foods that  Are low carb/low cal (vegetables!)   I have refilled a prednisone taper  To use the next itme you have a flare of your neck issues   Mediterranean Diet A Mediterranean diet refers to food and lifestyle choices that are based on the traditions of countries located on the [employer]. This way of eating has been shown to help prevent certain conditions and improve outcomes for people who have chronic diseases, like kidney disease and heart disease. What are tips for following this plan? Lifestyle  Cook and eat meals together with your family, when possible.  Drink enough fluid to keep your urine clear or pale yellow.  Be physically active every day. This includes: ? Aerobic exercise like running or swimming. ? Leisure activities like gardening, walking, or housework.  Get 7-8 hours of sleep each night.  If recommended by your health care provider, drink red wine in moderation. This means 1 glass a day for nonpregnant women and 2 glasses a day for men. A glass of wine equals 5 oz (150 mL). Reading food labels  Check the serving size of packaged foods. For foods such as rice and pasta, the serving size refers to the amount of cooked product, not dry.  Check the total fat in packaged foods. Avoid foods that have saturated fat or trans fats.  Check the ingredients list for added sugars, such as corn syrup. Shopping  At the grocery store, buy most of your food from the areas near the walls of the store. This includes: ? Fresh fruits and vegetables (produce). ? Grains, beans, nuts, and seeds. Some of these may be available in unpackaged forms or large amounts (in bulk). ? Fresh seafood. ? Poultry and  eggs. ? Low-fat dairy products.  Buy whole ingredients instead of prepackaged foods.  Buy fresh fruits and vegetables in-season from local farmers markets.  Buy frozen fruits and vegetables in resealable bags.  If you do not have access to quality fresh seafood, buy precooked frozen shrimp or canned fish, such as tuna, salmon, or sardines.  Buy small amounts of raw or cooked vegetables, salads, or olives from the deli or salad bar at your store.  Stock your pantry so you always have certain foods on hand, such as olive oil, canned tuna, canned tomatoes, rice, pasta, and beans. Cooking  Cook foods with extra-virgin olive oil instead of using butter or other vegetable oils.  Have meat as a side dish, and have vegetables or grains as your main dish. This means having meat in small portions or adding small amounts of meat to foods like pasta or stew.  Use beans or vegetables instead of meat in common dishes like chili or lasagna.  Experiment with different cooking methods. Try roasting or broiling vegetables instead of steaming or sauteing them.  Add frozen vegetables to soups, stews, pasta, or rice.  Add nuts or seeds for added healthy fat at each meal. You can add these to yogurt, salads, or vegetable dishes.  Marinate fish or vegetables using olive oil, lemon juice, garlic, and fresh herbs. Meal planning  Plan to eat 1 vegetarian meal one day each week. Try to work up to 2 vegetarian meals, if  possible.  Eat seafood 2 or more times a week.  Have healthy snacks readily available, such as: ? Vegetable sticks with hummus. ? [place] yogurt. ? Fruit and nut trail mix.  Eat balanced meals throughout the week. This includes: ? Fruit: 2-3 servings a day ? Vegetables: 4-5 servings a day ? Low-fat dairy: 2 servings a day ? Fish, poultry, or lean meat: 1 serving a day ? Beans and legumes: 2 or more servings a week ? Nuts and seeds: 1-2 servings a day ? Whole grains: 6-8 servings a  day ? Extra-virgin olive oil: 3-4 servings a day  Limit red meat and sweets to only a few servings a month What are my food choices?  Mediterranean diet ? Recommended ? Grains: Whole-grain pasta. Brown rice. Bulgar wheat. Polenta. Couscous. Whole-wheat bread. [place]. ? Vegetables: Artichokes. Beets. Broccoli. Cabbage. Carrots. Eggplant. Green beans. Chard. Kale. Spinach. Onions. Leeks. Peas. Squash. Tomatoes. Peppers. Radishes. ? Fruits: Apples. Apricots. Avocado. Berries. Bananas. Cherries. Dates. Figs. Grapes. Lemons. Melon. Oranges. Peaches. Plums. Pomegranate. ? Meats and other protein foods: Beans. Almonds. Sunflower seeds. Pine nuts. Peanuts. [place]. Salmon. Scallops. Shrimp. Coffey. Tilapia. Clams. Oysters. Eggs. ? Dairy: Low-fat milk. Cheese. Greek yogurt. ? Beverages: Water. Red wine. Herbal tea. ? Fats and oils: Extra virgin olive oil. Avocado oil. Grape seed oil. ? Sweets and desserts: [place] yogurt with honey. Baked apples. Poached pears. Trail mix. ? Seasoning and other foods: Basil. Cilantro. Coriander. Cumin. Mint. Parsley. Sage. Rosemary. Tarragon. Garlic. Oregano. Thyme. Pepper. Balsalmic vinegar. Tahini. Hummus. Tomato sauce. Olives. Mushrooms. ? Limit these ? Grains: Prepackaged pasta or rice dishes. Prepackaged cereal with added sugar. ? Vegetables: Deep fried potatoes (french fries). ? Fruits: Fruit canned in syrup. ? Meats and other protein foods: Beef. Pork. Lamb. Poultry with skin. Hot dogs. Berniece Salines. ? Dairy: Ice cream. Sour cream. Whole milk. ? Beverages: Juice. Sugar-sweetened soft drinks. Beer. Liquor and spirits. ? Fats and oils: Butter. Canola oil. Vegetable oil. Beef fat (tallow). Lard. ? Sweets and desserts: Cookies. Cakes. Pies. Candy. ? Seasoning and other foods: Mayonnaise. Premade sauces and marinades. ? The items listed may not be a complete list. Talk with your dietitian about what dietary choices are right for you. Summary  The Mediterranean diet  includes both food and lifestyle choices.  Eat a variety of fresh fruits and vegetables, beans, nuts, seeds, and whole grains.  Limit the amount of red meat and sweets that you eat.  Talk with your health care provider about whether it is safe for you to drink red wine in moderation. This means 1 glass a day for nonpregnant women and 2 glasses a day for men. A glass of wine equals 5 oz (150 mL). This information is not intended to replace advice given to you by your health care provider. Make sure you discuss any questions you have with your health care provider. Document Released: 11/29/2015 Document Revised: 01/01/2016 Document Reviewed: 11/29/2015 Elsevier Interactive Patient Education  [employer].

## 2017-08-25 NOTE — Telephone Encounter (Signed)
Patient requesting fasting labs to be done on 8.23.19. Needing orders in the system.

## 2017-08-27 ENCOUNTER — Encounter: Payer: Self-pay | Admitting: Internal Medicine

## 2017-08-27 DIAGNOSIS — F320 Major depressive disorder, single episode, mild: Secondary | ICD-10-CM | POA: Insufficient documentation

## 2017-08-27 NOTE — Assessment & Plan Note (Signed)
Managed with zoloft,  Concurrent anxiety also managed    Family stressors  Cited and explored.  Stress eating idenitified as coping mechanism and discouraged. . Continue zoloft.

## 2017-08-27 NOTE — Assessment & Plan Note (Signed)
I have addressed  BMI and recommended a low glycemic index diet utilizing smaller more frequent meals to increase metabolism.  I have also recommended that patient resume exercising with a goal of 30 minutes of aerobic exercise a minimum of 5 days per week. 

## 2017-08-27 NOTE — Assessment & Plan Note (Signed)
Well controlled on current regimen. Renal function stable, no changes today.  Lab Results  Component Value Date   CREATININE 0.66 08/21/2017   Lab Results  Component Value Date   NA 141 08/21/2017   K 4.3 08/21/2017   CL 102 08/21/2017   CO2 30 08/21/2017

## 2017-08-27 NOTE — Assessment & Plan Note (Signed)
Slight loss of control due to stress eating and lack of exercise .  counselling given,  Return in 3 months for recheck.  No medications at this time. Continue ARB.  Statin intolerant  Lab Results  Component Value Date   HGBA1C 7.2 (H) 08/21/2017   Lab Results  Component Value Date   MICROALBUR 0.7 06/09/2016

## 2017-08-27 NOTE — Assessment & Plan Note (Addendum)
With recent spasm of neck muscles,  Now resolved. Prednisone taper and MR refills given for use with nenxt occurrence.

## 2017-08-31 NOTE — Telephone Encounter (Signed)
Ordered Lipid panel, CMP, LDL, and A1C. Is there anything else that needs to be ordered?

## 2017-08-31 NOTE — Addendum Note (Signed)
Addended by: Adair Laundry on: 08/31/2017 11:45 AM   Modules accepted: Orders

## 2017-08-31 NOTE — Telephone Encounter (Signed)
perfect

## 2017-09-17 ENCOUNTER — Ambulatory Visit (INDEPENDENT_AMBULATORY_CARE_PROVIDER_SITE_OTHER): Payer: BLUE CROSS/BLUE SHIELD | Admitting: *Deleted

## 2017-09-17 DIAGNOSIS — I428 Other cardiomyopathies: Secondary | ICD-10-CM | POA: Diagnosis not present

## 2017-09-17 NOTE — Progress Notes (Signed)
Remote pacemaker transmission.   

## 2017-09-21 ENCOUNTER — Encounter: Payer: Self-pay | Admitting: Cardiology

## 2017-09-22 LAB — CUP PACEART REMOTE DEVICE CHECK
Battery Remaining Longevity: 46 mo
Battery Voltage: 2.98 V
Brady Statistic AS VS Percent: 0.29 %
Implantable Lead Implant Date: 20150212
Implantable Lead Implant Date: 20150212
Implantable Lead Implant Date: 20150212
Implantable Lead Location: 753858
Implantable Lead Model: 5076
Implantable Pulse Generator Implant Date: 20150212
Lead Channel Impedance Value: 380 Ohm
Lead Channel Impedance Value: 380 Ohm
Lead Channel Impedance Value: 380 Ohm
Lead Channel Impedance Value: 418 Ohm
Lead Channel Pacing Threshold Amplitude: 0.75 V
Lead Channel Pacing Threshold Amplitude: 1.875 V
Lead Channel Pacing Threshold Pulse Width: 0.4 ms
Lead Channel Sensing Intrinsic Amplitude: 1.25 mV
Lead Channel Sensing Intrinsic Amplitude: 18 mV
Lead Channel Sensing Intrinsic Amplitude: 18 mV
Lead Channel Setting Pacing Amplitude: 1.5 V
Lead Channel Setting Pacing Amplitude: 2.5 V
Lead Channel Setting Sensing Sensitivity: 0.9 mV
MDC IDC LEAD LOCATION: 753859
MDC IDC LEAD LOCATION: 753860
MDC IDC MSMT LEADCHNL LV IMPEDANCE VALUE: 437 Ohm
MDC IDC MSMT LEADCHNL LV IMPEDANCE VALUE: 475 Ohm
MDC IDC MSMT LEADCHNL LV IMPEDANCE VALUE: 532 Ohm
MDC IDC MSMT LEADCHNL LV IMPEDANCE VALUE: 589 Ohm
MDC IDC MSMT LEADCHNL LV PACING THRESHOLD PULSEWIDTH: 0.4 ms
MDC IDC MSMT LEADCHNL RA PACING THRESHOLD AMPLITUDE: 0.625 V
MDC IDC MSMT LEADCHNL RA PACING THRESHOLD PULSEWIDTH: 0.4 ms
MDC IDC MSMT LEADCHNL RA SENSING INTR AMPL: 1.25 mV
MDC IDC MSMT LEADCHNL RV IMPEDANCE VALUE: 475 Ohm
MDC IDC SESS DTM: 20190530173036
MDC IDC SET LEADCHNL LV PACING PULSEWIDTH: 0.8 ms
MDC IDC SET LEADCHNL RV PACING AMPLITUDE: 2 V
MDC IDC SET LEADCHNL RV PACING PULSEWIDTH: 0.4 ms
MDC IDC STAT BRADY AP VP PERCENT: 0.02 %
MDC IDC STAT BRADY AP VS PERCENT: 0 %
MDC IDC STAT BRADY AS VP PERCENT: 99.69 %
MDC IDC STAT BRADY RA PERCENT PACED: 0.03 %
MDC IDC STAT BRADY RV PERCENT PACED: 99.71 %

## 2017-09-30 DIAGNOSIS — Z950 Presence of cardiac pacemaker: Secondary | ICD-10-CM | POA: Diagnosis not present

## 2017-09-30 DIAGNOSIS — I10 Essential (primary) hypertension: Secondary | ICD-10-CM | POA: Diagnosis not present

## 2017-09-30 DIAGNOSIS — E782 Mixed hyperlipidemia: Secondary | ICD-10-CM | POA: Diagnosis not present

## 2017-09-30 DIAGNOSIS — I420 Dilated cardiomyopathy: Secondary | ICD-10-CM | POA: Diagnosis not present

## 2017-10-15 DIAGNOSIS — Z950 Presence of cardiac pacemaker: Secondary | ICD-10-CM | POA: Diagnosis not present

## 2017-10-15 DIAGNOSIS — I420 Dilated cardiomyopathy: Secondary | ICD-10-CM | POA: Diagnosis not present

## 2017-10-15 DIAGNOSIS — I10 Essential (primary) hypertension: Secondary | ICD-10-CM | POA: Diagnosis not present

## 2017-10-15 DIAGNOSIS — E782 Mixed hyperlipidemia: Secondary | ICD-10-CM | POA: Diagnosis not present

## 2017-10-23 ENCOUNTER — Other Ambulatory Visit: Payer: Self-pay | Admitting: Internal Medicine

## 2017-12-09 DIAGNOSIS — Z01419 Encounter for gynecological examination (general) (routine) without abnormal findings: Secondary | ICD-10-CM | POA: Diagnosis not present

## 2017-12-09 DIAGNOSIS — Z1231 Encounter for screening mammogram for malignant neoplasm of breast: Secondary | ICD-10-CM | POA: Diagnosis not present

## 2017-12-11 ENCOUNTER — Other Ambulatory Visit (INDEPENDENT_AMBULATORY_CARE_PROVIDER_SITE_OTHER): Payer: BLUE CROSS/BLUE SHIELD

## 2017-12-11 DIAGNOSIS — I10 Essential (primary) hypertension: Secondary | ICD-10-CM | POA: Diagnosis not present

## 2017-12-11 DIAGNOSIS — E785 Hyperlipidemia, unspecified: Secondary | ICD-10-CM | POA: Diagnosis not present

## 2017-12-11 DIAGNOSIS — E119 Type 2 diabetes mellitus without complications: Secondary | ICD-10-CM

## 2017-12-11 LAB — COMPREHENSIVE METABOLIC PANEL
ALBUMIN: 4.3 g/dL (ref 3.5–5.2)
ALT: 17 U/L (ref 0–35)
AST: 18 U/L (ref 0–37)
Alkaline Phosphatase: 84 U/L (ref 39–117)
BUN: 14 mg/dL (ref 6–23)
CHLORIDE: 101 meq/L (ref 96–112)
CO2: 28 mEq/L (ref 19–32)
CREATININE: 0.68 mg/dL (ref 0.40–1.20)
Calcium: 9.6 mg/dL (ref 8.4–10.5)
GFR: 92.77 mL/min (ref >60.00)
GLUCOSE: 161 mg/dL — AB (ref 70–99)
Potassium: 4.3 mEq/L (ref 3.5–5.1)
SODIUM: 139 meq/L (ref 135–145)
Total Bilirubin: 0.5 mg/dL (ref 0.2–1.2)
Total Protein: 7.1 g/dL (ref 6.0–8.3)

## 2017-12-11 LAB — LIPID PANEL
CHOL/HDL RATIO: 6
CHOLESTEROL: 219 mg/dL — AB (ref 0–200)
HDL: 37.9 mg/dL — ABNORMAL LOW (ref >39.00)
NONHDL: 180.73
Triglycerides: 343 mg/dL — ABNORMAL HIGH (ref 0.0–149.0)
VLDL: 68.6 mg/dL — ABNORMAL HIGH (ref 0.0–40.0)

## 2017-12-11 LAB — POCT GLYCOSYLATED HEMOGLOBIN (HGB A1C): HEMOGLOBIN A1C: 7.5 % — AB (ref 4.0–5.6)

## 2017-12-11 LAB — LDL CHOLESTEROL, DIRECT: LDL DIRECT: 135 mg/dL

## 2017-12-16 ENCOUNTER — Ambulatory Visit (INDEPENDENT_AMBULATORY_CARE_PROVIDER_SITE_OTHER): Payer: BLUE CROSS/BLUE SHIELD

## 2017-12-16 ENCOUNTER — Ambulatory Visit (INDEPENDENT_AMBULATORY_CARE_PROVIDER_SITE_OTHER): Payer: BLUE CROSS/BLUE SHIELD | Admitting: Internal Medicine

## 2017-12-16 ENCOUNTER — Encounter: Payer: Self-pay | Admitting: Internal Medicine

## 2017-12-16 VITALS — BP 128/82 | HR 72 | Temp 98.4°F | Resp 13 | Ht 62.5 in | Wt 176.0 lb

## 2017-12-16 DIAGNOSIS — I10 Essential (primary) hypertension: Secondary | ICD-10-CM | POA: Diagnosis not present

## 2017-12-16 DIAGNOSIS — I5022 Chronic systolic (congestive) heart failure: Secondary | ICD-10-CM | POA: Diagnosis not present

## 2017-12-16 DIAGNOSIS — E782 Mixed hyperlipidemia: Secondary | ICD-10-CM

## 2017-12-16 DIAGNOSIS — Z0000 Encounter for general adult medical examination without abnormal findings: Secondary | ICD-10-CM

## 2017-12-16 DIAGNOSIS — E119 Type 2 diabetes mellitus without complications: Secondary | ICD-10-CM | POA: Diagnosis not present

## 2017-12-16 MED ORDER — SITAGLIPTIN PHOSPHATE 50 MG PO TABS: 50.0000 mg | ORAL_TABLET | Freq: Every day | ORAL | 5 refills | Status: DC

## 2017-12-16 NOTE — Progress Notes (Signed)
Subjective:   Nadirah Socorro is a 62 y.o. female who presents for Medicare Annual (Subsequent) preventive examination.  Review of Systems:  No ROS.  Medicare Wellness Visit. Additional risk factors are reflected in the social history.  Cardiac Risk Factors include: obesity (BMI >30kg/m2);diabetes mellitus;hypertension     Objective:     Vitals: BP 128/82 (BP Location: Left Arm, Patient Position: Sitting, Cuff Size: Normal)   Pulse 72   Temp 98.4 F (36.9 C) (Oral)   Resp 13   Ht 5' 2.5" (1.588 m)   Wt 176 lb (79.8 kg)   SpO2 97%   BMI 31.68 kg/m   Body mass index is 31.68 kg/m.  Advanced Directives 12/16/2017 12/15/2016 12/31/2015 11/27/2015 10/26/2015 09/20/2015 07/26/2015  Does Patient Have a Medical Advance Directive? No No No No No No No  Would patient like information on creating a medical advance directive? No - Patient declined No - Patient declined - - - - -    Tobacco Social History   Tobacco Use  Smoking Status Never Smoker  Smokeless Tobacco Never Used     Counseling given: Not Answered   Clinical Intake:  Pre-visit preparation completed: Yes  Pain : 0-10 Pain Score: 3  Pain Type: Chronic pain Pain Location: Back Pain Descriptors / Indicators: Aching Pain Onset: More than a month ago Pain Frequency: Constant Pain Relieving Factors: tylenol, ibuprofen Effect of Pain on Daily Activities: she paces herself  Pain Relieving Factors: tylenol, ibuprofen  Nutritional Status: BMI > 30  Obese Diabetes: Yes(Followed by pcp)  How often do you need to have someone help you when you read instructions, pamphlets, or other written materials from your doctor or pharmacy?: 1 - Never  Interpreter Needed?: No     Past Medical History:  Diagnosis Date  . Arthritis   . Cardiac arrhythmia due to congenital heart disease   . Chicken pox   . Depression   . Headache, frequent episodic tension-type   . High cholesterol   . History of high blood pressure    readings    . Holter monitor, abnormal August 2012   done for long QT.  some contractile asynchrony [name])  . Hx of colonoscopy sept 2012   normal,  next due 2022, [name]  . Hypertension   . Migraines   . Morton's neuroma    bilateral  . Syncope    Past Surgical History:  Procedure Laterality Date  . ABDOMINAL HYSTERECTOMY  Dec 2013   [place]  . APPENDECTOMY  1973  . BI-VENTRICULAR PACEMAKER INSERTION (CRT-P)  05/2013   MDT CRTP implanted by Dr Lovena Le for cardiomyopathy, LBBB, and syncope  . HYSTEROSCOPY  2005   heavy bleeding  . IMPLANTABLE CARDIOVERTER DEFIBRILLATOR IMPLANT N/A 06/02/2013   Procedure: IMPLANTABLE CARDIOVERTER DEFIBRILLATOR IMPLANT;  Surgeon: Evans Lance, MD;  Location: [facility] CATH LAB;  Service: Cardiovascular;  Laterality: N/A;  . SPINAL FUSION  03/2007   ruptured disc  L5  [name]  . TONSILLECTOMY AND ADENOIDECTOMY  1966   Family History  Problem Relation Age of Onset  . Arthritis Mother   . Hyperlipidemia Mother   . Cirrhosis Mother   . Arthritis Father   . Cancer Father        prostate cancer   . Hyperlipidemia Father   . Pulmonary embolism Father 34  . Alzheimer's disease Father   . Cancer Other        ovarian,uterus  . Heart disease Other   . Stroke Other   .  Learning disabilities Other   . Coronary artery disease Paternal Grandfather 81  . Diabetes Brother   . Diabetes Maternal Uncle    Social History   Socioeconomic History  . Marital status: Married    Spouse name: Not on file  . Number of children: Not on file  . Years of education: Not on file  . Highest education level: Not on file  Occupational History  . Not on file  Social Needs  . Financial resource strain: Not hard at all  . Food insecurity:    Worry: Never true    Inability: Never true  . Transportation needs:    Medical: No    Non-medical: No  Tobacco Use  . Smoking status: Never Smoker  . Smokeless tobacco: Never Used  Substance and Sexual Activity  . Alcohol use: No   . Drug use: No  . Sexual activity: Yes  Lifestyle  . Physical activity:    Days per week: 3 days    Minutes per session: 40 min  . Stress: Only a little  Relationships  . Social connections:    Talks on phone: Not on file    Gets together: Not on file    Attends religious service: Not on file    Active member of club or organization: Not on file    Attends meetings of clubs or organizations: Not on file    Relationship status: Not on file  Other Topics Concern  . Not on file  Social History Narrative  . Not on file    Outpatient Encounter Medications as of 12/16/2017  Medication Sig  . Azelaic Acid (FINACEA EX) Apply 1 application topically 2 (two) times daily.  . B Complex Vitamins (B COMPLEX PO) Take 1 tablet by mouth daily.   . calcium elemental as carbonate (BARIATRIC TUMS ULTRA) 400 MG chewable tablet Chew by mouth.  . Cholecalciferol (VITAMIN D3) 2000 UNITS TABS Take 1 tablet by mouth daily.  Marland Kitchen esomeprazole (NEXIUM) 20 MG capsule Take 10 mg by mouth daily at 12 noon.   . fluticasone (FLONASE) 50 MCG/ACT nasal spray Place 2 sprays into both nostrils daily.  . furosemide (LASIX) 20 MG tablet Take 1 tablet (20 mg total) by mouth daily. As needed for fluid retention  . losartan-hydrochlorothiazide (HYZAAR) 50-12.5 MG tablet TAKE 1 TABLET BY MOUTH EVERY DAY (Patient taking differently: Take 0.5 tablets by mouth daily. )  . methocarbamol (ROBAXIN) 500 MG tablet LIMIT 1 TABLET BY MOUTH PER DAY OR 2-3 TIMES PER DAY AS NEEDED  . metoprolol tartrate (LOPRESSOR) 25 MG tablet Take 12.5 mg by mouth 2 (two) times daily. Reported on 05/17/2015  . Multiple Vitamin (MULTIVITAMIN) tablet Take 1 tablet by mouth daily.    . sertraline (ZOLOFT) 100 MG tablet TAKE 1 TABLET BY MOUTH EVERY DAY  . spironolactone (ALDACTONE) 25 MG tablet Take 25 mg by mouth daily.  Marland Kitchen tolterodine (DETROL) 2 MG tablet TAKE 1 TABLET TWICE A DAY  . [DISCONTINUED] baclofen (LIORESAL) 10 MG tablet Take 10 mg by mouth 2  (two) times daily.  . [DISCONTINUED] predniSONE (DELTASONE) 10 MG tablet 6 tablets on Day 1 , then reduce by 1 tablet daily until gone (Patient not taking: Reported on 12/16/2017)   Facility-Administered Encounter Medications as of 12/16/2017  Medication  . diclofenac sodium (VOLTAREN) 1 % transdermal gel 4 g  . midazolam (VERSED) 5 MG/5ML injection 5 mg  . orphenadrine (NORFLEX) injection 60 mg  . sodium chloride 0.9 % injection 20 mL  .  triamcinolone acetonide (KENALOG-40) injection 40 mg    Activities of Daily Living In your present state of health, do you have any difficulty performing the following activities: 12/16/2017  Hearing? N  Vision? N  Difficulty concentrating or making decisions? N  Walking or climbing stairs? N  Dressing or bathing? N  Doing errands, shopping? N  Preparing Food and eating ? N  Using the Toilet? N  In the past six months, have you accidently leaked urine? Y  Comment Managed with daily brief/pad  Do you have problems with loss of bowel control? N  Managing your Medications? N  Managing your Finances? N  Housekeeping or managing your Housekeeping? N  Some recent data might be hidden    Patient Care Team: Crecencio Mc, MD as PCP - General (Internal Medicine)    Assessment:   This is a routine wellness examination for Ercilia.  The goal of the wellness visit is to assist the patient how to close the gaps in care and create a preventative care plan for the patient.   The roster of all physicians providing medical care to patient is listed in the Snapshot section of the chart.  Taking calcium VIT D as appropriate/Osteoporosis risk reviewed.    Safety issues reviewed; Smoke and carbon monoxide detectors in the home. Firearm safety discussed. Wears seatbelts when driving or riding with others. No violence in the home.  They do not have excessive sun exposure.  Discussed the need for sun protection: hats, long sleeves and the use of sunscreen if  there is significant sun exposure.  Patient is alert, normal appearance, oriented to person/place/and time.  Correctly identified the president of the [place] and recalls of 3/3 words. Performs simple calculations and can read correct time from watch face.  Displays appropriate judgement.  No new identified risk were noted.  No failures at ADL's or IADL's.  BMI- discussed the importance of a healthy diet, water intake and the benefits of aerobic exercise. Educational material provided.   24 hour diet recall: Regular diet  Dental- every 6 months. Dr. Benna Dunks.  Influenza vaccine discussed.  She plans to receive later in the season.   Patient Concerns: Follow up with pcp regarding worsening back pain, joint stiffness and anxiety. Same day appointment.   Exercise Activities and Dietary recommendations Current Exercise Habits: Structured exercise class, Type of exercise: calisthenics(Swimming ), Time (Minutes): 45, Frequency (Times/Week): 3, Weekly Exercise (Minutes/Week): 135  Goals    . Increase physical activity     Swim for exercise       Fall Risk Fall Risk  12/16/2017 12/15/2016 12/31/2015 11/27/2015 10/26/2015  Falls in the past year? No No No No No  Comment - - - - -  Number falls in past yr: - - - - -  Injury with Fall? - - - - -  Risk for fall due to : - - - - -  Risk for fall due to: Comment - - - - -  Follow up - - - - -   Depression Screen PHQ 2/9 Scores 12/16/2017 12/15/2016 11/27/2015 09/20/2015  PHQ - 2 Score 0 0 0 0  PHQ- 9 Score - 0 - -  Exception Documentation - - - -     Cognitive Function MMSE - Mini Mental State Exam 12/16/2017 12/15/2016  Orientation to time 5 5  Orientation to Place 5 5  Registration 3 3  Attention/ Calculation 5 5  Recall 3 3  Language- name 2 objects 2  2  Language- repeat 1 1  Language- follow 3 step command 3 3  Language- read & follow direction 1 1  Write a sentence 1 1  Copy design 1 1  Total score 30 30        Immunization  History  Administered Date(s) Administered  . Influenza Split 03/28/2014  . Influenza,inj,Quad PF,6+ Mos 02/07/2013, 04/11/2015, 03/06/2016, 03/18/2017  . Pneumococcal Conjugate-13 06/28/2014  . Pneumococcal Polysaccharide-23 05/11/2013  . Tdap 02/08/2008   Screening Tests Health Maintenance  Topic Date Due  . INFLUENZA VACCINE  11/19/2017  . TETANUS/TDAP  02/07/2018  . HEMOGLOBIN A1C  06/13/2018  . OPHTHALMOLOGY EXAM  08/21/2018  . FOOT EXAM  08/26/2018  . MAMMOGRAM  08/28/2018  . COLONOSCOPY  01/08/2021  . PNEUMOCOCCAL POLYSACCHARIDE VACCINE AGE 68-64 HIGH RISK  Completed  . Hepatitis C Screening  Completed  . HIV Screening  Completed  . PAP SMEAR  Discontinued      Plan:   End of life planning; Advanced aging; Advanced directives discussed.  No HCPOA/Living Will.  Additional information declined at this time.  I have personally reviewed and noted the following in the patient's chart:   . Medical and social history . Use of alcohol, tobacco or illicit drugs  . Current medications and supplements . Functional ability and status . Nutritional status . Physical activity . Advanced directives . List of other physicians . Hospitalizations, surgeries, and ER visits in previous 12 months . Vitals . Screenings to include cognitive, depression, and falls . Referrals and appointments  In addition, I have reviewed and discussed with patient certain preventive protocols, quality metrics, and best practice recommendations. A written personalized care plan for preventive services as well as general preventive health recommendations were provided to patient.     Varney Biles, LPN  8/50/[id number]

## 2017-12-16 NOTE — Patient Instructions (Addendum)
I am recommending that you start taking Januvia to lower your blood sugars gently   YOU HAVE NOT BEEN ANEMIC FOR OVER 5 YEARS!     Sitagliptin oral tablet What is this medicine? SITAGLIPTIN (sit a GLIP tin) helps to treat type 2 diabetes. It helps to control blood sugar. Treatment is combined with diet and exercise. This medicine may be used for other purposes; ask your health care provider or pharmacist if you have questions. COMMON BRAND NAME(S): Januvia What should I tell my health care provider before I take this medicine? They need to know if you have any of these conditions: -diabetic ketoacidosis -kidney disease -pancreatitis -previous swelling of the tongue, face, or lips with difficulty breathing, difficulty swallowing, hoarseness, or tightening of the throat -type 1 diabetes -an unusual or allergic reaction to sitagliptin, other medicines, foods, dyes, or preservatives -pregnant or trying to get pregnant -breast-feeding How should I use this medicine? Take this medicine by mouth with a glass of water. Follow the directions on the prescription label. You can take it with or without food. Do not cut, crush or chew this medicine. Take your dose at the same time each day. Do not take more often than directed. Do not stop taking except on your doctor's advice. A special MedGuide will be given to you by the pharmacist with each prescription and refill. Be sure to read this information carefully each time. Talk to your pediatrician regarding the use of this medicine in children. Special care may be needed. Overdosage: If you think you have taken too much of this medicine contact a poison control center or emergency room at once. NOTE: This medicine is only for you. Do not share this medicine with others. What if I miss a dose? If you miss a dose, take it as soon as you can. If it is almost time for your next dose, take only that dose. Do not take double or extra doses. What may  interact with this medicine? Do not take this medicine with any of the following medications: -gatifloxacin This medicine may also interact with the following medications: -alcohol -digoxin -insulin -sulfonylureas like glimepiride, glipizide, glyburide This list may not describe all possible interactions. Give your health care provider a list of all the medicines, herbs, non-prescription drugs, or dietary supplements you use. Also tell them if you smoke, drink alcohol, or use illegal drugs. Some items may interact with your medicine. What should I watch for while using this medicine? Visit your doctor or health care professional for regular checks on your progress. A test called the HbA1C (A1C) will be monitored. This is a simple blood test. It measures your blood sugar control over the last 2 to 3 months. You will receive this test every 3 to 6 months. Learn how to check your blood sugar. Learn the symptoms of low and high blood sugar and how to manage them. Always carry a quick-source of sugar with you in case you have symptoms of low blood sugar. Examples include hard sugar candy or glucose tablets. Make sure others know that you can choke if you eat or drink when you develop serious symptoms of low blood sugar, such as seizures or unconsciousness. They must get medical help at once. Tell your doctor or health care professional if you have high blood sugar. You might need to change the dose of your medicine. If you are sick or exercising more than usual, you might need to change the dose of your medicine. Do not  skip meals. Ask your doctor or health care professional if you should avoid alcohol. Many nonprescription cough and cold products contain sugar or alcohol. These can affect blood sugar. Wear a medical ID bracelet or chain, and carry a card that describes your disease and details of your medicine and dosage times. What side effects may I notice from receiving this medicine? Side effects  that you should report to your doctor or health care professional as soon as possible: -allergic reactions like skin rash, itching or hives, swelling of the face, lips, or tongue -breathing problems -general ill feeling or flu-like symptoms -joint pain -loss of appetite -redness, blistering, peeling or loosening of the skin, including inside the mouth -signs and symptoms of low blood sugar such as feeling anxious, confusion, dizziness, increased hunger, unusually weak or tired, sweating, shakiness, cold, irritable, headache, blurred vision, fast heartbeat, loss of consciousness -unusual stomach pain or discomfort -vomiting Side effects that usually do not require medical attention (report to your doctor or health care professional if they continue or are bothersome): -diarrhea -headache -sore throat -stomach upset -stuffy or runny nose This list may not describe all possible side effects. Call your doctor for medical advice about side effects. You may report side effects to FDA at 1-800-FDA-1088. Where should I keep my medicine? Keep out of the reach of children. Store at room temperature between 15 and 30 degrees C (59 and 86 degrees F). Throw away any unused medicine after the expiration date. NOTE: This sheet is a summary. It may not cover all possible information. If you have questions about this medicine, talk to your doctor, pharmacist, or health care provider.  2018 Elsevier/Gold Standard (2015-10-05 14:23:55)

## 2017-12-16 NOTE — Progress Notes (Signed)
Subjective:  Patient ID: Julia Pierce, female    DOB: 1956-04-11  Age: 62 y.o. MRN: [id number]  CC: Diagnoses of Diabetes mellitus type 2, diet-controlled ([place]), Chronic systolic heart failure ([place]), Mixed hypertriglyceridemia, and Essential hypertension were pertinent to this visit.  HPI [name] presents for 3 month follow up on diabetes.  Slight loss of control noted on recent labs,  a1c now 7.5   Patient  Is frustrated because she has been  eating better since last visit and expected her a1c to drop.  Diet reviewed:   No  junk food.  Doesn't eat enough vegetables.  Patient has multiple pain  complaints today.  Patient is following a low glycemic index diet  But not swimming regularly anymore or exercising as much due to pain and continued entanglement with estate issues.  Cites son's marital conflict as a source of anxiety . Due tosher  effect on grandchildren.    Fasting sugars have been under less than 145 most of the time and post prandials have been under 160 except on rare occasions. .  Patient has had an eye exam in the last 12 months and checks feet regularly for signs of infection.  Patient does not walk barefoot outside,  And denies an numbness tingling or burning in feet. Patient is up to date on all recommended vaccinations  Lipids , blood pressure,  EF evaluated by cardiology in June with TTE.   EF 55%   No treatment planned due to risk being intermediate  During preoperative evaluation . Had her debrillator interrogated  .    Right sided > left wrist/arm pain.   Carpal tunnel surgery by Trenton Gammon  Has been postponed due to having to deal with estate of recently deceased family members.   Not swimming as much, notes that her back , legs and arms hurt more after swimming or after prolonged physical labor.  Outpatient Medications Prior to Visit  Medication Sig Dispense Refill  . Azelaic Acid (FINACEA EX) Apply 1 application topically 2 (two) times daily.    . B Complex Vitamins (B  COMPLEX PO) Take 1 tablet by mouth daily.     . calcium elemental as carbonate (BARIATRIC TUMS ULTRA) 400 MG chewable tablet Chew by mouth.    . Cholecalciferol (VITAMIN D3) 2000 UNITS TABS Take 1 tablet by mouth daily.    Marland Kitchen esomeprazole (NEXIUM) 20 MG capsule Take 10 mg by mouth daily at 12 noon.     . fluticasone (FLONASE) 50 MCG/ACT nasal spray Place 2 sprays into both nostrils daily.    . furosemide (LASIX) 20 MG tablet Take 1 tablet (20 mg total) by mouth daily. As needed for fluid retention 30 tablet 0  . losartan-hydrochlorothiazide (HYZAAR) 50-12.5 MG tablet TAKE 1 TABLET BY MOUTH EVERY DAY (Patient taking differently: Take 0.5 tablets by mouth daily. ) 90 tablet 1  . methocarbamol (ROBAXIN) 500 MG tablet LIMIT 1 TABLET BY MOUTH PER DAY OR 2-3 TIMES PER DAY AS NEEDED 90 tablet 5  . metoprolol tartrate (LOPRESSOR) 25 MG tablet Take 12.5 mg by mouth 2 (two) times daily. Reported on 05/17/2015    . Multiple Vitamin (MULTIVITAMIN) tablet Take 1 tablet by mouth daily.      . sertraline (ZOLOFT) 100 MG tablet TAKE 1 TABLET BY MOUTH EVERY DAY 90 tablet 0  . spironolactone (ALDACTONE) 25 MG tablet Take 25 mg by mouth daily.    Marland Kitchen tolterodine (DETROL) 2 MG tablet TAKE 1 TABLET TWICE A DAY 180 tablet  4  . baclofen (LIORESAL) 10 MG tablet Take 10 mg by mouth 2 (two) times daily.    . predniSONE (DELTASONE) 10 MG tablet 6 tablets on Day 1 , then reduce by 1 tablet daily until gone (Patient not taking: Reported on 12/16/2017) 21 tablet 0   Facility-Administered Medications Prior to Visit  Medication Dose Route Frequency Provider Last Rate Last Dose  . diclofenac sodium (VOLTAREN) 1 % transdermal gel 4 g  4 g Topical QID Mohammed Kindle, MD      . midazolam (VERSED) 5 MG/5ML injection 5 mg  5 mg Intravenous Once Mohammed Kindle, MD      . orphenadrine (NORFLEX) injection 60 mg  60 mg Intramuscular Once Mohammed Kindle, MD      . sodium chloride 0.9 % injection 20 mL  20 mL Other Once Mohammed Kindle, MD        . triamcinolone acetonide (KENALOG-40) injection 40 mg  40 mg Other Once Mohammed Kindle, MD        Review of Systems;  Patient denies headache, fevers, malaise, unintentional weight loss, skin rash, eye pain, sinus congestion and sinus pain, sore throat, dysphagia,  hemoptysis , cough, dyspnea, wheezing, chest pain, palpitations, orthopnea, edema, abdominal pain, nausea, melena, diarrhea, constipation, flank pain, dysuria, hematuria, urinary  Frequency, nocturia, numbness, tingling, seizures,  Focal weakness, Loss of consciousness,  Tremor, insomnia, depression, anxiety, and suicidal ideation.      Objective:  BP 128/82 (BP Location: Left Arm, Patient Position: Sitting, Cuff Size: Normal)   Pulse 72   Temp 98.4 F (36.9 C) (Oral)   Resp 13   Ht 5' 2.5" (1.588 m)   Wt 176 lb (79.8 kg)   SpO2 97%   BMI 31.68 kg/m   BP Readings from Last 3 Encounters:  12/16/17 128/82  12/16/17 128/82  08/25/17 128/78    Wt Readings from Last 3 Encounters:  12/16/17 176 lb (79.8 kg)  12/16/17 176 lb (79.8 kg)  08/25/17 175 lb 9.6 oz (79.7 kg)    General appearance: alert, cooperative and appears stated age Ears: normal TM's and external ear canals both ears Throat: lips, mucosa, and tongue normal; teeth and gums normal Neck: no adenopathy, no carotid bruit, supple, symmetrical, trachea midline and thyroid not enlarged, symmetric, no tenderness/mass/nodules Back: symmetric, no curvature. ROM normal. No CVA tenderness. Lungs: clear to auscultation bilaterally Heart: regular rate and rhythm, S1, S2 normal, no murmur, click, rub or gallop Abdomen: soft, non-tender; bowel sounds normal; no masses,  no organomegaly Pulses: 2+ and symmetric Skin: Skin color, texture, turgor normal. No rashes or lesions Lymph nodes: Cervical, supraclavicular, and axillary nodes normal.  Lab Results  Component Value Date   HGBA1C 7.5 (A) 12/11/2017   HGBA1C 7.2 (H) 08/21/2017   HGBA1C 6.9 (H) 03/16/2017     Lab Results  Component Value Date   CREATININE 0.68 12/11/2017   CREATININE 0.66 08/21/2017   CREATININE 0.71 03/16/2017    Lab Results  Component Value Date   WBC 8.0 09/11/2016   HGB 13.6 09/11/2016   HCT 40.8 09/11/2016   PLT 257.0 09/11/2016   GLUCOSE 161 (H) 12/11/2017   CHOL 219 (H) 12/11/2017   TRIG 343.0 (H) 12/11/2017   HDL 37.90 (L) 12/11/2017   LDLDIRECT 135.0 12/11/2017   LDLCALC 88 08/17/2013   ALT 17 12/11/2017   AST 18 12/11/2017   NA 139 12/11/2017   K 4.3 12/11/2017   CL 101 12/11/2017   CREATININE 0.68 12/11/2017   BUN 14  12/11/2017   CO2 28 12/11/2017   TSH 3.80 12/12/2016   HGBA1C 7.5 (A) 12/11/2017   MICROALBUR 0.7 06/09/2016    Ct Cervical Spine Wo Contrast  Result Date: 12/31/2016 CLINICAL DATA:  Bilateral hand numbness and weakness, worse on the left. Cervical radiculopathy. EXAM: CT CERVICAL SPINE WITHOUT CONTRAST TECHNIQUE: Multidetector CT imaging of the cervical spine was performed without intravenous contrast. Multiplanar CT image reconstructions were also generated. COMPARISON:  MRI of cervical spine 10/18/2012 FINDINGS: Alignment: This AP alignment is anatomic. There straightening of the normal cervical lordosis, similar the prior study. Skull base and vertebrae: The craniocervical junction is normal. Vertebral body heights are maintained. Endplate Schmorl's nodes are stable. Soft tissues and spinal canal: The soft tissues the neck demonstrate atherosclerotic calcifications at the aortic arch. 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 No significant adenopathy is present. The visualized salivary glands are within normal limits. Disc levels:  C2-3:  Negative. C3-4:  Negative. C4-5: Asymmetric left-sided uncovertebral spurring is noted. Mild left foraminal stenosis is stable. C5-6: A broad-based disc osteophyte complex present. Uncovertebral spurring is worse on the left. Moderate central and severe left foraminal stenosis is stable. C6-7: A broad-based disc  osteophyte complex is present. There is progressive effacement of the ventral CSF. Moderate left and mild right foraminal stenosis is stable. C7-T1:  Negative. Upper chest: The lung apices are clear. IMPRESSION: 1. Mild left foraminal narrowing at C3-4 is stable. 2. Moderate central and severe left foraminal stenosis at C5-6 is similar the prior exam. 3. Moderate left and mild right foraminal stenosis at C6-7 is similar the prior study. 4. Progressive central in left central canal stenosis at C6-7. Electronically Signed   By: San Morelle M.D.   On: 12/31/2016 15:04    Assessment & Plan:   Problem List Items Addressed This Visit    Chronic systolic heart failure ([place])    Managed medically. No edema on exam .  weight is stable. Currently asymptomatic   Continue current medications.  avoid salt.  Encouraged to resume regular exercise         Diabetes mellitus type 2, diet-controlled ([place])    Adding Januvia for loss of control due to diet and lack of exercise.       Relevant Medications   sitaGLIPtin (JANUVIA) 50 MG tablet   Hypertension    Well controlled on current regimen. which includes reduce dose of losartan to 1/2 tablet.    Renal function stable, no changes today.  Lab Results  Component Value Date   CREATININE 0.68 12/11/2017   Lab Results  Component Value Date   NA 139 12/11/2017   K 4.3 12/11/2017   CL 101 12/11/2017   CO2 28 12/11/2017   Lab Results  Component Value Date   MICROALBUR 0.7 06/09/2016         Mixed hypertriglyceridemia    Untreated due to estimated risk being intermediate  Lab Results  Component Value Date   CHOL 219 (H) 12/11/2017   HDL 37.90 (L) 12/11/2017   LDLCALC 88 08/17/2013   LDLDIRECT 135.0 12/11/2017   TRIG 343.0 (H) 12/11/2017   CHOLHDL 6 12/11/2017           A total of 25 minutes of face to face time was spent with patient more than half of which was spent in counselling about the above mentioned conditions  and  coordination of care    I have discontinued Janiqua Friscia "Susie"'s baclofen and predniSONE. I am also having her start on sitaGLIPtin.  Additionally, I am having her maintain her multivitamin, spironolactone, B Complex Vitamins (B COMPLEX PO), Azelaic Acid (FINACEA EX), Vitamin D3, esomeprazole, fluticasone, metoprolol tartrate, furosemide, calcium elemental as carbonate, losartan-hydrochlorothiazide, methocarbamol, tolterodine, and sertraline. We will continue to administer midazolam, orphenadrine, sodium chloride, triamcinolone acetonide, and diclofenac sodium.  Meds ordered this encounter  Medications  . sitaGLIPtin (JANUVIA) 50 MG tablet    Sig: Take 1 tablet (50 mg total) by mouth daily.    Dispense:  30 tablet    Refill:  5    Medications Discontinued During This Encounter  Medication Reason  . baclofen (LIORESAL) 10 MG tablet Patient has not taken in last 30 days  . predniSONE (DELTASONE) 10 MG tablet Completed Course    Follow-up: Return in about 3 months (around 03/18/2018) for follow up diabetes.   Crecencio Mc, MD

## 2017-12-16 NOTE — Patient Instructions (Addendum)
  Julia Pierce , Thank you for taking time to come for your Medicare Wellness Visit. I appreciate your ongoing commitment to your health goals. Please review the following plan we discussed and let me know if I can assist you in the future.   These are the goals we discussed: Goals    . Increase physical activity     Swim for exercise       This is a list of the screening recommended for you and due dates:  Health Maintenance  Topic Date Due  . Flu Shot  11/19/2017  . Tetanus Vaccine  02/07/2018  . Hemoglobin A1C  06/13/2018  . Eye exam for diabetics  08/21/2018  . Complete foot exam   08/26/2018  . Mammogram  08/28/2018  . Colon Cancer Screening  01/08/2021  . Pneumococcal vaccine  Completed  .  Hepatitis C: One time screening is recommended by Center for Disease Control  (CDC) for  adults born from [birth year] through 1966.   Completed  . HIV Screening  Completed  . Pap Smear  Discontinued

## 2017-12-17 ENCOUNTER — Ambulatory Visit (INDEPENDENT_AMBULATORY_CARE_PROVIDER_SITE_OTHER): Payer: BLUE CROSS/BLUE SHIELD | Admitting: *Deleted

## 2017-12-17 DIAGNOSIS — I428 Other cardiomyopathies: Secondary | ICD-10-CM

## 2017-12-17 NOTE — Progress Notes (Signed)
Remote pacemaker transmission.   

## 2017-12-19 NOTE — Assessment & Plan Note (Signed)
Untreated due to estimated risk being intermediate  Lab Results  Component Value Date   CHOL 219 (H) 12/11/2017   HDL 37.90 (L) 12/11/2017   LDLCALC 88 08/17/2013   LDLDIRECT 135.0 12/11/2017   TRIG 343.0 (H) 12/11/2017   CHOLHDL 6 12/11/2017    

## 2017-12-19 NOTE — Assessment & Plan Note (Signed)
Adding Januvia for loss of control due to diet and lack of exercise.

## 2017-12-19 NOTE — Assessment & Plan Note (Signed)
Well controlled on current regimen. which includes reduce dose of losartan to 1/2 tablet.    Renal function stable, no changes today.  Lab Results  Component Value Date   CREATININE 0.68 12/11/2017   Lab Results  Component Value Date   NA 139 12/11/2017   K 4.3 12/11/2017   CL 101 12/11/2017   CO2 28 12/11/2017   Lab Results  Component Value Date   MICROALBUR 0.7 06/09/2016

## 2017-12-19 NOTE — Assessment & Plan Note (Signed)
Managed medically. No edema on exam .  weight is stable. Currently asymptomatic   Continue current medications.  avoid salt.  Encouraged to resume regular exercise

## 2017-12-22 NOTE — Progress Notes (Signed)
  I have reviewed the above information and agree with above.   Somaya Staphanie Harbison, MD 

## 2017-12-30 DIAGNOSIS — L718 Other rosacea: Secondary | ICD-10-CM | POA: Diagnosis not present

## 2017-12-30 DIAGNOSIS — Z872 Personal history of diseases of the skin and subcutaneous tissue: Secondary | ICD-10-CM | POA: Diagnosis not present

## 2017-12-30 DIAGNOSIS — Z1283 Encounter for screening for malignant neoplasm of skin: Secondary | ICD-10-CM | POA: Diagnosis not present

## 2017-12-30 DIAGNOSIS — L578 Other skin changes due to chronic exposure to nonionizing radiation: Secondary | ICD-10-CM | POA: Diagnosis not present

## 2018-01-04 ENCOUNTER — Encounter: Payer: Self-pay | Admitting: Internal Medicine

## 2018-01-04 ENCOUNTER — Ambulatory Visit (INDEPENDENT_AMBULATORY_CARE_PROVIDER_SITE_OTHER): Payer: BLUE CROSS/BLUE SHIELD | Admitting: Internal Medicine

## 2018-01-04 VITALS — BP 130/74 | HR 88 | Temp 98.3°F | Resp 16 | Ht 62.5 in | Wt 172.6 lb

## 2018-01-04 DIAGNOSIS — N3000 Acute cystitis without hematuria: Secondary | ICD-10-CM

## 2018-01-04 DIAGNOSIS — R300 Dysuria: Secondary | ICD-10-CM

## 2018-01-04 LAB — POCT URINALYSIS DIPSTICK
Glucose, UA: NEGATIVE
Nitrite, UA: POSITIVE
PH UA: 7 (ref 5.0–8.0)
Protein, UA: POSITIVE — AB
SPEC GRAV UA: 1.02 (ref 1.010–1.025)
UROBILINOGEN UA: 1 U/dL

## 2018-01-04 LAB — URINALYSIS, MICROSCOPIC ONLY

## 2018-01-04 MED ORDER — SULFAMETHOXAZOLE-TRIMETHOPRIM 800-160 MG PO TABS: 1.0000 | ORAL_TABLET | Freq: Two times a day (BID) | ORAL | 0 refills | Status: DC

## 2018-01-04 NOTE — Patient Instructions (Addendum)
  I am treating  you empirically for a  Urinary tract infection  With Septra DS .  please take it twice daily for  At least 3 days. If the culture suggests that  An alternative antibiotic is neededd we will change it.  We will let you know either way    Please take a probiotic ( Align, Flora que or [employer]) for 2 weeks if you start the antibiotic to prevent a serious antibiotic associated diarrhea  Called" clostridium difficile colitis" ( should also help prevent   vaginal yeast infection)

## 2018-01-04 NOTE — Assessment & Plan Note (Signed)
Empiric treatment with Septra DS .  She is advised that pending culture results, she  may need alternative

## 2018-01-04 NOTE — Progress Notes (Signed)
Subjective:  Patient ID: Julia Pierce, female    DOB: Mar 10, 1956  Age: 62 y.o. MRN: [id number]  CC: The primary encounter diagnosis was Dysuria. A diagnosis of Acute cystitis without hematuria was also pertinent to this visit.  HPI Julia Pierce presents for sudden onset of urinary urgency, mild dysuria  and frequency that started yesterday morning.  Diarrhea started at the same time,  Although  it is questionable whether the diarrhea is acute or chornic since her sugery.  She notes small caliber stools accompanied  by fluid component   Has not started Januvia.  Has not been on antibiotics  In months.   Has been cleaning a  Family house that had been neglected for months and contained dog feces,  But denies contact with feces,  States that she wore rubber  goves when she cleaned the refrigerator,  Did not eat in the house.   Outpatient Medications Prior to Visit  Medication Sig Dispense Refill  . Azelaic Acid (FINACEA EX) Apply 1 application topically 2 (two) times daily.    . B Complex Vitamins (B COMPLEX PO) Take 1 tablet by mouth daily.     . calcium elemental as carbonate (BARIATRIC TUMS ULTRA) 400 MG chewable tablet Chew by mouth.    . Cholecalciferol (VITAMIN D3) 2000 UNITS TABS Take 1 tablet by mouth daily.    Marland Kitchen esomeprazole (NEXIUM) 20 MG capsule Take 10 mg by mouth daily at 12 noon.     . fluticasone (FLONASE) 50 MCG/ACT nasal spray Place 2 sprays into both nostrils daily.    . furosemide (LASIX) 20 MG tablet Take 1 tablet (20 mg total) by mouth daily. As needed for fluid retention 30 tablet 0  . losartan-hydrochlorothiazide (HYZAAR) 50-12.5 MG tablet TAKE 1 TABLET BY MOUTH EVERY DAY (Patient taking differently: Take 0.5 tablets by mouth daily. ) 90 tablet 1  . methocarbamol (ROBAXIN) 500 MG tablet LIMIT 1 TABLET BY MOUTH PER DAY OR 2-3 TIMES PER DAY AS NEEDED 90 tablet 5  . metoprolol tartrate (LOPRESSOR) 25 MG tablet Take 12.5 mg by mouth 2 (two) times daily. Reported on 05/17/2015     . Multiple Vitamin (MULTIVITAMIN) tablet Take 1 tablet by mouth daily.      . sertraline (ZOLOFT) 100 MG tablet TAKE 1 TABLET BY MOUTH EVERY DAY 90 tablet 0  . sitaGLIPtin (JANUVIA) 50 MG tablet Take 1 tablet (50 mg total) by mouth daily. 30 tablet 5  . spironolactone (ALDACTONE) 25 MG tablet Take 25 mg by mouth daily.    Marland Kitchen tolterodine (DETROL) 2 MG tablet TAKE 1 TABLET TWICE A DAY 180 tablet 4  . triamcinolone cream (KENALOG) 0.1 %      Facility-Administered Medications Prior to Visit  Medication Dose Route Frequency Provider Last Rate Last Dose  . diclofenac sodium (VOLTAREN) 1 % transdermal gel 4 g  4 g Topical QID Mohammed Kindle, MD      . midazolam (VERSED) 5 MG/5ML injection 5 mg  5 mg Intravenous Once Mohammed Kindle, MD      . orphenadrine (NORFLEX) injection 60 mg  60 mg Intramuscular Once Mohammed Kindle, MD      . sodium chloride 0.9 % injection 20 mL  20 mL Other Once Mohammed Kindle, MD      . triamcinolone acetonide (KENALOG-40) injection 40 mg  40 mg Other Once Mohammed Kindle, MD        Review of Systems;  Patient denies headache, fevers, malaise, unintentional weight loss, skin rash, eye pain,  sinus congestion and sinus pain, sore throat, dysphagia,  hemoptysis , cough, dyspnea, wheezing, chest pain, palpitations, orthopnea, edema, abdominal pain, nausea, melena, diarrhea, constipation, flank pain, dysuria, hematuria, urinary  Frequency, nocturia, numbness, tingling, seizures,  Focal weakness, Loss of consciousness,  Tremor, insomnia, depression, anxiety, and suicidal ideation.      Objective:  BP 130/74 (BP Location: Left Arm, Patient Position: Sitting, Cuff Size: Normal)   Pulse 88   Temp 98.3 F (36.8 C) (Oral)   Resp 16   Ht 5' 2.5" (1.588 m)   Wt 172 lb 9.6 oz (78.3 kg)   SpO2 97%   BMI 31.07 kg/m   BP Readings from Last 3 Encounters:  01/04/18 130/74  12/16/17 128/82  12/16/17 128/82    Wt Readings from Last 3 Encounters:  01/04/18 172 lb 9.6 oz (78.3  kg)  12/16/17 176 lb (79.8 kg)  12/16/17 176 lb (79.8 kg)    General appearance: alert, cooperative and appears stated age Ears: normal TM's and external ear canals both ears Throat: lips, mucosa, and tongue normal; teeth and gums normal Neck: no adenopathy, no carotid bruit, supple, symmetrical, trachea midline and thyroid not enlarged, symmetric, no tenderness/mass/nodules Back: symmetric, no curvature. ROM normal. No CVA tenderness. Lungs: clear to auscultation bilaterally Heart: regular rate and rhythm, S1, S2 normal, no murmur, click, rub or gallop Abdomen: soft, non-tender; bowel sounds normal; no masses,  no organomegaly Pulses: 2+ and symmetric Skin: Skin color, texture, turgor normal. No rashes or lesions Lymph nodes: Cervical, supraclavicular, and axillary nodes normal.  Lab Results  Component Value Date   HGBA1C 7.5 (A) 12/11/2017   HGBA1C 7.2 (H) 08/21/2017   HGBA1C 6.9 (H) 03/16/2017    Lab Results  Component Value Date   CREATININE 0.68 12/11/2017   CREATININE 0.66 08/21/2017   CREATININE 0.71 03/16/2017    Lab Results  Component Value Date   WBC 8.0 09/11/2016   HGB 13.6 09/11/2016   HCT 40.8 09/11/2016   PLT 257.0 09/11/2016   GLUCOSE 161 (H) 12/11/2017   CHOL 219 (H) 12/11/2017   TRIG 343.0 (H) 12/11/2017   HDL 37.90 (L) 12/11/2017   LDLDIRECT 135.0 12/11/2017   LDLCALC 88 08/17/2013   ALT 17 12/11/2017   AST 18 12/11/2017   NA 139 12/11/2017   K 4.3 12/11/2017   CL 101 12/11/2017   CREATININE 0.68 12/11/2017   BUN 14 12/11/2017   CO2 28 12/11/2017   TSH 3.80 12/12/2016   HGBA1C 7.5 (A) 12/11/2017   MICROALBUR 0.7 06/09/2016    Ct Cervical Spine Wo Contrast  Result Date: 12/31/2016 CLINICAL DATA:  Bilateral hand numbness and weakness, worse on the left. Cervical radiculopathy. EXAM: CT CERVICAL SPINE WITHOUT CONTRAST TECHNIQUE: Multidetector CT imaging of the cervical spine was performed without intravenous contrast. Multiplanar CT image  reconstructions were also generated. COMPARISON:  MRI of cervical spine 10/18/2012 FINDINGS: Alignment: This AP alignment is anatomic. There straightening of the normal cervical lordosis, similar the prior study. Skull base and vertebrae: The craniocervical junction is normal. Vertebral body heights are maintained. Endplate Schmorl's nodes are stable. Soft tissues and spinal canal: The soft tissues the neck demonstrate atherosclerotic calcifications at the aortic arch. 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 No significant adenopathy is present. The visualized salivary glands are within normal limits. Disc levels:  C2-3:  Negative. C3-4:  Negative. C4-5: Asymmetric left-sided uncovertebral spurring is noted. Mild left foraminal stenosis is stable. C5-6: A broad-based disc osteophyte complex present. Uncovertebral spurring is worse on the left.  Moderate central and severe left foraminal stenosis is stable. C6-7: A broad-based disc osteophyte complex is present. There is progressive effacement of the ventral CSF. Moderate left and mild right foraminal stenosis is stable. C7-T1:  Negative. Upper chest: The lung apices are clear. IMPRESSION: 1. Mild left foraminal narrowing at C3-4 is stable. 2. Moderate central and severe left foraminal stenosis at C5-6 is similar the prior exam. 3. Moderate left and mild right foraminal stenosis at C6-7 is similar the prior study. 4. Progressive central in left central canal stenosis at C6-7. Electronically Signed   By: San Morelle M.D.   On: 12/31/2016 15:04    Assessment & Plan:   Problem List Items Addressed This Visit    Acute cystitis    Empiric treatment with Septra DS .  She is advised that pending culture results, she  may need alternative        Other Visit Diagnoses    Dysuria    -  Primary   Relevant Orders   POCT Urinalysis Dipstick (Completed)   Urine Microscopic Only   Urine Culture      I am having Julia Mallick "Susie" start on  sulfamethoxazole-trimethoprim. I am also having her maintain her multivitamin, spironolactone, B Complex Vitamins (B COMPLEX PO), Azelaic Acid (FINACEA EX), Vitamin D3, esomeprazole, fluticasone, metoprolol tartrate, furosemide, calcium elemental as carbonate, losartan-hydrochlorothiazide, methocarbamol, tolterodine, sertraline, sitaGLIPtin, and triamcinolone cream. We will continue to administer midazolam, orphenadrine, sodium chloride, triamcinolone acetonide, and diclofenac sodium.  Meds ordered this encounter  Medications  . sulfamethoxazole-trimethoprim (BACTRIM DS,SEPTRA DS) 800-160 MG tablet    Sig: Take 1 tablet by mouth 2 (two) times daily.    Dispense:  10 tablet    Refill:  0    There are no discontinued medications.  Follow-up: No follow-ups on file.   Crecencio Mc, MD

## 2018-01-05 ENCOUNTER — Telehealth: Payer: Self-pay

## 2018-01-05 NOTE — Telephone Encounter (Signed)
Copied from [place] [id number]. Topic: General - Other >> Jan 04, 2018 11:01 AM Julia Pierce A wrote: Reason for CRM: Patient called in saying it was an accident on her way to her appointment, says she was routed in another direction but is still on her way and will be there in a few minutes.

## 2018-01-06 LAB — URINE CULTURE
MICRO NUMBER:: 91107531
SPECIMEN QUALITY: ADEQUATE

## 2018-01-08 LAB — CUP PACEART REMOTE DEVICE CHECK
Battery Remaining Longevity: 42 mo
Battery Voltage: 2.98 V
Brady Statistic AP VP Percent: 0.04 %
Brady Statistic AP VS Percent: 0 %
Brady Statistic AS VP Percent: 99.67 %
Brady Statistic AS VS Percent: 0.29 %
Brady Statistic RA Percent Paced: 0.04 %
Date Time Interrogation Session: 20190829182210
Implantable Lead Implant Date: 20150212
Implantable Lead Location: 753859
Implantable Lead Location: 753860
Implantable Lead Model: 4194
Implantable Lead Model: 5076
Implantable Lead Model: 5076
Implantable Pulse Generator Implant Date: 20150212
Lead Channel Impedance Value: 361 Ohm
Lead Channel Impedance Value: 361 Ohm
Lead Channel Impedance Value: 494 Ohm
Lead Channel Impedance Value: 513 Ohm
Lead Channel Impedance Value: 570 Ohm
Lead Channel Impedance Value: 589 Ohm
Lead Channel Pacing Threshold Amplitude: 0.5 V
Lead Channel Pacing Threshold Amplitude: 0.875 V
Lead Channel Pacing Threshold Amplitude: 1.875 V
Lead Channel Pacing Threshold Pulse Width: 0.4 ms
Lead Channel Pacing Threshold Pulse Width: 0.4 ms
Lead Channel Pacing Threshold Pulse Width: 0.4 ms
Lead Channel Sensing Intrinsic Amplitude: 1.125 mV
Lead Channel Sensing Intrinsic Amplitude: 13.5 mV
Lead Channel Setting Pacing Amplitude: 1.5 V
Lead Channel Setting Pacing Amplitude: 2.5 V
Lead Channel Setting Pacing Pulse Width: 0.4 ms
MDC IDC LEAD IMPLANT DT: 20150212
MDC IDC LEAD IMPLANT DT: 20150212
MDC IDC LEAD LOCATION: 753858
MDC IDC MSMT LEADCHNL LV IMPEDANCE VALUE: 380 Ohm
MDC IDC MSMT LEADCHNL LV IMPEDANCE VALUE: 437 Ohm
MDC IDC MSMT LEADCHNL RA SENSING INTR AMPL: 1.125 mV
MDC IDC MSMT LEADCHNL RV IMPEDANCE VALUE: 418 Ohm
MDC IDC MSMT LEADCHNL RV SENSING INTR AMPL: 13.5 mV
MDC IDC SET LEADCHNL LV PACING PULSEWIDTH: 0.8 ms
MDC IDC SET LEADCHNL RV PACING AMPLITUDE: 2 V
MDC IDC SET LEADCHNL RV SENSING SENSITIVITY: 0.9 mV
MDC IDC STAT BRADY RV PERCENT PACED: 99.7 %

## 2018-01-27 ENCOUNTER — Other Ambulatory Visit: Payer: Self-pay | Admitting: Internal Medicine

## 2018-03-17 ENCOUNTER — Other Ambulatory Visit (INDEPENDENT_AMBULATORY_CARE_PROVIDER_SITE_OTHER): Payer: BLUE CROSS/BLUE SHIELD

## 2018-03-17 DIAGNOSIS — E119 Type 2 diabetes mellitus without complications: Secondary | ICD-10-CM | POA: Diagnosis not present

## 2018-03-17 LAB — COMPREHENSIVE METABOLIC PANEL
ALT: 13 U/L (ref 0–35)
AST: 16 U/L (ref 0–37)
Albumin: 4.2 g/dL (ref 3.5–5.2)
Alkaline Phosphatase: 75 U/L (ref 39–117)
BUN: 13 mg/dL (ref 6–23)
CO2: 29 meq/L (ref 19–32)
Calcium: 9.4 mg/dL (ref 8.4–10.5)
Chloride: 105 mEq/L (ref 96–112)
Creatinine, Ser: 0.76 mg/dL (ref 0.40–1.20)
GFR: 81.53 mL/min (ref >60.00)
Glucose, Bld: 135 mg/dL — ABNORMAL HIGH (ref 70–99)
POTASSIUM: 4.4 meq/L (ref 3.5–5.1)
SODIUM: 141 meq/L (ref 135–145)
Total Bilirubin: 0.4 mg/dL (ref 0.2–1.2)
Total Protein: 7.1 g/dL (ref 6.0–8.3)

## 2018-03-17 LAB — MICROALBUMIN / CREATININE URINE RATIO
Creatinine,U: 98.8 mg/dL
MICROALB/CREAT RATIO: 0.7 mg/g (ref 0.0–30.0)
Microalb, Ur: <0.7 mg/dL (ref 0.0–1.9)

## 2018-03-17 LAB — HEMOGLOBIN A1C: HEMOGLOBIN A1C: 6.6 % — AB (ref 4.6–6.5)

## 2018-03-19 ENCOUNTER — Other Ambulatory Visit: Payer: BLUE CROSS/BLUE SHIELD

## 2018-03-22 ENCOUNTER — Ambulatory Visit (INDEPENDENT_AMBULATORY_CARE_PROVIDER_SITE_OTHER): Payer: BLUE CROSS/BLUE SHIELD

## 2018-03-22 DIAGNOSIS — I428 Other cardiomyopathies: Secondary | ICD-10-CM

## 2018-03-22 NOTE — Progress Notes (Signed)
Remote pacemaker transmission.   

## 2018-03-24 ENCOUNTER — Encounter: Payer: Self-pay | Admitting: Internal Medicine

## 2018-03-24 ENCOUNTER — Ambulatory Visit (INDEPENDENT_AMBULATORY_CARE_PROVIDER_SITE_OTHER): Payer: BLUE CROSS/BLUE SHIELD | Admitting: Internal Medicine

## 2018-03-24 VITALS — BP 136/82 | HR 71 | Temp 97.8°F | Resp 15 | Ht 62.5 in | Wt 167.6 lb

## 2018-03-24 DIAGNOSIS — I10 Essential (primary) hypertension: Secondary | ICD-10-CM

## 2018-03-24 DIAGNOSIS — H5713 Ocular pain, bilateral: Secondary | ICD-10-CM

## 2018-03-24 DIAGNOSIS — E119 Type 2 diabetes mellitus without complications: Secondary | ICD-10-CM

## 2018-03-24 DIAGNOSIS — F320 Major depressive disorder, single episode, mild: Secondary | ICD-10-CM | POA: Diagnosis not present

## 2018-03-24 DIAGNOSIS — E782 Mixed hyperlipidemia: Secondary | ICD-10-CM

## 2018-03-24 DIAGNOSIS — Z23 Encounter for immunization: Secondary | ICD-10-CM

## 2018-03-24 NOTE — Patient Instructions (Signed)
   Your diabetes is under excellent control currently. You have lowered your a1c  fron over 7 to 6.6.   Please plan to return in  4  months for follow up on diabetes.   Fasting labs can  be done a day or two prior to visit so we can discuss them at your visit.  Referral To [facility] is in process    You received your tetanus-diptheria-pertussis vaccine (TDaP) which is good for ten years.

## 2018-03-24 NOTE — Progress Notes (Signed)
Subjective:  Patient ID: Julia Pierce, female    DOB: 17-Oct-1955  Age: 62 y.o. MRN: [id number]  CC: The primary encounter diagnosis was Eye pain, bilateral. Diagnoses of Type 2 diabetes mellitus without complication, without long-term current use of insulin ([place]), Need for immunization against influenza, Need for diphtheria-tetanus-pertussis (Tdap) vaccine, Depression, major, single episode, mild ([place]), Diabetes mellitus without complication ([place]), Essential hypertension, and Mixed hypertriglyceridemia were also pertinent to this visit.  HPI Hind Chesler presents for 3 month follow up on diabetes.  Patient has no complaints today.  Patient is following a low glycemic index diet and taking all prescribed medications regularly without side effects.  Fasting sugars have been under less than 140 most of the time and post prandials have been under 160 except on rare occasions. Patient  Has resumed swimming  about once per week and intentionally trying to lose weight .she has lost 9 lbs since August .    Shoulders have been hurting Patient has had an eye exam in the last 12 months and checks feet regularly for signs of infection.  Patient does not walk barefoot outside,  And denies an numbness tingling or burning in feet. Patient is up to date on all recommended vaccinations  diabetic eye exam was done in May.by optometrist in [facility]  Dr. Landry Mellow  In [place] [address] [place]   [place] [address]   Wants to see ophthalmologist due to persistent orbital pain despite being told that her eye pressures were normal.   Has elimlnated a lot of gluten from diet  With normalization of bowel function and less bloating  Still very stressed out by her son's marital problems and volatile vindictive (soon to be x ) wife       Lab Results  Component Value Date   HGBA1C 6.6 (H) 03/17/2018    started Celesta Gentile oct 24th .  Tolerating the medication   Outpatient Medications Prior to Visit  Medication Sig  Dispense Refill  . Azelaic Acid (FINACEA EX) Apply 1 application topically 2 (two) times daily.    . B Complex Vitamins (B COMPLEX PO) Take 1 tablet by mouth daily.     . calcium elemental as carbonate (BARIATRIC TUMS ULTRA) 400 MG chewable tablet Chew by mouth.    . Cholecalciferol (VITAMIN D3) 2000 UNITS TABS Take 1 tablet by mouth daily.    Marland Kitchen esomeprazole (NEXIUM) 20 MG capsule Take 10 mg by mouth daily at 12 noon.     . fluticasone (FLONASE) 50 MCG/ACT nasal spray Place 2 sprays into both nostrils daily.    . furosemide (LASIX) 20 MG tablet Take 1 tablet (20 mg total) by mouth daily. As needed for fluid retention 30 tablet 0  . losartan-hydrochlorothiazide (HYZAAR) 50-12.5 MG tablet TAKE 1 TABLET BY MOUTH EVERY DAY (Patient taking differently: Take 0.5 tablets by mouth daily. ) 90 tablet 1  . methocarbamol (ROBAXIN) 500 MG tablet LIMIT 1 TABLET BY MOUTH PER DAY OR 2-3 TIMES PER DAY AS NEEDED 90 tablet 5  . metoprolol tartrate (LOPRESSOR) 25 MG tablet Take 12.5 mg by mouth 2 (two) times daily. Reported on 05/17/2015    . Multiple Vitamin (MULTIVITAMIN) tablet Take 1 tablet by mouth daily.      . sertraline (ZOLOFT) 100 MG tablet TAKE 1 TABLET BY MOUTH EVERY DAY 90 tablet 1  . sitaGLIPtin (JANUVIA) 50 MG tablet Take 1 tablet (50 mg total) by mouth daily. 30 tablet 5  . spironolactone (ALDACTONE) 25  MG tablet Take 25 mg by mouth daily.    Marland Kitchen tolterodine (DETROL) 2 MG tablet TAKE 1 TABLET TWICE A DAY 180 tablet 4  . triamcinolone cream (KENALOG) 0.1 %     . Azelaic Acid 15 % cream APPLY TO AFFECTED AREA EVERY DAY  6  . sulfamethoxazole-trimethoprim (BACTRIM DS,SEPTRA DS) 800-160 MG tablet Take 1 tablet by mouth 2 (two) times daily. 10 tablet 0   Facility-Administered Medications Prior to Visit  Medication Dose Route Frequency Provider Last Rate Last Dose  . diclofenac sodium (VOLTAREN) 1 % transdermal gel 4 g  4 g Topical QID Mohammed Kindle, MD      . midazolam (VERSED) 5 MG/5ML injection 5 mg   5 mg Intravenous Once Mohammed Kindle, MD      . orphenadrine (NORFLEX) injection 60 mg  60 mg Intramuscular Once Mohammed Kindle, MD      . sodium chloride 0.9 % injection 20 mL  20 mL Other Once Mohammed Kindle, MD      . triamcinolone acetonide (KENALOG-40) injection 40 mg  40 mg Other Once Mohammed Kindle, MD        Review of Systems;  Patient denies headache, fevers, malaise, unintentional weight loss, skin rash, eye pain, sinus congestion and sinus pain, sore throat, dysphagia,  hemoptysis , cough, dyspnea, wheezing, chest pain, palpitations, orthopnea, edema, abdominal pain, nausea, melena, diarrhea, constipation, flank pain, dysuria, hematuria, urinary  Frequency, nocturia, numbness, tingling, seizures,  Focal weakness, Loss of consciousness,  Tremor, insomnia, depression, anxiety, and suicidal ideation.      Objective:  BP 136/82 (BP Location: Left Arm, Patient Position: Sitting, Cuff Size: Normal)   Pulse 71   Temp 97.8 F (36.6 C) (Oral)   Resp 15   Ht 5' 2.5" (1.588 m)   Wt 167 lb 9.6 oz (76 kg)   SpO2 97%   BMI 30.17 kg/m   BP Readings from Last 3 Encounters:  03/24/18 136/82  01/04/18 130/74  12/16/17 128/82    Wt Readings from Last 3 Encounters:  03/24/18 167 lb 9.6 oz (76 kg)  01/04/18 172 lb 9.6 oz (78.3 kg)  12/16/17 176 lb (79.8 kg)    General appearance: alert, cooperative and appears stated age Ears: normal TM's and external ear canals both ears Throat: lips, mucosa, and tongue normal; teeth and gums normal Neck: no adenopathy, no carotid bruit, supple, symmetrical, trachea midline and thyroid not enlarged, symmetric, no tenderness/mass/nodules Back: symmetric, no curvature. ROM normal. No CVA tenderness. Lungs: clear to auscultation bilaterally Heart: regular rate and rhythm, S1, S2 normal, no murmur, click, rub or gallop Abdomen: soft, non-tender; bowel sounds normal; no masses,  no organomegaly Pulses: 2+ and symmetric Skin: Skin color, texture, turgor  normal. No rashes or lesions Lymph nodes: Cervical, supraclavicular, and axillary nodes normal.  Lab Results  Component Value Date   HGBA1C 6.6 (H) 03/17/2018   HGBA1C 7.5 (A) 12/11/2017   HGBA1C 7.2 (H) 08/21/2017    Lab Results  Component Value Date   CREATININE 0.76 03/17/2018   CREATININE 0.68 12/11/2017   CREATININE 0.66 08/21/2017    Lab Results  Component Value Date   WBC 8.0 09/11/2016   HGB 13.6 09/11/2016   HCT 40.8 09/11/2016   PLT 257.0 09/11/2016   GLUCOSE 135 (H) 03/17/2018   CHOL 219 (H) 12/11/2017   TRIG 343.0 (H) 12/11/2017   HDL 37.90 (L) 12/11/2017   LDLDIRECT 135.0 12/11/2017   LDLCALC 88 08/17/2013   ALT 13 03/17/2018   AST  16 03/17/2018   NA 141 03/17/2018   K 4.4 03/17/2018   CL 105 03/17/2018   CREATININE 0.76 03/17/2018   BUN 13 03/17/2018   CO2 29 03/17/2018   TSH 3.80 12/12/2016   HGBA1C 6.6 (H) 03/17/2018   MICROALBUR <0.7 03/17/2018    Ct Cervical Spine Wo Contrast  Result Date: 12/31/2016 CLINICAL DATA:  Bilateral hand numbness and weakness, worse on the left. Cervical radiculopathy. EXAM: CT CERVICAL SPINE WITHOUT CONTRAST TECHNIQUE: Multidetector CT imaging of the cervical spine was performed without intravenous contrast. Multiplanar CT image reconstructions were also generated. COMPARISON:  MRI of cervical spine 10/18/2012 FINDINGS: Alignment: This AP alignment is anatomic. There straightening of the normal cervical lordosis, similar the prior study. Skull base and vertebrae: The craniocervical junction is normal. Vertebral body heights are maintained. Endplate Schmorl's nodes are stable. Soft tissues and spinal canal: The soft tissues the neck demonstrate atherosclerotic calcifications at the aortic arch. 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 No significant adenopathy is present. The visualized salivary glands are within normal limits. Disc levels:  C2-3:  Negative. C3-4:  Negative. C4-5: Asymmetric left-sided uncovertebral spurring is noted. Mild  left foraminal stenosis is stable. C5-6: A broad-based disc osteophyte complex present. Uncovertebral spurring is worse on the left. Moderate central and severe left foraminal stenosis is stable. C6-7: A broad-based disc osteophyte complex is present. There is progressive effacement of the ventral CSF. Moderate left and mild right foraminal stenosis is stable. C7-T1:  Negative. Upper chest: The lung apices are clear. IMPRESSION: 1. Mild left foraminal narrowing at C3-4 is stable. 2. Moderate central and severe left foraminal stenosis at C5-6 is similar the prior exam. 3. Moderate left and mild right foraminal stenosis at C6-7 is similar the prior study. 4. Progressive central in left central canal stenosis at C6-7. Electronically Signed   By: San Morelle M.D.   On: 12/31/2016 15:04    Assessment & Plan:   Problem List Items Addressed This Visit    Depression, major, single episode, mild ([place])    Managed with zoloft,  Concurrent anxiety also managed    Family stressors  Cited and explored.  Stress eating idenitified as coping mechanism at last visit and she has lost 9 lbs since last visit  . Continue zoloft.       Diabetes mellitus without complication ([place])    Januvia started 6 weeks ago .  A1c is now at goal.  continue  Current medications   Lab Results  Component Value Date   HGBA1C 6.6 (H) 03/17/2018   Lab Results  Component Value Date   MICROALBUR <0.7 03/17/2018         Hypertension    Well controlled on current regimen of metoprolol and losartan/hct . Renal function stable, no changes today.  Lab Results  Component Value Date   CREATININE 0.76 03/17/2018   Lab Results  Component Value Date   NA 141 03/17/2018   K 4.4 03/17/2018   CL 105 03/17/2018   CO2 29 03/17/2018         Mixed hypertriglyceridemia    Untreated due to estimated risk being intermediate  Lab Results  Component Value Date   CHOL 219 (H) 12/11/2017   HDL 37.90 (L) 12/11/2017   LDLCALC 88  08/17/2013   LDLDIRECT 135.0 12/11/2017   TRIG 343.0 (H) 12/11/2017   CHOLHDL 6 12/11/2017          Other Visit Diagnoses    Eye pain, bilateral    -  Primary  Relevant Orders   Ambulatory referral to Ophthalmology   Type 2 diabetes mellitus without complication, without long-term current use of insulin ([facility])       Relevant Orders   Lipid panel   Hemoglobin A1c   Comprehensive metabolic panel   Need for immunization against influenza       Relevant Orders   Flu Vaccine QUAD 36+ mos IM (Completed)   Need for diphtheria-tetanus-pertussis (Tdap) vaccine       Relevant Orders   Tdap vaccine greater than or equal to 7yo IM (Completed)    A total of 25 minutes of face to face time was spent with patient more than half of which was spent in counselling about the above mentioned conditions  and coordination of care   I have discontinued Denaly Gatling "Susie"'s sulfamethoxazole-trimethoprim and Azelaic Acid. I am also having her maintain her multivitamin, spironolactone, B Complex Vitamins (B COMPLEX PO), Azelaic Acid (FINACEA EX), Vitamin D3, esomeprazole, fluticasone, metoprolol tartrate, furosemide, calcium elemental as carbonate, losartan-hydrochlorothiazide, methocarbamol, tolterodine, sitaGLIPtin, triamcinolone cream, and sertraline. We will continue to administer midazolam, orphenadrine, sodium chloride, triamcinolone acetonide, and diclofenac sodium.  No orders of the defined types were placed in this encounter.   Medications Discontinued During This Encounter  Medication Reason  . sulfamethoxazole-trimethoprim (BACTRIM DS,SEPTRA DS) 800-160 MG tablet Completed Course  . Azelaic Acid 15 % cream Error    Follow-up: No follow-ups on file.   Crecencio Mc, MD

## 2018-03-27 NOTE — Assessment & Plan Note (Addendum)
Well controlled on current regimen of metoprolol and losartan/hct . Renal function stable, no changes today.  Lab Results  Component Value Date   CREATININE 0.76 03/17/2018   Lab Results  Component Value Date   NA 141 03/17/2018   K 4.4 03/17/2018   CL 105 03/17/2018   CO2 29 03/17/2018

## 2018-03-27 NOTE — Assessment & Plan Note (Addendum)
Managed with zoloft,  Concurrent anxiety also managed    Family stressors  Cited and explored.  Stress eating idenitified as coping mechanism at last visit and she has lost 9 lbs since last visit  . Continue zoloft.

## 2018-03-27 NOTE — Assessment & Plan Note (Signed)
Januvia started 6 weeks ago .  A1c is now at goal.  continue  Current medications   Lab Results  Component Value Date   HGBA1C 6.6 (H) 03/17/2018   Lab Results  Component Value Date   MICROALBUR <0.7 03/17/2018

## 2018-03-27 NOTE — Assessment & Plan Note (Signed)
Untreated due to estimated risk being intermediate  Lab Results  Component Value Date   CHOL 219 (H) 12/11/2017   HDL 37.90 (L) 12/11/2017   LDLCALC 88 08/17/2013   LDLDIRECT 135.0 12/11/2017   TRIG 343.0 (H) 12/11/2017   CHOLHDL 6 12/11/2017

## 2018-04-05 DIAGNOSIS — H2513 Age-related nuclear cataract, bilateral: Secondary | ICD-10-CM | POA: Diagnosis not present

## 2018-04-05 DIAGNOSIS — D3131 Benign neoplasm of right choroid: Secondary | ICD-10-CM | POA: Diagnosis not present

## 2018-04-06 ENCOUNTER — Ambulatory Visit (INDEPENDENT_AMBULATORY_CARE_PROVIDER_SITE_OTHER): Payer: BLUE CROSS/BLUE SHIELD | Admitting: Internal Medicine

## 2018-04-06 ENCOUNTER — Encounter: Payer: Self-pay | Admitting: Internal Medicine

## 2018-04-06 VITALS — BP 136/84 | HR 76 | Ht 62.5 in | Wt 170.2 lb

## 2018-04-06 DIAGNOSIS — Z950 Presence of cardiac pacemaker: Secondary | ICD-10-CM

## 2018-04-06 DIAGNOSIS — I428 Other cardiomyopathies: Secondary | ICD-10-CM | POA: Diagnosis not present

## 2018-04-06 DIAGNOSIS — I5022 Chronic systolic (congestive) heart failure: Secondary | ICD-10-CM | POA: Diagnosis not present

## 2018-04-06 NOTE — Patient Instructions (Signed)
Medication Instructions:  Your physician recommends that you continue on your current medications as directed. Please refer to the Current Medication list given to you today.  Labwork: None ordered.  Testing/Procedures: None ordered.  Follow-Up: Your physician wants you to follow-up in: one year with [name].   You will receive a reminder letter in the mail two months in advance. If you don't receive a letter, please call our office to schedule the follow-up appointment.  Remote monitoring is used to monitor your Pacemaker from home. This monitoring reduces the number of office visits required to check your device to one time per year. It allows [place] to keep an eye on the functioning of your device to ensure it is working properly. You are scheduled for a device check from home on 06/21/2018. You may send your transmission at any time that day. If you have a wireless device, the transmission will be sent automatically. After your physician reviews your transmission, you will receive a postcard with your next transmission date.  Any Other Special Instructions Will Be Listed Below (If Applicable).  If you need a refill on your cardiac medications before your next appointment, please call your pharmacy.

## 2018-04-06 NOTE — Addendum Note (Signed)
Addended by: Rose Phi on: 04/06/2018 05:07 PM   Modules accepted: Orders

## 2018-04-06 NOTE — Progress Notes (Signed)
HPI Julia Pierce returns today for ongoing evaluation and management of her biventricular pacemaker and chronic systolic heart failure.  In the interim she has been stable.  Her weight is unchanged.  She denies syncope, chest pain, or shortness of breath.  She admits to being a little more sedentary.  She is frustrated by her inability to lose any weight. Allergies  Allergen Reactions  . Cephalexin Anaphylaxis  . Penicillin G Hives, Shortness Of Breath and Swelling  . Penicillins Hives, Shortness Of Breath and Swelling  . Atorvastatin     Other reaction(s): Muscle Pain myalgias myalgias  . Cymbalta [Duloxetine Hcl] Other (See Comments)  . Duloxetine     Other reaction(s): Muscle Pain Other reaction(s): Muscle Pain  . Fenofibrate     Muscle pain   . Statins     Severe myalgias  . Latex Rash     Current Outpatient Medications  Medication Sig Dispense Refill  . Azelaic Acid (FINACEA EX) Apply 1 application topically 2 (two) times daily.    . B Complex Vitamins (B COMPLEX PO) Take 1 tablet by mouth daily.     . calcium elemental as carbonate (BARIATRIC TUMS ULTRA) 400 MG chewable tablet Chew by mouth.    . Cholecalciferol (VITAMIN D3) 2000 UNITS TABS Take 1 tablet by mouth daily.    Marland Kitchen esomeprazole (NEXIUM) 20 MG capsule Take 10 mg by mouth daily at 12 noon.     . fluticasone (FLONASE) 50 MCG/ACT nasal spray Place 2 sprays into both nostrils daily.    . furosemide (LASIX) 20 MG tablet Take 1 tablet (20 mg total) by mouth daily. As needed for fluid retention 30 tablet 0  . losartan-hydrochlorothiazide (HYZAAR) 50-12.5 MG tablet TAKE 1 TABLET BY MOUTH EVERY DAY (Patient taking differently: Take 0.5 tablets by mouth daily. ) 90 tablet 1  . methocarbamol (ROBAXIN) 500 MG tablet LIMIT 1 TABLET BY MOUTH PER DAY OR 2-3 TIMES PER DAY AS NEEDED 90 tablet 5  . metoprolol tartrate (LOPRESSOR) 25 MG tablet Take 12.5 mg by mouth 2 (two) times daily. Reported on 05/17/2015    . Multiple  Vitamin (MULTIVITAMIN) tablet Take 1 tablet by mouth daily.      . sertraline (ZOLOFT) 100 MG tablet TAKE 1 TABLET BY MOUTH EVERY DAY 90 tablet 1  . sitaGLIPtin (JANUVIA) 50 MG tablet Take 1 tablet (50 mg total) by mouth daily. 30 tablet 5  . spironolactone (ALDACTONE) 25 MG tablet Take 25 mg by mouth daily.    Marland Kitchen tolterodine (DETROL) 2 MG tablet TAKE 1 TABLET TWICE A DAY 180 tablet 4  . triamcinolone cream (KENALOG) 0.1 %      Current Facility-Administered Medications  Medication Dose Route Frequency Provider Last Rate Last Dose  . diclofenac sodium (VOLTAREN) 1 % transdermal gel 4 g  4 g Topical QID Mohammed Kindle, MD      . midazolam (VERSED) 5 MG/5ML injection 5 mg  5 mg Intravenous Once Mohammed Kindle, MD      . orphenadrine (NORFLEX) injection 60 mg  60 mg Intramuscular Once Mohammed Kindle, MD      . sodium chloride 0.9 % injection 20 mL  20 mL Other Once Mohammed Kindle, MD      . triamcinolone acetonide (KENALOG-40) injection 40 mg  40 mg Other Once Mohammed Kindle, MD         Past Medical History:  Diagnosis Date  . Arthritis   . Cardiac arrhythmia due to congenital heart disease   .  Chicken pox   . Depression   . Headache, frequent episodic tension-type   . High cholesterol   . History of high blood pressure    readings  . Holter monitor, abnormal August 2012   done for long QT.  some contractile asynchrony [name])  . Hx of colonoscopy sept 2012   normal,  next due 2022, [name]  . Hypertension   . Migraines   . Morton's neuroma    bilateral  . Syncope     ROS:   All systems reviewed and negative except as noted in the HPI.   Past Surgical History:  Procedure Laterality Date  . ABDOMINAL HYSTERECTOMY  Dec 2013   [place]  . APPENDECTOMY  1973  . BI-VENTRICULAR PACEMAKER INSERTION (CRT-P)  05/2013   MDT CRTP implanted by Dr Lovena Le for cardiomyopathy, LBBB, and syncope  . HYSTEROSCOPY  2005   heavy bleeding  . IMPLANTABLE CARDIOVERTER DEFIBRILLATOR IMPLANT N/A  06/02/2013   Procedure: IMPLANTABLE CARDIOVERTER DEFIBRILLATOR IMPLANT;  Surgeon: Evans Lance, MD;  Location: [facility] CATH LAB;  Service: Cardiovascular;  Laterality: N/A;  . SPINAL FUSION  03/2007   ruptured disc  L5  [name]  . TONSILLECTOMY AND ADENOIDECTOMY  1966     Family History  Problem Relation Age of Onset  . Arthritis Mother   . Hyperlipidemia Mother   . Cirrhosis Mother   . Arthritis Father   . Cancer Father        prostate cancer   . Hyperlipidemia Father   . Pulmonary embolism Father 34  . Alzheimer's disease Father   . Cancer Other        ovarian,uterus  . Heart disease Other   . Stroke Other   . Learning disabilities Other   . Coronary artery disease Paternal Grandfather 74  . Diabetes Brother   . Diabetes Maternal Uncle      Social History   Socioeconomic History  . Marital status: Married    Spouse name: Not on file  . Number of children: Not on file  . Years of education: Not on file  . Highest education level: Not on file  Occupational History  . Not on file  Social Needs  . Financial resource strain: Not hard at all  . Food insecurity:    Worry: Never true    Inability: Never true  . Transportation needs:    Medical: No    Non-medical: No  Tobacco Use  . Smoking status: Never Smoker  . Smokeless tobacco: Never Used  Substance and Sexual Activity  . Alcohol use: No  . Drug use: No  . Sexual activity: Yes  Lifestyle  . Physical activity:    Days per week: 3 days    Minutes per session: 40 min  . Stress: Only a little  Relationships  . Social connections:    Talks on phone: Not on file    Gets together: Not on file    Attends religious service: Not on file    Active member of club or organization: Not on file    Attends meetings of clubs or organizations: Not on file    Relationship status: Not on file  . Intimate partner violence:    Fear of current or ex partner: No    Emotionally abused: No    Physically abused: No    Forced  sexual activity: No  Other Topics Concern  . Not on file  Social History Narrative  . Not on file  There were no vitals taken for this visit.  Physical Exam:  Well appearing NAD HEENT: Unremarkable Neck:  No JVD, no thyromegally Lymphatics:  No adenopathy Back:  No CVA tenderness Lungs:  Clear HEART:  Regular rate rhythm, no murmurs, no rubs, no clicks Abd:  soft, positive bowel sounds, no organomegally, no rebound, no guarding Ext:  2 plus pulses, no edema, no cyanosis, no clubbing Skin:  No rashes no nodules Neuro:  CN II through XII intact, motor grossly intact  EKG - nsr with biv pacing  DEVICE  Normal device function.  See PaceArt for details.   Assess/Plan: 1. Chronic systolic heart failure - she is class 1. She will continue her current meds. 2. PPM - her  medtronic biv PPM is working normally. No change in meds. 3. Obesity - she is encouraged to lose weight.  Mikle Bosworth.D.

## 2018-04-15 ENCOUNTER — Other Ambulatory Visit: Payer: Self-pay | Admitting: Internal Medicine

## 2018-04-16 LAB — CUP PACEART REMOTE DEVICE CHECK
Battery Remaining Longevity: 43 mo
Battery Voltage: 2.97 V
Brady Statistic AP VP Percent: 0.06 %
Brady Statistic AP VS Percent: 0 %
Brady Statistic AS VS Percent: 0.23 %
Brady Statistic RA Percent Paced: 0.06 %
Brady Statistic RV Percent Paced: 99.77 %
Date Time Interrogation Session: 20191202184327
Implantable Lead Implant Date: 20150212
Implantable Lead Implant Date: 20150212
Implantable Lead Location: 753858
Implantable Lead Location: 753859
Implantable Lead Location: 753860
Implantable Lead Model: 4194
Implantable Lead Model: 5076
Implantable Lead Model: 5076
Implantable Pulse Generator Implant Date: 20150212
Lead Channel Impedance Value: 361 Ohm
Lead Channel Impedance Value: 361 Ohm
Lead Channel Impedance Value: 418 Ohm
Lead Channel Impedance Value: 475 Ohm
Lead Channel Impedance Value: 475 Ohm
Lead Channel Impedance Value: 532 Ohm
Lead Channel Pacing Threshold Amplitude: 0.625 V
Lead Channel Pacing Threshold Amplitude: 0.875 V
Lead Channel Pacing Threshold Amplitude: 1.875 V
Lead Channel Pacing Threshold Pulse Width: 0.4 ms
Lead Channel Pacing Threshold Pulse Width: 0.4 ms
Lead Channel Pacing Threshold Pulse Width: 0.4 ms
Lead Channel Sensing Intrinsic Amplitude: 1.375 mV
Lead Channel Sensing Intrinsic Amplitude: 13.5 mV
Lead Channel Setting Pacing Amplitude: 1.5 V
Lead Channel Setting Pacing Amplitude: 2 V
Lead Channel Setting Pacing Amplitude: 2.5 V
Lead Channel Setting Pacing Pulse Width: 0.4 ms
Lead Channel Setting Sensing Sensitivity: 0.9 mV
MDC IDC LEAD IMPLANT DT: 20150212
MDC IDC MSMT LEADCHNL LV IMPEDANCE VALUE: 570 Ohm
MDC IDC MSMT LEADCHNL RA IMPEDANCE VALUE: 361 Ohm
MDC IDC MSMT LEADCHNL RA SENSING INTR AMPL: 1.375 mV
MDC IDC MSMT LEADCHNL RV IMPEDANCE VALUE: 399 Ohm
MDC IDC MSMT LEADCHNL RV SENSING INTR AMPL: 13.5 mV
MDC IDC SET LEADCHNL LV PACING PULSEWIDTH: 0.8 ms
MDC IDC STAT BRADY AS VP PERCENT: 99.71 %

## 2018-04-26 ENCOUNTER — Other Ambulatory Visit: Payer: Self-pay | Admitting: Internal Medicine

## 2018-05-20 ENCOUNTER — Telehealth: Payer: Self-pay | Admitting: Cardiology

## 2018-05-20 NOTE — Telephone Encounter (Signed)
Spoke w/ pt and informed her that the request she received from her insurance company should not be due to the replacement of the home monitor. Pt verbalized understanding.

## 2018-06-16 ENCOUNTER — Other Ambulatory Visit (INDEPENDENT_AMBULATORY_CARE_PROVIDER_SITE_OTHER): Payer: BLUE CROSS/BLUE SHIELD

## 2018-06-16 DIAGNOSIS — E119 Type 2 diabetes mellitus without complications: Secondary | ICD-10-CM

## 2018-06-16 LAB — LDL CHOLESTEROL, DIRECT: LDL DIRECT: 126 mg/dL

## 2018-06-16 LAB — LIPID PANEL
Cholesterol: 208 mg/dL — ABNORMAL HIGH (ref 0–200)
HDL: 35.7 mg/dL — ABNORMAL LOW (ref >39.00)
Total CHOL/HDL Ratio: 6
Triglycerides: 401 mg/dL — ABNORMAL HIGH (ref 0.0–149.0)

## 2018-06-16 LAB — COMPREHENSIVE METABOLIC PANEL
ALT: 13 U/L (ref 0–35)
AST: 18 U/L (ref 0–37)
Albumin: 4.3 g/dL (ref 3.5–5.2)
Alkaline Phosphatase: 82 U/L (ref 39–117)
BUN: 18 mg/dL (ref 6–23)
CO2: 29 mEq/L (ref 19–32)
Calcium: 9.3 mg/dL (ref 8.4–10.5)
Chloride: 103 mEq/L (ref 96–112)
Creatinine, Ser: 0.79 mg/dL (ref 0.40–1.20)
GFR: 73.29 mL/min (ref >60.00)
Glucose, Bld: 134 mg/dL — ABNORMAL HIGH (ref 70–99)
Potassium: 4.4 mEq/L (ref 3.5–5.1)
Sodium: 140 mEq/L (ref 135–145)
TOTAL PROTEIN: 7.3 g/dL (ref 6.0–8.3)
Total Bilirubin: 0.4 mg/dL (ref 0.2–1.2)

## 2018-06-16 LAB — HEMOGLOBIN A1C: Hgb A1c MFr Bld: 6.4 % (ref 4.6–6.5)

## 2018-06-18 DIAGNOSIS — I10 Essential (primary) hypertension: Secondary | ICD-10-CM | POA: Diagnosis not present

## 2018-06-18 DIAGNOSIS — E782 Mixed hyperlipidemia: Secondary | ICD-10-CM | POA: Diagnosis not present

## 2018-06-18 DIAGNOSIS — I420 Dilated cardiomyopathy: Secondary | ICD-10-CM | POA: Diagnosis not present

## 2018-06-21 ENCOUNTER — Ambulatory Visit (INDEPENDENT_AMBULATORY_CARE_PROVIDER_SITE_OTHER): Payer: BLUE CROSS/BLUE SHIELD | Admitting: *Deleted

## 2018-06-21 DIAGNOSIS — I428 Other cardiomyopathies: Secondary | ICD-10-CM

## 2018-06-21 DIAGNOSIS — I5022 Chronic systolic (congestive) heart failure: Secondary | ICD-10-CM

## 2018-06-21 LAB — CUP PACEART REMOTE DEVICE CHECK
Battery Remaining Longevity: 41 mo
Brady Statistic AP VP Percent: 0.03 %
Brady Statistic AP VS Percent: 0 %
Brady Statistic AS VP Percent: 99.77 %
Brady Statistic AS VS Percent: 0.2 %
Brady Statistic RA Percent Paced: 0.03 %
Brady Statistic RV Percent Paced: 99.79 %
Date Time Interrogation Session: 20200302103334
Implantable Lead Implant Date: 20150212
Implantable Lead Implant Date: 20150212
Implantable Lead Implant Date: 20150212
Implantable Lead Location: 753858
Implantable Lead Location: 753859
Implantable Lead Location: 753860
Implantable Lead Model: 4194
Implantable Lead Model: 5076
Implantable Pulse Generator Implant Date: 20150212
Lead Channel Impedance Value: 361 Ohm
Lead Channel Impedance Value: 361 Ohm
Lead Channel Impedance Value: 380 Ohm
Lead Channel Impedance Value: 418 Ohm
Lead Channel Impedance Value: 437 Ohm
Lead Channel Impedance Value: 475 Ohm
Lead Channel Impedance Value: 570 Ohm
Lead Channel Pacing Threshold Amplitude: 0.625 V
Lead Channel Pacing Threshold Amplitude: 0.875 V
Lead Channel Pacing Threshold Amplitude: 1.875 V
Lead Channel Pacing Threshold Pulse Width: 0.4 ms
Lead Channel Pacing Threshold Pulse Width: 0.4 ms
Lead Channel Sensing Intrinsic Amplitude: 15.875 mV
Lead Channel Sensing Intrinsic Amplitude: 2.75 mV
Lead Channel Sensing Intrinsic Amplitude: 2.75 mV
Lead Channel Sensing Intrinsic Amplitude: 25.875 mV
Lead Channel Setting Pacing Amplitude: 1.5 V
Lead Channel Setting Pacing Amplitude: 2 V
Lead Channel Setting Pacing Amplitude: 2.5 V
Lead Channel Setting Pacing Pulse Width: 0.8 ms
Lead Channel Setting Sensing Sensitivity: 0.9 mV
MDC IDC MSMT BATTERY VOLTAGE: 2.97 V
MDC IDC MSMT LEADCHNL LV IMPEDANCE VALUE: 475 Ohm
MDC IDC MSMT LEADCHNL LV IMPEDANCE VALUE: 532 Ohm
MDC IDC MSMT LEADCHNL RV PACING THRESHOLD PULSEWIDTH: 0.4 ms
MDC IDC SET LEADCHNL RV PACING PULSEWIDTH: 0.4 ms

## 2018-06-23 ENCOUNTER — Ambulatory Visit (INDEPENDENT_AMBULATORY_CARE_PROVIDER_SITE_OTHER): Payer: BLUE CROSS/BLUE SHIELD | Admitting: Internal Medicine

## 2018-06-23 ENCOUNTER — Encounter: Payer: Self-pay | Admitting: Internal Medicine

## 2018-06-23 VITALS — BP 132/86 | HR 78 | Temp 97.7°F | Resp 14 | Ht 62.5 in | Wt 169.0 lb

## 2018-06-23 DIAGNOSIS — E119 Type 2 diabetes mellitus without complications: Secondary | ICD-10-CM | POA: Diagnosis not present

## 2018-06-23 DIAGNOSIS — E785 Hyperlipidemia, unspecified: Secondary | ICD-10-CM | POA: Diagnosis not present

## 2018-06-23 DIAGNOSIS — E1169 Type 2 diabetes mellitus with other specified complication: Secondary | ICD-10-CM | POA: Diagnosis not present

## 2018-06-23 DIAGNOSIS — E782 Mixed hyperlipidemia: Secondary | ICD-10-CM

## 2018-06-23 MED ORDER — LOSARTAN POTASSIUM 25 MG PO TABS: 25.0000 mg | ORAL_TABLET | Freq: Every day | ORAL | 1 refills | Status: DC

## 2018-06-23 MED ORDER — HYDROCHLOROTHIAZIDE 12.5 MG PO CAPS: 12.5000 mg | ORAL_CAPSULE | Freq: Every day | ORAL | 1 refills | Status: DC

## 2018-06-23 NOTE — Progress Notes (Signed)
Subjective:  Patient ID: Julia Pierce, female    DOB: [birth year]  Age: 63 y.o. MRN: [id number]  CC: The primary encounter diagnosis was Diabetes mellitus without complication ([place]). Diagnoses of Mixed hypertriglyceridemia and Type 2 diabetes mellitus with hyperlipidemia ([place]) were also pertinent to this visit.  HPI Julia Pierce presents for 3 month follow up on diabetes complicated by obesity and hyperliidemia. Fasting labs were done last week and reviewed with patient today.     Elevated triglycerides were again  noted with history of fenofibrate intolerance due to severe myalgias. Appears to be familial.  Diet has been poor , she has been eating a lot of bread,  But avoids potatoes and rice,  and she has not been exercising   Patient has no complaints today other than  Multiple family stressors:  Son getting a divorce,  dtr in [place] mother obstructive to grandchildren.  Brother in law died.  Sister in law diagnosed  With  colon cancer.  Back is hurting ,  Aggravated by driving distances greater than 10 miles, which she has to do several times per month dealing with family estate issues .  Back pain improves with Motrin which raises her blood pressure.  Upper back,  Below the bra strap   Has severe left foraminal stenosis at C5-6   Seeing  Eye Porfilio  for eye pain.  Dry eyes.  Freckle on retina,  Recheck in June   .  Patient isnot  following a low glycemic index diet and taking all prescribed medications regularly without side effects.  Fasting sugars have been under less than 140 most of the time and post prandials have been under 160 except on rare occasions. Patient is not exercising or and intentionally trying to lose weight .  Patient has had an eye exam in the last 12 months and checks feet regularly for signs of infection.  Patient does not walk barefoot outside,  And denies an numbness tingling or burning in feet. Patient is up to date on all recommended  vaccinations.    Outpatient Medications Prior to Visit  Medication Sig Dispense Refill  . Azelaic Acid (FINACEA EX) Apply 1 application topically 2 (two) times daily.    . B Complex Vitamins (B COMPLEX PO) Take 1 tablet by mouth daily.     . calcium elemental as carbonate (BARIATRIC TUMS ULTRA) 400 MG chewable tablet Chew by mouth.    . Cholecalciferol (VITAMIN D3) 2000 UNITS TABS Take 1 tablet by mouth daily.    Marland Kitchen esomeprazole (NEXIUM) 20 MG capsule Take 10 mg by mouth daily at 12 noon.     . fluticasone (FLONASE) 50 MCG/ACT nasal spray Place 2 sprays into both nostrils daily.    . furosemide (LASIX) 20 MG tablet Take 1 tablet (20 mg total) by mouth daily. As needed for fluid retention 30 tablet 0  . JANUVIA 50 MG tablet TAKE 1 TABLET BY MOUTH EVERY DAY 90 tablet 1  . methocarbamol (ROBAXIN) 500 MG tablet TAKE 1 TABLET BY MOUTH EVERY DAY OR 2 TO 3 TIMES DAILY AS NEEDED 90 tablet 5  . metoprolol tartrate (LOPRESSOR) 25 MG tablet Take 12.5 mg by mouth 2 (two) times daily. Reported on 05/17/2015    . Multiple Vitamin (MULTIVITAMIN) tablet Take 1 tablet by mouth daily.      . sertraline (ZOLOFT) 100 MG tablet TAKE 1 TABLET BY MOUTH EVERY DAY 90 tablet 1  . spironolactone (ALDACTONE) 25 MG tablet Take 25 mg by mouth daily.    Marland Kitchen  tolterodine (DETROL) 2 MG tablet TAKE 1 TABLET TWICE A DAY 180 tablet 4  . triamcinolone cream (KENALOG) 0.1 %     . losartan-hydrochlorothiazide (HYZAAR) 50-12.5 MG tablet Take 0.5 tablets by mouth daily.     Facility-Administered Medications Prior to Visit  Medication Dose Route Frequency Provider Last Rate Last Dose  . diclofenac sodium (VOLTAREN) 1 % transdermal gel 4 g  4 g Topical QID Mohammed Kindle, MD      . midazolam (VERSED) 5 MG/5ML injection 5 mg  5 mg Intravenous Once Mohammed Kindle, MD      . orphenadrine (NORFLEX) injection 60 mg  60 mg Intramuscular Once Mohammed Kindle, MD      . sodium chloride 0.9 % injection 20 mL  20 mL Other Once Mohammed Kindle, MD       . triamcinolone acetonide (KENALOG-40) injection 40 mg  40 mg Other Once Mohammed Kindle, MD        Review of Systems;  Patient denies headache, fevers, malaise, unintentional weight loss, skin rash, eye pain, sinus congestion and sinus pain, sore throat, dysphagia,  hemoptysis , cough, dyspnea, wheezing, chest pain, palpitations, orthopnea, edema, abdominal pain, nausea, melena, diarrhea, constipation, flank pain, dysuria, hematuria, urinary  Frequency, nocturia, numbness, tingling, seizures,  Focal weakness, Loss of consciousness,  Tremor, insomnia, depression, anxiety, and suicidal ideation.      Objective:  BP 132/86 (BP Location: Left Arm, Patient Position: Sitting, Cuff Size: Normal)   Pulse 78   Temp 97.7 F (36.5 C) (Oral)   Resp 14   Ht 5' 2.5" (1.588 m)   Wt 169 lb (76.7 kg)   SpO2 93%   BMI 30.42 kg/m   BP Readings from Last 3 Encounters:  06/23/18 132/86  04/06/18 136/84  03/24/18 136/82    Wt Readings from Last 3 Encounters:  06/23/18 169 lb (76.7 kg)  04/06/18 170 lb 3.2 oz (77.2 kg)  03/24/18 167 lb 9.6 oz (76 kg)    General appearance: alert, cooperative and appears stated age Ears: normal TM's and external ear canals both ears Throat: lips, mucosa, and tongue normal; teeth and gums normal Neck: no adenopathy, no carotid bruit, supple, symmetrical, trachea midline and thyroid not enlarged, symmetric, no tenderness/mass/nodules Back: symmetric, no curvature. ROM normal. No CVA tenderness. Lungs: clear to auscultation bilaterally Heart: regular rate and rhythm, S1, S2 normal, no murmur, click, rub or gallop Abdomen: soft, non-tender; bowel sounds normal; no masses,  no organomegaly Pulses: 2+ and symmetric Skin: Skin color, texture, turgor normal. No rashes or lesions Lymph nodes: Cervical, supraclavicular, and axillary nodes normal.  Lab Results  Component Value Date   HGBA1C 6.4 06/16/2018   HGBA1C 6.6 (H) 03/17/2018   HGBA1C 7.5 (A) 12/11/2017     Lab Results  Component Value Date   CREATININE 0.79 06/16/2018   CREATININE 0.76 03/17/2018   CREATININE 0.68 12/11/2017    Lab Results  Component Value Date   WBC 8.0 09/11/2016   HGB 13.6 09/11/2016   HCT 40.8 09/11/2016   PLT 257.0 09/11/2016   GLUCOSE 134 (H) 06/16/2018   CHOL 208 (H) 06/16/2018   TRIG (H) 06/16/2018    401.0 Triglyceride is over 400; calculations on Lipids are invalid.   HDL 35.70 (L) 06/16/2018   LDLDIRECT 126.0 06/16/2018   LDLCALC 88 08/17/2013   ALT 13 06/16/2018   AST 18 06/16/2018   NA 140 06/16/2018   K 4.4 06/16/2018   CL 103 06/16/2018   CREATININE 0.79 06/16/2018  BUN 18 06/16/2018   CO2 29 06/16/2018   TSH 3.80 12/12/2016   HGBA1C 6.4 06/16/2018   MICROALBUR <0.7 03/17/2018    Ct Cervical Spine Wo Contrast  Result Date: 12/31/2016 CLINICAL DATA:  Bilateral hand numbness and weakness, worse on the left. Cervical radiculopathy. EXAM: CT CERVICAL SPINE WITHOUT CONTRAST TECHNIQUE: Multidetector CT imaging of the cervical spine was performed without intravenous contrast. Multiplanar CT image reconstructions were also generated. COMPARISON:  MRI of cervical spine 10/18/2012 FINDINGS: Alignment: This AP alignment is anatomic. There straightening of the normal cervical lordosis, similar the prior study. Skull base and vertebrae: The craniocervical junction is normal. Vertebral body heights are maintained. Endplate Schmorl's nodes are stable. Soft tissues and spinal canal: The soft tissues the neck demonstrate atherosclerotic calcifications at the aortic arch. 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 No significant adenopathy is present. The visualized salivary glands are within normal limits. Disc levels:  C2-3:  Negative. C3-4:  Negative. C4-5: Asymmetric left-sided uncovertebral spurring is noted. Mild left foraminal stenosis is stable. C5-6: A broad-based disc osteophyte complex present. Uncovertebral spurring is worse on the left. Moderate central and severe  left foraminal stenosis is stable. C6-7: A broad-based disc osteophyte complex is present. There is progressive effacement of the ventral CSF. Moderate left and mild right foraminal stenosis is stable. C7-T1:  Negative. Upper chest: The lung apices are clear. IMPRESSION: 1. Mild left foraminal narrowing at C3-4 is stable. 2. Moderate central and severe left foraminal stenosis at C5-6 is similar the prior exam. 3. Moderate left and mild right foraminal stenosis at C6-7 is similar the prior study. 4. Progressive central in left central canal stenosis at C6-7. Electronically Signed   By: San Morelle M.D.   On: 12/31/2016 15:04    Assessment & Plan:   Problem List Items Addressed This Visit    Mixed hypertriglyceridemia    Untreated due to fenofibrate intolerance but now elevated to 400. She will consider statin therapy if repeat trigs are not lower in 3 months   Lab Results  Component Value Date   CHOL 208 (H) 06/16/2018   HDL 35.70 (L) 06/16/2018   LDLCALC 88 08/17/2013   LDLDIRECT 126.0 06/16/2018   TRIG (H) 06/16/2018    401.0 Triglyceride is over 400; calculations on Lipids are invalid.   CHOLHDL 6 06/16/2018         Relevant Medications   losartan (COZAAR) 25 MG tablet   hydrochlorothiazide (MICROZIDE) 12.5 MG capsule   Other Relevant Orders   Lipid panel   Diabetes mellitus without complication ([place]) - Primary    Well controlled with Januvia started after last visit .continue  Current medications   Lab Results  Component Value Date   HGBA1C 6.4 06/16/2018   Lab Results  Component Value Date   MICROALBUR <0.7 03/17/2018         Relevant Medications   losartan (COZAAR) 25 MG tablet    Other Visit Diagnoses    Type 2 diabetes mellitus with hyperlipidemia ([facility])       Relevant Medications   losartan (COZAAR) 25 MG tablet   hydrochlorothiazide (MICROZIDE) 12.5 MG capsule   Other Relevant Orders   Hemoglobin A1c   Comprehensive metabolic panel   Microalbumin /  creatinine urine ratio      I have discontinued Julia Pierce "Susie"'s losartan-hydrochlorothiazide. I am also having her start on losartan and hydrochlorothiazide. Additionally, I am having her maintain her multivitamin, spironolactone, B Complex Vitamins (B COMPLEX PO), Azelaic Acid (FINACEA EX), Vitamin D3,  esomeprazole, fluticasone, metoprolol tartrate, furosemide, calcium elemental as carbonate, tolterodine, triamcinolone cream, sertraline, methocarbamol, and Januvia. We will continue to administer midazolam, orphenadrine, sodium chloride, triamcinolone acetonide, and diclofenac sodium.  Meds ordered this encounter  Medications  . losartan (COZAAR) 25 MG tablet    Sig: Take 1 tablet (25 mg total) by mouth daily. At night    Dispense:  90 tablet    Refill:  1    KEEP ON FILE FOR NEXT REFILL IN ABOUT 90 DAYS (PATIENT USING LOSARTAN HCTZ CURRENTLY)  . hydrochlorothiazide (MICROZIDE) 12.5 MG capsule    Sig: Take 1 capsule (12.5 mg total) by mouth daily. IN THE MORNING    Dispense:  90 capsule    Refill:  1    EEP ON FILE FOR NEXT REFILL IN ABOUT 90 DAYS (PATIENT USING LOSARTAN HCTZ CURRENTLY)    Medications Discontinued During This Encounter  Medication Reason  . losartan-hydrochlorothiazide (HYZAAR) 50-12.5 MG tablet     Follow-up: Return in about 3 months (around 09/23/2018) for follow up diabetes.   Crecencio Mc, MD

## 2018-06-23 NOTE — Patient Instructions (Addendum)
Try to use lower carb breads ONLY  MISSION WHOLE WHEAT TORTILLAS  6 G NET CARBS SOLA loaf bread (frozen section at [employer])  Start exercising again , even if it is just walking!    Dyslipidemia Dyslipidemia is an imbalance of waxy, fat-like substances (lipids) in the blood. The body needs lipids in small amounts. Dyslipidemia often involves a high level of cholesterol or triglycerides, which are types of lipids. Common forms of dyslipidemia include:  High levels of LDL cholesterol. LDL is the type of cholesterol that causes fatty deposits (plaques) to build up in the blood vessels that carry blood away from your heart (arteries).  Low levels of HDL cholesterol. HDL cholesterol is the type of cholesterol that protects against heart disease. High levels of HDL remove the LDL buildup from arteries.  High levels of triglycerides. Triglycerides are a fatty substance in the blood that is linked to a buildup of plaques in the arteries. What are the causes? Primary dyslipidemia is caused by changes (mutations) in genes that are passed down through families (inherited). These mutations cause several types of dyslipidemia. Secondary dyslipidemia is caused by lifestyle choices and diseases that lead to dyslipidemia, such as:  Eating a diet that is high in animal fat.  Not getting enough exercise.  Having diabetes, kidney disease, liver disease, or thyroid disease.  Drinking large amounts of alcohol.  Using certain medicines. What increases the risk? You are more likely to develop this condition if you are an older man or if you are a woman who has gone through menopause. Other risk factors include:  Having a family history of dyslipidemia.  Taking certain medicines, including birth control pills, steroids, some diuretics, and beta-blockers.  Smoking cigarettes.  Eating a high-fat diet.  Having certain medical conditions such as diabetes, polycystic ovary syndrome (PCOS), kidney  disease, liver disease, or hypothyroidism.  Not exercising regularly.  Being overweight or obese with too much belly fat. What are the signs or symptoms? In most cases, dyslipidemia does not usually cause any symptoms. In severe cases, very high lipid levels can cause:  Fatty bumps under the skin (xanthomas).  White or gray ring around the black center (pupil) of the eye. Very high triglyceride levels can cause inflammation of the pancreas (pancreatitis). How is this diagnosed? Your health care provider may diagnose dyslipidemia based on a routine blood test (fasting blood test). Because most people do not have symptoms of the condition, this blood testing (lipid profile) is done on adults age 28 and older and is repeated every 5 years. This test checks:  Total cholesterol. This measures the total amount of cholesterol in your blood, including LDL cholesterol, HDL cholesterol, and triglycerides. A healthy number is below 200.  LDL cholesterol. The target number for LDL cholesterol is different for each person, depending on individual risk factors. Ask your health care provider what your LDL cholesterol should be.  HDL cholesterol. An HDL level of 60 or higher is best because it helps to protect against heart disease. A number below 75 for men or below 43 for women increases the risk for heart disease.  Triglycerides. A healthy triglyceride number is below 150. If your lipid profile is abnormal, your health care provider may do other blood tests. How is this treated? Treatment depends on the type of dyslipidemia that you have and your other risk factors for heart disease and stroke. Your health care provider will have a target range for your lipid levels based on this information.  For many people, this condition may be treated by lifestyle changes, such as diet and exercise. Your health care provider may recommend that you:  Get regular exercise.  Make changes to your diet.  Quit  smoking if you smoke. If diet changes and exercise do not help you reach your goals, your health care provider may also prescribe medicine to lower lipids. The most commonly prescribed type of medicine lowers your LDL cholesterol (statin drug). If you have a high triglyceride level, your provider may prescribe another type of drug (fibrate) or an omega-3 fish oil supplement, or both. Follow these instructions at home:  Eating and drinking  Follow instructions from your health care provider or dietitian about eating or drinking restrictions.  Eat a healthy diet as told by your health care provider. This can help you reach and maintain a healthy weight, lower your LDL cholesterol, and raise your HDL cholesterol. This may include: ? Limiting your calories, if you are overweight. ? Eating more fruits, vegetables, whole grains, fish, and lean meats. ? Limiting saturated fat, trans fat, and cholesterol.  If you drink alcohol: ? Limit how much you use. ? Be aware of how much alcohol is in your drink. In the [place] one drink equals one 12 oz bottle of beer (355 mL), one 5 oz glass of wine (148 mL), or one 1 oz glass of hard liquor (44 mL).  Do not drink alcohol if: ? Your health care provider tells you not to drink. ? You are pregnant, may be pregnant, or are planning to become pregnant. Activity  Get regular exercise. Start an exercise and strength training program as told by your health care provider. Ask your health care provider what activities are safe for you. Your health care provider may recommend: ? 30 minutes of aerobic activity 4-6 days a week. Brisk walking is an example of aerobic activity. ? Strength training 2 days a week. General instructions  Do not use any products that contain nicotine or tobacco, such as cigarettes, e-cigarettes, and chewing tobacco. If you need help quitting, ask your health care provider.  Take over-the-counter and prescription medicines only as told by  your health care provider. This includes supplements.  Keep all follow-up visits as told by your health care provider. Contact a health care provider if:  You are: ? Having trouble sticking to your exercise or diet plan. ? Struggling to quit smoking or control your use of alcohol. Summary  Dyslipidemia often involves a high level of cholesterol or triglycerides, which are types of lipids.  Treatment depends on the type of dyslipidemia that you have and your other risk factors for heart disease and stroke.  For many people, treatment starts with lifestyle changes, such as diet and exercise.  Your health care provider may prescribe medicine to lower lipids. This information is not intended to replace advice given to you by your health care provider. Make sure you discuss any questions you have with your health care provider. Document Released: 04/12/2013 Document Revised: 11/30/2017 Document Reviewed: 11/06/2017 Elsevier Interactive Patient Education  [employer].

## 2018-06-24 NOTE — Assessment & Plan Note (Signed)
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 She will consider statin therapy if repeat trigs are not lower in 3 months   Lab Results  Component Value Date   CHOL 208 (H) 06/16/2018   HDL 35.70 (L) 06/16/2018   LDLCALC 88 08/17/2013   LDLDIRECT 126.0 06/16/2018   TRIG (H) 06/16/2018    401.0 Triglyceride is over 400; calculations on Lipids are invalid.   CHOLHDL 6 06/16/2018

## 2018-06-24 NOTE — Assessment & Plan Note (Signed)
Well controlled with Januvia started after last visit .continue  Current medications   Lab Results  Component Value Date   HGBA1C 6.4 06/16/2018   Lab Results  Component Value Date   MICROALBUR <0.7 03/17/2018

## 2018-06-28 NOTE — Progress Notes (Signed)
Remote pacemaker transmission.   

## 2018-06-30 LAB — CUP PACEART INCLINIC DEVICE CHECK
Implantable Lead Implant Date: 20150212
Implantable Lead Implant Date: 20150212
Implantable Lead Implant Date: 20150212
Implantable Lead Location: 753858
Implantable Lead Location: 753860
Implantable Lead Model: 4194
Implantable Lead Model: 5076
Implantable Pulse Generator Implant Date: 20150212
MDC IDC LEAD LOCATION: 753859
MDC IDC SESS DTM: 20200311112022

## 2018-07-18 ENCOUNTER — Other Ambulatory Visit: Payer: Self-pay | Admitting: Internal Medicine

## 2018-09-20 ENCOUNTER — Ambulatory Visit (INDEPENDENT_AMBULATORY_CARE_PROVIDER_SITE_OTHER): Payer: BLUE CROSS/BLUE SHIELD | Admitting: *Deleted

## 2018-09-20 DIAGNOSIS — I428 Other cardiomyopathies: Secondary | ICD-10-CM | POA: Diagnosis not present

## 2018-09-21 LAB — CUP PACEART REMOTE DEVICE CHECK
Battery Remaining Longevity: 38 mo
Battery Voltage: 2.96 V
Brady Statistic AP VP Percent: 0.07 %
Brady Statistic AP VS Percent: 0.01 %
Brady Statistic AS VP Percent: 99.7 %
Brady Statistic AS VS Percent: 0.23 %
Brady Statistic RA Percent Paced: 0.08 %
Brady Statistic RV Percent Paced: 99.76 %
Date Time Interrogation Session: 20200601161406
Implantable Lead Implant Date: 20150212
Implantable Lead Implant Date: 20150212
Implantable Lead Implant Date: 20150212
Implantable Lead Location: 753858
Implantable Lead Location: 753859
Implantable Lead Location: 753860
Implantable Lead Model: 4194
Implantable Lead Model: 5076
Implantable Lead Model: 5076
Implantable Pulse Generator Implant Date: 20150212
Lead Channel Impedance Value: 380 Ohm
Lead Channel Impedance Value: 380 Ohm
Lead Channel Impedance Value: 418 Ohm
Lead Channel Impedance Value: 456 Ohm
Lead Channel Impedance Value: 456 Ohm
Lead Channel Impedance Value: 532 Ohm
Lead Channel Impedance Value: 532 Ohm
Lead Channel Impedance Value: 570 Ohm
Lead Channel Impedance Value: 646 Ohm
Lead Channel Pacing Threshold Amplitude: 0.625 V
Lead Channel Pacing Threshold Amplitude: 0.75 V
Lead Channel Pacing Threshold Amplitude: 1.875 V
Lead Channel Pacing Threshold Pulse Width: 0.4 ms
Lead Channel Pacing Threshold Pulse Width: 0.4 ms
Lead Channel Pacing Threshold Pulse Width: 0.4 ms
Lead Channel Sensing Intrinsic Amplitude: 16.125 mV
Lead Channel Sensing Intrinsic Amplitude: 16.125 mV
Lead Channel Sensing Intrinsic Amplitude: 2.125 mV
Lead Channel Sensing Intrinsic Amplitude: 2.125 mV
Lead Channel Setting Pacing Amplitude: 1.5 V
Lead Channel Setting Pacing Amplitude: 2 V
Lead Channel Setting Pacing Amplitude: 2.5 V
Lead Channel Setting Pacing Pulse Width: 0.4 ms
Lead Channel Setting Pacing Pulse Width: 0.8 ms
Lead Channel Setting Sensing Sensitivity: 0.9 mV

## 2018-09-27 ENCOUNTER — Encounter: Payer: Self-pay | Admitting: Cardiology

## 2018-09-27 NOTE — Progress Notes (Signed)
Remote pacemaker transmission.   

## 2018-10-06 DIAGNOSIS — D3131 Benign neoplasm of right choroid: Secondary | ICD-10-CM | POA: Diagnosis not present

## 2018-10-06 LAB — HM DIABETES EYE EXAM

## 2018-10-18 ENCOUNTER — Other Ambulatory Visit (INDEPENDENT_AMBULATORY_CARE_PROVIDER_SITE_OTHER): Payer: BC Managed Care – PPO

## 2018-10-18 ENCOUNTER — Other Ambulatory Visit: Payer: Self-pay

## 2018-10-18 DIAGNOSIS — E1169 Type 2 diabetes mellitus with other specified complication: Secondary | ICD-10-CM

## 2018-10-18 DIAGNOSIS — E785 Hyperlipidemia, unspecified: Secondary | ICD-10-CM

## 2018-10-18 DIAGNOSIS — E782 Mixed hyperlipidemia: Secondary | ICD-10-CM

## 2018-10-18 LAB — LIPID PANEL
Cholesterol: 213 mg/dL — ABNORMAL HIGH (ref 0–200)
HDL: 37 mg/dL — ABNORMAL LOW (ref >39.00)
Total CHOL/HDL Ratio: 6
Triglycerides: 466 mg/dL — ABNORMAL HIGH (ref 0.0–149.0)

## 2018-10-18 LAB — COMPREHENSIVE METABOLIC PANEL
ALT: 14 U/L (ref 0–35)
AST: 23 U/L (ref 0–37)
Albumin: 4.6 g/dL (ref 3.5–5.2)
Alkaline Phosphatase: 97 U/L (ref 39–117)
BUN: 16 mg/dL (ref 6–23)
CO2: 17 mEq/L — ABNORMAL LOW (ref 19–32)
Calcium: 9.5 mg/dL (ref 8.4–10.5)
Chloride: 102 mEq/L (ref 96–112)
Creatinine, Ser: 0.79 mg/dL (ref 0.40–1.20)
GFR: 73.22 mL/min (ref >60.00)
Glucose, Bld: 110 mg/dL — ABNORMAL HIGH (ref 70–99)
Potassium: 4.4 mEq/L (ref 3.5–5.1)
Sodium: 148 mEq/L — ABNORMAL HIGH (ref 135–145)
Total Bilirubin: 0.3 mg/dL (ref 0.2–1.2)
Total Protein: 7.1 g/dL (ref 6.0–8.3)

## 2018-10-18 LAB — MICROALBUMIN / CREATININE URINE RATIO
Creatinine,U: 117.8 mg/dL
Microalb Creat Ratio: 0.7 mg/g (ref 0.0–30.0)
Microalb, Ur: 0.8 mg/dL (ref 0.0–1.9)

## 2018-10-18 LAB — HEMOGLOBIN A1C: Hgb A1c MFr Bld: 6.7 % — ABNORMAL HIGH (ref 4.6–6.5)

## 2018-10-18 LAB — LDL CHOLESTEROL, DIRECT: Direct LDL: 115 mg/dL

## 2018-10-21 ENCOUNTER — Other Ambulatory Visit: Payer: Self-pay | Admitting: Internal Medicine

## 2018-10-27 ENCOUNTER — Other Ambulatory Visit: Payer: Self-pay

## 2018-10-27 ENCOUNTER — Encounter: Payer: Self-pay | Admitting: Internal Medicine

## 2018-10-27 ENCOUNTER — Ambulatory Visit (INDEPENDENT_AMBULATORY_CARE_PROVIDER_SITE_OTHER): Payer: BC Managed Care – PPO | Admitting: Internal Medicine

## 2018-10-27 DIAGNOSIS — F320 Major depressive disorder, single episode, mild: Secondary | ICD-10-CM

## 2018-10-27 DIAGNOSIS — I10 Essential (primary) hypertension: Secondary | ICD-10-CM

## 2018-10-27 DIAGNOSIS — E119 Type 2 diabetes mellitus without complications: Secondary | ICD-10-CM | POA: Diagnosis not present

## 2018-10-27 DIAGNOSIS — I429 Cardiomyopathy, unspecified: Secondary | ICD-10-CM | POA: Diagnosis not present

## 2018-10-27 DIAGNOSIS — E669 Obesity, unspecified: Secondary | ICD-10-CM | POA: Diagnosis not present

## 2018-10-27 DIAGNOSIS — I5022 Chronic systolic (congestive) heart failure: Secondary | ICD-10-CM

## 2018-10-27 DIAGNOSIS — E66811 Obesity, class 1: Secondary | ICD-10-CM

## 2018-10-27 DIAGNOSIS — E782 Mixed hyperlipidemia: Secondary | ICD-10-CM | POA: Diagnosis not present

## 2018-10-27 MED ORDER — GLUCOSE BLOOD VI STRP: ORAL_STRIP | 12 refills | Status: DC

## 2018-10-27 NOTE — Progress Notes (Signed)
Virtual Visit via [email or web address]  This visit type was conducted due to national recommendations for restrictions regarding the COVID-19 pandemic (e.g. social distancing).  This format is felt to be most appropriate for this patient at this time.  All issues noted in this document were discussed and addressed.  No physical exam was performed (except for noted visual exam findings with Video Visits).   I connected with@ on 10/27/18 at  9:30 AM EDT by a video enabled telemedicine application or telephone and verified that I am speaking with the correct person using two identifiers. Location patient: home Location provider: work or home office Persons participating in the virtual visit: patient, provider  I discussed the limitations, risks, security and privacy concerns of performing an evaluation and management service by telephone and the availability of in person appointments. I also discussed with the patient that there may be a patient responsible charge related to this service. The patient expressed understanding and agreed to proceed.  Reason for visit: follow up on type 2 dm,  Hyperlipidemia   HPI:  The patient has no signs or symptoms of COVID 19 infection (fever, cough, sore throat  or shortness of breath beyond what is typical for patient).  Patient denies contact with other persons with the above mentioned symptoms or with anyone confirmed to have COVID 19 .  Husband works outside of home at [employer] home improvement  [place] the [employer].    he does not like to wear masks, has limited  social contact . Patient maintaining some social distancing from husband.   Trying to stay active but hip starts hurting  if she walks too long. Missing the pool.  Had grandchildren recently stayed five days  for a visit from [facility]  3 month follow up on diabetes.  Patient has no complaints today.  Patient is following a low glycemic index diet and taking all prescribed medications regularly without side effects.   Fasting sugars have been under less than 140 most of the time and post prandials have been under 160 except on rare occasions. Patient has had an eye exam in the last 12 months and checks feet regularly for signs of infection.  Patient does not walk barefoot outside,  And denies an numbness tingling or burning in feet. Patient is up to date on all recommended vaccinations  Depression:  Symptoms improving as her son's family matters improve.  Still weary some days  But attributes fatigue to decreased exercise and increased financial responsibility handling  Her nephew's estate.  Demoes. chst pain,  Dyspnea and orthopnea.  No fluid retention.      ROS: See pertinent positives and negatives per HPI.  Past Medical History:  Diagnosis Date  . Arthritis   . Cardiac arrhythmia due to congenital heart disease   . Chicken pox   . Chronic systolic heart failure ([place]) 09/13/2013   EF now 55%  Bu June 2019   ECHO  . Depression   . Headache, frequent episodic tension-type   . High cholesterol   . History of high blood pressure    readings  . Holter monitor, abnormal August 2012   done for long QT.  some contractile asynchrony [name])  . Hx of colonoscopy sept 2012   normal,  next due 2022, [name]  . Hypertension   . Migraines   . Morton's neuroma    bilateral  . Syncope     Past Surgical History:  Procedure Laterality Date  . ABDOMINAL HYSTERECTOMY  Dec  2013   [place]  . APPENDECTOMY  1973  . BI-VENTRICULAR PACEMAKER INSERTION (CRT-P)  05/2013   MDT CRTP implanted by Dr Lovena Le for cardiomyopathy, LBBB, and syncope  . HYSTEROSCOPY  2005   heavy bleeding  . IMPLANTABLE CARDIOVERTER DEFIBRILLATOR IMPLANT N/A 06/02/2013   Procedure: IMPLANTABLE CARDIOVERTER DEFIBRILLATOR IMPLANT;  Surgeon: Evans Lance, MD;  Location: [facility] CATH LAB;  Service: Cardiovascular;  Laterality: N/A;  . SPINAL FUSION  03/2007   ruptured disc  L5  [name]  . TONSILLECTOMY AND ADENOIDECTOMY  1966    Family  History  Problem Relation Age of Onset  . Arthritis Mother   . Hyperlipidemia Mother   . Cirrhosis Mother   . Arthritis Father   . Cancer Father        prostate cancer   . Hyperlipidemia Father   . Pulmonary embolism Father 42  . Alzheimer's disease Father   . Cancer Other        ovarian,uterus  . Heart disease Other   . Stroke Other   . Learning disabilities Other   . Coronary artery disease Paternal Grandfather 81  . Diabetes Brother   . Diabetes Maternal Uncle     SOCIAL HX:  reports that she has never smoked. She has never used smokeless tobacco. She reports that she does not drink alcohol or use drugs.   Current Outpatient Medications:  .  Azelaic Acid 15 % cream, APPLY TO AFFECTED AREA EVERY DAY, Disp: , Rfl:  .  B Complex Vitamins (B COMPLEX PO), Take 1 tablet by mouth daily. , Disp: , Rfl:  .  calcium elemental as carbonate (BARIATRIC TUMS ULTRA) 400 MG chewable tablet, Chew by mouth., Disp: , Rfl:  .  chlorhexidine (PERIDEX) 0.12 % solution, SWISH AND SPIT 1/2OZ 2 TIMES PER DAY FOR 2 FULL MINUTES, Disp: , Rfl:  .  Cholecalciferol (VITAMIN D3) 2000 UNITS TABS, Take 1 tablet by mouth daily., Disp: , Rfl:  .  esomeprazole (NEXIUM) 20 MG capsule, Take 10 mg by mouth daily at 12 noon. , Disp: , Rfl:  .  fluticasone (FLONASE) 50 MCG/ACT nasal spray, Place 2 sprays into both nostrils daily., Disp: , Rfl:  .  furosemide (LASIX) 20 MG tablet, Take 1 tablet (20 mg total) by mouth daily. As needed for fluid retention, Disp: 30 tablet, Rfl: 0 .  hydrochlorothiazide (MICROZIDE) 12.5 MG capsule, Take 1 capsule (12.5 mg total) by mouth daily. IN THE MORNING, Disp: 90 capsule, Rfl: 1 .  JANUVIA 50 MG tablet, TAKE 1 TABLET BY MOUTH EVERY DAY, Disp: 90 tablet, Rfl: 1 .  losartan (COZAAR) 25 MG tablet, Take 1 tablet (25 mg total) by mouth daily. At night, Disp: 90 tablet, Rfl: 1 .  methocarbamol (ROBAXIN) 500 MG tablet, TAKE 1 TABLET BY MOUTH EVERY DAY OR 2 TO 3 TIMES DAILY AS NEEDED, Disp:  90 tablet, Rfl: 5 .  metoprolol tartrate (LOPRESSOR) 25 MG tablet, Take 12.5 mg by mouth 2 (two) times daily. Reported on 05/17/2015, Disp: , Rfl:  .  Multiple Vitamin (MULTIVITAMIN) tablet, Take 1 tablet by mouth daily.  , Disp: , Rfl:  .  sertraline (ZOLOFT) 100 MG tablet, TAKE 1 TABLET BY MOUTH EVERY DAY, Disp: 90 tablet, Rfl: 1 .  spironolactone (ALDACTONE) 25 MG tablet, Take 25 mg by mouth daily., Disp: , Rfl:  .  tolterodine (DETROL) 2 MG tablet, TAKE 1 TABLET TWICE A DAY, Disp: 180 tablet, Rfl: 4 .  glucose blood test strip, Strips to fit  the [employer] 2 glucometer.  Test blood sugars one time daily, Disp: 100 each, Rfl: 12  Current Facility-Administered Medications:  .  diclofenac sodium (VOLTAREN) 1 % transdermal gel 4 g, 4 g, Topical, QID, Mohammed Kindle, MD .  midazolam (VERSED) 5 MG/5ML injection 5 mg, 5 mg, Intravenous, Once, Mohammed Kindle, MD .  orphenadrine (NORFLEX) injection 60 mg, 60 mg, Intramuscular, Once, Mohammed Kindle, MD .  sodium chloride 0.9 % injection 20 mL, 20 mL, Other, Once, Mohammed Kindle, MD .  triamcinolone acetonide (KENALOG-40) injection 40 mg, 40 mg, Other, Once, Mohammed Kindle, MD  EXAMTonette Bihari per patient if applicable:  GENERAL: alert, oriented, appears well and in no acute distress  HEENT: atraumatic, conjunttiva clear, no obvious abnormalities on inspection of external nose and ears  NECK: normal movements of the head and neck  LUNGS: on inspection no signs of respiratory distress, breathing rate appears normal, no obvious gross SOB, gasping or wheezing  CV: no obvious cyanosis  MS: moves all visible extremities without noticeable abnormality  PSYCH/NEURO: pleasant and cooperative, no obvious depression or anxiety, speech and thought processing grossly intact  ASSESSMENT AND PLAN:  Obesity (BMI 30.0-34.9) I have addressed  BMI and recommended a low glycemic index diet utilizing smaller more frequent meals to increase metabolism.  I  have also recommended that patient resume exercising with a goal of 30 minutes of aerobic exercise a minimum of 5 days per week.  Hypertension Well controlled on current regimen of metoprolol and losartan/hct . Renal function stable, no changes today.  Lab Results  Component Value Date   CREATININE 0.79 10/18/2018   Lab Results  Component Value Date   NA 148 (H) 10/18/2018   K 4.4 10/18/2018   CL 102 10/18/2018   CO2 17 (L) 10/18/2018     Diabetes mellitus without complication ([place]) Well controlled with Januvia  .continue  Current medications   Lab Results  Component Value Date   HGBA1C 6.7 (H) 10/18/2018   Lab Results  Component Value Date   MICROALBUR 0.8 10/18/2018     Cardiomyopathy ([place]) Her systolic dysfunction has resolved over the last several years.    I discussed the assessment and treatment plan with the patient. The patient was provided an opportunity to ask questions and all were answered. The patient agreed with the plan and demonstrated an understanding of the instructions.   The patient was advised to call back or seek an in-person evaluation if the symptoms worsen or if the condition fails to improve as anticipated.  I provided 25 minutes of non-face-to-face time during this encounter.   Crecencio Mc, MD

## 2018-10-28 ENCOUNTER — Encounter: Payer: Self-pay | Admitting: Internal Medicine

## 2018-10-28 NOTE — Assessment & Plan Note (Signed)
Well controlled on current regimen of metoprolol and losartan/hct . Renal function stable, no changes today.  Lab Results  Component Value Date   CREATININE 0.79 10/18/2018   Lab Results  Component Value Date   NA 148 (H) 10/18/2018   K 4.4 10/18/2018   CL 102 10/18/2018   CO2 17 (L) 10/18/2018

## 2018-10-28 NOTE — Assessment & Plan Note (Signed)
Well controlled with Januvia  .continue  Current medications   Lab Results  Component Value Date   HGBA1C 6.7 (H) 10/18/2018   Lab Results  Component Value Date   MICROALBUR 0.8 10/18/2018

## 2018-10-28 NOTE — Assessment & Plan Note (Signed)
I have addressed  BMI and recommended a low glycemic index diet utilizing smaller more frequent meals to increase metabolism.  I have also recommended that patient resume exercising with a goal of 30 minutes of aerobic exercise a minimum of 5 days per week.

## 2018-10-28 NOTE — Assessment & Plan Note (Signed)
Her systolic dysfunction has resolved over the last several years.

## 2018-12-20 ENCOUNTER — Ambulatory Visit (INDEPENDENT_AMBULATORY_CARE_PROVIDER_SITE_OTHER): Payer: BC Managed Care – PPO | Admitting: *Deleted

## 2018-12-20 DIAGNOSIS — I428 Other cardiomyopathies: Secondary | ICD-10-CM

## 2018-12-20 LAB — CUP PACEART REMOTE DEVICE CHECK
Battery Remaining Longevity: 35 mo
Battery Voltage: 2.96 V
Brady Statistic AP VP Percent: 0.14 %
Brady Statistic AP VS Percent: 0.03 %
Brady Statistic AS VP Percent: 99.6 %
Brady Statistic AS VS Percent: 0.23 %
Brady Statistic RA Percent Paced: 0.17 %
Brady Statistic RV Percent Paced: 99.7 %
Date Time Interrogation Session: 20200831122106
Implantable Lead Implant Date: 20150212
Implantable Lead Implant Date: 20150212
Implantable Lead Implant Date: 20150212
Implantable Lead Location: 753858
Implantable Lead Location: 753859
Implantable Lead Location: 753860
Implantable Lead Model: 4194
Implantable Lead Model: 5076
Implantable Lead Model: 5076
Implantable Pulse Generator Implant Date: 20150212
Lead Channel Impedance Value: 361 Ohm
Lead Channel Impedance Value: 380 Ohm
Lead Channel Impedance Value: 380 Ohm
Lead Channel Impedance Value: 418 Ohm
Lead Channel Impedance Value: 456 Ohm
Lead Channel Impedance Value: 475 Ohm
Lead Channel Impedance Value: 494 Ohm
Lead Channel Impedance Value: 551 Ohm
Lead Channel Impedance Value: 589 Ohm
Lead Channel Pacing Threshold Amplitude: 0.625 V
Lead Channel Pacing Threshold Amplitude: 0.875 V
Lead Channel Pacing Threshold Amplitude: 1.875 V
Lead Channel Pacing Threshold Pulse Width: 0.4 ms
Lead Channel Pacing Threshold Pulse Width: 0.4 ms
Lead Channel Pacing Threshold Pulse Width: 0.4 ms
Lead Channel Sensing Intrinsic Amplitude: 1.5 mV
Lead Channel Sensing Intrinsic Amplitude: 1.5 mV
Lead Channel Sensing Intrinsic Amplitude: 18 mV
Lead Channel Sensing Intrinsic Amplitude: 18 mV
Lead Channel Setting Pacing Amplitude: 1.5 V
Lead Channel Setting Pacing Amplitude: 2 V
Lead Channel Setting Pacing Amplitude: 2.5 V
Lead Channel Setting Pacing Pulse Width: 0.4 ms
Lead Channel Setting Pacing Pulse Width: 0.8 ms
Lead Channel Setting Sensing Sensitivity: 0.9 mV

## 2018-12-21 DIAGNOSIS — I420 Dilated cardiomyopathy: Secondary | ICD-10-CM | POA: Diagnosis not present

## 2018-12-21 DIAGNOSIS — E782 Mixed hyperlipidemia: Secondary | ICD-10-CM | POA: Diagnosis not present

## 2018-12-21 DIAGNOSIS — Z950 Presence of cardiac pacemaker: Secondary | ICD-10-CM | POA: Diagnosis not present

## 2018-12-21 DIAGNOSIS — I10 Essential (primary) hypertension: Secondary | ICD-10-CM | POA: Diagnosis not present

## 2018-12-28 ENCOUNTER — Encounter: Payer: Self-pay | Admitting: Cardiology

## 2018-12-28 NOTE — Progress Notes (Signed)
Remote pacemaker transmission.   

## 2018-12-30 ENCOUNTER — Other Ambulatory Visit: Payer: Self-pay | Admitting: Internal Medicine

## 2019-01-05 DIAGNOSIS — Z872 Personal history of diseases of the skin and subcutaneous tissue: Secondary | ICD-10-CM | POA: Diagnosis not present

## 2019-01-05 DIAGNOSIS — L720 Epidermal cyst: Secondary | ICD-10-CM | POA: Diagnosis not present

## 2019-01-05 DIAGNOSIS — L578 Other skin changes due to chronic exposure to nonionizing radiation: Secondary | ICD-10-CM | POA: Diagnosis not present

## 2019-01-05 DIAGNOSIS — Z86018 Personal history of other benign neoplasm: Secondary | ICD-10-CM | POA: Diagnosis not present

## 2019-01-11 ENCOUNTER — Other Ambulatory Visit: Payer: Self-pay | Admitting: Internal Medicine

## 2019-02-04 ENCOUNTER — Other Ambulatory Visit: Payer: Self-pay

## 2019-02-04 ENCOUNTER — Ambulatory Visit (INDEPENDENT_AMBULATORY_CARE_PROVIDER_SITE_OTHER): Payer: Medicare Other

## 2019-02-04 DIAGNOSIS — Z23 Encounter for immunization: Secondary | ICD-10-CM

## 2019-02-12 ENCOUNTER — Other Ambulatory Visit: Payer: Self-pay | Admitting: Internal Medicine

## 2019-02-14 ENCOUNTER — Other Ambulatory Visit: Payer: Self-pay | Admitting: Internal Medicine

## 2019-03-09 ENCOUNTER — Telehealth: Payer: Self-pay

## 2019-03-09 ENCOUNTER — Ambulatory Visit (INDEPENDENT_AMBULATORY_CARE_PROVIDER_SITE_OTHER): Payer: BC Managed Care – PPO

## 2019-03-09 ENCOUNTER — Other Ambulatory Visit: Payer: Self-pay

## 2019-03-09 DIAGNOSIS — Z0000 Encounter for general adult medical examination without abnormal findings: Secondary | ICD-10-CM

## 2019-03-09 MED ORDER — BLOOD GLUCOSE METER KIT: PACK | 0 refills | Status: DC

## 2019-03-09 NOTE — Telephone Encounter (Signed)
Called patient's pharmacy to refill [employer] 2 glucometer strips. Pharmacy states this brand is no longer offered and patient needs a new glucometer/strip series. Patient aware. Please order new brand of glucometer and strips.

## 2019-03-09 NOTE — Patient Instructions (Addendum)
  Julia Pierce , Thank you for taking time to come for your Medicare Wellness Visit. I appreciate your ongoing commitment to your health goals. Please review the following plan we discussed and let me know if I can assist you in the future.   These are the goals we discussed: Goals    . Increase physical activity     Swim for exercise       This is a list of the screening recommended for you and due dates:  Health Maintenance  Topic Date Due  . Hemoglobin A1C  04/19/2019  . Complete foot exam   06/23/2019  . Mammogram  08/23/2019  . Eye exam for diabetics  10/06/2019  . Colon Cancer Screening  01/08/2021  . Tetanus Vaccine  03/24/2028  . Flu Shot  Completed  .  Hepatitis C: One time screening is recommended by Center for Disease Control  (CDC) for  adults born from [birth year] through 1966.   Completed  . HIV Screening  Completed  . Pap Smear  Discontinued

## 2019-03-09 NOTE — Progress Notes (Addendum)
Subjective:   Julia Pierce is a 63 y.o. female who presents for Medicare Annual (Subsequent) preventive examination.  Review of Systems:  No ROS.  Medicare Wellness Virtual Visit.  Visual/audio telehealth visit, UTA vital signs.   See social history for additional risk factors.   Cardiac Risk Factors include: hypertension;diabetes mellitus     Objective:     Vitals: There were no vitals taken for this visit.  There is no height or weight on file to calculate BMI.  Advanced Directives 03/09/2019 12/16/2017 12/15/2016 12/31/2015 11/27/2015 10/26/2015 09/20/2015  Does Patient Have a Medical Advance Directive? No No No No No No No  Would patient like information on creating a medical advance directive? No - Patient declined No - Patient declined No - Patient declined - - - -    Tobacco Social History   Tobacco Use  Smoking Status Never Smoker  Smokeless Tobacco Never Used     Counseling given: Not Answered   Clinical Intake:  Pre-visit preparation completed: Yes        Diabetes: Yes(Followed by pcp)  How often do you need to have someone help you when you read instructions, pamphlets, or other written materials from your doctor or pharmacy?: 1 - Never  Interpreter Needed?: No     Past Medical History:  Diagnosis Date  . Arthritis   . Cardiac arrhythmia due to congenital heart disease   . Chicken pox   . Chronic systolic heart failure ([place]) 09/13/2013   EF now 55%  Bu June 2019   ECHO  . Depression   . Headache, frequent episodic tension-type   . High cholesterol   . History of high blood pressure    readings  . Holter monitor, abnormal August 2012   done for long QT.  some contractile asynchrony [name])  . Hx of colonoscopy sept 2012   normal,  next due 2022, [name]  . Hypertension   . Migraines   . Morton's neuroma    bilateral  . Syncope    Past Surgical History:  Procedure Laterality Date  . ABDOMINAL HYSTERECTOMY  Dec 2013   [place]  .  APPENDECTOMY  1973  . BI-VENTRICULAR PACEMAKER INSERTION (CRT-P)  05/2013   MDT CRTP implanted by Dr Lovena Le for cardiomyopathy, LBBB, and syncope  . HYSTEROSCOPY  2005   heavy bleeding  . IMPLANTABLE CARDIOVERTER DEFIBRILLATOR IMPLANT N/A 06/02/2013   Procedure: IMPLANTABLE CARDIOVERTER DEFIBRILLATOR IMPLANT;  Surgeon: Evans Lance, MD;  Location: [facility] CATH LAB;  Service: Cardiovascular;  Laterality: N/A;  . SPINAL FUSION  03/2007   ruptured disc  L5  [name]  . TONSILLECTOMY AND ADENOIDECTOMY  1966   Family History  Problem Relation Age of Onset  . Arthritis Mother   . Hyperlipidemia Mother   . Cirrhosis Mother   . Arthritis Father   . Cancer Father        prostate cancer   . Hyperlipidemia Father   . Pulmonary embolism Father 44  . Alzheimer's disease Father   . Cancer Other        ovarian,uterus  . Heart disease Other   . Stroke Other   . Learning disabilities Other   . Coronary artery disease Paternal Grandfather 10  . Diabetes Brother   . Diabetes Maternal Uncle    Social History   Socioeconomic History  . Marital status: Married    Spouse name: Not on file  . Number of children: Not on file  . Years of education: Not  on file  . Highest education level: Not on file  Occupational History  . Not on file  Social Needs  . Financial resource strain: Not hard at all  . Food insecurity    Worry: Never true    Inability: Never true  . Transportation needs    Medical: No    Non-medical: No  Tobacco Use  . Smoking status: Never Smoker  . Smokeless tobacco: Never Used  Substance and Sexual Activity  . Alcohol use: No  . Drug use: No  . Sexual activity: Yes  Lifestyle  . Physical activity    Days per week: 3 days    Minutes per session: 40 min  . Stress: Only a little  Relationships  . Social Herbalist on phone: Not on file    Gets together: Not on file    Attends religious service: Not on file    Active member of club or organization: Not on file     Attends meetings of clubs or organizations: Not on file    Relationship status: Not on file  Other Topics Concern  . Not on file  Social History Narrative  . Not on file    Outpatient Encounter Medications as of 03/09/2019  Medication Sig  . Azelaic Acid 15 % cream APPLY TO AFFECTED AREA EVERY DAY  . B Complex Vitamins (B COMPLEX PO) Take 1 tablet by mouth daily.   . calcium elemental as carbonate (BARIATRIC TUMS ULTRA) 400 MG chewable tablet Chew by mouth.  . Cholecalciferol (VITAMIN D3) 2000 UNITS TABS Take 1 tablet by mouth daily.  Marland Kitchen esomeprazole (NEXIUM) 20 MG capsule Take 10 mg by mouth daily at 12 noon.   . fluticasone (FLONASE) 50 MCG/ACT nasal spray Place 2 sprays into both nostrils daily.  . furosemide (LASIX) 20 MG tablet Take 1 tablet (20 mg total) by mouth daily. As needed for fluid retention  . glucose blood test strip Strips to fit the [employer] 2 glucometer.  Test blood sugars one time daily  . hydrochlorothiazide (HYDRODIURIL) 12.5 MG tablet TAKE 1 TABLET BY MOUTH DAILY IN THE MORNING  . JANUVIA 50 MG tablet TAKE 1 TABLET BY MOUTH EVERY DAY  . losartan (COZAAR) 25 MG tablet TAKE 1 TABLET (25 MG TOTAL) BY MOUTH DAILY. AT NIGHT  . methocarbamol (ROBAXIN) 500 MG tablet TAKE 1 TABLET BY MOUTH EVERY DAY OR 2 TO 3 TIMES DAILY AS NEEDED  . metoprolol tartrate (LOPRESSOR) 25 MG tablet Take 12.5 mg by mouth 2 (two) times daily. Reported on 05/17/2015  . Multiple Vitamin (MULTIVITAMIN) tablet Take 1 tablet by mouth daily.    . sertraline (ZOLOFT) 100 MG tablet TAKE 1 TABLET BY MOUTH EVERY DAY  . spironolactone (ALDACTONE) 25 MG tablet Take 25 mg by mouth daily.  Marland Kitchen tolterodine (DETROL) 2 MG tablet TAKE 1 TABLET TWICE A DAY  . [DISCONTINUED] chlorhexidine (PERIDEX) 0.12 % solution SWISH AND SPIT 1/2OZ 2 TIMES PER DAY FOR 2 FULL MINUTES   Facility-Administered Encounter Medications as of 03/09/2019  Medication  . diclofenac sodium (VOLTAREN) 1 % transdermal gel 4 g  .  midazolam (VERSED) 5 MG/5ML injection 5 mg  . orphenadrine (NORFLEX) injection 60 mg  . sodium chloride 0.9 % injection 20 mL  . triamcinolone acetonide (KENALOG-40) injection 40 mg    Activities of Daily Living In your present state of health, do you have any difficulty performing the following activities: 03/09/2019  Hearing? N  Vision? N  Difficulty concentrating  or making decisions? N  Walking or climbing stairs? Y  Comment L leg weakness from nerve damage  Dressing or bathing? N  Doing errands, shopping? N  Preparing Food and eating ? N  Using the Toilet? N  In the past six months, have you accidently leaked urine? N  Do you have problems with loss of bowel control? N  Managing your Medications? N  Managing your Finances? N  Housekeeping or managing your Housekeeping? N  Some recent data might be hidden    Patient Care Team: Crecencio Mc, MD as PCP - General (Internal Medicine)    Assessment:   This is a routine wellness examination for [place].  Nurse connected with patient 03/09/19 at 10:30 AM EST by a telephone enabled telemedicine application and verified that I am speaking with the correct person using two identifiers. Patient stated full name and DOB. Patient gave permission to continue with virtual visit. Patient's location was at home and Nurse's location was at [place] office.   Health Maintenance Due: -Hgb A1c- 10/18/18 (6.7); ordered 10/27/18 Update all pending maintenance due as appropriate.   See completed HM at the end of note.   Eye: Visual acuity not assessed. Virtual visit. Wears corrective lenses. Followed by their ophthalmologist every 12 months.  Retinopathy- none reported.  Dental: Visits every 6 months.    Hearing: Demonstrates normal hearing during visit.  Safety:  Patient feels safe at home- yes Patient does have smoke detectors at home- yes Patient does wear sunscreen or protective clothing when in direct sunlight - yes Patient does  wear seat belt when in a moving vehicle - yes Patient drives- yes Adequate lighting in walkways free from debris- yes Grab bars and handrails used as appropriate- yes Ambulates with no assistive device Cell phone on person when ambulating outside of the home- yes  Social: Alcohol intake - no  Smoking history- never   Smokers in home? None Illicit drug use? none  Depression: PHQ 2 &9 complete. See screening below. Denies irritability, anhedonia, sadness/tearfullness.    Falls: See screening below.    Medication: Taking as directed and without issues.   Covid-19: Precautions and sickness symptoms discussed. Wears mask, social distancing, hand hygiene as appropriate.   Activities of Daily Living Patient denies needing assistance with: household chores, feeding themselves, getting from bed to chair, getting to the toilet, bathing/showering, dressing, managing money, or preparing meals.   Memory: Patient is alert. Patient denies difficulty focusing or concentrating. Correctly identified the president of the [place], season and recall. Patient likes to sew, read, and play computer games for brain stimulation.   BMI- discussed the importance of a healthy diet, water intake and the benefits of aerobic exercise.  Educational material provided.  Physical activity- stretching, swimming 2 days weekly, 30 minutes  Diet:  Moderate Water: good intake   Other Providers Patient Care Team: Crecencio Mc, MD as PCP - General (Internal Medicine)  Exercise Activities and Dietary recommendations Current Exercise Habits: Home exercise routine, Type of exercise: stretching(swimming; water aerobic), Time (Minutes): 30, Frequency (Times/Week): 2, Weekly Exercise (Minutes/Week): 60, Intensity: Mild  Goals    . Increase physical activity     Swim for exercise       Fall Risk Fall Risk  03/09/2019 12/16/2017 12/15/2016 12/31/2015 11/27/2015  Falls in the past year? 0 No No No No  Comment - - -  - -  Number falls in past yr: 0 - - - -  Injury with Fall? - - - - -  Risk for fall due to : - - - - -  Risk for fall due to: Comment - - - - -  Follow up Falls prevention discussed;Education provided - - - -   Timed Get Up and Go performed: no, virtual visit  Depression Screen PHQ 2/9 Scores 03/09/2019 10/28/2018 12/16/2017 12/15/2016  PHQ - 2 Score 0 0 0 0  PHQ- 9 Score - 2 - 0  Exception Documentation - - - -     Cognitive Function MMSE - Mini Mental State Exam 12/16/2017 12/15/2016  Orientation to time 5 5  Orientation to Place 5 5  Registration 3 3  Attention/ Calculation 5 5  Recall 3 3  Language- name 2 objects 2 2  Language- repeat 1 1  Language- follow 3 step command 3 3  Language- read & follow direction 1 1  Write a sentence 1 1  Copy design 1 1  Total score 30 30     6CIT Screen 03/09/2019  What Year? 0 points  What month? 0 points  What time? 0 points  Count back from 20 0 points  Months in reverse 0 points  Repeat phrase 0 points  Total Score 0    Immunization History  Administered Date(s) Administered  . Influenza Split 03/28/2014  . Influenza,inj,Quad PF,6+ Mos 02/07/2013, 04/11/2015, 03/06/2016, 03/18/2017, 03/24/2018, 02/04/2019  . Pneumococcal Conjugate-13 06/28/2014  . Pneumococcal Polysaccharide-23 05/11/2013  . Tdap 02/08/2008, 03/24/2018   Screening Tests Health Maintenance  Topic Date Due  . HEMOGLOBIN A1C  04/19/2019  . FOOT EXAM  06/23/2019  . MAMMOGRAM  08/23/2019  . OPHTHALMOLOGY EXAM  10/06/2019  . COLONOSCOPY  01/08/2021  . TETANUS/TDAP  03/24/2028  . INFLUENZA VACCINE  Completed  . Hepatitis C Screening  Completed  . HIV Screening  Completed  . PAP SMEAR-Modifier  Discontinued      Plan:   Keep all routine maintenance appointments.   Next scheduled lab 12/8 @ 8:45  Follow up 12/10 @ 11:00  Medicare Attestation I have personally reviewed: The patient's medical and social history Their use of alcohol, tobacco or illicit  drugs Their current medications and supplements The patient's functional ability including ADLs,fall risks, home safety risks, cognitive, and hearing and visual impairment Diet and physical activities Evidence for depression   In addition, I have reviewed and discussed with patient certain preventive protocols, quality metrics, and best practice recommendations. A written personalized care plan for preventive services as well as general preventive health recommendations were provided to patient via mail.     Varney Biles, LPN  [phone]

## 2019-03-09 NOTE — Telephone Encounter (Signed)
Sent to pharmacy 

## 2019-03-15 DIAGNOSIS — Z01419 Encounter for gynecological examination (general) (routine) without abnormal findings: Secondary | ICD-10-CM | POA: Diagnosis not present

## 2019-03-15 DIAGNOSIS — Z1231 Encounter for screening mammogram for malignant neoplasm of breast: Secondary | ICD-10-CM | POA: Diagnosis not present

## 2019-03-15 LAB — HM MAMMOGRAPHY

## 2019-03-21 ENCOUNTER — Ambulatory Visit (INDEPENDENT_AMBULATORY_CARE_PROVIDER_SITE_OTHER): Payer: BC Managed Care – PPO | Admitting: *Deleted

## 2019-03-21 DIAGNOSIS — Z950 Presence of cardiac pacemaker: Secondary | ICD-10-CM | POA: Diagnosis not present

## 2019-03-21 LAB — CUP PACEART REMOTE DEVICE CHECK
Battery Remaining Longevity: 33 mo
Battery Voltage: 2.94 V
Brady Statistic AP VP Percent: 0.01 %
Brady Statistic AP VS Percent: 0 %
Brady Statistic AS VP Percent: 99.76 %
Brady Statistic AS VS Percent: 0.23 %
Brady Statistic RA Percent Paced: 0.01 %
Brady Statistic RV Percent Paced: 99.76 %
Date Time Interrogation Session: 20201130142619
Implantable Lead Implant Date: 20150212
Implantable Lead Implant Date: 20150212
Implantable Lead Implant Date: 20150212
Implantable Lead Location: 753858
Implantable Lead Location: 753859
Implantable Lead Location: 753860
Implantable Lead Model: 4194
Implantable Lead Model: 5076
Implantable Lead Model: 5076
Implantable Pulse Generator Implant Date: 20150212
Lead Channel Impedance Value: 361 Ohm
Lead Channel Impedance Value: 361 Ohm
Lead Channel Impedance Value: 380 Ohm
Lead Channel Impedance Value: 418 Ohm
Lead Channel Impedance Value: 418 Ohm
Lead Channel Impedance Value: 456 Ohm
Lead Channel Impedance Value: 494 Ohm
Lead Channel Impedance Value: 532 Ohm
Lead Channel Impedance Value: 551 Ohm
Lead Channel Pacing Threshold Amplitude: 0.625 V
Lead Channel Pacing Threshold Amplitude: 1 V
Lead Channel Pacing Threshold Amplitude: 1.875 V
Lead Channel Pacing Threshold Pulse Width: 0.4 ms
Lead Channel Pacing Threshold Pulse Width: 0.4 ms
Lead Channel Pacing Threshold Pulse Width: 0.4 ms
Lead Channel Sensing Intrinsic Amplitude: 1.5 mV
Lead Channel Sensing Intrinsic Amplitude: 1.5 mV
Lead Channel Sensing Intrinsic Amplitude: 18.375 mV
Lead Channel Sensing Intrinsic Amplitude: 18.375 mV
Lead Channel Setting Pacing Amplitude: 1.5 V
Lead Channel Setting Pacing Amplitude: 2 V
Lead Channel Setting Pacing Amplitude: 2.5 V
Lead Channel Setting Pacing Pulse Width: 0.4 ms
Lead Channel Setting Pacing Pulse Width: 0.8 ms
Lead Channel Setting Sensing Sensitivity: 0.9 mV

## 2019-03-25 ENCOUNTER — Ambulatory Visit: Payer: BLUE CROSS/BLUE SHIELD

## 2019-03-25 ENCOUNTER — Ambulatory Visit: Payer: BLUE CROSS/BLUE SHIELD | Admitting: Internal Medicine

## 2019-03-25 ENCOUNTER — Other Ambulatory Visit: Payer: Self-pay

## 2019-03-29 ENCOUNTER — Other Ambulatory Visit: Payer: Self-pay

## 2019-03-29 ENCOUNTER — Other Ambulatory Visit (INDEPENDENT_AMBULATORY_CARE_PROVIDER_SITE_OTHER): Payer: BC Managed Care – PPO

## 2019-03-29 DIAGNOSIS — E782 Mixed hyperlipidemia: Secondary | ICD-10-CM | POA: Diagnosis not present

## 2019-03-29 DIAGNOSIS — E119 Type 2 diabetes mellitus without complications: Secondary | ICD-10-CM

## 2019-03-29 LAB — COMPREHENSIVE METABOLIC PANEL
ALT: 14 U/L (ref 0–35)
AST: 17 U/L (ref 0–37)
Albumin: 4.4 g/dL (ref 3.5–5.2)
Alkaline Phosphatase: 83 U/L (ref 39–117)
BUN: 17 mg/dL (ref 6–23)
CO2: 28 mEq/L (ref 19–32)
Calcium: 9.3 mg/dL (ref 8.4–10.5)
Chloride: 105 mEq/L (ref 96–112)
Creatinine, Ser: 0.66 mg/dL (ref 0.40–1.20)
GFR: 89.97 mL/min (ref >60.00)
Glucose, Bld: 142 mg/dL — ABNORMAL HIGH (ref 70–99)
Potassium: 4.6 mEq/L (ref 3.5–5.1)
Sodium: 141 mEq/L (ref 135–145)
Total Bilirubin: 0.5 mg/dL (ref 0.2–1.2)
Total Protein: 6.6 g/dL (ref 6.0–8.3)

## 2019-03-29 LAB — LIPID PANEL
Cholesterol: 233 mg/dL — ABNORMAL HIGH (ref 0–200)
HDL: 39.4 mg/dL (ref >39.00)
NonHDL: 193.61
Total CHOL/HDL Ratio: 6
Triglycerides: 304 mg/dL — ABNORMAL HIGH (ref 0.0–149.0)
VLDL: 60.8 mg/dL — ABNORMAL HIGH (ref 0.0–40.0)

## 2019-03-29 LAB — HEMOGLOBIN A1C: Hgb A1c MFr Bld: 6.4 % (ref 4.6–6.5)

## 2019-03-29 LAB — LDL CHOLESTEROL, DIRECT: Direct LDL: 151 mg/dL

## 2019-03-31 ENCOUNTER — Encounter: Payer: Self-pay | Admitting: Internal Medicine

## 2019-03-31 ENCOUNTER — Other Ambulatory Visit: Payer: Self-pay

## 2019-03-31 ENCOUNTER — Ambulatory Visit (INDEPENDENT_AMBULATORY_CARE_PROVIDER_SITE_OTHER): Payer: BC Managed Care – PPO | Admitting: Internal Medicine

## 2019-03-31 VITALS — Ht 62.5 in | Wt 169.0 lb

## 2019-03-31 DIAGNOSIS — I10 Essential (primary) hypertension: Secondary | ICD-10-CM

## 2019-03-31 DIAGNOSIS — E119 Type 2 diabetes mellitus without complications: Secondary | ICD-10-CM | POA: Diagnosis not present

## 2019-03-31 DIAGNOSIS — F320 Major depressive disorder, single episode, mild: Secondary | ICD-10-CM | POA: Diagnosis not present

## 2019-03-31 DIAGNOSIS — I429 Cardiomyopathy, unspecified: Secondary | ICD-10-CM | POA: Diagnosis not present

## 2019-03-31 NOTE — Assessment & Plan Note (Signed)
Unchanged.  EF 50 to 55% advised to stop excessive use of  diuretics (on3!)   Dc spironolactone and hctz
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
## 2019-04-03 NOTE — Assessment & Plan Note (Addendum)
Well controlled on current regimen of metoprolol and losartan . Renal function stable, no changes today. Stopping spironolactone and hctz  Lab Results  Component Value Date   CREATININE 0.66 03/29/2019   Lab Results  Component Value Date   NA 141 03/29/2019   K 4.6 03/29/2019   CL 105 03/29/2019   CO2 28 03/29/2019

## 2019-04-03 NOTE — Assessment & Plan Note (Signed)
Managed with zoloft,  Concurrent anxiety also managed    Family stressors  Cited and explored.  Stress eating idenitified as coping mechanism at last visit and she has lost 9 lbs since last visit  . Continue zoloft.

## 2019-04-03 NOTE — Assessment & Plan Note (Signed)
Improved control with altered eating habits.  No changes today  Lab Results  Component Value Date   HGBA1C 6.4 03/29/2019   Lab Results  Component Value Date   MICROALBUR 0.8 10/18/2018   MICROALBUR <0.7 03/17/2018

## 2019-04-07 ENCOUNTER — Other Ambulatory Visit: Payer: Self-pay | Admitting: Internal Medicine

## 2019-04-13 NOTE — Progress Notes (Signed)
PPM remote 

## 2019-04-17 NOTE — Progress Notes (Signed)
Cardiology Office Note Date:  04/20/2019  Patient ID:  Julia Pierce, Julia Pierce 01-30-1956, MRN [id number] PCP:  Crecencio Mc, MD  Cardiologist:  Dr. [name] Electrophysiologist [name]     Chief Complaint:  annual visit  History of Present Illness: Julia Pierce is a 63 y.o. female with history of DM, HTN, HLD, obesity, DCM, LBBB >> unexplained syncope >> CRT-P.  She comes in today to be seen for [name].  Last seen by him Dec 2019, she was doing well, class I symptoms, no changes were made to her meds or programming.  Mentioning a more sedentary life style.  She is doing very well.  Unfortunately with COVID unable to get to the gym, her exercise of choice is swimming and was going 3d/week, this much easier on her back.  She has managed to keep physically active, walking treadmill, though not as vigorously or long as she would be able to swim with her back pain.  Given this she feesl like she has excellent exertional capacity, no difficulties with her ADLs. She denies any SOB or DOE, denies symptoms of PND or orthopnea.  She reports her home weight this AM 169lbs and very stable for the year, no more then up/down a pound here/there.  No CP, no palpitations, no cardiac awareness.  No dizziness, near syncope or syncope.  She had labs with her PMD lipids could be better.  She reports they are improved for her and unable to take statin medicines.    Device information MDT CRT-P, implanted 06/02/2013   Past Medical History:  Diagnosis Date  . Arthritis   . Cardiac arrhythmia due to congenital heart disease   . Chicken pox   . Chronic systolic heart failure ([place]) 09/13/2013   EF now 55%  Bu June 2019   ECHO  . Depression   . Headache, frequent episodic tension-type   . High cholesterol   . History of high blood pressure    readings  . Holter monitor, abnormal August 2012   done for long QT.  some contractile asynchrony [name])  . Hx of colonoscopy sept 2012   normal,  next  due 2022, [name]  . Hypertension   . Migraines   . Morton's neuroma    bilateral  . Syncope     Past Surgical History:  Procedure Laterality Date  . ABDOMINAL HYSTERECTOMY  Dec 2013   [place]  . APPENDECTOMY  1973  . BI-VENTRICULAR PACEMAKER INSERTION (CRT-P)  05/2013   MDT CRTP implanted by Dr Lovena Le for cardiomyopathy, LBBB, and syncope  . HYSTEROSCOPY  2005   heavy bleeding  . IMPLANTABLE CARDIOVERTER DEFIBRILLATOR IMPLANT N/A 06/02/2013   Procedure: IMPLANTABLE CARDIOVERTER DEFIBRILLATOR IMPLANT;  Surgeon: Evans Lance, MD;  Location: [facility] CATH LAB;  Service: Cardiovascular;  Laterality: N/A;  . SPINAL FUSION  03/2007   ruptured disc  L5  [name]  . TONSILLECTOMY AND ADENOIDECTOMY  1966    Current Outpatient Medications  Medication Sig Dispense Refill  . Azelaic Acid 15 % cream APPLY TO AFFECTED AREA EVERY DAY    . B Complex Vitamins (B COMPLEX PO) Take 1 tablet by mouth daily.     . blood glucose meter kit and supplies Dispense based on patient and insurance preference. Use to check blood sugar once daily. (FOR ICD-10 E11.9). 1 each 0  . calcium elemental as carbonate (BARIATRIC TUMS ULTRA) 400 MG chewable tablet Chew by mouth.    . Cholecalciferol (VITAMIN D3) 2000 UNITS TABS  Take 1 tablet by mouth daily.    Marland Kitchen esomeprazole (NEXIUM) 20 MG capsule Take 10 mg by mouth daily at 12 noon.     . fluticasone (FLONASE) 50 MCG/ACT nasal spray Place 2 sprays into both nostrils daily.    Marland Kitchen JANUVIA 50 MG tablet TAKE 1 TABLET BY MOUTH EVERY DAY 90 tablet 1  . losartan (COZAAR) 25 MG tablet TAKE 1 TABLET (25 MG TOTAL) BY MOUTH DAILY. AT NIGHT 90 tablet 1  . methocarbamol (ROBAXIN) 500 MG tablet TAKE 1 TABLET BY MOUTH EVERY DAY OR 2 TO 3 TIMES DAILY AS NEEDED 90 tablet 5  . metoprolol tartrate (LOPRESSOR) 25 MG tablet Take 12.5 mg by mouth 2 (two) times daily. Reported on 05/17/2015    . Multiple Vitamin (MULTIVITAMIN) tablet Take 1 tablet by mouth daily.      . sertraline (ZOLOFT) 100 MG  tablet TAKE 1 TABLET BY MOUTH EVERY DAY 90 tablet 1  . tolterodine (DETROL) 2 MG tablet TAKE 1 TABLET TWICE A DAY 180 tablet 3   Current Facility-Administered Medications  Medication Dose Route Frequency Provider Last Rate Last Admin  . diclofenac sodium (VOLTAREN) 1 % transdermal gel 4 g  4 g Topical QID Mohammed Kindle, MD      . midazolam (VERSED) 5 MG/5ML injection 5 mg  5 mg Intravenous Once Mohammed Kindle, MD      . orphenadrine (NORFLEX) injection 60 mg  60 mg Intramuscular Once Mohammed Kindle, MD      . sodium chloride 0.9 % injection 20 mL  20 mL Other Once Mohammed Kindle, MD      . triamcinolone acetonide (KENALOG-40) injection 40 mg  40 mg Other Once Mohammed Kindle, MD        Allergies:   Cephalexin, Penicillin g, Penicillins, Atorvastatin, Cymbalta [duloxetine hcl], Duloxetine, Fenofibrate, Statins, and Latex   Social History:  The patient  reports that she has never smoked. She has never used smokeless tobacco. She reports that she does not drink alcohol or use drugs.   Family History:  The patient's family history includes Alzheimer's disease in her father; Arthritis in her father and mother; Cancer in her father and another family member; Cirrhosis in her mother; Coronary artery disease (age of onset: 66) in her paternal grandfather; Diabetes in her brother and maternal uncle; Heart disease in an other family member; Hyperlipidemia in her father and mother; Learning disabilities in an other family member; Pulmonary embolism (age of onset: 73) in her father; Stroke in an other family member.  ROS:  Please see the history of present illness.  All other systems are reviewed and otherwise negative.   PHYSICAL EXAM:  VS:  BP 138/74   Pulse 71   Ht 5' 2.5" (1.588 m)   Wt 173 lb (78.5 kg)   BMI 31.14 kg/m  BMI: Body mass index is 31.14 kg/m. Well nourished, well developed, in no acute distress  HEENT: normocephalic, atraumatic  Neck: no JVD, carotid bruits or masses Cardiac:   RRR; no significant murmurs, no rubs, or gallops Lungs:  CTA b/l, no wheezing, rhonchi or rales  Abd: soft, nontender MS: no deformity or atrophy Ext: no edema  Skin: warm and dry, no rash Neuro:  No gross deficits appreciated Psych: euthymic mood, full affect  PPM site is stable, no tethering or discomfort   EKG:  Done today and reviewed by myself shows  SR/V paed  PPM interrogation done today and reviewed by myself:  Battery and lead measurements are  good She 99.7%VP. She has had some V sensing episodes, these are 1:1 and last month particularly 03/12/2019, longest 11mn43sec 3 NSVT, one second durations OptiVol is very high  10/15/2017: TTE ([facility]) INTERPRETATION NORMAL LEFT VENTRICULAR SYSTOLIC FUNCTION WITH MILD LVH NORMAL RIGHT VENTRICULAR SYSTOLIC FUNCTION MILD VALVULAR REGURGITATION (See above) NO VALVULAR STENOSIS MILD MR, TR, PR EF 50-55%  06/02/2013: TTE Study Conclusions  Left ventricle: Limited echo with contrast. Difficult to  assess LVEF still due to windows and conduction delay   OVerall LVEF is approximately 45% with hypokinesis of hte  inferior wall; septal motion consistent with conduction  Delay    Recent Labs: 03/29/2019: ALT 14; BUN 17; Creatinine, Ser 0.66; Potassium 4.6; Sodium 141  03/29/2019: Cholesterol 233; Direct LDL 151.0; HDL 39.40; Total CHOL/HDL Ratio 6; Triglycerides 304.0; VLDL 60.8   CrCl cannot be calculated (Patient's most recent lab result is older than the maximum 21 days allowed.).   Wt Readings from Last 3 Encounters:  04/20/19 173 lb (78.5 kg)  03/31/19 169 lb (76.7 kg)  06/23/18 169 lb (76.7 kg)     Other studies reviewed: Additional studies/records reviewed today include: summarized above  ASSESSMENT AND PLAN:  1. CRT-P     Intact function, no programming changes made  2. DCM     Has had recovery of her LVEF by her echo last year     >99% BV pacing     OptiVol suggest volume OL     She reports her  weight very stable, no symptoms or exam findings to suggest volume OL     Lasix no longer is on her med list, she states her PMD discontiinued it, but she was not taking it any way      She is on ARB, BB therapy  We discussed her OPtiVol though no symptoms or findings to suggest fluid retention.  She is asked to be aware and diligent on home weights, any signs of fluid OL and reach out to her primary cardiologist if any.  3. HTN     Looks ok, no changes  4. HLD     Uncontrolled.     She will follow with her PMD and primary cardiologist.     I mention our lipid clinic her prmary doctors feel she mat benefit from that, she will follow with dr. KNehemiah Massed   Disposition: F/u with Q 3 mo remotes, in clinic in 1 year, sooner if needed  Current medicines are reviewed at length with the patient today.  The patient did not have any concerns regarding medicines.  SVenetia Night PA-C 04/20/2019 10:18 AM     [facility] [address] [phone](office)  [phone](fax)

## 2019-04-20 ENCOUNTER — Other Ambulatory Visit: Payer: Self-pay

## 2019-04-20 ENCOUNTER — Ambulatory Visit (INDEPENDENT_AMBULATORY_CARE_PROVIDER_SITE_OTHER): Payer: BC Managed Care – PPO | Admitting: Physician Assistant

## 2019-04-20 VITALS — BP 138/74 | HR 71 | Ht 62.5 in | Wt 173.0 lb

## 2019-04-20 DIAGNOSIS — I428 Other cardiomyopathies: Secondary | ICD-10-CM

## 2019-04-20 DIAGNOSIS — E785 Hyperlipidemia, unspecified: Secondary | ICD-10-CM

## 2019-04-20 DIAGNOSIS — I10 Essential (primary) hypertension: Secondary | ICD-10-CM

## 2019-04-20 DIAGNOSIS — Z950 Presence of cardiac pacemaker: Secondary | ICD-10-CM

## 2019-04-20 NOTE — Patient Instructions (Signed)
Medication Instructions:   Your physician recommends that you continue on your current medications as directed. Please refer to the Current Medication list given to you today.  *If you need a refill on your cardiac medications before your next appointment, please call your pharmacy*  Lab Work:  [place]   If you have labs (blood work) drawn today and your tests are completely normal, you will receive your results only by: Marland Kitchen MyChart Message (if you have MyChart) OR . A paper copy in the mail If you have any lab test that is abnormal or we need to change your treatment, we will call you to review the results.  Testing/Procedures:  NONE ORDERED  TODAY   Follow-Up: At [facility], you and your health needs are our priority.  As part of our continuing mission to provide you with exceptional heart care, we have created designated Provider Care Teams.  These Care Teams include your primary Cardiologist (physician) and Advanced Practice Providers (APPs -  Physician Assistants and Nurse Practitioners) who all work together to provide you with the care you need, when you need it.  Your next appointment:   1 year(s)  The format for your next appointment:   In Person  Provider:   You may see [name]  or one of the following Advanced Practice Providers on your designated Care Team:    Chanetta Marshall, NP  Tommye Standard, PA-C  Legrand Como "Oda Kilts, [place]   Other Instructions

## 2019-04-22 ENCOUNTER — Other Ambulatory Visit: Payer: Self-pay | Admitting: Internal Medicine

## 2019-06-20 ENCOUNTER — Ambulatory Visit (INDEPENDENT_AMBULATORY_CARE_PROVIDER_SITE_OTHER): Payer: BC Managed Care – PPO | Admitting: *Deleted

## 2019-06-20 DIAGNOSIS — Z950 Presence of cardiac pacemaker: Secondary | ICD-10-CM | POA: Diagnosis not present

## 2019-06-20 LAB — CUP PACEART REMOTE DEVICE CHECK
Battery Remaining Longevity: 31 mo
Battery Voltage: 2.94 V
Brady Statistic AP VP Percent: 0.01 %
Brady Statistic AP VS Percent: 0 %
Brady Statistic AS VP Percent: 99.8 %
Brady Statistic AS VS Percent: 0.19 %
Brady Statistic RA Percent Paced: 0.01 %
Brady Statistic RV Percent Paced: 99.81 %
Date Time Interrogation Session: 20210301181326
Implantable Lead Implant Date: 20150212
Implantable Lead Implant Date: 20150212
Implantable Lead Implant Date: 20150212
Implantable Lead Location: 753858
Implantable Lead Location: 753859
Implantable Lead Location: 753860
Implantable Lead Model: 4194
Implantable Lead Model: 5076
Implantable Lead Model: 5076
Implantable Pulse Generator Implant Date: 20150212
Lead Channel Impedance Value: 361 Ohm
Lead Channel Impedance Value: 380 Ohm
Lead Channel Impedance Value: 399 Ohm
Lead Channel Impedance Value: 418 Ohm
Lead Channel Impedance Value: 456 Ohm
Lead Channel Impedance Value: 475 Ohm
Lead Channel Impedance Value: 494 Ohm
Lead Channel Impedance Value: 551 Ohm
Lead Channel Impedance Value: 570 Ohm
Lead Channel Pacing Threshold Amplitude: 0.5 V
Lead Channel Pacing Threshold Amplitude: 0.75 V
Lead Channel Pacing Threshold Amplitude: 1.875 V
Lead Channel Pacing Threshold Pulse Width: 0.4 ms
Lead Channel Pacing Threshold Pulse Width: 0.4 ms
Lead Channel Pacing Threshold Pulse Width: 0.4 ms
Lead Channel Sensing Intrinsic Amplitude: 14.75 mV
Lead Channel Sensing Intrinsic Amplitude: 18.375 mV
Lead Channel Sensing Intrinsic Amplitude: 2.5 mV
Lead Channel Sensing Intrinsic Amplitude: 2.5 mV
Lead Channel Setting Pacing Amplitude: 1.5 V
Lead Channel Setting Pacing Amplitude: 2 V
Lead Channel Setting Pacing Amplitude: 2.5 V
Lead Channel Setting Pacing Pulse Width: 0.4 ms
Lead Channel Setting Pacing Pulse Width: 0.8 ms
Lead Channel Setting Sensing Sensitivity: 0.9 mV

## 2019-06-20 NOTE — Progress Notes (Signed)
PPM Remote  

## 2019-06-21 ENCOUNTER — Other Ambulatory Visit: Payer: Self-pay | Admitting: Internal Medicine

## 2019-06-26 IMAGING — CT CT CERVICAL SPINE W/O CM
2 series · 10 of 14 positions shown, 12 images · non-contrast
Comparison: MRI of cervical spine 10/18/2012

CLINICAL DATA: Bilateral hand numbness and weakness, worse on the
left. Cervical radiculopathy.

EXAM:
CT CERVICAL SPINE WITHOUT CONTRAST
TECHNIQUE: Multidetector CT imaging of the cervical spine was performed without
intravenous contrast. Multiplanar CT image reconstructions were also
generated.

[Series 2: cspine soft · axial · 0.23mm/px · z∈[-189,-61]mm · 5 of 97 slices shown]
[im 17/97  soft-tissue]
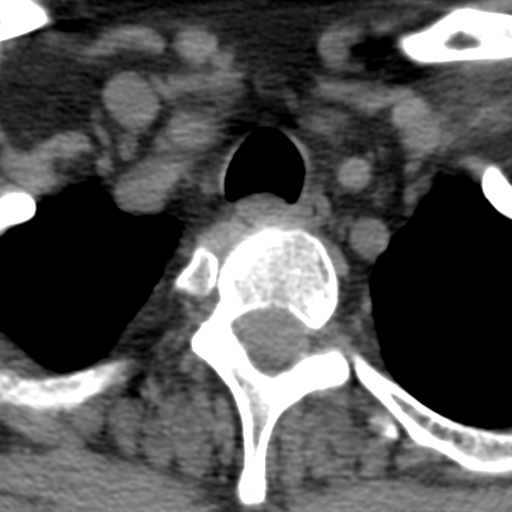
[im 33/97  soft-tissue]
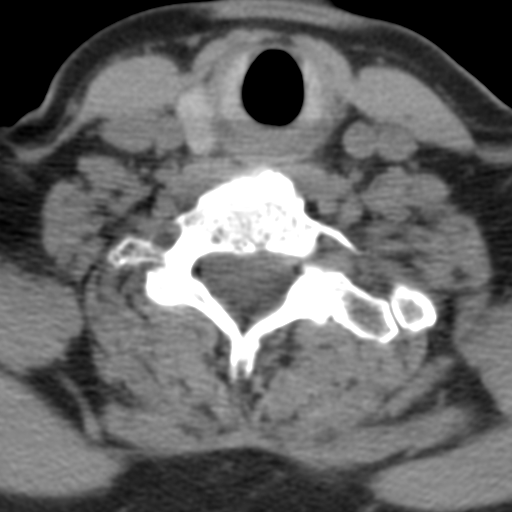
[im 49/97  soft-tissue]
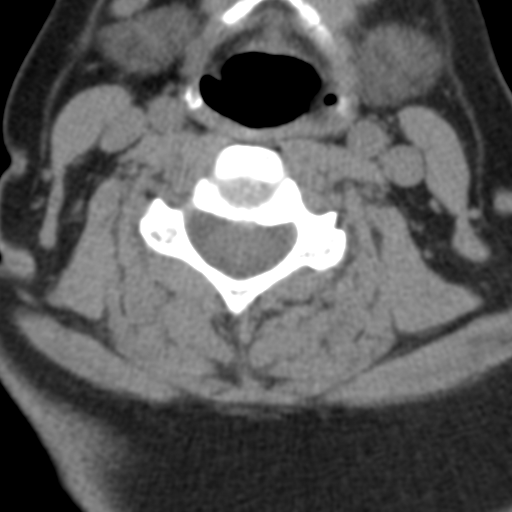
[im 65/97  soft-tissue]
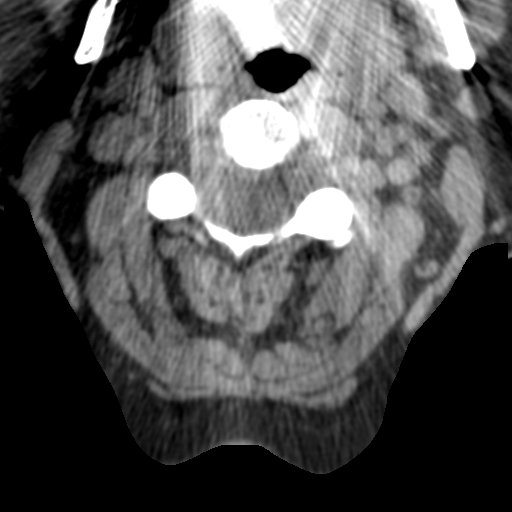
[im 81/97  soft-tissue]
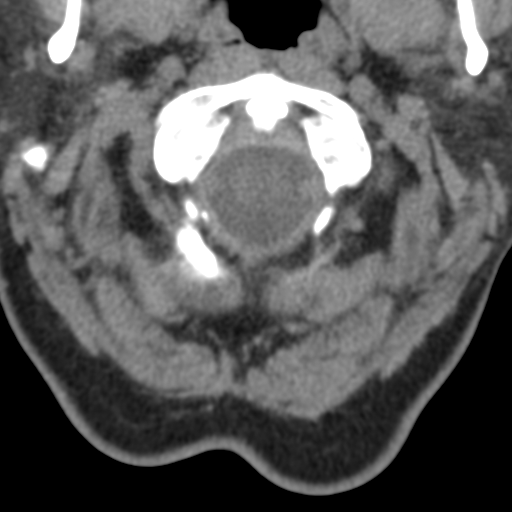

[Series 8: angled axial · axial · 0.23mm/px · z∈[-208,-84]mm · 5 of 95 slices shown, 7 images]
[im 16/95  soft-tissue]
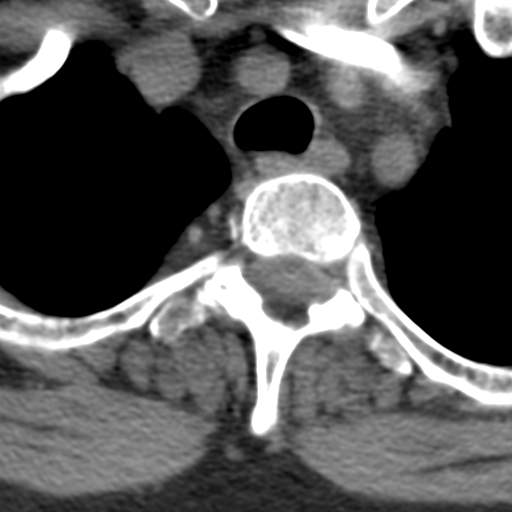
[im 16/95  bone]
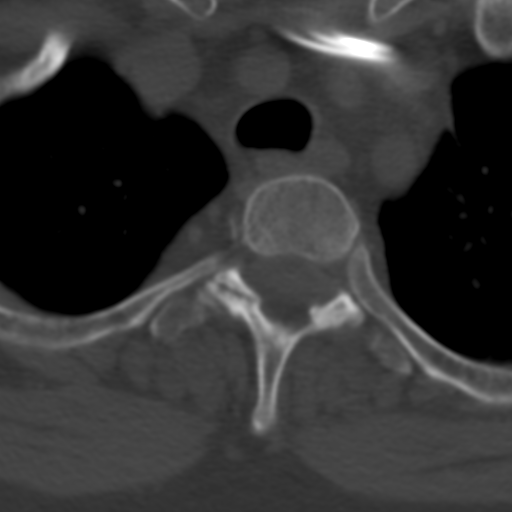
[im 32/95  bone]
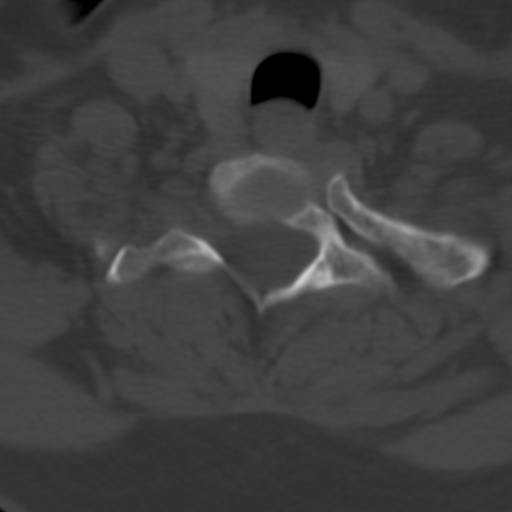
[im 48/95  bone]
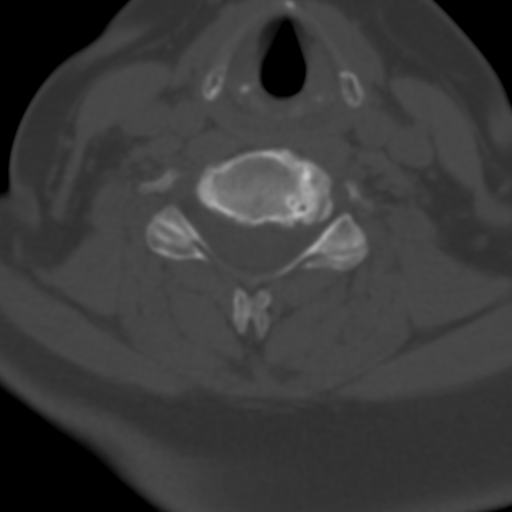
[im 63/95  bone]
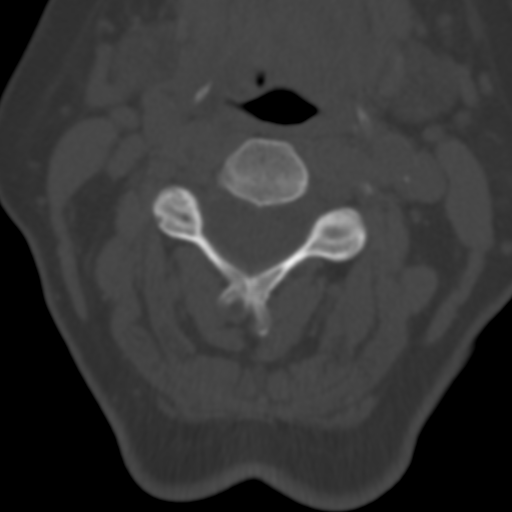
[im 79/95  soft-tissue]
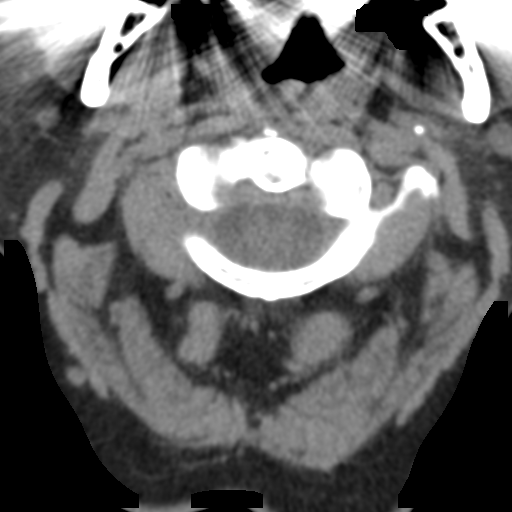
[im 79/95  bone]
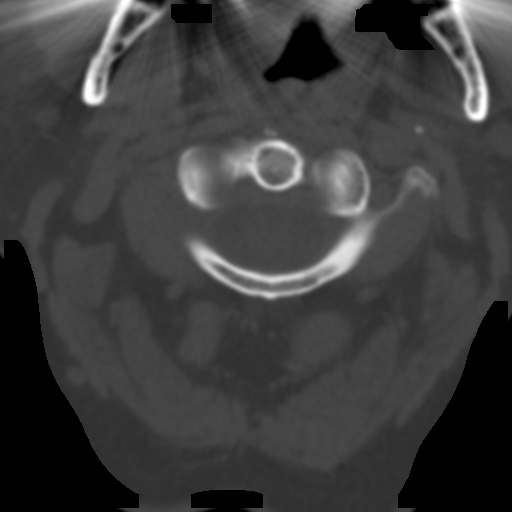

[10 of 14 positions shown; findings below may reference images not displayed]

FINDINGS: Alignment: This AP alignment is anatomic. There straightening of the
normal cervical lordosis, similar the prior study.

Skull base and vertebrae: The craniocervical junction is normal.
Vertebral body heights are maintained. Endplate Schmorl's nodes are
stable.

Soft tissues and spinal canal: The soft tissues the neck demonstrate
atherosclerotic calcifications at the aortic arch. Pacemaker leads
are noted. No significant adenopathy is present. The visualized
salivary glands are within normal limits.

Disc levels:  C2-3:  Negative.

C3-4:  Negative.

C4-5: Asymmetric left-sided uncovertebral spurring is noted. Mild
left foraminal stenosis is stable.

C5-6: A broad-based disc osteophyte complex present. Uncovertebral
spurring is worse on the left. Moderate central and severe left
foraminal stenosis is stable.

C6-7: A broad-based disc osteophyte complex is present. There is
progressive effacement of the ventral CSF. Moderate left and mild
right foraminal stenosis is stable.

C7-T1:  Negative.

Upper chest: The lung apices are clear.
IMPRESSION: 1. Mild left foraminal narrowing at C3-4 is stable.
2. Moderate central and severe left foraminal stenosis at C5-6 is
similar the prior exam.
3. Moderate left and mild right foraminal stenosis at C6-7 is
similar the prior study.
4. Progressive central in left central canal stenosis at C6-7.

## 2019-06-29 ENCOUNTER — Other Ambulatory Visit (INDEPENDENT_AMBULATORY_CARE_PROVIDER_SITE_OTHER): Payer: BC Managed Care – PPO

## 2019-06-29 ENCOUNTER — Other Ambulatory Visit: Payer: Self-pay

## 2019-06-29 DIAGNOSIS — E119 Type 2 diabetes mellitus without complications: Secondary | ICD-10-CM | POA: Diagnosis not present

## 2019-06-29 LAB — COMPREHENSIVE METABOLIC PANEL
ALT: 12 U/L (ref 0–35)
AST: 17 U/L (ref 0–37)
Albumin: 4 g/dL (ref 3.5–5.2)
Alkaline Phosphatase: 87 U/L (ref 39–117)
BUN: 16 mg/dL (ref 6–23)
CO2: 32 mEq/L (ref 19–32)
Calcium: 9 mg/dL (ref 8.4–10.5)
Chloride: 104 mEq/L (ref 96–112)
Creatinine, Ser: 0.65 mg/dL (ref 0.40–1.20)
GFR: 91.5 mL/min (ref >60.00)
Glucose, Bld: 146 mg/dL — ABNORMAL HIGH (ref 70–99)
Potassium: 4.2 mEq/L (ref 3.5–5.1)
Sodium: 141 mEq/L (ref 135–145)
Total Bilirubin: 0.4 mg/dL (ref 0.2–1.2)
Total Protein: 6.5 g/dL (ref 6.0–8.3)

## 2019-06-29 LAB — MICROALBUMIN / CREATININE URINE RATIO
Creatinine,U: 80.8 mg/dL
Microalb Creat Ratio: 1.8 mg/g (ref 0.0–30.0)
Microalb, Ur: 1.5 mg/dL (ref 0.0–1.9)

## 2019-06-29 LAB — LIPID PANEL
Cholesterol: 214 mg/dL — ABNORMAL HIGH (ref 0–200)
HDL: 41.1 mg/dL (ref >39.00)
NonHDL: 172.82
Total CHOL/HDL Ratio: 5
Triglycerides: 301 mg/dL — ABNORMAL HIGH (ref 0.0–149.0)
VLDL: 60.2 mg/dL — ABNORMAL HIGH (ref 0.0–40.0)

## 2019-06-29 LAB — LDL CHOLESTEROL, DIRECT: Direct LDL: 126 mg/dL

## 2019-06-29 LAB — HEMOGLOBIN A1C: Hgb A1c MFr Bld: 6.4 % (ref 4.6–6.5)

## 2019-07-01 ENCOUNTER — Encounter: Payer: Self-pay | Admitting: Internal Medicine

## 2019-07-01 ENCOUNTER — Ambulatory Visit (INDEPENDENT_AMBULATORY_CARE_PROVIDER_SITE_OTHER): Payer: BC Managed Care – PPO | Admitting: Internal Medicine

## 2019-07-01 ENCOUNTER — Other Ambulatory Visit: Payer: Self-pay

## 2019-07-01 VITALS — BP 150/68 | HR 78 | Temp 96.1°F | Resp 15 | Ht 62.5 in | Wt 170.2 lb

## 2019-07-01 DIAGNOSIS — M4802 Spinal stenosis, cervical region: Secondary | ICD-10-CM | POA: Diagnosis not present

## 2019-07-01 DIAGNOSIS — E1169 Type 2 diabetes mellitus with other specified complication: Secondary | ICD-10-CM | POA: Diagnosis not present

## 2019-07-01 DIAGNOSIS — I10 Essential (primary) hypertension: Secondary | ICD-10-CM

## 2019-07-01 DIAGNOSIS — E119 Type 2 diabetes mellitus without complications: Secondary | ICD-10-CM

## 2019-07-01 DIAGNOSIS — Z950 Presence of cardiac pacemaker: Secondary | ICD-10-CM | POA: Diagnosis not present

## 2019-07-01 DIAGNOSIS — I429 Cardiomyopathy, unspecified: Secondary | ICD-10-CM | POA: Diagnosis not present

## 2019-07-01 DIAGNOSIS — I420 Dilated cardiomyopathy: Secondary | ICD-10-CM | POA: Diagnosis not present

## 2019-07-01 DIAGNOSIS — E785 Hyperlipidemia, unspecified: Secondary | ICD-10-CM

## 2019-07-01 DIAGNOSIS — H5711 Ocular pain, right eye: Secondary | ICD-10-CM | POA: Insufficient documentation

## 2019-07-01 NOTE — Assessment & Plan Note (Signed)
she reports compliance with medication regimen  but has an elevated reading today in office.  She has been  using NSAIDs daily.  Discussed goal of 120/70  (130/80 for patients over 70)  to preserve renal function.  She has been asked to suspend hctz due to gout flare,  check her  BP  at home and  submit readings for evaluation. Renal function, electrolytes and screen for proteinuria are all normal .  Will increase losartan to 50 mg   for BP > 140/90  Lab Results  Component Value Date   CREATININE 0.65 06/29/2019   Lab Results  Component Value Date   NA 141 06/29/2019   K 4.2 06/29/2019   CL 104 06/29/2019   CO2 32 06/29/2019   Lab Results  Component Value Date   MICROALBUR 1.5 06/29/2019   MICROALBUR 0.8 10/18/2018

## 2019-07-01 NOTE — Assessment & Plan Note (Signed)
Urgent referral to [facility] to have her orbital pressure evaluated given recent episode of pai nand blurred vision lasting 3 hours  (2 weeks ago )

## 2019-07-01 NOTE — Assessment & Plan Note (Signed)
She has been having increased headaches lately  But denies numbness of hands. Using tylenol , heating pad.  Robaxin

## 2019-07-01 NOTE — Patient Instructions (Addendum)
Your diabetes remains under excellent control  And your cholesterol is the lowest it has ever been,   Your other labs are also normal. Please continue your current medications. return in 6 months for follow up on diabetes    Suspend the htcz since it may be causing your gout flare  If your bp stays above 140/90,  increase the losartan to 50 mg daily   If you develop fluid retention let me know   I will make the referral to [facility] to have your pressure examined

## 2019-07-01 NOTE — Assessment & Plan Note (Signed)
Improved control with altered eating habits.  No changes today  Lab Results  Component Value Date   HGBA1C 6.4 06/29/2019   Lab Results  Component Value Date   MICROALBUR 1.5 06/29/2019   MICROALBUR 0.8 10/18/2018

## 2019-07-01 NOTE — Progress Notes (Signed)
Subjective:  Patient ID: Julia Pierce, female    DOB: 1956/03/04  Age: 64 y.o. MRN: [id number]  CC: The primary encounter diagnosis was Diabetes mellitus without complication ([place]). Diagnoses of Essential hypertension, Eye pain, right, Cardiomyopathy, unspecified type ([place]), Cervical stenosis of spine, and Pain around right eye were also pertinent to this visit.  HPI Julia Pierce presents for follow up on type 2 DM  This visit occurred during the SARS-CoV-2 public health emergency.  Safety protocols were in place, including screening questions prior to the visit, additional usage of staff PPE, and extensive cleaning of exam room while observing appropriate contact time as indicated for disinfecting solutions.   Pain and redness and warmth affecting her right heel,  Attributed to gout  Symptoms stated one week ago,  Has been using ibuprofen 200 to 400 mg   To relieve symptoms .  Has not had another flare in a year no known trigger except mild dehydration from activities    Patient is taking her medications as prescribed and notes no adverse effects.  Home BP readings have been done about once per week and are  generally < 130/80 .  She is avoiding added salt in her diet and walking regularly about 3 times per week for exercise.  Reporting increased frequency of headaches,  Feels they are coming from neck    Had pain in the right supraorbital area (not the eye)  that was  Severe  And lasted several hours.  It hurt to move the eye and the vision was blurred during the episode ,  Occurred 2 weeks ago.   Diarrhea has improved by eliminating corn syrup  From diet.    Loose stools are not occurring daily since she has done that   Outpatient Medications Prior to Visit  Medication Sig Dispense Refill  . Accu-Chek FastClix Lancets MISC TEST ONCE DAILY E11.9 102 each 1  . ACCU-CHEK GUIDE test strip TEST ONCE DAILY E11.9 100 strip 1  . Azelaic Acid 15 % cream APPLY TO AFFECTED AREA EVERY DAY    . B  Complex Vitamins (B COMPLEX PO) Take 1 tablet by mouth daily.     . blood glucose meter kit and supplies Dispense based on patient and insurance preference. Use to check blood sugar once daily. (FOR ICD-10 E11.9). 1 each 0  . calcium elemental as carbonate (BARIATRIC TUMS ULTRA) 400 MG chewable tablet Chew by mouth.    . Cholecalciferol (VITAMIN D3) 2000 UNITS TABS Take 1 tablet by mouth daily.    Marland Kitchen esomeprazole (NEXIUM) 20 MG capsule Take 10 mg by mouth daily at 12 noon.     . fluticasone (FLONASE) 50 MCG/ACT nasal spray Place 2 sprays into both nostrils daily.    Marland Kitchen JANUVIA 50 MG tablet TAKE 1 TABLET BY MOUTH EVERY DAY 90 tablet 1  . losartan (COZAAR) 25 MG tablet TAKE 1 TABLET (25 MG TOTAL) BY MOUTH DAILY. AT NIGHT 90 tablet 1  . methocarbamol (ROBAXIN) 500 MG tablet TAKE 1 TABLET BY MOUTH EVERY DAY OR 2 TO 3 TIMES DAILY AS NEEDED 90 tablet 5  . metoprolol tartrate (LOPRESSOR) 25 MG tablet Take 12.5 mg by mouth 2 (two) times daily. Reported on 05/17/2015    . Multiple Vitamin (MULTIVITAMIN) tablet Take 1 tablet by mouth daily.      . sertraline (ZOLOFT) 100 MG tablet TAKE 1 TABLET BY MOUTH EVERY DAY 90 tablet 1  . tolterodine (DETROL) 2 MG tablet TAKE 1 TABLET TWICE A DAY  180 tablet 3  . hydrochlorothiazide (HYDRODIURIL) 12.5 MG tablet Take 12.5 mg by mouth every morning. Takes 1/2 tablet daily in the morning.     Facility-Administered Medications Prior to Visit  Medication Dose Route Frequency Provider Last Rate Last Admin  . diclofenac sodium (VOLTAREN) 1 % transdermal gel 4 g  4 g Topical QID Mohammed Kindle, MD      . midazolam (VERSED) 5 MG/5ML injection 5 mg  5 mg Intravenous Once Mohammed Kindle, MD      . orphenadrine (NORFLEX) injection 60 mg  60 mg Intramuscular Once Mohammed Kindle, MD      . sodium chloride 0.9 % injection 20 mL  20 mL Other Once Mohammed Kindle, MD      . triamcinolone acetonide (KENALOG-40) injection 40 mg  40 mg Other Once Mohammed Kindle, MD        Review of  Systems;  Patient denies headache, fevers, malaise, unintentional weight loss, skin rash, eye pain, sinus congestion and sinus pain, sore throat, dysphagia,  hemoptysis , cough, dyspnea, wheezing, chest pain, palpitations, orthopnea, edema, abdominal pain, nausea, melena, diarrhea, constipation, flank pain, dysuria, hematuria, urinary  Frequency, nocturia, numbness, tingling, seizures,  Focal weakness, Loss of consciousness,  Tremor, insomnia, depression, anxiety, and suicidal ideation.      Objective:  BP (!) 150/68 (BP Location: Left Arm, Patient Position: Sitting, Cuff Size: Large)   Pulse 78   Temp (!) 96.1 F (35.6 C) (Temporal)   Resp 15   Ht 5' 2.5" (1.588 m)   Wt 170 lb 3.2 oz (77.2 kg)   SpO2 97%   BMI 30.63 kg/m   BP Readings from Last 3 Encounters:  07/01/19 (!) 150/68  04/20/19 138/74  06/23/18 132/86    Wt Readings from Last 3 Encounters:  07/01/19 170 lb 3.2 oz (77.2 kg)  04/20/19 173 lb (78.5 kg)  03/31/19 169 lb (76.7 kg)    General appearance: alert, cooperative and appears stated age Ears: normal TM's and external ear canals both ears Throat: lips, mucosa, and tongue normal; teeth and gums normal Neck: no adenopathy, no carotid bruit, supple, symmetrical, trachea midline and thyroid not enlarged, symmetric, no tenderness/mass/nodules Back: symmetric, no curvature. ROM normal. No CVA tenderness. Lungs: clear to auscultation bilaterally Heart: regular rate and rhythm, S1, S2 normal, no murmur, click, rub or gallop Abdomen: soft, non-tender; bowel sounds normal; no masses,  no organomegaly Pulses: 2+ and symmetric Skin: Skin color, texture, turgor normal. No rashes or lesions Lymph nodes: Cervical, supraclavicular, and axillary nodes normal.  Lab Results  Component Value Date   HGBA1C 6.4 06/29/2019   HGBA1C 6.4 03/29/2019   HGBA1C 6.7 (H) 10/18/2018    Lab Results  Component Value Date   CREATININE 0.65 06/29/2019   CREATININE 0.66 03/29/2019    CREATININE 0.79 10/18/2018    Lab Results  Component Value Date   WBC 8.0 09/11/2016   HGB 13.6 09/11/2016   HCT 40.8 09/11/2016   PLT 257.0 09/11/2016   GLUCOSE 146 (H) 06/29/2019   CHOL 214 (H) 06/29/2019   TRIG 301.0 (H) 06/29/2019   HDL 41.10 06/29/2019   LDLDIRECT 126.0 06/29/2019   LDLCALC 88 08/17/2013   ALT 12 06/29/2019   AST 17 06/29/2019   NA 141 06/29/2019   K 4.2 06/29/2019   CL 104 06/29/2019   CREATININE 0.65 06/29/2019   BUN 16 06/29/2019   CO2 32 06/29/2019   TSH 3.80 12/12/2016   HGBA1C 6.4 06/29/2019   MICROALBUR 1.5 06/29/2019  CT CERVICAL SPINE WO CONTRAST  Result Date: 12/31/2016 CLINICAL DATA:  Bilateral hand numbness and weakness, worse on the left. Cervical radiculopathy. EXAM: CT CERVICAL SPINE WITHOUT CONTRAST TECHNIQUE: Multidetector CT imaging of the cervical spine was performed without intravenous contrast. Multiplanar CT image reconstructions were also generated. COMPARISON:  MRI of cervical spine 10/18/2012 FINDINGS: Alignment: This AP alignment is anatomic. There straightening of the normal cervical lordosis, similar the prior study. Skull base and vertebrae: The craniocervical junction is normal. Vertebral body heights are maintained. Endplate Schmorl's nodes are stable. Soft tissues and spinal canal: The soft tissues the neck demonstrate atherosclerotic calcifications at the aortic arch. 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 No significant adenopathy is present. The visualized salivary glands are within normal limits. Disc levels:  C2-3:  Negative. C3-4:  Negative. C4-5: Asymmetric left-sided uncovertebral spurring is noted. Mild left foraminal stenosis is stable. C5-6: A broad-based disc osteophyte complex present. Uncovertebral spurring is worse on the left. Moderate central and severe left foraminal stenosis is stable. C6-7: A broad-based disc osteophyte complex is present. There is progressive effacement of the ventral CSF. Moderate left and mild right  foraminal stenosis is stable. C7-T1:  Negative. Upper chest: The lung apices are clear. IMPRESSION: 1. Mild left foraminal narrowing at C3-4 is stable. 2. Moderate central and severe left foraminal stenosis at C5-6 is similar the prior exam. 3. Moderate left and mild right foraminal stenosis at C6-7 is similar the prior study. 4. Progressive central in left central canal stenosis at C6-7. Electronically Signed   By: San Morelle M.D.   On: 12/31/2016 15:04    Assessment & Plan:   Problem List Items Addressed This Visit      Unprioritized   Hypertension    she reports compliance with medication regimen  but has an elevated reading today in office.  She has been  using NSAIDs daily.  Discussed goal of 120/70  (130/80 for patients over 70)  to preserve renal function.  She has been asked to suspend hctz due to gout flare,  check her  BP  at home and  submit readings for evaluation. Renal function, electrolytes and screen for proteinuria are all normal .  Will increase losartan to 50 mg   for BP > 140/90  Lab Results  Component Value Date   CREATININE 0.65 06/29/2019   Lab Results  Component Value Date   NA 141 06/29/2019   K 4.2 06/29/2019   CL 104 06/29/2019   CO2 32 06/29/2019   Lab Results  Component Value Date   MICROALBUR 1.5 06/29/2019   MICROALBUR 0.8 10/18/2018           Cardiomyopathy ([place])    S/p pacer, managed by [name].  Asymptomatic.  Encouraged continued exercise      Diabetes mellitus without complication ([place]) - Primary    Improved control with altered eating habits.  No changes today  Lab Results  Component Value Date   HGBA1C 6.4 06/29/2019   Lab Results  Component Value Date   MICROALBUR 1.5 06/29/2019   MICROALBUR 0.8 10/18/2018           Relevant Orders   Hemoglobin A1c   Comprehensive metabolic panel   Lipid panel   Cervical stenosis of spine    She has been having increased headaches lately  But denies numbness of hands. Using  tylenol , heating pad.  Robaxin       Pain around right eye    Urgent referral to [facility] to  have her orbital pressure evaluated given recent episode of pai nand blurred vision lasting 3 hours  (2 weeks ago )        Other Visit Diagnoses    Eye pain, right       Relevant Orders   Ambulatory referral to Ophthalmology     I provided  30 minutes of face-to-face time during this encounter reviewing patient's current problems and past surgeries, labs and imaging studies, providing counseling on the above mentioned problems , and coordination  of care .  I have discontinued Nieves Chapa "Susie"'s hydrochlorothiazide. I am also having her maintain her multivitamin, B Complex Vitamins (B COMPLEX PO), Vitamin D3, esomeprazole, fluticasone, metoprolol tartrate, calcium elemental as carbonate, Azelaic Acid, tolterodine, sertraline, losartan, blood glucose meter kit and supplies, Januvia, methocarbamol, Accu-Chek Guide, and Accu-Chek FastClix Lancets. We will continue to administer midazolam, orphenadrine, sodium chloride, triamcinolone acetonide, and diclofenac sodium.  No orders of the defined types were placed in this encounter.   Medications Discontinued During This Encounter  Medication Reason  . hydrochlorothiazide (HYDRODIURIL) 12.5 MG tablet     Follow-up: Return in about 6 months (around 01/01/2020) for follow up diabetes.   Crecencio Mc, MD

## 2019-07-01 NOTE — Assessment & Plan Note (Signed)
S/p pacer, managed by [name].  Asymptomatic.  Encouraged continued exercise

## 2019-07-07 DIAGNOSIS — M3501 Sicca syndrome with keratoconjunctivitis: Secondary | ICD-10-CM | POA: Diagnosis not present

## 2019-07-08 ENCOUNTER — Other Ambulatory Visit: Payer: Self-pay | Admitting: Internal Medicine

## 2019-07-08 DIAGNOSIS — Z03818 Encounter for observation for suspected exposure to other biological agents ruled out: Secondary | ICD-10-CM | POA: Diagnosis not present

## 2019-07-08 DIAGNOSIS — Z20828 Contact with and (suspected) exposure to other viral communicable diseases: Secondary | ICD-10-CM | POA: Diagnosis not present

## 2019-07-29 ENCOUNTER — Ambulatory Visit: Payer: BC Managed Care – PPO | Attending: Internal Medicine

## 2019-07-29 DIAGNOSIS — Z23 Encounter for immunization: Secondary | ICD-10-CM

## 2019-07-29 NOTE — Progress Notes (Signed)
   Covid-19 Vaccination Clinic  Name:  Julia Pierce    MRN: [id number] DOB: [birth year]  07/29/2019  [name] was observed post Covid-19 immunization for 30 minutes based on pre-vaccination screening without incident. She was provided with Vaccine Information Sheet and instruction to access the V-Safe system.   [name] was instructed to call 911 with any severe reactions post vaccine: Marland Kitchen Difficulty breathing  . Swelling of face and throat  . A fast heartbeat  . A bad rash all over body  . Dizziness and weakness   Immunizations Administered    Name Date Dose VIS Date Route   Pfizer COVID-19 Vaccine 07/29/2019  9:17 AM 0.3 mL 04/01/2019 Intramuscular   Manufacturer: [place]   Lot: [id number]   [place]: [id number]

## 2019-07-30 ENCOUNTER — Other Ambulatory Visit: Payer: Self-pay | Admitting: Internal Medicine

## 2019-08-08 ENCOUNTER — Other Ambulatory Visit: Payer: Self-pay | Admitting: Internal Medicine

## 2019-08-23 ENCOUNTER — Ambulatory Visit: Payer: BC Managed Care – PPO | Attending: Internal Medicine

## 2019-08-23 DIAGNOSIS — Z23 Encounter for immunization: Secondary | ICD-10-CM

## 2019-08-23 NOTE — Progress Notes (Signed)
   Covid-19 Vaccination Clinic  Name:  Julia Pierce    MRN: [id number] DOB: 01-29-1956  08/23/2019  [name] was observed post Covid-19 immunization for 30 minutes based on pre-vaccination screening without incident. She was provided with Vaccine Information Sheet and instruction to access the V-Safe system.   [name] was instructed to call 911 with any severe reactions post vaccine: Marland Kitchen Difficulty breathing  . Swelling of face and throat  . A fast heartbeat  . A bad rash all over body  . Dizziness and weakness   Immunizations Administered    Name Date Dose VIS Date Route   Pfizer COVID-19 Vaccine 08/23/2019 10:20 AM 0.3 mL 06/15/2018 Intramuscular   Manufacturer: [place]   Lot: [id number]   [place]: [id number]

## 2019-09-21 ENCOUNTER — Ambulatory Visit (INDEPENDENT_AMBULATORY_CARE_PROVIDER_SITE_OTHER): Payer: BC Managed Care – PPO | Admitting: *Deleted

## 2019-09-21 DIAGNOSIS — I429 Cardiomyopathy, unspecified: Secondary | ICD-10-CM | POA: Diagnosis not present

## 2019-09-21 LAB — CUP PACEART REMOTE DEVICE CHECK
Battery Remaining Longevity: 25 mo
Battery Voltage: 2.93 V
Brady Statistic AP VP Percent: 0.01 %
Brady Statistic AP VS Percent: 0 %
Brady Statistic AS VP Percent: 99.97 %
Brady Statistic AS VS Percent: 0.02 %
Brady Statistic RA Percent Paced: 0.01 %
Brady Statistic RV Percent Paced: 99.98 %
Date Time Interrogation Session: 20210602144048
Implantable Lead Implant Date: 20150212
Implantable Lead Implant Date: 20150212
Implantable Lead Implant Date: 20150212
Implantable Lead Location: 753858
Implantable Lead Location: 753859
Implantable Lead Location: 753860
Implantable Lead Model: 4194
Implantable Lead Model: 5076
Implantable Lead Model: 5076
Implantable Pulse Generator Implant Date: 20150212
Lead Channel Impedance Value: 361 Ohm
Lead Channel Impedance Value: 361 Ohm
Lead Channel Impedance Value: 380 Ohm
Lead Channel Impedance Value: 399 Ohm
Lead Channel Impedance Value: 418 Ohm
Lead Channel Impedance Value: 475 Ohm
Lead Channel Impedance Value: 475 Ohm
Lead Channel Impedance Value: 532 Ohm
Lead Channel Impedance Value: 570 Ohm
Lead Channel Pacing Threshold Amplitude: 0.75 V
Lead Channel Pacing Threshold Amplitude: 0.875 V
Lead Channel Pacing Threshold Amplitude: 1.875 V
Lead Channel Pacing Threshold Pulse Width: 0.4 ms
Lead Channel Pacing Threshold Pulse Width: 0.4 ms
Lead Channel Pacing Threshold Pulse Width: 0.4 ms
Lead Channel Sensing Intrinsic Amplitude: 1.75 mV
Lead Channel Sensing Intrinsic Amplitude: 1.75 mV
Lead Channel Sensing Intrinsic Amplitude: 14.75 mV
Lead Channel Sensing Intrinsic Amplitude: 18.375 mV
Lead Channel Setting Pacing Amplitude: 1.5 V
Lead Channel Setting Pacing Amplitude: 2 V
Lead Channel Setting Pacing Amplitude: 2.5 V
Lead Channel Setting Pacing Pulse Width: 0.4 ms
Lead Channel Setting Pacing Pulse Width: 0.8 ms
Lead Channel Setting Sensing Sensitivity: 0.9 mV

## 2019-09-22 NOTE — Progress Notes (Signed)
Remote pacemaker transmission.   

## 2019-09-27 ENCOUNTER — Other Ambulatory Visit: Payer: Self-pay | Admitting: Internal Medicine

## 2019-10-19 DIAGNOSIS — E119 Type 2 diabetes mellitus without complications: Secondary | ICD-10-CM | POA: Diagnosis not present

## 2019-10-19 LAB — HM DIABETES EYE EXAM

## 2019-10-26 ENCOUNTER — Other Ambulatory Visit: Payer: Self-pay | Admitting: Internal Medicine

## 2019-10-26 MED ORDER — LOSARTAN POTASSIUM 50 MG PO TABS: 50.0000 mg | ORAL_TABLET | Freq: Every day | ORAL | 1 refills | Status: DC

## 2019-11-25 ENCOUNTER — Other Ambulatory Visit: Payer: Self-pay | Admitting: Internal Medicine

## 2019-12-01 DIAGNOSIS — Z872 Personal history of diseases of the skin and subcutaneous tissue: Secondary | ICD-10-CM | POA: Diagnosis not present

## 2019-12-01 DIAGNOSIS — L578 Other skin changes due to chronic exposure to nonionizing radiation: Secondary | ICD-10-CM | POA: Diagnosis not present

## 2019-12-01 DIAGNOSIS — M720 Palmar fascial fibromatosis [Dupuytren]: Secondary | ICD-10-CM | POA: Diagnosis not present

## 2019-12-01 DIAGNOSIS — Z86018 Personal history of other benign neoplasm: Secondary | ICD-10-CM | POA: Diagnosis not present

## 2019-12-14 ENCOUNTER — Other Ambulatory Visit: Payer: Self-pay | Admitting: Internal Medicine

## 2019-12-21 ENCOUNTER — Ambulatory Visit (INDEPENDENT_AMBULATORY_CARE_PROVIDER_SITE_OTHER): Payer: HMO | Admitting: *Deleted

## 2019-12-21 DIAGNOSIS — I429 Cardiomyopathy, unspecified: Secondary | ICD-10-CM | POA: Diagnosis not present

## 2019-12-21 LAB — CUP PACEART REMOTE DEVICE CHECK
Battery Remaining Longevity: 23 mo
Battery Voltage: 2.92 V
Brady Statistic AP VP Percent: 0.02 %
Brady Statistic AP VS Percent: 0 %
Brady Statistic AS VP Percent: 99.96 %
Brady Statistic AS VS Percent: 0.02 %
Brady Statistic RA Percent Paced: 0.02 %
Brady Statistic RV Percent Paced: 99.98 %
Date Time Interrogation Session: 20210901155302
Implantable Lead Implant Date: 20150212
Implantable Lead Implant Date: 20150212
Implantable Lead Implant Date: 20150212
Implantable Lead Location: 753858
Implantable Lead Location: 753859
Implantable Lead Location: 753860
Implantable Lead Model: 4194
Implantable Lead Model: 5076
Implantable Lead Model: 5076
Implantable Pulse Generator Implant Date: 20150212
Lead Channel Impedance Value: 342 Ohm
Lead Channel Impedance Value: 380 Ohm
Lead Channel Impedance Value: 380 Ohm
Lead Channel Impedance Value: 418 Ohm
Lead Channel Impedance Value: 437 Ohm
Lead Channel Impedance Value: 456 Ohm
Lead Channel Impedance Value: 494 Ohm
Lead Channel Impedance Value: 532 Ohm
Lead Channel Impedance Value: 551 Ohm
Lead Channel Pacing Threshold Amplitude: 0.625 V
Lead Channel Pacing Threshold Amplitude: 1 V
Lead Channel Pacing Threshold Amplitude: 1.875 V
Lead Channel Pacing Threshold Pulse Width: 0.4 ms
Lead Channel Pacing Threshold Pulse Width: 0.4 ms
Lead Channel Pacing Threshold Pulse Width: 0.4 ms
Lead Channel Sensing Intrinsic Amplitude: 1.75 mV
Lead Channel Sensing Intrinsic Amplitude: 1.75 mV
Lead Channel Sensing Intrinsic Amplitude: 14.75 mV
Lead Channel Sensing Intrinsic Amplitude: 18.375 mV
Lead Channel Setting Pacing Amplitude: 1.5 V
Lead Channel Setting Pacing Amplitude: 2 V
Lead Channel Setting Pacing Amplitude: 2.5 V
Lead Channel Setting Pacing Pulse Width: 0.4 ms
Lead Channel Setting Pacing Pulse Width: 0.8 ms
Lead Channel Setting Sensing Sensitivity: 0.9 mV

## 2019-12-22 NOTE — Progress Notes (Signed)
Remote pacemaker transmission.   

## 2019-12-24 ENCOUNTER — Other Ambulatory Visit: Payer: Self-pay | Admitting: Internal Medicine

## 2019-12-28 DIAGNOSIS — I420 Dilated cardiomyopathy: Secondary | ICD-10-CM | POA: Diagnosis not present

## 2019-12-28 DIAGNOSIS — E782 Mixed hyperlipidemia: Secondary | ICD-10-CM | POA: Diagnosis not present

## 2019-12-28 DIAGNOSIS — I10 Essential (primary) hypertension: Secondary | ICD-10-CM | POA: Diagnosis not present

## 2019-12-28 DIAGNOSIS — Z950 Presence of cardiac pacemaker: Secondary | ICD-10-CM | POA: Diagnosis not present

## 2019-12-29 ENCOUNTER — Other Ambulatory Visit: Payer: Self-pay

## 2019-12-29 ENCOUNTER — Other Ambulatory Visit (INDEPENDENT_AMBULATORY_CARE_PROVIDER_SITE_OTHER): Payer: HMO

## 2019-12-29 DIAGNOSIS — E119 Type 2 diabetes mellitus without complications: Secondary | ICD-10-CM

## 2019-12-29 LAB — COMPREHENSIVE METABOLIC PANEL
ALT: 15 U/L (ref 0–35)
AST: 19 U/L (ref 0–37)
Albumin: 4.4 g/dL (ref 3.5–5.2)
Alkaline Phosphatase: 109 U/L (ref 39–117)
BUN: 20 mg/dL (ref 6–23)
CO2: 28 mEq/L (ref 19–32)
Calcium: 9.4 mg/dL (ref 8.4–10.5)
Chloride: 103 mEq/L (ref 96–112)
Creatinine, Ser: 0.76 mg/dL (ref 0.40–1.20)
GFR: 76.27 mL/min (ref >60.00)
Glucose, Bld: 126 mg/dL — ABNORMAL HIGH (ref 70–99)
Potassium: 3.9 mEq/L (ref 3.5–5.1)
Sodium: 142 mEq/L (ref 135–145)
Total Bilirubin: 0.5 mg/dL (ref 0.2–1.2)
Total Protein: 6.8 g/dL (ref 6.0–8.3)

## 2019-12-29 LAB — HEMOGLOBIN A1C: Hgb A1c MFr Bld: 6.5 % (ref 4.6–6.5)

## 2019-12-29 LAB — LIPID PANEL
Cholesterol: 235 mg/dL — ABNORMAL HIGH (ref 0–200)
HDL: 42.4 mg/dL (ref >39.00)
NonHDL: 192.66
Total CHOL/HDL Ratio: 6
Triglycerides: 351 mg/dL — ABNORMAL HIGH (ref 0.0–149.0)
VLDL: 70.2 mg/dL — ABNORMAL HIGH (ref 0.0–40.0)

## 2019-12-29 LAB — LDL CHOLESTEROL, DIRECT: Direct LDL: 142 mg/dL

## 2019-12-30 ENCOUNTER — Other Ambulatory Visit: Payer: Self-pay | Admitting: Internal Medicine

## 2020-01-02 ENCOUNTER — Encounter: Payer: Self-pay | Admitting: Internal Medicine

## 2020-01-02 ENCOUNTER — Ambulatory Visit (INDEPENDENT_AMBULATORY_CARE_PROVIDER_SITE_OTHER): Payer: HMO | Admitting: Internal Medicine

## 2020-01-02 ENCOUNTER — Other Ambulatory Visit: Payer: Self-pay

## 2020-01-02 DIAGNOSIS — E119 Type 2 diabetes mellitus without complications: Secondary | ICD-10-CM | POA: Diagnosis not present

## 2020-01-02 DIAGNOSIS — E669 Obesity, unspecified: Secondary | ICD-10-CM | POA: Diagnosis not present

## 2020-01-02 DIAGNOSIS — I10 Essential (primary) hypertension: Secondary | ICD-10-CM

## 2020-01-02 DIAGNOSIS — F339 Major depressive disorder, recurrent, unspecified: Secondary | ICD-10-CM | POA: Diagnosis not present

## 2020-01-02 MED ORDER — AMLODIPINE BESYLATE 2.5 MG PO TABS: 2.5000 mg | ORAL_TABLET | Freq: Every day | ORAL | 3 refills | Status: DC

## 2020-01-02 MED ORDER — BUPROPION HCL ER (XL) 150 MG PO TB24: 150.0000 mg | ORAL_TABLET | Freq: Every day | ORAL | 2 refills | Status: DC

## 2020-01-02 NOTE — Assessment & Plan Note (Signed)
I have addressed  BMI and recommended a low glycemic index diet utilizing smaller more frequent meals to increase metabolism.  I have also recommended that patient start exercising with a goal of 30 minutes of aerobic exercise a minimum of 5 days per week.  

## 2020-01-02 NOTE — Assessment & Plan Note (Signed)
Improved control with altered eating habits.  No changes today.  Continue Januvia   Lab Results  Component Value Date   HGBA1C 6.5 12/29/2019   Lab Results  Component Value Date   MICROALBUR 1.5 06/29/2019   MICROALBUR 0.8 10/18/2018

## 2020-01-02 NOTE — Assessment & Plan Note (Signed)
Not at goal on 100 mg losartan and 25 mg metoprolol.  Having excessive fatigue.  So adding amlodipine 2.5 mg daily rather than resuming hctz or increasing the metoprolol

## 2020-01-02 NOTE — Assessment & Plan Note (Signed)
Trial of wellbutrin to address concentration and fatigue .  Continue sertraline for the anxiety

## 2020-01-02 NOTE — Patient Instructions (Addendum)
I am adding amlodipine 2.5 mg daily to get your blood pressure  Under control  Continue losartan 100 mg and metoprolol 25 mg total daily dose   I am recommending a trial of wellbutrin for your depression .  Take it once daily with breakfast.  Continue sertraline to manage your anxiety   Follow up in one month (virtual ok)    Bupropion extended-release tablets (Depression/Mood Disorders) What is this medicine? BUPROPION (byoo PROE pee on) is used to treat depression. This medicine may be used for other purposes; ask your health care provider or pharmacist if you have questions. COMMON BRAND NAME(S): Aplenzin, Budeprion XL, Forfivo XL, Wellbutrin XL What should I tell my health care provider before I take this medicine? They need to know if you have any of these conditions:  an eating disorder, such as anorexia or bulimia  bipolar disorder or psychosis  diabetes or high blood sugar, treated with medication  glaucoma  head injury or brain tumor  heart disease, previous heart attack, or irregular heart beat  high blood pressure  kidney or liver disease  seizures (convulsions)  suicidal thoughts or a previous suicide attempt  Tourette's syndrome  weight loss  an unusual or allergic reaction to bupropion, other medicines, foods, dyes, or preservatives  breast-feeding  pregnant or trying to become pregnant How should I use this medicine? Take this medicine by mouth with a glass of water. Follow the directions on the prescription label. You can take it with or without food. If it upsets your stomach, take it with food. Do not crush, chew, or cut these tablets. This medicine is taken once daily at the same time each day. Do not take your medicine more often than directed. Do not stop taking this medicine suddenly except upon the advice of your doctor. Stopping this medicine too quickly may cause serious side effects or your condition may worsen. A special MedGuide will be  given to you by the pharmacist with each prescription and refill. Be sure to read this information carefully each time. Talk to your pediatrician regarding the use of this medicine in children. Special care may be needed. Overdosage: If you think you have taken too much of this medicine contact a poison control center or emergency room at once. NOTE: This medicine is only for you. Do not share this medicine with others. What if I miss a dose? If you miss a dose, skip the missed dose and take your next tablet at the regular time. Do not take double or extra doses. What may interact with this medicine? Do not take this medicine with any of the following medications:  linezolid  MAOIs like Azilect, Carbex, Eldepryl, Marplan, Nardil, and Parnate  methylene blue (injected into a vein)  other medicines that contain bupropion like Zyban This medicine may also interact with the following medications:  alcohol  certain medicines for anxiety or sleep  certain medicines for blood pressure like metoprolol, propranolol  certain medicines for depression or psychotic disturbances  certain medicines for HIV or AIDS like efavirenz, lopinavir, nelfinavir, ritonavir  certain medicines for irregular heart beat like propafenone, flecainide  certain medicines for Parkinson's disease like amantadine, levodopa  certain medicines for seizures like carbamazepine, phenytoin, phenobarbital  cimetidine  clopidogrel  cyclophosphamide  digoxin  furazolidone  isoniazid  nicotine  orphenadrine  procarbazine  steroid medicines like prednisone or cortisone  stimulant medicines for attention disorders, weight loss, or to stay awake  tamoxifen  theophylline  thiotepa  ticlopidine  tramadol  warfarin This list may not describe all possible interactions. Give your health care provider a list of all the medicines, herbs, non-prescription drugs, or dietary supplements you use. Also tell  them if you smoke, drink alcohol, or use illegal drugs. Some items may interact with your medicine. What should I watch for while using this medicine? Tell your doctor if your symptoms do not get better or if they get worse. Visit your doctor or healthcare provider for regular checks on your progress. Because it may take several weeks to see the full effects of this medicine, it is important to continue your treatment as prescribed by your doctor. This medicine may cause serious skin reactions. They can happen weeks to months after starting the medicine. Contact your healthcare provider right away if you notice fevers or flu-like symptoms with a rash. The rash may be red or purple and then turn into blisters or peeling of the skin. Or, you might notice a red rash with swelling of the face, lips or lymph nodes in your neck or under your arms. Patients and their families should watch out for new or worsening thoughts of suicide or depression. Also watch out for sudden changes in feelings such as feeling anxious, agitated, panicky, irritable, hostile, aggressive, impulsive, severely restless, overly excited and hyperactive, or not being able to sleep. If this happens, especially at the beginning of treatment or after a change in dose, call your healthcare provider. Avoid alcoholic drinks while taking this medicine. Drinking large amounts of alcoholic beverages, using sleeping or anxiety medicines, or quickly stopping the use of these agents while taking this medicine may increase your risk for a seizure. Do not drive or use heavy machinery until you know how this medicine affects you. This medicine can impair your ability to perform these tasks. Do not take this medicine close to bedtime. It may prevent you from sleeping. Your mouth may get dry. Chewing sugarless gum or sucking hard candy, and drinking plenty of water may help. Contact your doctor if the problem does not go away or is severe. The tablet shell  for some brands of this medicine does not dissolve. This is normal. The tablet shell may appear whole in the stool. This is not a cause for concern. What side effects may I notice from receiving this medicine? Side effects that you should report to your doctor or health care professional as soon as possible:  allergic reactions like skin rash, itching or hives, swelling of the face, lips, or tongue  breathing problems  changes in vision  confusion  elevated mood, decreased need for sleep, racing thoughts, impulsive behavior  fast or irregular heartbeat  hallucinations, loss of contact with reality  increased blood pressure  rash, fever, and swollen lymph nodes  redness, blistering, peeling or loosening of the skin, including inside the mouth  seizures  suicidal thoughts or other mood changes  unusually weak or tired  vomiting Side effects that usually do not require medical attention (report to your doctor or health care professional if they continue or are bothersome):  constipation  headache  loss of appetite  nausea  tremors  weight loss This list may not describe all possible side effects. Call your doctor for medical advice about side effects. You may report side effects to FDA at 1-800-FDA-1088. Where should I keep my medicine? Keep out of the reach of children. Store at room temperature between 15 and 30 degrees C (59 and 86 degrees F). Throw away  any unused medicine after the expiration date. NOTE: This sheet is a summary. It may not cover all possible information. If you have questions about this medicine, talk to your doctor, pharmacist, or health care provider.  2020 Elsevier/Gold Standard (2018-07-01 13:45:31)

## 2020-01-02 NOTE — Progress Notes (Signed)
Subjective:  Patient ID: Julia Pierce, female    DOB: [birth year]  Age: 64 y.o. MRN: [id number]  CC: Diagnoses of Essential hypertension, Major depressive disorder, recurrent episode with anxious distress ([place]), Obesity (BMI 30.0-34.9), and Diabetes mellitus without complication ([place]) were pertinent to this visit.  HPI Julia Pierce presents for 6 moth follow up on chronic conditions  This visit occurred during the SARS-CoV-2 public health emergency.  Safety protocols were in place, including screening questions prior to the visit, additional usage of staff PPE, and extensive cleaning of exam room while observing appropriate contact time as indicated for disinfecting solutions.    Patient has received both doses of the available COVID 19 vaccine in April/May without complications.  Patient continues to mask when outside of the home except when walking in yard or at safe distances from others .  Patient denies any change in mood or development of unhealthy behaviors resuting from the pandemic's restriction of activities and socialization.    Husband had a stroke on May 25  , mild.  Still stressed out by her husband's delusional personality which has been heightened by current events. Also doesn't like her husband's primary care physician    T2DM:  She  feels generally well,  But is back to exercising regularly and trying to lose weight. Checking  blood sugars less than once daily at variable times, usually only if she feels she may be having a hypoglycemic event.  BS have been under 130 fasting and < 150 post prandially.  Denies any recent hypoglyemic events.  Taking Januvia  as directed. Following a carbohydrate modified diet 6 days per week. Denies numbness , burning and tingling of extremities. Appetite is good.   Hypertension: patient checks blood pressure twice weekly at home.  Readings have been for the most part > 140/80 at rest . Has increased losartan in the interim to 50 mg,  And at recent  cardiology visit dose was increased to 100 mg daily 5 days ago.  Taking metoprolol in divided doses  12.5 mg twice daily.  Patient is following a reduce salt diet most days and is taking medications as prescribed   Tried CBD oil orally for gout and overnight the redness resolved.     Discussed starting wellbutrin for Positive depression screen  Outpatient Medications Prior to Visit  Medication Sig Dispense Refill  . Accu-Chek FastClix Lancets MISC TEST ONCE DAILY E11.9 102 each 1  . ACCU-CHEK GUIDE test strip TEST ONCE DAILY E11.9 100 strip 1  . Azelaic Acid 15 % cream APPLY TO AFFECTED AREA EVERY DAY    . B Complex Vitamins (B COMPLEX PO) Take 1 tablet by mouth daily.     . blood glucose meter kit and supplies Dispense based on patient and insurance preference. Use to check blood sugar once daily. (FOR ICD-10 E11.9). 1 each 0  . calcium elemental as carbonate (BARIATRIC TUMS ULTRA) 400 MG chewable tablet Chew by mouth.    . Cholecalciferol (VITAMIN D3) 2000 UNITS TABS Take 1 tablet by mouth daily.    Marland Kitchen esomeprazole (NEXIUM) 20 MG capsule Take 10 mg by mouth daily at 12 noon.     . fluticasone (FLONASE) 50 MCG/ACT nasal spray Place 2 sprays into both nostrils daily.    Marland Kitchen JANUVIA 50 MG tablet TAKE 1 TABLET BY MOUTH EVERY DAY 90 tablet 1  . losartan (COZAAR) 100 MG tablet Take 100 mg by mouth daily.    . methocarbamol (ROBAXIN) 500 MG tablet TAKE 1  TABLET BY MOUTH EVERY DAY OR 2 TO 3 TIMES DAILY AS NEEDED 90 tablet 5  . metoprolol tartrate (LOPRESSOR) 25 MG tablet Take 12.5 mg by mouth 2 (two) times daily. Reported on 05/17/2015    . Multiple Vitamin (MULTIVITAMIN) tablet Take 1 tablet by mouth daily.      . sertraline (ZOLOFT) 100 MG tablet TAKE 1 TABLET BY MOUTH EVERY DAY 90 tablet 1  . tolterodine (DETROL) 2 MG tablet TAKE 1 TABLET TWICE A DAY 180 tablet 3  . losartan (COZAAR) 50 MG tablet Take 1 tablet (50 mg total) by mouth daily. At night (Patient not taking: Reported on 01/02/2020) 90  tablet 1   Facility-Administered Medications Prior to Visit  Medication Dose Route Frequency Provider Last Rate Last Admin  . diclofenac sodium (VOLTAREN) 1 % transdermal gel 4 g  4 g Topical QID Mohammed Kindle, MD      . midazolam (VERSED) 5 MG/5ML injection 5 mg  5 mg Intravenous Once Mohammed Kindle, MD      . orphenadrine (NORFLEX) injection 60 mg  60 mg Intramuscular Once Mohammed Kindle, MD      . sodium chloride 0.9 % injection 20 mL  20 mL Other Once Mohammed Kindle, MD      . triamcinolone acetonide (KENALOG-40) injection 40 mg  40 mg Other Once Mohammed Kindle, MD        Review of Systems;  Patient denies headache, fevers, malaise, unintentional weight loss, skin rash, eye pain, sinus congestion and sinus pain, sore throat, dysphagia,  hemoptysis , cough, dyspnea, wheezing, chest pain, palpitations, orthopnea, edema, abdominal pain, nausea, melena, diarrhea, constipation, flank pain, dysuria, hematuria, urinary  Frequency, nocturia, numbness, tingling, seizures,  Focal weakness, Loss of consciousness,  Tremor, insomnia, depression, anxiety, and suicidal ideation.      Objective:  BP (!) 168/84 (BP Location: Left Arm, Patient Position: Sitting, Cuff Size: Normal)   Pulse 86   Temp 98.1 F (36.7 C) (Oral)   Resp 16   Ht 5' 2.5" (1.588 m)   Wt 169 lb 9.6 oz (76.9 kg)   SpO2 98%   BMI 30.53 kg/m   BP Readings from Last 3 Encounters:  01/02/20 (!) 168/84  07/01/19 (!) 150/68  04/20/19 138/74    Wt Readings from Last 3 Encounters:  01/02/20 169 lb 9.6 oz (76.9 kg)  07/01/19 170 lb 3.2 oz (77.2 kg)  04/20/19 173 lb (78.5 kg)    General appearance: alert, cooperative and appears stated age Ears: normal TM's and external ear canals both ears Throat: lips, mucosa, and tongue normal; teeth and gums normal Neck: no adenopathy, no carotid bruit, supple, symmetrical, trachea midline and thyroid not enlarged, symmetric, no tenderness/mass/nodules Back: symmetric, no curvature. ROM  normal. No CVA tenderness. Lungs: clear to auscultation bilaterally Heart: regular rate and rhythm, S1, S2 normal, no murmur, click, rub or gallop Abdomen: soft, non-tender; bowel sounds normal; no masses,  no organomegaly Pulses: 2+ and symmetric Skin: Skin color, texture, turgor normal. No rashes or lesions Lymph nodes: Cervical, supraclavicular, and axillary nodes normal.  Lab Results  Component Value Date   HGBA1C 6.5 12/29/2019   HGBA1C 6.4 06/29/2019   HGBA1C 6.4 03/29/2019    Lab Results  Component Value Date   CREATININE 0.76 12/29/2019   CREATININE 0.65 06/29/2019   CREATININE 0.66 03/29/2019    Lab Results  Component Value Date   WBC 8.0 09/11/2016   HGB 13.6 09/11/2016   HCT 40.8 09/11/2016   PLT 257.0 09/11/2016  GLUCOSE 126 (H) 12/29/2019   CHOL 235 (H) 12/29/2019   TRIG 351.0 (H) 12/29/2019   HDL 42.40 12/29/2019   LDLDIRECT 142.0 12/29/2019   LDLCALC 88 08/17/2013   ALT 15 12/29/2019   AST 19 12/29/2019   NA 142 12/29/2019   K 3.9 12/29/2019   CL 103 12/29/2019   CREATININE 0.76 12/29/2019   BUN 20 12/29/2019   CO2 28 12/29/2019   TSH 3.80 12/12/2016   HGBA1C 6.5 12/29/2019   MICROALBUR 1.5 06/29/2019    CT CERVICAL SPINE WO CONTRAST  Result Date: 12/31/2016 CLINICAL DATA:  Bilateral hand numbness and weakness, worse on the left. Cervical radiculopathy. EXAM: CT CERVICAL SPINE WITHOUT CONTRAST TECHNIQUE: Multidetector CT imaging of the cervical spine was performed without intravenous contrast. Multiplanar CT image reconstructions were also generated. COMPARISON:  MRI of cervical spine 10/18/2012 FINDINGS: Alignment: This AP alignment is anatomic. There straightening of the normal cervical lordosis, similar the prior study. Skull base and vertebrae: The craniocervical junction is normal. Vertebral body heights are maintained. Endplate Schmorl's nodes are stable. Soft tissues and spinal canal: The soft tissues the neck demonstrate atherosclerotic  calcifications at the aortic arch. 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 No significant adenopathy is present. The visualized salivary glands are within normal limits. Disc levels:  C2-3:  Negative. C3-4:  Negative. C4-5: Asymmetric left-sided uncovertebral spurring is noted. Mild left foraminal stenosis is stable. C5-6: A broad-based disc osteophyte complex present. Uncovertebral spurring is worse on the left. Moderate central and severe left foraminal stenosis is stable. C6-7: A broad-based disc osteophyte complex is present. There is progressive effacement of the ventral CSF. Moderate left and mild right foraminal stenosis is stable. C7-T1:  Negative. Upper chest: The lung apices are clear. IMPRESSION: 1. Mild left foraminal narrowing at C3-4 is stable. 2. Moderate central and severe left foraminal stenosis at C5-6 is similar the prior exam. 3. Moderate left and mild right foraminal stenosis at C6-7 is similar the prior study. 4. Progressive central in left central canal stenosis at C6-7. Electronically Signed   By: San Morelle M.D.   On: 12/31/2016 15:04    Assessment & Plan:   Problem List Items Addressed This Visit      Unprioritized   Hypertension    Not at goal on 100 mg losartan and 25 mg metoprolol.  Having excessive fatigue.  So adding amlodipine 2.5 mg daily rather than resuming hctz or increasing the metoprolol       Relevant Medications   losartan (COZAAR) 100 MG tablet   amLODipine (NORVASC) 2.5 MG tablet   Obesity (BMI 30.0-34.9)    I have addressed  BMI and recommended a low glycemic index diet utilizing smaller more frequent meals to increase metabolism.  I have also recommended that patient start exercising with a goal of 30 minutes of aerobic exercise a minimum of 5 days per week.       Diabetes mellitus without complication ([place])    Improved control with altered eating habits.  No changes today.  Continue Januvia   Lab Results  Component Value Date   HGBA1C 6.5 12/29/2019     Lab Results  Component Value Date   MICROALBUR 1.5 06/29/2019   MICROALBUR 0.8 10/18/2018           Relevant Medications   losartan (COZAAR) 100 MG tablet   Major depressive disorder, recurrent episode with anxious distress ([facility])    Trial of wellbutrin to address concentration and fatigue .  Continue sertraline for the anxiety  Relevant Medications   buPROPion (WELLBUTRIN XL) 150 MG 24 hr tablet      I provided  30 minutes of  face-to-face time during this encounter reviewing patient's current problems and past surgeries, labs and imaging studies, providing counseling on the above mentioned problems , and coordination  of care .  I am having Vihana Kydd "Susie" start on amLODipine and buPROPion. I am also having her maintain her multivitamin, B Complex Vitamins (B COMPLEX PO), Vitamin D3, esomeprazole, fluticasone, metoprolol tartrate, calcium elemental as carbonate, Azelaic Acid, blood glucose meter kit and supplies, Accu-Chek FastClix Lancets, Januvia, methocarbamol, Accu-Chek Guide, tolterodine, sertraline, and losartan. We will continue to administer midazolam, orphenadrine, sodium chloride, triamcinolone acetonide, and diclofenac sodium.  Meds ordered this encounter  Medications  . amLODipine (NORVASC) 2.5 MG tablet    Sig: Take 1 tablet (2.5 mg total) by mouth daily.    Dispense:  90 tablet    Refill:  3  . buPROPion (WELLBUTRIN XL) 150 MG 24 hr tablet    Sig: Take 1 tablet (150 mg total) by mouth daily.    Dispense:  30 tablet    Refill:  2    Medications Discontinued During This Encounter  Medication Reason  . losartan (COZAAR) 50 MG tablet     Follow-up: Return in about 4 weeks (around 01/30/2020).   Crecencio Mc, MD

## 2020-01-23 ENCOUNTER — Other Ambulatory Visit: Payer: Self-pay

## 2020-01-23 NOTE — Patient Outreach (Signed)
  [place] [facility]) Care Management Chronic Special Needs Program    01/23/2020  Name: Julia Pierce, DOB: 1956/01/30  MRN: [id number]   Ms. Graciela Plato is enrolled in a chronic special needs plan for Diabetes. Telephone call to client for Health risk assessment review and initial outreach. HIPAA verified. Client states she is unable to speak with RNCM at this time and request call back at a later date.   PLAN; RNCM will attempt 2nd telephone call to client in 3 weeks.   Quinn Plowman RN,BSN,CCM [facility] [phone]

## 2020-01-26 ENCOUNTER — Other Ambulatory Visit: Payer: Self-pay | Admitting: Internal Medicine

## 2020-01-27 ENCOUNTER — Other Ambulatory Visit: Payer: Self-pay

## 2020-01-31 ENCOUNTER — Encounter: Payer: Self-pay | Admitting: Internal Medicine

## 2020-01-31 ENCOUNTER — Other Ambulatory Visit: Payer: Self-pay

## 2020-01-31 ENCOUNTER — Ambulatory Visit (INDEPENDENT_AMBULATORY_CARE_PROVIDER_SITE_OTHER): Payer: HMO | Admitting: Internal Medicine

## 2020-01-31 VITALS — BP 150/86 | HR 94 | Temp 98.2°F | Resp 16 | Ht 62.5 in | Wt 168.6 lb

## 2020-01-31 DIAGNOSIS — I10 Essential (primary) hypertension: Secondary | ICD-10-CM

## 2020-01-31 DIAGNOSIS — E119 Type 2 diabetes mellitus without complications: Secondary | ICD-10-CM

## 2020-01-31 DIAGNOSIS — Z23 Encounter for immunization: Secondary | ICD-10-CM | POA: Diagnosis not present

## 2020-01-31 MED ORDER — METOPROLOL TARTRATE 25 MG PO TABS: 25.0000 mg | ORAL_TABLET | Freq: Two times a day (BID) | ORAL | 1 refills | Status: DC

## 2020-01-31 NOTE — Progress Notes (Signed)
Subjective:  Patient ID: Julia Pierce, female    DOB: Mar 31, 1956  Age: 64 y.o. MRN: [id number]  CC: The primary encounter diagnosis was Diabetes mellitus without complication ([place]). Diagnoses of Essential hypertension and Primary hypertension were also pertinent to this visit.  HPI Aryella Besecker presents for one month follow up on hypertension WITH  Elevated readings.  Her cardiologist increased her losartan  to 100 mg daily 5 days prior to last visit.  She has been taking metoprolol tartrate 25 mg in the evening , losartan 100 mg at night and amlodipine 2.5 mg daily in the morning  Readings still elevated per home and here.  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  Head feels full at night when she lies down. Doesn't sleep well due to chronic neck pain from cervical spine spondylosis.     LAST SLEEP APNEA STUDY WAS OVER 5 YEARS AGO, ordered by [name] ad reportedly normal. No prior study for RAS  .   Outpatient Medications Prior to Visit  Medication Sig Dispense Refill  . Accu-Chek FastClix Lancets MISC TEST ONCE DAILY E11.9 102 each 1  . ACCU-CHEK GUIDE test strip TEST ONCE DAILY E11.9 100 strip 1  . amLODipine (NORVASC) 2.5 MG tablet Take 1 tablet (2.5 mg total) by mouth daily. 90 tablet 3  . Azelaic Acid 15 % cream APPLY TO AFFECTED AREA EVERY DAY    . B Complex Vitamins (B COMPLEX PO) Take 1 tablet by mouth daily.     . blood glucose meter kit and supplies Dispense based on patient and insurance preference. Use to check blood sugar once daily. (FOR ICD-10 E11.9). 1 each 0  . buPROPion (WELLBUTRIN XL) 150 MG 24 hr tablet TAKE 1 TABLET BY MOUTH EVERY DAY 90 tablet 1  . calcium elemental as carbonate (BARIATRIC TUMS ULTRA) 400 MG chewable tablet Chew by  mouth.    . Cholecalciferol (VITAMIN D3) 2000 UNITS TABS Take 1 tablet by mouth daily.    Marland Kitchen esomeprazole (NEXIUM) 20 MG capsule Take 10 mg by mouth daily at 12 noon.     . fluticasone (FLONASE) 50 MCG/ACT nasal spray Place 2 sprays into both nostrils daily.    Marland Kitchen JANUVIA 50 MG tablet TAKE 1 TABLET BY MOUTH EVERY DAY 90 tablet 1  . losartan (COZAAR) 100 MG tablet Take 100 mg by mouth daily.    . methocarbamol (ROBAXIN) 500 MG tablet TAKE 1 TABLET BY MOUTH EVERY DAY OR 2 TO 3 TIMES DAILY AS NEEDED 90 tablet 5  . Multiple Vitamin (MULTIVITAMIN) tablet Take 1 tablet by mouth daily.      . sertraline (ZOLOFT) 100 MG tablet TAKE 1 TABLET BY MOUTH EVERY DAY 90 tablet 1  . tolterodine (DETROL) 2 MG tablet TAKE 1 TABLET TWICE A DAY 180 tablet 3  . metoprolol tartrate (LOPRESSOR) 25 MG tablet Take 12.5 mg by mouth 2 (two) times daily. Reported on 05/17/2015     Facility-Administered Medications Prior to Visit  Medication Dose Route Frequency Provider Last Rate Last Admin  . diclofenac sodium (VOLTAREN) 1 % transdermal gel 4 g  4 g Topical QID Mohammed Kindle, MD      . midazolam (VERSED) 5 MG/5ML injection 5 mg  5 mg Intravenous Once  Mohammed Kindle, MD      . orphenadrine (NORFLEX) injection 60 mg  60 mg Intramuscular Once Mohammed Kindle, MD      . sodium chloride 0.9 % injection 20 mL  20 mL Other Once Mohammed Kindle, MD      . triamcinolone acetonide (KENALOG-40) injection 40 mg  40 mg Other Once Mohammed Kindle, MD        Review of Systems;  Patient denies  fevers, malaise, unintentional weight loss, skin rash, eye pain, sinus congestion and sinus pain, sore throat, dysphagia,  hemoptysis , cough, dyspnea, wheezing, chest pain, palpitations, orthopnea, edema, abdominal pain, nausea, melena, diarrhea, constipation, flank pain, dysuria, hematuria, urinary  Frequency, nocturia, numbness, tingling, seizures,  Focal weakness, Loss of consciousness,  Tremor, insomnia, depression, anxiety, and suicidal  ideation.      Objective:  BP (!) 150/86 (BP Location: Left Arm, Patient Position: Sitting, Cuff Size: Normal)   Pulse 94   Temp 98.2 F (36.8 C) (Oral)   Resp 16   Ht 5' 2.5" (1.588 m)   Wt 168 lb 9.6 oz (76.5 kg)   SpO2 96%   BMI 30.35 kg/m   BP Readings from Last 3 Encounters:  01/31/20 (!) 150/86  01/02/20 (!) 168/84  07/01/19 (!) 150/68    Wt Readings from Last 3 Encounters:  01/31/20 168 lb 9.6 oz (76.5 kg)  01/02/20 169 lb 9.6 oz (76.9 kg)  07/01/19 170 lb 3.2 oz (77.2 kg)    General appearance: alert, cooperative and appears stated age Ears: normal TM's and external ear canals both ears Throat: lips, mucosa, and tongue normal; teeth and gums normal Neck: no adenopathy, no carotid bruit, supple, symmetrical, trachea midline and thyroid not enlarged, symmetric, no tenderness/mass/nodules Back: symmetric, no curvature. ROM normal. No CVA tenderness. Lungs: clear to auscultation bilaterally Heart: regular rate and rhythm, S1, S2 normal, no murmur, click, rub or gallop Abdomen: soft, non-tender; bowel sounds normal; no masses,  no organomegaly Pulses: 2+ and symmetric Skin: Skin color, texture, turgor normal. No rashes or lesions Lymph nodes: Cervical, supraclavicular, and axillary nodes normal.  Lab Results  Component Value Date   HGBA1C 6.5 12/29/2019   HGBA1C 6.4 06/29/2019   HGBA1C 6.4 03/29/2019    Lab Results  Component Value Date   CREATININE 0.76 12/29/2019   CREATININE 0.65 06/29/2019   CREATININE 0.66 03/29/2019    Lab Results  Component Value Date   WBC 8.0 09/11/2016   HGB 13.6 09/11/2016   HCT 40.8 09/11/2016   PLT 257.0 09/11/2016   GLUCOSE 126 (H) 12/29/2019   CHOL 235 (H) 12/29/2019   TRIG 351.0 (H) 12/29/2019   HDL 42.40 12/29/2019   LDLDIRECT 142.0 12/29/2019   LDLCALC 88 08/17/2013   ALT 15 12/29/2019   AST 19 12/29/2019   NA 142 12/29/2019   K 3.9 12/29/2019   CL 103 12/29/2019   CREATININE 0.76 12/29/2019   BUN 20  12/29/2019   CO2 28 12/29/2019   TSH 3.80 12/12/2016   HGBA1C 6.5 12/29/2019   MICROALBUR 1.5 06/29/2019    CT CERVICAL SPINE WO CONTRAST  Result Date: 12/31/2016 CLINICAL DATA:  Bilateral hand numbness and weakness, worse on the left. Cervical radiculopathy. EXAM: CT CERVICAL SPINE WITHOUT CONTRAST TECHNIQUE: Multidetector CT imaging of the cervical spine was performed without intravenous contrast. Multiplanar CT image reconstructions were also generated. COMPARISON:  MRI of cervical spine 10/18/2012 FINDINGS: Alignment: This AP alignment is anatomic. There straightening of the normal cervical lordosis, similar the prior study.  Skull base and vertebrae: The craniocervical junction is normal. Vertebral body heights are maintained. Endplate Schmorl's nodes are stable. Soft tissues and spinal canal: The soft tissues the neck demonstrate atherosclerotic calcifications at the aortic arch. 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 No significant adenopathy is present. The visualized salivary glands are within normal limits. Disc levels:  C2-3:  Negative. C3-4:  Negative. C4-5: Asymmetric left-sided uncovertebral spurring is noted. Mild left foraminal stenosis is stable. C5-6: A broad-based disc osteophyte complex present. Uncovertebral spurring is worse on the left. Moderate central and severe left foraminal stenosis is stable. C6-7: A broad-based disc osteophyte complex is present. There is progressive effacement of the ventral CSF. Moderate left and mild right foraminal stenosis is stable. C7-T1:  Negative. Upper chest: The lung apices are clear. IMPRESSION: 1. Mild left foraminal narrowing at C3-4 is stable. 2. Moderate central and severe left foraminal stenosis at C5-6 is similar the prior exam. 3. Moderate left and mild right foraminal stenosis at C6-7 is similar the prior study. 4. Progressive central in left central canal stenosis at C6-7. Electronically Signed   By: San Morelle M.D.   On: 12/31/2016 15:04     Assessment & Plan:   Problem List Items Addressed This Visit      Unprioritized   Diabetes mellitus without complication ([place]) - Primary   Relevant Orders   Hemoglobin A1c   Microalbumin / creatinine urine ratio   Lipid panel   Hypertension    she reports compliance with medication regimen  But is not at goal.    She is not using NSAIDs daily.  Discussed goal of 130/80 for patients   to preserve renal function. Increase metoprolol fro 25 mg daily to 25 mg TWICE DAILY.  [facility] . Continue losartan 100 mg daily and amlodipine 2.5 mg daily . Advised patient that 2ndary causes will need to be ruled out if we cannot get BP controlled on maximal doses of 3 meds       Relevant Medications   metoprolol tartrate (LOPRESSOR) 25 MG tablet    Other Visit Diagnoses    Essential hypertension       Relevant Medications   metoprolol tartrate (LOPRESSOR) 25 MG tablet   Other Relevant Orders   Comprehensive metabolic panel     I provided  30 minutes of  face-to-face time during this encounter reviewing patient's current problems and past surgeries, labs and imaging studies, providing counseling on the above mentioned problems , and coordination  of care .  I have changed Seren Chaloux "Susie"'s metoprolol tartrate. I am also having her maintain her multivitamin, B Complex Vitamins (B COMPLEX PO), Vitamin D3, esomeprazole, fluticasone, calcium elemental as carbonate, Azelaic Acid, blood glucose meter kit and supplies, Accu-Chek FastClix Lancets, Januvia, methocarbamol, Accu-Chek Guide, tolterodine, sertraline, losartan, amLODipine, and buPROPion. We will continue to administer midazolam, orphenadrine, sodium chloride, triamcinolone acetonide, and diclofenac sodium.  Meds ordered this encounter  Medications  . metoprolol tartrate (LOPRESSOR) 25 MG tablet    Sig: Take 1 tablet (25 mg total) by mouth 2 (two) times daily. NOTE DOSE INCREASE.  KEEP ON FILE FOR FUTURE REFILLS    Dispense:   180 tablet    Refill:  1    Medications Discontinued During This Encounter  Medication Reason  . metoprolol tartrate (LOPRESSOR) 25 MG tablet     Follow-up: Return in about 9 weeks (around 04/03/2020) for follow up diabetes.   Crecencio Mc, MD

## 2020-01-31 NOTE — Patient Instructions (Addendum)
YOUR BP IS STILL ELEVATED,  OUR GOAL IS 140/80 OR LESS   INCREASE METOPROLOL TO 25 MG EVERY 12 HOURS (WHOLE TABLET TWICE DAILY)  CONTINUE AMLODIPINE 2.5 MG DAILY AND LOSARTAN 100 MG DAILY  IF YOUR BP AT HOME IS STILL ELEVATED  WE NEED TO RULE OUT   SLEEP APNEA RENAL ARTERY STENOSIS

## 2020-01-31 NOTE — Assessment & Plan Note (Addendum)
she reports compliance with medication regimen  But is not at goal.    She is not using NSAIDs daily.  Discussed goal of 130/80 for patients   to preserve renal function. Increase metoprolol fro 25 mg daily to 25 mg TWICE DAILY.  [facility] . Continue losartan 100 mg daily and amlodipine 2.5 mg daily . Advised patient that 2ndary causes will need to be ruled out if we cannot get BP controlled on maximal doses of 3 meds

## 2020-02-06 ENCOUNTER — Other Ambulatory Visit: Payer: Self-pay

## 2020-02-06 NOTE — Patient Outreach (Signed)
  [place] [facility]) Care Management Chronic Special Needs Program    02/06/2020  Name: Julia Pierce, DOB: [birth year]  MRN: [id number]   [name] is enrolled in a chronic special needs plan for Diabetes. Telephone call to client for Health risk assessment review and initial telephone outreach. Contact states client is not available at this time. HIPAA compliant message left with call back phone number.   PLAN;  RNCM will attempt 3rd telephone outreach to client in 2 weeks.   Quinn Plowman RN,BSN,CCM [facility] [phone]

## 2020-02-06 NOTE — Patient Outreach (Signed)
[facility]) Care Management Chronic Special Needs Program  02/06/2020  Name: Stepahnie Campo DOB: 30-Oct-1955  MRN: [id number]  Ms. Brittni Hult is enrolled in a chronic special needs plan for Diabetes. Chronic Care Management Coordinator telephoned client to review health risk assessment and to develop individualized care plan. HIPAA verified.  Introduced the chronic care management program, importance of client participation, and taking their care plan to all provider appointments and inpatient facilities.  Client reports she is doing well. She reports following up with her doctors regularly. Client states she is able to provide her own transportation.  Client reports her Hgb A1c is under goal at 6.5%.  She states she has not been advised by her doctor that she needs to check her blood sugars daily. Client states she checks her blood sugar a few times a week. Client denies any low blood sugar readings below 70.   Goals Addressed              This Visit's Progress   .   Acknowledge receipt of Veterinary surgeon your assigned RN case manager if you need an Advanced directive packet mailed to you.     .  COMPLETED: Client understands the importance of follow-up with providers by attending scheduled visits        Primary care provider visits: 07/01/19, 01/02/20, 01/31/20 Cardiology visit: 07/01/19, 12/28/19    .  Client verbalize knowledge of Heart Failure disease self management skills in 12 months        Review Health team advantage calendar sent in the mail for Heart failure information/ action plan Continue to weigh and record weights daily.  Pay attention to your body if you have any shortness of breath, swelling in your feet, ankles, legs or your waistband gets tight, gain 3 lbs over night or 5 lbs in a week. Report these symptoms to your doctor as soon as possible.  Continue to take your medication as prescribed and follow up with your doctor as  recommended.  RN case manager will send client education article: Heart Failure, Adult and Heart failure action plan sheet.     .  Client will verbalize knowledge of self management of Hypertension as evidences by BP reading of 140/90 or less; or as defined by provider        RN case manager will send client education article: High blood pressure in adults, and Heart healthy diet Take your blood pressure medication as prescribed.  Eat low salt and heart healthy meals full of fruits, vegetables, whole grains, lean protein and limit fat and sugars Increase activity as tolerated Monitor your blood pressure at home as advised by your provider.     Marland Kitchen  HEMOGLOBIN A1C < 7        Congratulations your Hgb A1c is at goal at 6.5% Have your Hgb A1c checked every 6 months if you are at goal or every 3 months if you are not at goal. Review [employer] calendar sent in the mail for Diabetes action plan.  How to treat low blood sugars (Blood sugar less than 70 mg/dl  Please follow the RULE OF 15 for the treatment of hypoglycemia treatment (When your blood sugars are less than 70 mg/ dl) STEP  1:  Take 15 grams of carbohydrates when your blood sugar is low, which includes:   3-4 glucose tabs or  3-4 oz of juice or regular soda or  One  tube of glucose gel STEP 2:  Recheck blood sugar in 15 minutes STEP 3:  If your blood sugar is still low at the 15 minute recheck ---then, go back to STEP 1 and treat again with another 15 grams of carbohydrates    .  Increase physical activity (pt-stated)        RN case manager will send client education article: Exercise and aging and exercise and movement.   Contact your assigned RN case manager if you have questions.        .  Maintain timely refills of diabetic medication as prescribed within the year .        Client reports maintaining timely refills of her medications Contact your RN case manager if you have difficulty obtaining your medications Take  your medications as presribed    .  COMPLETED: Obtain annual  Lipid Profile, LDL-C        Lipid profile completed 12/29/19    .  COMPLETED: Obtain Annual Eye (retinal)  Exam         Annual eye exam completed 10/19/19    .  COMPLETED: Obtain Annual Foot Exam        Annual foot exam completed 07/01/19    .  COMPLETED: Obtain annual screen for micro albuminuria (urine) , nephropathy (kidney problems)        Micro Albuminuria screening completed 06/29/19    .  COMPLETED: Obtain Hemoglobin A1C at least 2 times per year        Hgb A1c completed 12/29/19 and 06/29/19     .  COMPLETED: Visit Primary Care Provider or Endocrinologist at least 2 times per year         Primary care provider visits: 07/01/19, 01/02/20, 01/31/20       Plan:  Send successful outreach letter with a copy of their individualized care plan, Send individual care plan to provider and Send educational material  Chronic care management coordination will outreach in: 12 months.     Quinn Plowman RN,BSN,CCM [facility] Management [phone]

## 2020-02-13 ENCOUNTER — Ambulatory Visit: Payer: Self-pay

## 2020-02-20 NOTE — Telephone Encounter (Signed)
This encounter was created in error - please disregard.

## 2020-03-09 ENCOUNTER — Ambulatory Visit: Payer: BC Managed Care – PPO

## 2020-03-20 ENCOUNTER — Ambulatory Visit (INDEPENDENT_AMBULATORY_CARE_PROVIDER_SITE_OTHER): Payer: HMO

## 2020-03-20 VITALS — Ht 62.5 in | Wt 168.0 lb

## 2020-03-20 DIAGNOSIS — Z0000 Encounter for general adult medical examination without abnormal findings: Secondary | ICD-10-CM | POA: Diagnosis not present

## 2020-03-20 DIAGNOSIS — Z79899 Other long term (current) drug therapy: Secondary | ICD-10-CM

## 2020-03-20 NOTE — Patient Instructions (Addendum)
Julia Pierce , Thank you for taking time to come for your Medicare Wellness Visit. I appreciate your ongoing commitment to your health goals. Please review the following plan we discussed and let me know if I can assist you in the future.   These are the goals we discussed:  Goal: Increase physical activity. Swim for exercise.    This is a list of the screening recommended for you and due dates:  Health Maintenance  Topic Date Due   DEXA scan (bone density measurement)  Never done   Pneumonia vaccines (2 of 2 - PPSV23) 07/25/2019   Mammogram  03/14/2020   Hemoglobin A1C  06/27/2020   Complete foot exam   06/30/2020   Eye exam for diabetics  10/18/2020   Colon Cancer Screening  01/08/2021   Tetanus Vaccine  03/24/2028   Flu Shot  Completed   COVID-19 Vaccine  Completed    Hepatitis C: One time screening is recommended by Center for Disease Control  (CDC) for  adults born from [birth year] through 1966.   Completed   HIV Screening  Completed   Pap Smear  Discontinued    Immunizations Immunization History  Administered Date(s) Administered   Fluad Quad(high Dose 65+) 01/31/2020   Influenza Split 03/28/2014   Influenza,inj,Quad PF,6+ Mos 02/07/2013, 04/11/2015, 03/06/2016, 03/18/2017, 03/24/2018, 02/04/2019   PFIZER SARS-COV-2 Vaccination 07/29/2019, 08/23/2019   Pneumococcal Conjugate-13 06/28/2014   Pneumococcal Polysaccharide-23 05/11/2013   Tdap 02/08/2008, 03/24/2018   Keep all routine maintenance appointments.   Next scheduled lab 04/02/20 @ 9:15  Follow up 04/04/20 @ 9:30  Advanced directives: not yet completed.  Conditions/risks identified: none new.   Follow up in one year for your annual wellness visit.   Preventive Care 63 Years and Older, Female Preventive care refers to lifestyle choices and visits with your health care provider that can promote health and wellness. What does preventive care include?  A yearly physical exam. This is also  called an annual well check.  Dental exams once or twice a year.  Routine eye exams. Ask your health care provider how often you should have your eyes checked.  Personal lifestyle choices, including:  Daily care of your teeth and gums.  Regular physical activity.  Eating a healthy diet.  Avoiding tobacco and drug use.  Limiting alcohol use.  Practicing safe sex.  Taking low-dose aspirin every day.  Taking vitamin and mineral supplements as recommended by your health care provider. What happens during an annual well check? The services and screenings done by your health care provider during your annual well check will depend on your age, overall health, lifestyle risk factors, and family history of disease. Counseling  Your health care provider may ask you questions about your:  Alcohol use.  Tobacco use.  Drug use.  Emotional well-being.  Home and relationship well-being.  Sexual activity.  Eating habits.  History of falls.  Memory and ability to understand (cognition).  Work and work Statistician.  Reproductive health. Screening  You may have the following tests or measurements:  Height, weight, and BMI.  Blood pressure.  Lipid and cholesterol levels. These may be checked every 5 years, or more frequently if you are over 77 years old.  Skin check.  Lung cancer screening. You may have this screening every year starting at age 77 if you have a 30-pack-year history of smoking and currently smoke or have quit within the past 15 years.  Fecal occult blood test (FOBT) of the stool. You may have  this test every year starting at age 87.  Flexible sigmoidoscopy or colonoscopy. You may have a sigmoidoscopy every 5 years or a colonoscopy every 10 years starting at age 34.  Hepatitis C blood test.  Hepatitis B blood test.  Sexually transmitted disease (STD) testing.  Diabetes screening. This is done by checking your blood sugar (glucose) after you have not  eaten for a while (fasting). You may have this done every 1-3 years.  Bone density scan. This is done to screen for osteoporosis. You may have this done starting at age 8.  Mammogram. This may be done every 1-2 years. Talk to your health care provider about how often you should have regular mammograms. Talk with your health care provider about your test results, treatment options, and if necessary, the need for more tests. Vaccines  Your health care provider may recommend certain vaccines, such as:  Influenza vaccine. This is recommended every year.  Tetanus, diphtheria, and acellular pertussis (Tdap, Td) vaccine. You may need a Td booster every 10 years.  Zoster vaccine. You may need this after age 19.  Pneumococcal 13-valent conjugate (PCV13) vaccine. One dose is recommended after age 75.  Pneumococcal polysaccharide (PPSV23) vaccine. One dose is recommended after age 23. Talk to your health care provider about which screenings and vaccines you need and how often you need them. This information is not intended to replace advice given to you by your health care provider. Make sure you discuss any questions you have with your health care provider. Document Released: 05/04/2015 Document Revised: 12/26/2015 Document Reviewed: 02/06/2015 Elsevier Interactive Patient Education  2017 [place] Prevention in the Home Falls can cause injuries. They can happen to people of all ages. There are many things you can do to make your home safe and to help prevent falls. What can I do on the outside of my home?  Regularly fix the edges of walkways and driveways and fix any cracks.  Remove anything that might make you trip as you walk through a door, such as a raised step or threshold.  Trim any bushes or trees on the path to your home.  Use bright outdoor lighting.  Clear any walking paths of anything that might make someone trip, such as rocks or tools.  Regularly check to see if  handrails are loose or broken. Make sure that both sides of any steps have handrails.  Any raised decks and porches should have guardrails on the edges.  Have any leaves, snow, or ice cleared regularly.  Use sand or salt on walking paths during winter.  Clean up any spills in your garage right away. This includes oil or grease spills. What can I do in the bathroom?  Use night lights.  Install grab bars by the toilet and in the tub and shower. Do not use towel bars as grab bars.  Use non-skid mats or decals in the tub or shower.  If you need to sit down in the shower, use a plastic, non-slip stool.  Keep the floor dry. Clean up any water that spills on the floor as soon as it happens.  Remove soap buildup in the tub or shower regularly.  Attach bath mats securely with double-sided non-slip rug tape.  Do not have throw rugs and other things on the floor that can make you trip. What can I do in the bedroom?  Use night lights.  Make sure that you have a light by your bed that is easy to  reach.  Do not use any sheets or blankets that are too big for your bed. They should not hang down onto the floor.  Have a firm chair that has side arms. You can use this for support while you get dressed.  Do not have throw rugs and other things on the floor that can make you trip. What can I do in the kitchen?  Clean up any spills right away.  Avoid walking on wet floors.  Keep items that you use a lot in easy-to-reach places.  If you need to reach something above you, use a strong step stool that has a grab bar.  Keep electrical cords out of the way.  Do not use floor polish or wax that makes floors slippery. If you must use wax, use non-skid floor wax.  Do not have throw rugs and other things on the floor that can make you trip. What can I do with my stairs?  Do not leave any items on the stairs.  Make sure that there are handrails on both sides of the stairs and use them. Fix  handrails that are broken or loose. Make sure that handrails are as long as the stairways.  Check any carpeting to make sure that it is firmly attached to the stairs. Fix any carpet that is loose or worn.  Avoid having throw rugs at the top or bottom of the stairs. If you do have throw rugs, attach them to the floor with carpet tape.  Make sure that you have a light switch at the top of the stairs and the bottom of the stairs. If you do not have them, ask someone to add them for you. What else can I do to help prevent falls?  Wear shoes that:  Do not have high heels.  Have rubber bottoms.  Are comfortable and fit you well.  Are closed at the toe. Do not wear sandals.  If you use a stepladder:  Make sure that it is fully opened. Do not climb a closed stepladder.  Make sure that both sides of the stepladder are locked into place.  Ask someone to hold it for you, if possible.  Clearly mark and make sure that you can see:  Any grab bars or handrails.  First and last steps.  Where the edge of each step is.  Use tools that help you move around (mobility aids) if they are needed. These include:  Canes.  Walkers.  Scooters.  Crutches.  Turn on the lights when you go into a dark area. Replace any light bulbs as soon as they burn out.  Set up your furniture so you have a clear path. Avoid moving your furniture around.  If any of your floors are uneven, fix them.  If there are any pets around you, be aware of where they are.  Review your medicines with your doctor. Some medicines can make you feel dizzy. This can increase your chance of falling. Ask your doctor what other things that you can do to help prevent falls. This information is not intended to replace advice given to you by your health care provider. Make sure you discuss any questions you have with your health care provider. Document Released: 02/01/2009 Document Revised: 09/13/2015 Document Reviewed:  05/12/2014 Elsevier Interactive Patient Education  2017 [employer].

## 2020-03-20 NOTE — Progress Notes (Addendum)
Subjective:   Julia Pierce is a 64 y.o. female who presents for Medicare Annual (Subsequent) preventive examination.  Review of Systems    No ROS.  Medicare Wellness Virtual Visit.    Cardiac Risk Factors include: advanced age (>21mn, >>14women);hypertension;diabetes mellitus     Objective:    Today's Vitals   03/20/20 1308  Weight: 168 lb (76.2 kg)  Height: 5' 2.5" (1.588 m)   Body mass index is 30.24 kg/m.  Advanced Directives 03/20/2020 02/06/2020 03/09/2019 12/16/2017 12/15/2016 12/31/2015 11/27/2015  Does Patient Have a Medical Advance Directive? _0  No No  Would patient like information on creating a medical advance directive? No - Patient declined - No - Patient declined No - Patient declined No - Patient declined - -    Current Medications (verified) Outpatient Encounter Medications as of 03/20/2020  Medication Sig   Accu-Chek FastClix Lancets MISC TEST ONCE DAILY E11.9   ACCU-CHEK GUIDE test strip TEST ONCE DAILY E11.9   amLODipine (NORVASC) 2.5 MG tablet Take 1 tablet (2.5 mg total) by mouth daily.   Azelaic Acid 15 % cream APPLY TO AFFECTED AREA EVERY DAY   B Complex Vitamins (B COMPLEX PO) Take 1 tablet by mouth daily.    blood glucose meter kit and supplies Dispense based on patient and insurance preference. Use to check blood sugar once daily. (FOR ICD-10 E11.9).   buPROPion (WELLBUTRIN XL) 150 MG 24 hr tablet TAKE 1 TABLET BY MOUTH EVERY DAY   calcium elemental as carbonate (BARIATRIC TUMS ULTRA) 400 MG chewable tablet Chew by mouth.   Cholecalciferol (VITAMIN D3) 2000 UNITS TABS Take 1 tablet by mouth daily.   esomeprazole (NEXIUM) 20 MG capsule Take 10 mg by mouth daily at 12 noon.    fluticasone (FLONASE) 50 MCG/ACT nasal spray Place 2 sprays into both nostrils daily.   JANUVIA 50 MG tablet TAKE 1 TABLET BY MOUTH EVERY DAY   losartan (COZAAR) 100 MG tablet Take 100 mg by mouth daily.   methocarbamol (ROBAXIN) 500 MG tablet TAKE 1 TABLET BY MOUTH  EVERY DAY OR 2 TO 3 TIMES DAILY AS NEEDED   metoprolol tartrate (LOPRESSOR) 25 MG tablet Take 1 tablet (25 mg total) by mouth 2 (two) times daily. NOTE DOSE INCREASE.  KEEP ON FILE FOR FUTURE REFILLS   Multiple Vitamin (MULTIVITAMIN) tablet Take 1 tablet by mouth daily.     sertraline (ZOLOFT) 100 MG tablet TAKE 1 TABLET BY MOUTH EVERY DAY   tolterodine (DETROL) 2 MG tablet TAKE 1 TABLET TWICE A DAY   Facility-Administered Encounter Medications as of 03/20/2020  Medication   diclofenac sodium (VOLTAREN) 1 % transdermal gel 4 g   midazolam (VERSED) 5 MG/5ML injection 5 mg   orphenadrine (NORFLEX) injection 60 mg   sodium chloride 0.9 % injection 20 mL   triamcinolone acetonide (KENALOG-40) injection 40 mg    Allergies (verified) Cephalexin, Penicillin g, Penicillins, Atorvastatin, Cymbalta [duloxetine hcl], Duloxetine, Fenofibrate, Statins, and Latex   History: Past Medical History:  Diagnosis Date   Arthritis    Cardiac arrhythmia due to congenital heart disease    Chicken pox    Chronic systolic heart failure ([facility] [place] 09/13/2013   EF now 55%  Bu June 2019   ECHO   Depression    Headache, frequent episodic tension-type    High cholesterol    History of high blood pressure    readings   Holter monitor, abnormal August 2012   done for long QT.  some  contractile asynchrony [name])   Hx of colonoscopy sept 2012   normal,  next due 2022, [name]   Hypertension    Migraines    Morton's neuroma    bilateral   Syncope    Past Surgical History:  Procedure Laterality Date   ABDOMINAL HYSTERECTOMY  Dec 2013   [place]   APPENDECTOMY  1973   BI-VENTRICULAR PACEMAKER INSERTION (CRT-P)  05/2013   MDT CRTP implanted by Dr Lovena Le for cardiomyopathy, LBBB, and syncope   HYSTEROSCOPY  2005   heavy bleeding   IMPLANTABLE CARDIOVERTER DEFIBRILLATOR IMPLANT N/A 06/02/2013   Procedure: IMPLANTABLE CARDIOVERTER DEFIBRILLATOR IMPLANT;  Surgeon: Evans Lance, MD;  Location: [facility] CATH LAB;   Service: Cardiovascular;  Laterality: N/A;   SPINAL FUSION  03/2007   ruptured disc  L5  [name]   TONSILLECTOMY AND ADENOIDECTOMY  1966   Family History  Problem Relation Age of Onset   Arthritis Mother    Hyperlipidemia Mother    Cirrhosis Mother    Arthritis Father    Cancer Father        prostate cancer    Hyperlipidemia Father    Pulmonary embolism Father 67   Alzheimer's disease Father    Cancer Other        ovarian,uterus   Heart disease Other    Stroke Other    Learning disabilities Other    Coronary artery disease Paternal Grandfather 1   Diabetes Brother    Diabetes Maternal Uncle    Social History   Socioeconomic History   Marital status: Married    Spouse name: Not on file   Number of children: Not on file   Years of education: Not on file   Highest education level: Not on file  Occupational History   Not on file  Tobacco Use   Smoking status: Never Smoker   Smokeless tobacco: Never Used  Vaping Use   Vaping Use: Never used  Substance and Sexual Activity   Alcohol use: No   Drug use: No   Sexual activity: Yes  Other Topics Concern   Not on file  Social History Narrative   Not on file   Social Determinants of Health   Financial Resource Strain: Low Risk    Difficulty of Paying Living Expenses: Not hard at all  Food Insecurity: No Food Insecurity   Worried About Charity fundraiser in the Last Year: Never true   [place] in the Last Year: Never true  Transportation Needs: No Transportation Needs   Lack of Transportation (Medical): No   Lack of Transportation (Non-Medical): No  Physical Activity:    Days of Exercise per Week: Not on file   Minutes of Exercise per Session: Not on file  Stress:    Feeling of Stress : Not on file  Social Connections: Unknown   Frequency of Communication with Friends and Family: Not on file   Frequency of Social Gatherings with Friends and Family: Not on file   Attends Religious Services: Not on file     Active Member of Clubs or Organizations: Not on file   Attends Archivist Meetings: Not on file   Marital Status: Married    Tobacco Counseling Counseling given: Not Answered   Clinical Intake:  Pre-visit preparation completed: Yes        Diabetes: Yes (Followed by PCP)  How often do you need to have someone help you when you read instructions, pamphlets, or other written materials  from your doctor or pharmacy?: 1 - Never  Nutrition Risk Assessment: Has the patient had any N/V/D within the last 2 months?  No  Does the patient have any non-healing wounds?  No  Has the patient had any unintentional weight loss or weight gain?  No   Diabetes: If diabetic, was a CBG obtained today?  Yes, FBS 147 Did the patient bring in their glucometer from home?  No .  How often do you monitor your CBG's? 2-3x weekly.   Financial Strains and Diabetes Management: Are you having any financial strains with the device, your supplies or your medication? No .  Does the patient want to be seen by Chronic Care Management for management of their medications?  Yes Would the patient like to be referred to a Nutritionist or for Diabetic Management?  No   Diabetic Exams: Diabetic Eye Exam: Completed 10/19/19 Diabetic Foot Exam: Completed 07/01/19  Interpreter Needed?: No      Activities of Daily Living In your present state of health, do you have any difficulty performing the following activities: 03/20/2020 02/06/2020  Hearing? N Y  Vision? N N  Difficulty concentrating or making decisions? N N  Walking or climbing stairs? N N  Dressing or bathing? N N  Doing errands, shopping? N N  Preparing Food and eating ? N N  Using the Toilet? N N  In the past six months, have you accidently leaked urine? N Y  Do you have problems with loss of bowel control? N N  Managing your Medications? N N  Managing your Finances? N N  Housekeeping or managing your Housekeeping? N N  Some recent data  might be hidden    Patient Care Team: Crecencio Mc, MD as PCP - General (Internal Medicine) Dannielle Karvonen, RN as [place] [facility] [place] any recent Medical Services you may have received from other than [facility] providers in the past year (date may be approximate).     Assessment:   This is a routine wellness examination for Julia Pierce.  I connected with Julia Pierce today by telephone and verified that I am speaking with the correct person using two identifiers. Location patient: home Location provider: work Persons participating in the virtual visit: patient, Marine scientist.    I discussed the limitations, risks, security and privacy concerns of performing an evaluation and management service by telephone and the availability of in person appointments. The patient expressed understanding and verbally consented to this telephonic visit.    Interactive audio and video telecommunications were attempted between this provider and patient, however failed, due to patient having technical difficulties OR patient did not have access to video capability.  We continued and completed visit with audio only.  Some vital signs may be absent or patient reported.   Hearing/Vision screen  Hearing Screening   _0  _1  _2  _3  _4  _5  _6  _7  _8   Right ear:           Left ear:           Comments:  Patient has difficulty hearing conversational tones in a crowd. Audiology testing deferred in the last year per patient request  Vision Screening Comments: Followed by [facility] Wears corrective lenses    Dietary issues and exercise activities discussed: Current Exercise Habits: The patient does not participate in regular exercise at present  Regular diet Good water intake  Goal: Increase physical activity. Swim for exercise.   Depression Screen PHQ 2/9 Scores 03/20/2020 02/06/2020 01/02/2020  03/09/2019 10/28/2018 12/16/2017 12/15/2016  PHQ - 2 Score 0 1 2 0 0  0 0  PHQ- 9 Score - - 7 - 2 - 0  Exception Documentation - - - - - - -    Fall Risk Fall Risk  01/31/2020 01/02/2020 07/01/2019 03/31/2019 03/09/2019  Falls in the past year? 0 0 0 0 0  Comment - - - - -  Number falls in past yr: - - - - 0  Injury with Fall? - - - - -  Risk for fall due to : - - - - -  Risk for fall due to: Comment - - - - -  Follow up Falls evaluation completed Falls evaluation completed Falls evaluation completed Falls evaluation completed Falls prevention discussed;Education provided   Handrails in use when climbing stairs? Yes Home free of loose throw rugs in walkways, pet beds, electrical cords, etc? Yes  Adequate lighting in your home to reduce risk of falls? Yes   ASSISTIVE DEVICES UTILIZED TO PREVENT FALLS: Use of a cane, walker or w/c? No  Grab bars in the bathroom? No  Shower chair or bench in shower? No  Elevated toilet seat or a handicapped toilet? No   TIMED UP AND GO:  Was the test performed? No . Virtual visit.   Cognitive Function: Patient is alert and oriented x3.  Denies difficulty focusing, making decisions, memory loss.  Enjoys reading and sewing.  MMSE/6CIT deferred. Normal by direct communication/observation.  MMSE - Mini Mental State Exam 12/16/2017 12/15/2016  Orientation to time 5 5  Orientation to Place 5 5  Registration 3 3  Attention/ Calculation 5 5  Recall 3 3  Language- name 2 objects 2 2  Language- repeat 1 1  Language- follow 3 step command 3 3  Language- read & follow direction 1 1  Write a sentence 1 1  Copy design 1 1  Total score 30 30     6CIT Screen 03/09/2019  What Year? 0 points  What month? 0 points  What time? 0 points  Count back from 20 0 points  Months in reverse 0 points  Repeat phrase 0 points  Total Score 0    Immunizations Immunization History  Administered Date(s) Administered   Fluad Quad(high Dose 65+) 01/31/2020   Influenza Split 03/28/2014   Influenza,inj,Quad PF,6+ Mos 02/07/2013,  04/11/2015, 03/06/2016, 03/18/2017, 03/24/2018, 02/04/2019   PFIZER SARS-COV-2 Vaccination 07/29/2019, 08/23/2019   Pneumococcal Conjugate-13 06/28/2014   Pneumococcal Polysaccharide-23 05/11/2013   Tdap 02/08/2008, 03/24/2018   Health Maintenance Health Maintenance  Topic Date Due   DEXA SCAN  Never done   PNA vac Low Risk Adult (2 of 2 - PPSV23) 07/25/2019   MAMMOGRAM  03/14/2020   HEMOGLOBIN A1C  06/27/2020   FOOT EXAM  06/30/2020   OPHTHALMOLOGY EXAM  10/18/2020   COLONOSCOPY  01/08/2021   TETANUS/TDAP  03/24/2028   INFLUENZA VACCINE  Completed   COVID-19 Vaccine  Completed   Hepatitis C Screening  Completed   HIV Screening  Completed   PAP SMEAR-Modifier  Discontinued   Colorectal cancer screening: Completed 01/09/11. Repeat every 10 years   Mammogram status: Completed 03/15/19. Repeat every year. Plans to schedule later in [place].   Bone Density- plans to schedule later in the season. Deferred.   Lung Cancer Screening: (Low Dose CT Chest recommended if Age 27-80 years, 30 pack-year currently smoking OR have quit w/in 15years.) does not qualify.   Hepatitis C Screening: Completed 06/09/16.  Vision Screening: Recommended annual  ophthalmology exams for early detection of glaucoma and other disorders of the eye. Is the patient up to date with their annual eye exam?  Yes  Who is the provider or what is the name of the office in which the patient attends annual eye exams? [facility].  Dental Screening: Recommended annual dental exams for proper oral hygiene. Visits every 6 months.   Community Resource Referral / Chronic Care Management: CRR required this visit?  No   CCM required this visit?  Yes. Referral sent for medication management.      Plan:   Keep all routine maintenance appointments.   Next scheduled lab 04/02/20 @ 9:15  Follow up 04/04/20 @ 9:30  I have personally reviewed and noted the following in the patient's chart:   Medical and social  history Use of alcohol, tobacco or illicit drugs  Current medications and supplements Functional ability and status Nutritional status Physical activity Advanced directives List of other physicians Hospitalizations, surgeries, and ER visits in previous 12 months Vitals Screenings to include cognitive, depression, and falls Referrals and appointments  In addition, I have reviewed and discussed with patient certain preventive protocols, quality metrics, and best practice recommendations. A written personalized care plan for preventive services as well as general preventive health recommendations were provided to patient via mychart.     Varney Biles, LPN   02/21/1116   Nurse Notes: Patient request assistance with medication management. Referral sent to Chronic Care Management.      I have reviewed the above information and agree with above.   Deborra Medina, MD

## 2020-03-21 ENCOUNTER — Other Ambulatory Visit: Payer: Self-pay | Admitting: Internal Medicine

## 2020-03-21 ENCOUNTER — Ambulatory Visit (INDEPENDENT_AMBULATORY_CARE_PROVIDER_SITE_OTHER): Payer: HMO

## 2020-03-21 ENCOUNTER — Telehealth: Payer: Self-pay | Admitting: *Deleted

## 2020-03-21 DIAGNOSIS — I429 Cardiomyopathy, unspecified: Secondary | ICD-10-CM | POA: Diagnosis not present

## 2020-03-21 NOTE — Addendum Note (Signed)
Addended by: Dia Crawford on: 03/21/2020 02:40 PM   Modules accepted: Orders

## 2020-03-21 NOTE — Chronic Care Management (AMB) (Signed)
Chronic Care Management   Note  03/21/2020 Name: Julia Pierce MRN: [id number] DOB: [birth year]  Julia Pierce is a 64 y.o. year old female who is a primary care patient of Derrel Nip, Aris Everts, MD. I reached out to Milus Mallick by phone today in response to a referral sent by Julia Pierce's PCP, Crecencio Mc, MD.     Julia Pierce was given information about Chronic Care Management services today including:  1. CCM service includes personalized support from designated clinical staff supervised by her physician, including individualized plan of care and coordination with other care providers 2. 24/7 contact phone numbers for assistance for urgent and routine care needs. 3. Service will only be billed when office clinical staff spend 20 minutes or more in a month to coordinate care. 4. Only one practitioner may furnish and bill the service in a calendar month. 5. The patient may stop CCM services at any time (effective at the end of the month) by phone call to the office staff. 6. The patient will be responsible for cost sharing (co-pay) of up to 20% of the service fee (after annual deductible is met).  Patient agreed to services and verbal consent obtained.   Follow up plan: Telephone appointment with care management team member scheduled for:04/11/2020  [place] Management  Direct Dial: [phone]

## 2020-03-22 LAB — CUP PACEART REMOTE DEVICE CHECK
Battery Remaining Longevity: 23 mo
Battery Voltage: 2.92 V
Brady Statistic AP VP Percent: 0.14 %
Brady Statistic AP VS Percent: 0 %
Brady Statistic AS VP Percent: 99.85 %
Brady Statistic AS VS Percent: 0.01 %
Brady Statistic RA Percent Paced: 0.14 %
Brady Statistic RV Percent Paced: 99.99 %
Date Time Interrogation Session: 20211201154213
Implantable Lead Implant Date: 20150212
Implantable Lead Implant Date: 20150212
Implantable Lead Implant Date: 20150212
Implantable Lead Location: 753858
Implantable Lead Location: 753859
Implantable Lead Location: 753860
Implantable Lead Model: 4194
Implantable Lead Model: 5076
Implantable Lead Model: 5076
Implantable Pulse Generator Implant Date: 20150212
Lead Channel Impedance Value: 342 Ohm
Lead Channel Impedance Value: 361 Ohm
Lead Channel Impedance Value: 361 Ohm
Lead Channel Impedance Value: 418 Ohm
Lead Channel Impedance Value: 437 Ohm
Lead Channel Impedance Value: 456 Ohm
Lead Channel Impedance Value: 494 Ohm
Lead Channel Impedance Value: 532 Ohm
Lead Channel Impedance Value: 551 Ohm
Lead Channel Pacing Threshold Amplitude: 0.625 V
Lead Channel Pacing Threshold Amplitude: 1 V
Lead Channel Pacing Threshold Amplitude: 1.875 V
Lead Channel Pacing Threshold Pulse Width: 0.4 ms
Lead Channel Pacing Threshold Pulse Width: 0.4 ms
Lead Channel Pacing Threshold Pulse Width: 0.4 ms
Lead Channel Sensing Intrinsic Amplitude: 1.75 mV
Lead Channel Sensing Intrinsic Amplitude: 1.75 mV
Lead Channel Sensing Intrinsic Amplitude: 14.75 mV
Lead Channel Sensing Intrinsic Amplitude: 18.375 mV
Lead Channel Setting Pacing Amplitude: 1.5 V
Lead Channel Setting Pacing Amplitude: 2 V
Lead Channel Setting Pacing Amplitude: 2.5 V
Lead Channel Setting Pacing Pulse Width: 0.4 ms
Lead Channel Setting Pacing Pulse Width: 0.8 ms
Lead Channel Setting Sensing Sensitivity: 0.9 mV

## 2020-03-26 ENCOUNTER — Other Ambulatory Visit: Payer: Self-pay

## 2020-03-26 NOTE — Patient Outreach (Signed)
  [facility]) Care Management Chronic Special Needs Program    03/26/2020  Name: Julia Pierce, DOB: 1955/09/17  MRN: [id number]   [name] is enrolled in a chronic special needs plan for Diabetes.[facility] Management will continue to provide services for this member through 04/20/20.  The [employer] care management team will assume care 04/21/2020.   Quinn Plowman RN,BSN,CCM [facility] Care Management [phone]

## 2020-03-27 NOTE — Progress Notes (Signed)
Remote pacemaker transmission.   

## 2020-04-02 ENCOUNTER — Other Ambulatory Visit: Payer: Self-pay

## 2020-04-02 ENCOUNTER — Other Ambulatory Visit (INDEPENDENT_AMBULATORY_CARE_PROVIDER_SITE_OTHER): Payer: HMO

## 2020-04-02 DIAGNOSIS — E119 Type 2 diabetes mellitus without complications: Secondary | ICD-10-CM | POA: Diagnosis not present

## 2020-04-02 DIAGNOSIS — I10 Essential (primary) hypertension: Secondary | ICD-10-CM

## 2020-04-02 LAB — MICROALBUMIN / CREATININE URINE RATIO
Creatinine,U: 87.3 mg/dL
Microalb Creat Ratio: 1.7 mg/g (ref 0.0–30.0)
Microalb, Ur: 1.5 mg/dL (ref 0.0–1.9)

## 2020-04-02 LAB — COMPREHENSIVE METABOLIC PANEL
ALT: 12 U/L (ref 0–35)
AST: 15 U/L (ref 0–37)
Albumin: 4.2 g/dL (ref 3.5–5.2)
Alkaline Phosphatase: 110 U/L (ref 39–117)
BUN: 17 mg/dL (ref 6–23)
CO2: 30 mEq/L (ref 19–32)
Calcium: 9 mg/dL (ref 8.4–10.5)
Chloride: 104 mEq/L (ref 96–112)
Creatinine, Ser: 0.8 mg/dL (ref 0.40–1.20)
GFR: 77.17 mL/min (ref >60.00)
Glucose, Bld: 124 mg/dL — ABNORMAL HIGH (ref 70–99)
Potassium: 3.8 mEq/L (ref 3.5–5.1)
Sodium: 142 mEq/L (ref 135–145)
Total Bilirubin: 0.4 mg/dL (ref 0.2–1.2)
Total Protein: 6.7 g/dL (ref 6.0–8.3)

## 2020-04-02 LAB — LIPID PANEL
Cholesterol: 236 mg/dL — ABNORMAL HIGH (ref 0–200)
HDL: 40.3 mg/dL (ref >39.00)
NonHDL: 195.87
Total CHOL/HDL Ratio: 6
Triglycerides: 338 mg/dL — ABNORMAL HIGH (ref 0.0–149.0)
VLDL: 67.6 mg/dL — ABNORMAL HIGH (ref 0.0–40.0)

## 2020-04-02 LAB — LDL CHOLESTEROL, DIRECT: Direct LDL: 141 mg/dL

## 2020-04-02 LAB — HEMOGLOBIN A1C: Hgb A1c MFr Bld: 6.3 % (ref 4.6–6.5)

## 2020-04-04 ENCOUNTER — Other Ambulatory Visit: Payer: Self-pay

## 2020-04-04 ENCOUNTER — Encounter: Payer: Self-pay | Admitting: Internal Medicine

## 2020-04-04 ENCOUNTER — Ambulatory Visit (INDEPENDENT_AMBULATORY_CARE_PROVIDER_SITE_OTHER): Payer: HMO | Admitting: Internal Medicine

## 2020-04-04 VITALS — BP 148/82 | HR 78 | Temp 98.1°F | Ht 62.52 in | Wt 165.4 lb

## 2020-04-04 DIAGNOSIS — Z23 Encounter for immunization: Secondary | ICD-10-CM

## 2020-04-04 DIAGNOSIS — I10 Essential (primary) hypertension: Secondary | ICD-10-CM

## 2020-04-04 DIAGNOSIS — T466X5A Adverse effect of antihyperlipidemic and antiarteriosclerotic drugs, initial encounter: Secondary | ICD-10-CM | POA: Diagnosis not present

## 2020-04-04 DIAGNOSIS — E782 Mixed hyperlipidemia: Secondary | ICD-10-CM | POA: Diagnosis not present

## 2020-04-04 DIAGNOSIS — Z1231 Encounter for screening mammogram for malignant neoplasm of breast: Secondary | ICD-10-CM | POA: Diagnosis not present

## 2020-04-04 DIAGNOSIS — F320 Major depressive disorder, single episode, mild: Secondary | ICD-10-CM

## 2020-04-04 DIAGNOSIS — E1169 Type 2 diabetes mellitus with other specified complication: Secondary | ICD-10-CM | POA: Diagnosis not present

## 2020-04-04 DIAGNOSIS — E781 Pure hyperglyceridemia: Secondary | ICD-10-CM | POA: Diagnosis not present

## 2020-04-04 DIAGNOSIS — M791 Myalgia, unspecified site: Secondary | ICD-10-CM

## 2020-04-04 MED ORDER — TRIAMCINOLONE ACETONIDE 0.1 % EX CREA: 1.0000 "application " | TOPICAL_CREAM | Freq: Two times a day (BID) | CUTANEOUS | 0 refills | Status: DC

## 2020-04-04 NOTE — Progress Notes (Addendum)
Subjective:  Patient ID: Julia Pierce, female    DOB: 1956/03/21  Age: 64 y.o. MRN: [id number]  CC: The primary encounter diagnosis was Breast cancer screening by mammogram. Diagnoses of Need for 23-polyvalent pneumococcal polysaccharide vaccine, Primary hypertension, Depression, major, single episode, mild ([place]), Mixed hypertriglyceridemia, Type 2 diabetes mellitus with hypertriglyceridemia ([place]), and Myalgia due to statin were also pertinent to this visit.  HPI Julia Pierce presents for follow up on Type 2 DM, depression,  nonischemic cardiomyopathy s/p pacemaker,  And hyperlipidemia.      This visit occurred during the SARS-CoV-2 public health emergency.  Safety protocols were in place, including screening questions prior to the visit, additional usage of staff PPE, and extensive cleaning of exam room while observing appropriate contact time as indicated for disinfecting solutions.    T2DM:  She  feels generally well,  And has not been exercising regularly for the last several months but following a careful diet.  Checking  blood sugars  daily at variable times,  BS have been under 130 fasting and < 150 post prandially.  Denies any recent hypoglyemic events.  Taking   medications as directed. Following a carbohydrate modified diet 6 days per week. Denies numbness, burning and tingling of extremities. Appetite is good.   Hyperlipidemia:  Primarily triglyceridemia. Recent labs reviewed.  She has a history of documented myalgia to statins and fenofibrates.  Reviewed imaging studies; there is  No atherosclerosis on any imaging studies   Prior cardiac cath was clean Julia Pierce , done pre Epic ,  2007, no report in chart.). Despite discussion today she prefers to defer a trial of once weekly statin   Cardiomyopathy s/p Pacemaker:  Last download (done every 3 months) was done  on Dec 1 noted a 15 bt SYMPTOMATIC run of NSVT.  The runs was not accompanied by dizziness,  Dyspnea or chest pain. Dr Lovena Le  aware.   Depression with anxiety:  She has been feeling better on the wellbutrin.  Has also changed her diet and feels more energetic  since giving up corn ,  Corn syrup,  Etc.  Not snacking except on fruit   Depression:  Aggravated by son's divorce,  Other family matters,  Has noticed an improvement since starting wellbutrin which was added to zoloft at last visit. ,  Feels a little shaky for 15 minutes after taking the medication.  Lasts about 30 mintues after taking it.  Husband's stroke and processing issues have also been a trigger for her symptoms.  Husband has some paranoid ideas about government conspiracies and is anti vaccine.      Outpatient Medications Prior to Visit  Medication Sig Dispense Refill  . Accu-Chek FastClix Lancets MISC TEST ONCE DAILY E11.9 102 each 1  . ACCU-CHEK GUIDE test strip TEST ONCE DAILY E11.9 100 strip 1  . amLODipine (NORVASC) 2.5 MG tablet Take 1 tablet (2.5 mg total) by mouth daily. 90 tablet 3  . Azelaic Acid 15 % cream APPLY TO AFFECTED AREA EVERY DAY    . B Complex Vitamins (B COMPLEX PO) Take 1 tablet by mouth daily.     . blood glucose meter kit and supplies Dispense based on patient and insurance preference. Use to check blood sugar once daily. (FOR ICD-10 E11.9). 1 each 0  . buPROPion (WELLBUTRIN XL) 150 MG 24 hr tablet TAKE 1 TABLET BY MOUTH EVERY DAY 90 tablet 1  . calcium elemental as carbonate (BARIATRIC TUMS ULTRA) 400 MG chewable tablet Chew by mouth.    Marland Kitchen  Cholecalciferol (VITAMIN D3) 2000 UNITS TABS Take 1 tablet by mouth daily.    Marland Kitchen esomeprazole (NEXIUM) 20 MG capsule Take 10 mg by mouth daily.    . fluticasone (FLONASE) 50 MCG/ACT nasal spray Place 2 sprays into both nostrils daily.    Marland Kitchen JANUVIA 50 MG tablet TAKE 1 TABLET BY MOUTH EVERY DAY 90 tablet 1  . losartan (COZAAR) 100 MG tablet Take 100 mg by mouth daily.    . methocarbamol (ROBAXIN) 500 MG tablet TAKE 1 TABLET BY MOUTH EVERY DAY OR 2 TO 3 TIMES DAILY AS NEEDED 90 tablet 5  .  metoprolol tartrate (LOPRESSOR) 25 MG tablet Take 1 tablet (25 mg total) by mouth 2 (two) times daily. NOTE DOSE INCREASE.  KEEP ON FILE FOR FUTURE REFILLS 180 tablet 1  . Multiple Vitamin (MULTIVITAMIN) tablet Take 1 tablet by mouth daily.    . sertraline (ZOLOFT) 100 MG tablet TAKE 1 TABLET BY MOUTH EVERY DAY 90 tablet 1  . tolterodine (DETROL) 2 MG tablet TAKE 1 TABLET TWICE A DAY 180 tablet 3   Facility-Administered Medications Prior to Visit  Medication Dose Route Frequency Provider Last Rate Last Admin  . diclofenac sodium (VOLTAREN) 1 % transdermal gel 4 g  4 g Topical QID Mohammed Kindle, MD      . midazolam (VERSED) 5 MG/5ML injection 5 mg  5 mg Intravenous Once Mohammed Kindle, MD      . orphenadrine (NORFLEX) injection 60 mg  60 mg Intramuscular Once Mohammed Kindle, MD      . sodium chloride 0.9 % injection 20 mL  20 mL Other Once Mohammed Kindle, MD      . triamcinolone acetonide (KENALOG-40) injection 40 mg  40 mg Other Once Mohammed Kindle, MD        Review of Systems;  Patient denies headache, fevers, malaise, unintentional weight loss, skin rash, eye pain, sinus congestion and sinus pain, sore throat, dysphagia,  hemoptysis , cough, dyspnea, wheezing, chest pain, palpitations, orthopnea, edema, abdominal pain, nausea, melena, diarrhea, constipation, flank pain, dysuria, hematuria, urinary  Frequency, nocturia, numbness, tingling, seizures,  Focal weakness, Loss of consciousness,  Tremor, insomnia, depression, anxiety, and suicidal ideation.      Objective:  BP (!) 148/82 (BP Location: Left Arm, Patient Position: Sitting)   Pulse 78   Temp 98.1 F (36.7 C)   Ht 5' 2.52" (1.588 m)   Wt 165 lb 6.4 oz (75 kg)   SpO2 97%   BMI 29.75 kg/m   BP Readings from Last 3 Encounters:  04/04/20 (!) 148/82  01/31/20 (!) 150/86  01/02/20 (!) 168/84    Wt Readings from Last 3 Encounters:  04/04/20 165 lb 6.4 oz (75 kg)  03/20/20 168 lb (76.2 kg)  01/31/20 168 lb 9.6 oz (76.5 kg)     General appearance: alert, cooperative and appears stated age Ears: normal TM's and external ear canals both ears Throat: lips, mucosa, and tongue normal; teeth and gums normal Neck: no adenopathy, no carotid bruit, supple, symmetrical, trachea midline and thyroid not enlarged, symmetric, no tenderness/mass/nodules Back: symmetric, no curvature. ROM normal. No CVA tenderness. Lungs: clear to auscultation bilaterally Heart: regular rate and rhythm, S1, S2 normal, no murmur, click, rub or gallop Abdomen: soft, non-tender; bowel sounds normal; no masses,  no organomegaly Pulses: 2+ and symmetric Skin: Skin color, texture, turgor normal. No rashes or lesions Lymph nodes: Cervical, supraclavicular, and axillary nodes normal.  Lab Results  Component Value Date   HGBA1C 6.3 04/02/2020  HGBA1C 6.5 12/29/2019   HGBA1C 6.4 06/29/2019    Lab Results  Component Value Date   CREATININE 0.80 04/02/2020   CREATININE 0.76 12/29/2019   CREATININE 0.65 06/29/2019    Lab Results  Component Value Date   WBC 8.0 09/11/2016   HGB 13.6 09/11/2016   HCT 40.8 09/11/2016   PLT 257.0 09/11/2016   GLUCOSE 124 (H) 04/02/2020   CHOL 236 (H) 04/02/2020   TRIG 338.0 (H) 04/02/2020   HDL 40.30 04/02/2020   LDLDIRECT 141.0 04/02/2020   LDLCALC 88 08/17/2013   ALT 12 04/02/2020   AST 15 04/02/2020   NA 142 04/02/2020   K 3.8 04/02/2020   CL 104 04/02/2020   CREATININE 0.80 04/02/2020   BUN 17 04/02/2020   CO2 30 04/02/2020   TSH 3.80 12/12/2016   HGBA1C 6.3 04/02/2020   MICROALBUR 1.5 04/02/2020    CT CERVICAL SPINE WO CONTRAST  Result Date: 12/31/2016 CLINICAL DATA:  Bilateral hand numbness and weakness, worse on the left. Cervical radiculopathy. EXAM: CT CERVICAL SPINE WITHOUT CONTRAST TECHNIQUE: Multidetector CT imaging of the cervical spine was performed without intravenous contrast. Multiplanar CT image reconstructions were also generated. COMPARISON:  MRI of cervical spine 10/18/2012  FINDINGS: Alignment: This AP alignment is anatomic. There straightening of the normal cervical lordosis, similar the prior study. Skull base and vertebrae: The craniocervical junction is normal. Vertebral body heights are maintained. Endplate Schmorl's nodes are stable. Soft tissues and spinal canal: The soft tissues the neck demonstrate atherosclerotic calcifications at the aortic arch. 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 No significant adenopathy is present. The visualized salivary glands are within normal limits. Disc levels:  C2-3:  Negative. C3-4:  Negative. C4-5: Asymmetric left-sided uncovertebral spurring is noted. Mild left foraminal stenosis is stable. C5-6: A broad-based disc osteophyte complex present. Uncovertebral spurring is worse on the left. Moderate central and severe left foraminal stenosis is stable. C6-7: A broad-based disc osteophyte complex is present. There is progressive effacement of the ventral CSF. Moderate left and mild right foraminal stenosis is stable. C7-T1:  Negative. Upper chest: The lung apices are clear. IMPRESSION: 1. Mild left foraminal narrowing at C3-4 is stable. 2. Moderate central and severe left foraminal stenosis at C5-6 is similar the prior exam. 3. Moderate left and mild right foraminal stenosis at C6-7 is similar the prior study. 4. Progressive central in left central canal stenosis at C6-7. Electronically Signed   By: San Morelle M.D.   On: 12/31/2016 15:04    Assessment & Plan:   Problem List Items Addressed This Visit      Unprioritized   Hypertension    Well controlled on current regimen of amlodipine 2.5 mg ,  Metoprolol 25 mg bid,  And losartan 100 mg daily . Renal function stable, no changes today.  Lab Results  Component Value Date   CREATININE 0.80 04/02/2020   Lab Results  Component Value Date   NA 142 04/02/2020   K 3.8 04/02/2020   CL 104 04/02/2020   CO2 30 04/02/2020         Type 2 diabetes mellitus with hypertriglyceridemia ([place])     Improved control with altered eating habits.  No changes today.  Continue Januvia and losartan.  Lab Results  Component Value Date   HGBA1C 6.3 04/02/2020   Lab Results  Component Value Date   MICROALBUR 1.5 04/02/2020   MICROALBUR 1.5 06/29/2019           Myalgia due to statin    She  has documented intolerance to statins and fenofibrates due to recurrent severe myalgias.  Despite her diagnosis of T2DM and nonischemic CM she has no evidence of atherosclerosis on any imaging (nor on prior cardiac catheterization),  And declines to try once weekly statin       Mixed hypertriglyceridemia    Untreated due to fenofibrate intolerance but again > 300.  She has deferred a repeat trial of statin and fenofibrate , even once weekly  Lab Results  Component Value Date   CHOL 236 (H) 04/02/2020   HDL 40.30 04/02/2020   LDLCALC 88 08/17/2013   LDLDIRECT 141.0 04/02/2020   TRIG 338.0 (H) 04/02/2020   CHOLHDL 6 04/02/2020         Depression, major, single episode, mild ([facility])    Trial of wellbutrin to address concentration and fatigue has improved her symptoms.  Continue medication along with  sertraline for the anxiety        Other Visit Diagnoses    Breast cancer screening by mammogram    -  Primary   Relevant Orders   MM 3D SCREEN BREAST BILATERAL   Need for 23-polyvalent pneumococcal polysaccharide vaccine       Relevant Orders   Pneumococcal polysaccharide vaccine 23-valent greater than or equal to 2yo subcutaneous/IM (Completed)      I provided  30 minutes of  face-to-face time during this encounter reviewing patient's current problems and past surgeries, labs and imaging studies, providing counseling on the above mentioned problems , and coordination  of care . I am having Julia Puffenbarger "Susie" start on triamcinolone. I am also having her maintain her multivitamin, B Complex Vitamins (B COMPLEX PO), Vitamin D3, esomeprazole, fluticasone, calcium elemental as carbonate,  Azelaic Acid, blood glucose meter kit and supplies, Accu-Chek FastClix Lancets, methocarbamol, Accu-Chek Guide, tolterodine, sertraline, losartan, amLODipine, buPROPion, metoprolol tartrate, and Januvia. We will continue to administer midazolam, orphenadrine, sodium chloride, triamcinolone acetonide, and diclofenac sodium.  Meds ordered this encounter  Medications  . triamcinolone (KENALOG) 0.1 %    Sig: Apply 1 application topically 2 (two) times daily.    Dispense:  30 g    Refill:  0    There are no discontinued medications.  Follow-up: Return in about 4 months (around 08/03/2020) for follow up diabetes.   Crecencio Mc, MD

## 2020-04-04 NOTE — Patient Instructions (Addendum)
Your annual mammogram has been ordered.  You are encouraged (required) to call to make your appointment at [place] [facility] [phone]   Pneumonia vaccine (uour last one!) was given today.  Postpone your covid booster for 2 weeks   Your diabetes is under excellent control currently.  Please plan to return in 4  months for follow up on diabetes.   Fasting labs can be done  a day or two prior to visit so we can discuss them at your visit.  Continue wellbutrin if you are tolerating the side effects   No changes to blood pressure medications.  (amlodipine in the am,  Losartan in the evening )

## 2020-04-06 NOTE — Assessment & Plan Note (Signed)
Trial of wellbutrin to address concentration and fatigue has improved her symptoms.  Continue medication along with  sertraline for the anxiety

## 2020-04-06 NOTE — Assessment & Plan Note (Signed)
Untreated due to fenofibrate intolerance but again > 300.  She has deferred a repeat trial of statin and fenofibrate , even once weekly  Lab Results  Component Value Date   CHOL 236 (H) 04/02/2020   HDL 40.30 04/02/2020   LDLCALC 88 08/17/2013   LDLDIRECT 141.0 04/02/2020   TRIG 338.0 (H) 04/02/2020   CHOLHDL 6 04/02/2020

## 2020-04-06 NOTE — Assessment & Plan Note (Signed)
Improved control with altered eating habits.  No changes today.  Continue Januvia and losartan.  Lab Results  Component Value Date   HGBA1C 6.3 04/02/2020   Lab Results  Component Value Date   MICROALBUR 1.5 04/02/2020   MICROALBUR 1.5 06/29/2019

## 2020-04-06 NOTE — Assessment & Plan Note (Signed)
Well controlled on current regimen of amlodipine 2.5 mg ,  Metoprolol 25 mg bid,  And losartan 100 mg daily . Renal function stable, no changes today.  Lab Results  Component Value Date   CREATININE 0.80 04/02/2020   Lab Results  Component Value Date   NA 142 04/02/2020   K 3.8 04/02/2020   CL 104 04/02/2020   CO2 30 04/02/2020

## 2020-04-11 ENCOUNTER — Ambulatory Visit: Payer: HMO | Admitting: Pharmacist

## 2020-04-11 DIAGNOSIS — E782 Mixed hyperlipidemia: Secondary | ICD-10-CM

## 2020-04-11 DIAGNOSIS — I429 Cardiomyopathy, unspecified: Secondary | ICD-10-CM

## 2020-04-11 DIAGNOSIS — M791 Myalgia, unspecified site: Secondary | ICD-10-CM

## 2020-04-11 DIAGNOSIS — I10 Essential (primary) hypertension: Secondary | ICD-10-CM

## 2020-04-11 DIAGNOSIS — E1169 Type 2 diabetes mellitus with other specified complication: Secondary | ICD-10-CM

## 2020-04-11 DIAGNOSIS — T466X5A Adverse effect of antihyperlipidemic and antiarteriosclerotic drugs, initial encounter: Secondary | ICD-10-CM

## 2020-04-11 NOTE — Chronic Care Management (AMB) (Signed)
Chronic Care Management   Pharmacy Note  04/11/2020 Name: Julia Pierce MRN: [id number] DOB: April 24, 1955  Subjective:  Julia Pierce is a 64 y.o. year old female who is a primary care patient of Derrel Nip, Aris Everts, MD. The CCM team was consulted for assistance with chronic disease management and care coordination needs.    Engaged with patient by telephone for initial visit in response to provider referral for pharmacy case management and/or care coordination services.   Consent to Services:  Julia Pierce was given the following information about Chronic Care Management services today, agreed to services, and gave verbal consent: 1. CCM service includes personalized support from designated clinical staff supervised by her physician, including individualized plan of care and coordination with other care providers 2. 24/7 contact phone numbers for assistance for urgent and routine care needs. 3. Service will only be billed when office clinical staff spend 20 minutes or more in a month to coordinate care. 4. Only one practitioner may furnish and bill the service in a calendar month. 5.The patient may stop CCM services at any time (effective at the end of the month) by phone call to the office staff. 6. The patient will be responsible for cost sharing (co-pay) of up to 20% of the service fee (after annual deductible is met). Patient agreed to services and consent obtained.  Objective:  Lab Results  Component Value Date   CREATININE 0.80 04/02/2020   CREATININE 0.76 12/29/2019   CREATININE 0.65 06/29/2019    Lab Results  Component Value Date   HGBA1C 6.3 04/02/2020       Component Value Date/Time   CHOL 236 (H) 04/02/2020 0926   TRIG 338.0 (H) 04/02/2020 0926   HDL 40.30 04/02/2020 0926   CHOLHDL 6 04/02/2020 0926   VLDL 67.6 (H) 04/02/2020 0926   LDLCALC 88 08/17/2013 0836   LDLDIRECT 141.0 04/02/2020 0926   Clinical ASCVD: No  The 10-year ASCVD risk score Mikey Bussing DC Jr., et al., 2013) is:  22.3%   Values used to calculate the score:     Age: 57 years     Sex: Female     Is Non-Hispanic African American: No     Diabetic: Yes     Tobacco smoker: No     Systolic Blood Pressure: 938 mmHg     Is BP treated: Yes     HDL Cholesterol: 40.3 mg/dL     Total Cholesterol: 236 mg/dL      BP Readings from Last 3 Encounters:  04/04/20 (!) 148/82  01/31/20 (!) 150/86  01/02/20 (!) 168/84    Assessment/Interventions: Review of patient past medical history, allergies, medications, health status, including review of consultants reports, laboratory and other test data, was performed as part of comprehensive evaluation and provision of chronic care management services.   SDOH (Social Determinants of Health) assessments and interventions performed:  SDOH Interventions   Flowsheet Row Most Recent Value  SDOH Interventions   Financial Strain Interventions Other (Comment)  [reviewed formulary, Medicare principals]  Stress Interventions Other (Comment)  [provided empathetic listening]       CCM Care Plan  Allergies  Allergen Reactions  . Cephalexin Anaphylaxis  . Penicillin G Hives, Shortness Of Breath and Swelling  . Penicillins Hives, Shortness Of Breath and Swelling  . Atorvastatin     Other reaction(s): Muscle Pain myalgias myalgias  . Cymbalta [Duloxetine Hcl] Other (See Comments)  . Duloxetine     Other reaction(s): Muscle Pain Other reaction(s): Muscle Pain  . Fenofibrate  Muscle pain   . Statins     Severe myalgias  . Latex Rash    Medications Reviewed Today    Reviewed by De Hollingshead, RPH-CPP (Pharmacist) on 04/11/20 at [phone]  Med List Status: <None>  Medication Order Taking? Sig Documenting Provider Last Dose Status Informant  Accu-Chek FastClix Lancets MISC [id number] Yes TEST ONCE DAILY E11.9 Crecencio Mc, MD Taking Active   ACCU-CHEK GUIDE test strip [id number] Yes TEST ONCE DAILY E11.9 Crecencio Mc, MD Taking Active   amLODipine (NORVASC) 2.5  MG tablet [id number] Yes Take 1 tablet (2.5 mg total) by mouth daily. Crecencio Mc, MD Taking Active   Azelaic Acid 15 % cream [id number] Yes APPLY TO AFFECTED AREA EVERY DAY [provider] Taking Active   B Complex Vitamins (B COMPLEX PO) [id number] Yes Take 1 tablet by mouth daily.  [provider] Taking Active Self  blood glucose meter kit and supplies [id number] Yes Dispense based on patient and insurance preference. Use to check blood sugar once daily. (FOR ICD-10 E11.9). Crecencio Mc, MD Taking Active   buPROPion (WELLBUTRIN XL) 150 MG 24 hr tablet [id number] Yes TAKE 1 TABLET BY MOUTH EVERY DAY Crecencio Mc, MD Taking Active   calcium elemental as carbonate (BARIATRIC TUMS ULTRA) 400 MG chewable tablet [id number] Yes Chew by mouth. [provider] Taking Active            Med Note Olena Heckle, MICHELE   Wed Apr 01, 2017  3:12 PM)    Cholecalciferol (VITAMIN D3) 2000 UNITS TABS [id number] Yes Take 1 tablet by mouth daily. [provider] Taking Active Self  diclofenac sodium (VOLTAREN) 1 % transdermal gel 4 g [id number]   Mohammed Kindle, MD  Consider Medication Status and Discontinue (Completed Course)   esomeprazole (NEXIUM) 20 MG capsule [id number] Yes Take 10 mg by mouth daily. [provider] Taking Active Self  fluticasone (FLONASE) 50 MCG/ACT nasal spray [id number] Yes Place 2 sprays into both nostrils daily. [provider] Taking Active   JANUVIA 50 MG tablet [id number] Yes TAKE 1 TABLET BY MOUTH EVERY DAY Crecencio Mc, MD Taking Active   losartan (COZAAR) 100 MG tablet [id number] Yes Take 100 mg by mouth daily. [provider] Taking Active   methocarbamol (ROBAXIN) 500 MG tablet [id number] Yes TAKE 1 TABLET BY MOUTH EVERY DAY OR 2 TO 3 TIMES DAILY AS NEEDED Crecencio Mc, MD Taking Active            Med Note De Hollingshead   Wed Apr 11, 2020  9:51 AM) Taking twice daily   metoprolol tartrate (LOPRESSOR) 25 MG tablet  [id number] Yes Take 1 tablet (25 mg total) by mouth 2 (two) times daily. NOTE DOSE INCREASE.  KEEP ON FILE FOR FUTURE REFILLS Crecencio Mc, MD Taking Active   midazolam (VERSED) 5 MG/5ML injection 5 mg [id number]   Mohammed Kindle, MD  Consider Medication Status and Discontinue (Completed Course)   Multiple Vitamin (MULTIVITAMIN) tablet [id number] Yes Take 1 tablet by mouth daily. [provider] Taking Active Self  orphenadrine (NORFLEX) injection 60 mg [id number]   Mohammed Kindle, MD  Consider Medication Status and Discontinue (Completed Course)   sertraline (ZOLOFT) 100 MG tablet [id number] Yes TAKE 1 TABLET BY MOUTH EVERY DAY Crecencio Mc, MD Taking Active   sodium chloride 0.9 % injection 20 mL [id number]   Mohammed Kindle, MD  Consider Medication Status and Discontinue (Completed Course)   tolterodine (DETROL) 2 MG  tablet [id number] Yes TAKE 1 TABLET TWICE A DAY Crecencio Mc, MD Taking Active   triamcinolone (KENALOG) 0.1 % [id number] Yes Apply 1 application topically 2 (two) times daily. Crecencio Mc, MD Taking Active   triamcinolone acetonide [facility]) injection 40 mg [id number]   Mohammed Kindle, MD  Consider Medication Status and Discontinue (Completed Course)   Med List Note Landis Martins, RN 12/31/15 1040): UDS 09-20-15 meds  Due 02-11-16 CS 11/30/2014          Patient Active Problem List   Diagnosis Date Noted  . Depression, major, single episode, mild ([place]) 08/27/2017  . S/P total abdominal hysterectomy 06/12/2016  . Bladder prolapse, female, acquired 06/12/2016  . Mixed hypertriglyceridemia 11/15/2015  . Bilateral occipital neuralgia 11/02/2014  . Cervical facet syndrome 11/02/2014  . Facet syndrome, lumbar 11/02/2014  . Chronic pain syndrome 10/18/2014  . DDD (degenerative disc disease), lumbar 10/03/2014  . DDD (degenerative disc disease), cervical 10/03/2014  . Gout of big toe 08/02/2014  . ETD (eustachian tube dysfunction) 04/25/2014  . History of  shingles 12/07/2013  . S/P placement of cardiac pacemaker 12/07/2013  . Cervical stenosis of spine 05/11/2013  . Erythema migrans (Lyme disease) 12/06/2012  . Statin intolerance 03/03/2012  . Reflux esophagitis 12/31/2011  . Type 2 diabetes mellitus with hypertriglyceridemia ([place]) 09/27/2011  . Obesity (BMI 30.0-34.9) 05/12/2011  . Morton's neuroma   . Cardiomyopathy ([place]) 03/31/2011  . Long QT syndrome 02/05/2011  . Hypertension     Conditions to be addressed/monitored per PCP order: HTN, HLD and DMII  Patient Care Plan: Medication Management    Problem Identified: T2DM, Depression/Anxiety, Hyperlipidemia, Chronic Pain, Gout     Long-Range Goal: Disease Progression Prevention   This Visit's Progress: On track  Priority: High  Note:   Current Barriers:  . Unable to independently afford treatment regimen . Suboptimal therapeutic regimen for ASCVD risk reduction  Pharmacist Clinical Goal(s):  Marland Kitchen Over the next 90 days, patient will verbalize ability to afford treatment regimen through collaboration with PharmD and provider.   Interventions: . 1:1 collaboration with Crecencio Mc, MD regarding development and update of comprehensive plan of care as evidenced by provider attestation and co-signature . Inter-disciplinary care team collaboration (see longitudinal plan of care) . Comprehensive medication review performed; medication list updated in electronic medical record  Diabetes: . Controlled; current treatment: Januvia 50 mg daily  o No hx metformin. Notes that she was struggling with diarrhea when first diagnosed with diabetes, which may have precluded use. . Discussed principals of drug coverage, Tier 3 medications. Discussed that if Januvia becomes too expensive on Medicare, we could consider transition to metformin XR, as this would be more cost effective, vs pursuing patient assistance to continue DPP4.  . Encouraged continued home monitoring. Discussed goal A1c, goal  fasting, and goal 2 hour post prandial glucose. Given cardiomyopathy, may consider SGLT2 moving forward.    Hypertension, Cardiomyopathy; s/p pacer placement: . Controlled; current treatment: amlodipine 2.5 mg daily, losartan 100 mg daily, metoprolol tartrate 25 mg BID . Discussed goal BP. Continue current treatment with home monitoring  Hyperlipidemia: . Uncontrolled; current treatment: none . Medications previously tried: atorvastatin; reported myalgias. Ezetimibe, does not remember reason for discontinuation. Refuses to re-trial other statins due to a family history of statin associated myalgias (mother, father, brother).  . Current dietary patterns: tries to eat lean meats, avoiding saturated fats. Already avoid corn oils d/t association with diarrhea . Education on benefit of statins in DM, lipid lowering  goals. Discussed non-statin options including PCSK9i and bempedoic acid. Would likely avoid bempedoic acid d/t association with increased uric acid levels, and patient has previously not tolerated urate lowering therapy. Patient not interested in additional treatment at this time. Continue to follow with cardiology.   Depression/Anxiety . Moderately well controlled, though notes the holidays are a stressful time; current treatment: sertraline 100 mg daily, bupropion XL 150 mg daily  . Recommended to continue current regimen at this time  Overactive Bladder: . Well controlled; current regimen: tolterodine LA 2 mg BID.  Marland Kitchen Reviewed [employer] Formulary. Tolterodine is Tier 2. Discussed that Tier 1 options have a higher anticholinergic burden and more likely to exacerbate dry mouth, dry eyes, constipation. Patient will continue current regimen at this time  Chronic Pain: . Controlled with current use of methocarbamol 500 mg 2-3 times daily. Hx tizanidine causing too much sedation.  . Reviewed [employer] Formulary. Methocarbamol, cyclobenzaprine Tier 4. Tizanidine Tier 1,  but patient did not tolerate previously. Discussed that if she wants to continue current therapy, could consider pursuing use of GoodRx coverage for methocarbamol. Recommended to continue current regimen at this time. Will discuss alternative treatments for pain relief vs muscle relaxers moving forward.   Rosacea: . Controlled; current regimen; azelaic acid 1%, follows w/ Dr. Aubery Lapping.  . Reviewed [employer] Formulary. Azelaic acid is Tier 4. Patient notes that dermatology has tried other options, but that they were not as effective. She has a supply of medication at home, so will continue for now and communicate with dermatology. Also advised to pursue GoodRx coverage.   GERD: . Controlled per patient report; esomeprazole 20 mg daily PRN . Recommended to continue current regimen at this time  Allergies: . Controlled per patient report; current regimen: fluticasone nasal spray BID PRN.  Marland Kitchen Continue current regimen at this time  Patient Goals/Self-Care Activities . Over the next 90 days, patient will:  - take medications as prescribed collaborate with provider on medication access solutions  Follow Up Plan: Telephone follow up appointment with care management team member scheduled for:  suggested 8 weeks but patient requested call after PCP appt and lab work in April. Scheduled.       Medication Assistance: None required. Patient affirms current coverage meets needs.   Plan: Telephone follow up appointment with care management team member scheduled for: ~14 weeks per patient request  Catie Darnelle Maffucci, PharmD, [place], [place] Clinical Pharmacist [employer] at [facility] [phone]

## 2020-04-11 NOTE — Assessment & Plan Note (Signed)
She has documented intolerance to statins and fenofibrates due to recurrent severe myalgias.  Despite her diagnosis of T2DM and nonischemic CM she has no evidence of atherosclerosis on any imaging (nor on prior cardiac catheterization),  And declines to try once weekly statin

## 2020-04-11 NOTE — Patient Instructions (Addendum)
[name],   It was great talking to you today!  We discussed a few different things:  1) Moving forward, we may discuss some different options for treatment of your diabetes. Januvia is not always the most effective high-cost agent for diabetes. We could consider a trial of time off therapy, a trial of extended release metformin that is generic and affordable, or we could try a different agent (like [place] or [place]) that gives long term benefits in reducing risk of heart attacks, strokes, progression of renal disease, and reduction in heart failure exacerbations. We can also investigate if you would qualify for patient assistance through the medication manufacturers.   2) Tolterodine is one of our better options for overactive bladder, as it has a lower incidence of side effects related to dry mouth, dry eyes, constipation, or sedation. I would not recommend switching you to a cheaper option at this time.   3) Discuss the price of the azelaic acid with your dermatologist.   Call me with any questions or concerns regarding your medications!  Catie Darnelle Maffucci, PharmD [phone]  Visit Information  Patient Care Plan: Medication Management    Problem Identified: T2DM, Depression/Anxiety, Hyperlipidemia, Chronic Pain, Gout     Long-Range Goal: Disease Progression Prevention   This Visit's Progress: On track  Priority: High  Note:   Current Barriers:  . Unable to independently afford treatment regimen . Suboptimal therapeutic regimen for ASCVD risk reduction  Pharmacist Clinical Goal(s):  Marland Kitchen Over the next 90 days, patient will verbalize ability to afford treatment regimen through collaboration with PharmD and provider.   Interventions: . 1:1 collaboration with Crecencio Mc, MD regarding development and update of comprehensive plan of care as evidenced by provider attestation and co-signature . Inter-disciplinary care team collaboration (see longitudinal plan of  care) . Comprehensive medication review performed; medication list updated in electronic medical record  Diabetes: . Controlled; current treatment: Januvia 50 mg daily  o No hx metformin. Notes that she was struggling with diarrhea when first diagnosed with diabetes, which may have precluded use. . Discussed principals of drug coverage, Tier 3 medications. Discussed that if Januvia becomes too expensive on Medicare, we could consider transition to metformin XR, as this would be more cost effective, vs pursuing patient assistance to continue DPP4.  . Encouraged continued home monitoring. Discussed goal A1c, goal fasting, and goal 2 hour post prandial glucose. Given cardiomyopathy, may consider SGLT2 moving forward.    Hypertension, Cardiomyopathy; s/p pacer placement: . Controlled; current treatment: amlodipine 2.5 mg daily, losartan 100 mg daily, metoprolol tartrate 25 mg BID . Discussed goal BP. Continue current treatment with home monitoring  Hyperlipidemia: . Uncontrolled; current treatment: none . Medications previously tried: atorvastatin; reported myalgias. Ezetimibe, does not remember reason for discontinuation. Refuses to re-trial other statins due to a family history of statin associated myalgias (mother, father, brother).  . Current dietary patterns: tries to eat lean meats, avoiding saturated fats. Already avoid corn oils d/t association with diarrhea . Education on benefit of statins in DM, lipid lowering goals. Discussed non-statin options including PCSK9i and bempedoic acid. Would likely avoid bempedoic acid d/t association with increased uric acid levels, and patient has previously not tolerated urate lowering therapy. Patient not interested in additional treatment at this time. Continue to follow with cardiology.   Depression/Anxiety . Moderately well controlled, though notes the holidays are a stressful time; current treatment: sertraline 100 mg daily, bupropion XL 150 mg daily   . Recommended to continue current regimen  at this time  Overactive Bladder: . Well controlled; current regimen: tolterodine LA 2 mg BID.  Marland Kitchen Reviewed [employer] Formulary. Tolterodine is Tier 2. Discussed that Tier 1 options have a higher anticholinergic burden and more likely to exacerbate dry mouth, dry eyes, constipation. Patient will continue current regimen at this time  Chronic Pain: . Controlled with current use of methocarbamol 500 mg 2-3 times daily. Hx tizanidine causing too much sedation.  . Reviewed [employer] Formulary. Methocarbamol, cyclobenzaprine Tier 4. Tizanidine Tier 1, but patient did not tolerate previously. Discussed that if she wants to continue current therapy, could consider pursuing use of GoodRx coverage for methocarbamol. Recommended to continue current regimen at this time. Will discuss alternative treatments for pain relief vs muscle relaxers moving forward.   Rosacea: . Controlled; current regimen; azelaic acid 1%, follows w/ Dr. Aubery Lapping.  . Reviewed [employer] Formulary. Azelaic acid is Tier 4. Patient notes that dermatology has tried other options, but that they were not as effective. She has a supply of medication at home, so will continue for now and communicate with dermatology. Also advised to pursue GoodRx coverage.   GERD: . Controlled per patient report; esomeprazole 20 mg daily PRN . Recommended to continue current regimen at this time  Allergies: . Controlled per patient report; current regimen: fluticasone nasal spray BID PRN.  Marland Kitchen Continue current regimen at this time  Patient Goals/Self-Care Activities . Over the next 90 days, patient will:  - take medications as prescribed collaborate with provider on medication access solutions  Follow Up Plan: Telephone follow up appointment with care management team member scheduled for:  suggested 8 weeks but patient requested call after PCP appt and lab work in April.  Scheduled.       [name] was given information about Chronic Care Management services today including:  1. CCM service includes personalized support from designated clinical staff supervised by her physician, including individualized plan of care and coordination with other care providers 2. 24/7 contact phone numbers for assistance for urgent and routine care needs. 3. Service will only be billed when office clinical staff spend 20 minutes or more in a month to coordinate care. 4. Only one practitioner may furnish and bill the service in a calendar month. 5. The patient may stop CCM services at any time (effective at the end of the month) by phone call to the office staff. 6. The patient will be responsible for cost sharing (co-pay) of up to 20% of the service fee (after annual deductible is met).  Patient agreed to services and verbal consent obtained.   The patient verbalized understanding of instructions, educational materials, and care plan provided today and agreed to receive a mailed copy of patient instructions, educational materials, and care plan.    Plan: Telephone follow up appointment with care management team member scheduled for: ~14 weeks per patient request  Catie Darnelle Maffucci, PharmD, [place], [place] Clinical Pharmacist [employer] at [facility] [phone]

## 2020-04-24 ENCOUNTER — Encounter: Payer: Self-pay | Admitting: Internal Medicine

## 2020-04-24 ENCOUNTER — Other Ambulatory Visit: Payer: Self-pay

## 2020-04-24 ENCOUNTER — Ambulatory Visit (INDEPENDENT_AMBULATORY_CARE_PROVIDER_SITE_OTHER): Payer: HMO | Admitting: Internal Medicine

## 2020-04-24 VITALS — BP 142/80 | HR 70 | Ht 62.0 in | Wt 166.2 lb

## 2020-04-24 DIAGNOSIS — I447 Left bundle-branch block, unspecified: Secondary | ICD-10-CM | POA: Diagnosis not present

## 2020-04-24 DIAGNOSIS — Z950 Presence of cardiac pacemaker: Secondary | ICD-10-CM | POA: Diagnosis not present

## 2020-04-24 DIAGNOSIS — I429 Cardiomyopathy, unspecified: Secondary | ICD-10-CM | POA: Diagnosis not present

## 2020-04-24 LAB — CUP PACEART INCLINIC DEVICE CHECK
Battery Remaining Longevity: 22 mo
Battery Voltage: 2.91 V
Brady Statistic AP VP Percent: 0.06 %
Brady Statistic AP VS Percent: 0 %
Brady Statistic AS VP Percent: 99.9 %
Brady Statistic AS VS Percent: 0.05 %
Brady Statistic RA Percent Paced: 0.06 %
Brady Statistic RV Percent Paced: 99.95 %
Date Time Interrogation Session: 20220104165854
Implantable Lead Implant Date: 20150212
Implantable Lead Implant Date: 20150212
Implantable Lead Implant Date: 20150212
Implantable Lead Location: 753858
Implantable Lead Location: 753859
Implantable Lead Location: 753860
Implantable Lead Model: 4194
Implantable Lead Model: 5076
Implantable Lead Model: 5076
Implantable Pulse Generator Implant Date: 20150212
Lead Channel Impedance Value: 361 Ohm
Lead Channel Impedance Value: 380 Ohm
Lead Channel Impedance Value: 399 Ohm
Lead Channel Impedance Value: 399 Ohm
Lead Channel Impedance Value: 475 Ohm
Lead Channel Impedance Value: 494 Ohm
Lead Channel Impedance Value: 494 Ohm
Lead Channel Impedance Value: 608 Ohm
Lead Channel Impedance Value: 665 Ohm
Lead Channel Pacing Threshold Amplitude: 0.75 V
Lead Channel Pacing Threshold Amplitude: 1 V
Lead Channel Pacing Threshold Amplitude: 1 V
Lead Channel Pacing Threshold Pulse Width: 0.4 ms
Lead Channel Pacing Threshold Pulse Width: 0.4 ms
Lead Channel Pacing Threshold Pulse Width: 0.8 ms
Lead Channel Sensing Intrinsic Amplitude: 14.75 mV
Lead Channel Sensing Intrinsic Amplitude: 18.375 mV
Lead Channel Sensing Intrinsic Amplitude: 2.375 mV
Lead Channel Setting Pacing Amplitude: 1.5 V
Lead Channel Setting Pacing Amplitude: 2 V
Lead Channel Setting Pacing Amplitude: 2.5 V
Lead Channel Setting Pacing Pulse Width: 0.4 ms
Lead Channel Setting Pacing Pulse Width: 0.8 ms
Lead Channel Setting Sensing Sensitivity: 0.9 mV

## 2020-04-24 NOTE — Progress Notes (Signed)
HPI [name] returns today for ongoing evaluation and management of her biventricular pacemaker and chronic systolic heart failure. In the interim she has been stable. Her weight is unchanged. She denies syncope, chest pain, or shortness of breath. She admits to being a little more sedentary. She is frustrated by her inability to lose any weight though she is down 4 lbs since her last visit. Her husband has had a recent stroke. Her bp has not been well controlled. She c/o hair loss. Allergies  Allergen Reactions  . Cephalexin Anaphylaxis  . Penicillin G Hives, Shortness Of Breath and Swelling  . Penicillins Hives, Shortness Of Breath and Swelling  . Atorvastatin     Other reaction(s): Muscle Pain myalgias myalgias  . Cymbalta [Duloxetine Hcl] Other (See Comments)  . Duloxetine     Other reaction(s): Muscle Pain Other reaction(s): Muscle Pain  . Fenofibrate     Muscle pain   . Statins     Severe myalgias  . Latex Rash     Current Outpatient Medications  Medication Sig Dispense Refill  . Accu-Chek FastClix Lancets MISC TEST ONCE DAILY E11.9 102 each 1  . ACCU-CHEK GUIDE test strip TEST ONCE DAILY E11.9 100 strip 1  . amLODipine (NORVASC) 2.5 MG tablet Take 1 tablet (2.5 mg total) by mouth daily. 90 tablet 3  . Azelaic Acid 15 % cream APPLY TO AFFECTED AREA EVERY DAY    . B Complex Vitamins (B COMPLEX PO) Take 1 tablet by mouth daily.     . blood glucose meter kit and supplies Dispense based on patient and insurance preference. Use to check blood sugar once daily. (FOR ICD-10 E11.9). 1 each 0  . buPROPion (WELLBUTRIN XL) 150 MG 24 hr tablet TAKE 1 TABLET BY MOUTH EVERY DAY 90 tablet 1  . calcium elemental as carbonate (BARIATRIC TUMS ULTRA) 400 MG chewable tablet Chew by mouth.    . Cholecalciferol (VITAMIN D3) 2000 UNITS TABS Take 1 tablet by mouth daily.    Marland Kitchen esomeprazole (NEXIUM) 20 MG capsule Take 10 mg by mouth daily.    . fluticasone (FLONASE) 50 MCG/ACT nasal  spray Place 2 sprays into both nostrils daily.    Marland Kitchen JANUVIA 50 MG tablet TAKE 1 TABLET BY MOUTH EVERY DAY 90 tablet 1  . losartan (COZAAR) 100 MG tablet Take 100 mg by mouth daily.    . methocarbamol (ROBAXIN) 500 MG tablet TAKE 1 TABLET BY MOUTH EVERY DAY OR 2 TO 3 TIMES DAILY AS NEEDED 90 tablet 5  . metoprolol tartrate (LOPRESSOR) 100 MG tablet Take 100 mg by mouth 2 (two) times daily.    . Multiple Vitamin (MULTIVITAMIN) tablet Take 1 tablet by mouth daily.    . sertraline (ZOLOFT) 100 MG tablet TAKE 1 TABLET BY MOUTH EVERY DAY 90 tablet 1  . tolterodine (DETROL) 2 MG tablet TAKE 1 TABLET TWICE A DAY 180 tablet 3  . triamcinolone (KENALOG) 0.1 % Apply 1 application topically 2 (two) times daily. 30 g 0   Current Facility-Administered Medications  Medication Dose Route Frequency Provider Last Rate Last Admin  . diclofenac sodium (VOLTAREN) 1 % transdermal gel 4 g  4 g Topical QID Julia Kindle, MD      . midazolam (VERSED) 5 MG/5ML injection 5 mg  5 mg Intravenous Once Julia Kindle, MD      . orphenadrine (NORFLEX) injection 60 mg  60 mg Intramuscular Once Julia Kindle, MD      . sodium chloride  0.9 % injection 20 mL  20 mL Other Once Julia Kindle, MD      . triamcinolone acetonide (KENALOG-40) injection 40 mg  40 mg Other Once Julia Kindle, MD         Past Medical History:  Diagnosis Date  . Arthritis   . Cardiac arrhythmia due to congenital heart disease   . Chicken pox   . Chronic systolic heart failure ([place]) 09/13/2013   EF now 55%  Bu June 2019   ECHO  . Depression   . Headache, frequent episodic tension-type   . High cholesterol   . History of high blood pressure    readings  . Holter monitor, abnormal August 2012   done for long QT.  some contractile asynchrony Julia Pierce)  . Hx of colonoscopy sept 2012   normal,  next due 2022, Julia Pierce  . Hypertension   . Migraines   . Morton's neuroma    bilateral  . Syncope     ROS:   All systems reviewed and negative  except as noted in the HPI.   Past Surgical History:  Procedure Laterality Date  . ABDOMINAL HYSTERECTOMY  Dec 2013   Julia Pierce  . APPENDECTOMY  1973  . BI-VENTRICULAR PACEMAKER INSERTION (CRT-P)  05/2013   MDT CRTP implanted by Julia Pierce for cardiomyopathy, LBBB, and syncope  . HYSTEROSCOPY  2005   heavy bleeding  . IMPLANTABLE CARDIOVERTER DEFIBRILLATOR IMPLANT N/A 06/02/2013   Procedure: IMPLANTABLE CARDIOVERTER DEFIBRILLATOR IMPLANT;  Surgeon: Julia Lance, MD;  Location: [facility] C[facility] LAB;  Service: Cardiovascular;  Laterality: N/A;  . SPINAL FUSION  03/2007   ruptured disc  L5  Julia Pierce  . TONSILLECTOMY AND ADENOIDECTOMY  1966     Family History  Problem Relation Age of Onset  . Arthritis Mother   . Hyperlipidemia Mother   . Cirrhosis Mother   . Arthritis Father   . Cancer Father        prostate cancer   . Hyperlipidemia Father   . Pulmonary embolism Father 73  . Alzheimer's disease Father   . Cancer Other        ovarian,uterus  . Heart disease Other   . Stroke Other   . Learning disabilities Other   . Coronary artery disease Paternal Grandfather 29  . Diabetes Brother   . Diabetes Maternal Uncle      Social History   Socioeconomic History  . Marital status: Married    Spouse name: Not on file  . Number of children: Not on file  . Years of education: Not on file  . Highest education level: Not on file  Occupational History  . Not on file  Tobacco Use  . Smoking status: Never Smoker  . Smokeless tobacco: Never Used  Vaping Use  . Vaping Use: Never used  Substance and Sexual Activity  . Alcohol use: No  . Drug use: No  . Sexual activity: Yes  Other Topics Concern  . Not on file  Social History Narrative  . Not on file   Social Determinants of Health   Financial Resource Strain: Medium Risk  . Difficulty of Paying Living Expenses: Somewhat hard  Food Insecurity: No Food Insecurity  . Worried About Charity fundraiser in the Last Year: Never true  .  Ran Out of Food in the Last Year: Never true  Transportation Needs: No Transportation Needs  . Lack of Transportation (Medical): No  . Lack of Transportation (Non-Medical): No  Physical Activity: Not  on file  Stress: Stress Concern Present  . Feeling of Stress : To some extent  Social Connections: Unknown  . Frequency of Communication with Friends and Family: Not on file  . Frequency of Social Gatherings with Friends and Family: Not on file  . Attends Religious Services: Not on file  . Active Member of Clubs or Organizations: Not on file  . Attends Archivist Meetings: Not on file  . Marital Status: Married  Human resources officer Violence: Not At Risk  . Fear of Current or Ex-Partner: No  . Emotionally Abused: No  . Physically Abused: No  . Sexually Abused: No     BP (!) 142/80   Pulse 70   Ht _0  (1.575 m)   Wt 166 lb 3.2 oz (75.4 kg)   SpO2 96%   BMI 30.40 kg/m   Physical Exam:  Well appearing NAD HEENT: Unremarkable Neck:  No JVD, no thyromegally Lymphatics:  No adenopathy Back:  No CVA tenderness Lungs:  Clear with no wheezes HEART:  Regular rate rhythm, no murmurs, no rubs, no clicks Abd:  soft, positive bowel sounds, no organomegally, no rebound, no guarding Ext:  2 plus pulses, no edema, no cyanosis, no clubbing Skin:  No rashes no nodules Neuro:  CN II through XII intact, motor grossly intact  EKG - nsr with biv pacing  DEVICE  Normal device function.  See PaceArt for details.   Assess/Plan: 1. Chronic systolic heart failure - her symptoms are class 2. She will continue her current meds. 2. CHB - she has no escape today. She is asymptomatic, s/p PPM 3. Hair loss - this could be from the metoprolol. I offered her a switch to coreg but she would like to hold off. 4. HTN - her bp had been really high. I offered to switch meds but since starting the amlodipine the bp has gotten better.   Carleene Overlie Maricela Schreur,MD

## 2020-04-24 NOTE — Patient Instructions (Signed)
Medication Instructions:  °Your physician recommends that you continue on your current medications as directed. Please refer to the Current Medication list given to you today. ° °Labwork: °None ordered. ° °Testing/Procedures: °None ordered. ° °Follow-Up: °Your physician wants you to follow-up in: one year with Dr. Taylor.   You will receive a reminder letter in the mail two months in advance. If you don't receive a letter, please call our office to schedule the follow-up appointment. ° °Remote monitoring is used to monitor your Pacemaker from home. This monitoring reduces the number of office visits required to check your device to one time per year. It allows us to keep an eye on the functioning of your device to ensure it is working properly. You are scheduled for a device check from home on 06/20/2020. You may send your transmission at any time that day. If you have a wireless device, the transmission will be sent automatically. After your physician reviews your transmission, you will receive a postcard with your next transmission date. ° °Any Other Special Instructions Will Be Listed Below (If Applicable). ° °If you need a refill on your cardiac medications before your next appointment, please call your pharmacy.  ° °

## 2020-05-14 MED ORDER — CARVEDILOL 25 MG PO TABS: 25.0000 mg | ORAL_TABLET | Freq: Two times a day (BID) | ORAL | 3 refills | Status: DC

## 2020-06-07 ENCOUNTER — Telehealth: Payer: HMO

## 2020-06-20 ENCOUNTER — Ambulatory Visit (INDEPENDENT_AMBULATORY_CARE_PROVIDER_SITE_OTHER): Payer: HMO

## 2020-06-20 DIAGNOSIS — I447 Left bundle-branch block, unspecified: Secondary | ICD-10-CM | POA: Diagnosis not present

## 2020-06-20 DIAGNOSIS — I428 Other cardiomyopathies: Secondary | ICD-10-CM

## 2020-06-21 LAB — CUP PACEART REMOTE DEVICE CHECK
Battery Remaining Longevity: 21 mo
Battery Voltage: 2.9 V
Brady Statistic AP VP Percent: 0.45 %
Brady Statistic AP VS Percent: 0 %
Brady Statistic AS VP Percent: 99.55 %
Brady Statistic AS VS Percent: 0 %
Brady Statistic RA Percent Paced: 0.45 %
Brady Statistic RV Percent Paced: 99.99 %
Date Time Interrogation Session: 20220302154109
Implantable Lead Implant Date: 20150212
Implantable Lead Implant Date: 20150212
Implantable Lead Implant Date: 20150212
Implantable Lead Location: 753858
Implantable Lead Location: 753859
Implantable Lead Location: 753860
Implantable Lead Model: 4194
Implantable Lead Model: 5076
Implantable Lead Model: 5076
Implantable Pulse Generator Implant Date: 20150212
Lead Channel Impedance Value: 342 Ohm
Lead Channel Impedance Value: 361 Ohm
Lead Channel Impedance Value: 380 Ohm
Lead Channel Impedance Value: 418 Ohm
Lead Channel Impedance Value: 418 Ohm
Lead Channel Impedance Value: 456 Ohm
Lead Channel Impedance Value: 494 Ohm
Lead Channel Impedance Value: 532 Ohm
Lead Channel Impedance Value: 532 Ohm
Lead Channel Pacing Threshold Amplitude: 0.75 V
Lead Channel Pacing Threshold Amplitude: 1 V
Lead Channel Pacing Threshold Amplitude: 1.875 V
Lead Channel Pacing Threshold Pulse Width: 0.4 ms
Lead Channel Pacing Threshold Pulse Width: 0.4 ms
Lead Channel Pacing Threshold Pulse Width: 0.4 ms
Lead Channel Sensing Intrinsic Amplitude: 1.75 mV
Lead Channel Sensing Intrinsic Amplitude: 1.75 mV
Lead Channel Sensing Intrinsic Amplitude: 19.25 mV
Lead Channel Sensing Intrinsic Amplitude: 19.25 mV
Lead Channel Setting Pacing Amplitude: 1.5 V
Lead Channel Setting Pacing Amplitude: 2 V
Lead Channel Setting Pacing Amplitude: 2.5 V
Lead Channel Setting Pacing Pulse Width: 0.4 ms
Lead Channel Setting Pacing Pulse Width: 0.8 ms
Lead Channel Setting Sensing Sensitivity: 0.9 mV

## 2020-06-22 DIAGNOSIS — Z1231 Encounter for screening mammogram for malignant neoplasm of breast: Secondary | ICD-10-CM | POA: Diagnosis not present

## 2020-06-22 DIAGNOSIS — Z124 Encounter for screening for malignant neoplasm of cervix: Secondary | ICD-10-CM | POA: Diagnosis not present

## 2020-06-22 DIAGNOSIS — Z01411 Encounter for gynecological examination (general) (routine) with abnormal findings: Secondary | ICD-10-CM | POA: Diagnosis not present

## 2020-06-22 DIAGNOSIS — Z01419 Encounter for gynecological examination (general) (routine) without abnormal findings: Secondary | ICD-10-CM | POA: Diagnosis not present

## 2020-06-22 DIAGNOSIS — Z6829 Body mass index (BMI) 29.0-29.9, adult: Secondary | ICD-10-CM | POA: Diagnosis not present

## 2020-06-22 LAB — HM MAMMOGRAPHY

## 2020-06-28 ENCOUNTER — Other Ambulatory Visit: Payer: Self-pay | Admitting: Obstetrics and Gynecology

## 2020-06-28 DIAGNOSIS — E2839 Other primary ovarian failure: Secondary | ICD-10-CM

## 2020-06-28 NOTE — Progress Notes (Signed)
Remote pacemaker transmission.   

## 2020-06-29 ENCOUNTER — Other Ambulatory Visit: Payer: Self-pay | Admitting: Internal Medicine

## 2020-07-12 DIAGNOSIS — E782 Mixed hyperlipidemia: Secondary | ICD-10-CM | POA: Diagnosis not present

## 2020-07-12 DIAGNOSIS — I420 Dilated cardiomyopathy: Secondary | ICD-10-CM | POA: Diagnosis not present

## 2020-07-12 DIAGNOSIS — I10 Essential (primary) hypertension: Secondary | ICD-10-CM | POA: Diagnosis not present

## 2020-07-12 DIAGNOSIS — Z950 Presence of cardiac pacemaker: Secondary | ICD-10-CM | POA: Diagnosis not present

## 2020-07-28 ENCOUNTER — Other Ambulatory Visit: Payer: Self-pay | Admitting: Internal Medicine

## 2020-07-30 ENCOUNTER — Telehealth: Payer: Self-pay | Admitting: *Deleted

## 2020-07-30 DIAGNOSIS — E1169 Type 2 diabetes mellitus with other specified complication: Secondary | ICD-10-CM

## 2020-07-30 NOTE — Telephone Encounter (Signed)
Please place future orders for lab appt.  

## 2020-08-02 ENCOUNTER — Other Ambulatory Visit (INDEPENDENT_AMBULATORY_CARE_PROVIDER_SITE_OTHER): Payer: HMO

## 2020-08-02 ENCOUNTER — Other Ambulatory Visit: Payer: Self-pay

## 2020-08-02 DIAGNOSIS — E781 Pure hyperglyceridemia: Secondary | ICD-10-CM

## 2020-08-02 DIAGNOSIS — E1169 Type 2 diabetes mellitus with other specified complication: Secondary | ICD-10-CM

## 2020-08-02 LAB — COMPREHENSIVE METABOLIC PANEL
ALT: 13 U/L (ref 0–35)
AST: 16 U/L (ref 0–37)
Albumin: 4 g/dL (ref 3.5–5.2)
Alkaline Phosphatase: 95 U/L (ref 39–117)
BUN: 19 mg/dL (ref 6–23)
CO2: 29 mEq/L (ref 19–32)
Calcium: 9.2 mg/dL (ref 8.4–10.5)
Chloride: 104 mEq/L (ref 96–112)
Creatinine, Ser: 0.76 mg/dL (ref 0.40–1.20)
GFR: 81.88 mL/min (ref >60.00)
Glucose, Bld: 116 mg/dL — ABNORMAL HIGH (ref 70–99)
Potassium: 4 mEq/L (ref 3.5–5.1)
Sodium: 141 mEq/L (ref 135–145)
Total Bilirubin: 0.4 mg/dL (ref 0.2–1.2)
Total Protein: 7 g/dL (ref 6.0–8.3)

## 2020-08-02 LAB — LIPID PANEL
Cholesterol: 206 mg/dL — ABNORMAL HIGH (ref 0–200)
HDL: 38.2 mg/dL — ABNORMAL LOW (ref >39.00)
NonHDL: 167.71
Total CHOL/HDL Ratio: 5
Triglycerides: 310 mg/dL — ABNORMAL HIGH (ref 0.0–149.0)
VLDL: 62 mg/dL — ABNORMAL HIGH (ref 0.0–40.0)

## 2020-08-02 LAB — HEMOGLOBIN A1C: Hgb A1c MFr Bld: 6.1 % (ref 4.6–6.5)

## 2020-08-02 LAB — LDL CHOLESTEROL, DIRECT: Direct LDL: 129 mg/dL

## 2020-08-04 ENCOUNTER — Ambulatory Visit
Admission: RE | Admit: 2020-08-04 | Discharge: 2020-08-04 | Disposition: A | Discharge status: Home / Self Care | Payer: HMO | Source: Ambulatory Visit | Attending: Obstetrics and Gynecology | Admitting: Obstetrics and Gynecology

## 2020-08-04 ENCOUNTER — Other Ambulatory Visit: Payer: Self-pay

## 2020-08-04 DIAGNOSIS — M810 Age-related osteoporosis without current pathological fracture: Secondary | ICD-10-CM | POA: Diagnosis not present

## 2020-08-04 DIAGNOSIS — E2839 Other primary ovarian failure: Secondary | ICD-10-CM

## 2020-08-04 DIAGNOSIS — Z780 Asymptomatic menopausal state: Secondary | ICD-10-CM | POA: Diagnosis not present

## 2020-08-06 ENCOUNTER — Ambulatory Visit: Payer: HMO | Admitting: Internal Medicine

## 2020-08-13 ENCOUNTER — Ambulatory Visit (INDEPENDENT_AMBULATORY_CARE_PROVIDER_SITE_OTHER): Payer: HMO | Admitting: Pharmacist

## 2020-08-13 DIAGNOSIS — I429 Cardiomyopathy, unspecified: Secondary | ICD-10-CM

## 2020-08-13 DIAGNOSIS — E781 Pure hyperglyceridemia: Secondary | ICD-10-CM

## 2020-08-13 DIAGNOSIS — I10 Essential (primary) hypertension: Secondary | ICD-10-CM

## 2020-08-13 DIAGNOSIS — F320 Major depressive disorder, single episode, mild: Secondary | ICD-10-CM | POA: Diagnosis not present

## 2020-08-13 DIAGNOSIS — E1169 Type 2 diabetes mellitus with other specified complication: Secondary | ICD-10-CM

## 2020-08-13 DIAGNOSIS — I5022 Chronic systolic (congestive) heart failure: Secondary | ICD-10-CM | POA: Diagnosis not present

## 2020-08-13 NOTE — Chronic Care Management (AMB) (Addendum)
Chronic Care Management Pharmacy Note  08/13/2020 Name:  Julia Pierce MRN:  [id number] DOB:  [birth year]  Subjective: Julia Pierce is an 65 y.o. year old female who is a primary patient of Derrel Nip, Aris Everts, MD.  The CCM team was consulted for assistance with disease management and care coordination needs.    Engaged with patient by telephone for initial visit in response to provider referral for pharmacy case management and/or care coordination services.   Consent to Services:  The patient was given information about Chronic Care Management services, agreed to services, and gave verbal consent prior to initiation of services.  Please see initial visit note for detailed documentation.   Patient Care Team: Crecencio Mc, MD as PCP - General (Internal Medicine) De Hollingshead, RPH-CPP (Pharmacist)  Recent office visits: None since last visit  Recent consult visits: 07/12/20 w/Dr. [name] (cardiology)  HTN, cardiomyopathy: no medication changes  04/24/20 w/[name] (EP - cardiology)  Pt reported hair loss, therefore metoprolol 100 mg BID switched to carvedilol 25 mg BID  Biventricular pacemaker, CHF - stable  HTN: BP 142/80 - switched metoprolol 100 mg BID to carvedilol 25 mg BID  Hospital visits: None in previous 6 months  Objective:  Lab Results  Component Value Date   CREATININE 0.76 08/02/2020   CREATININE 0.80 04/02/2020   CREATININE 0.76 12/29/2019    Lab Results  Component Value Date   HGBA1C 6.1 08/02/2020   Last diabetic Eye exam:  Lab Results  Component Value Date/Time   HMDIABEYEEXA No Retinopathy 10/19/2019 12:00 AM    Last diabetic Foot exam:  Lab Results  Component Value Date/Time   HMDIABFOOTEX normaL 12/07/2013 12:00 AM        Component Value Date/Time   CHOL 206 (H) 08/02/2020 0804   TRIG 310.0 (H) 08/02/2020 0804   HDL 38.20 (L) 08/02/2020 0804   CHOLHDL 5 08/02/2020 0804   VLDL 62.0 (H) 08/02/2020 0804   LDLCALC 88 08/17/2013  0836   LDLDIRECT 129.0 08/02/2020 0804    Hepatic Function Latest Ref Rng & Units 08/02/2020 04/02/2020 12/29/2019  Total Protein 6.0 - 8.3 g/dL 7.0 6.7 6.8  Albumin 3.5 - 5.2 g/dL 4.0 4.2 4.4  AST 0 - 37 U/L _0 ALT 0 - 35 U/L _1 Alk Phosphatase 39 - 117 U/L 95 110 109  Total Bilirubin 0.2 - 1.2 mg/dL 0.4 0.4 0.5    Lab Results  Component Value Date/Time   TSH 3.80 12/12/2016 08:39 AM   TSH 3.11 05/09/2013 10:23 AM    CBC Latest Ref Rng & Units 09/11/2016 06/01/2013 05/31/2013  WBC 4.0 - 10.5 K/uL 8.0 9.6 7.8  Hemoglobin 12.0 - 15.0 g/dL 13.6 12.9 13.3  Hematocrit 36.0 - 46.0 % 40.8 37.4 38.3  Platelets 150.0 - 400.0 K/uL 257.0 204 201    Lab Results  Component Value Date/Time   VD25OH 43.82 12/12/2016 08:39 AM    Clinical ASCVD: No  The 10-year ASCVD risk score Mikey Bussing DC Jr., et al., 2013) is: 18.3%   Values used to calculate the score:     Age: 35 years     Sex: Female     Is Non-Hispanic African American: No     Diabetic: Yes     Tobacco smoker: No     Systolic Blood Pressure: 099 mmHg     Is BP treated: Yes     HDL Cholesterol: 38.2 mg/dL     Total Cholesterol: 206 mg/dL  Other: (CHADS2VASc if Afib, PHQ9 if depression, MMRC or CAT for COPD, ACT, DEXA)  Social History   Tobacco Use  Smoking Status Never Smoker  Smokeless Tobacco Never Used   BP Readings from Last 3 Encounters:  04/24/20 (!) 142/80  04/04/20 (!) 148/82  01/31/20 (!) 150/86   Pulse Readings from Last 3 Encounters:  04/24/20 70  04/04/20 78  01/31/20 94   Wt Readings from Last 3 Encounters:  04/24/20 166 lb 3.2 oz (75.4 kg)  04/04/20 165 lb 6.4 oz (75 kg)  03/20/20 168 lb (76.2 kg)    Assessment: Review of patient past medical history, allergies, medications, health status, including review of consultants reports, laboratory and other test data, was performed as part of comprehensive evaluation and provision of chronic care management services.   SDOH:  (Social  Determinants of Health) assessments and interventions performed:  SDOH Interventions   Flowsheet Row Most Recent Value  SDOH Interventions   Financial Strain Interventions Other (Comment)  [Manufacturer assistance pending]      CCM Care Plan  Allergies  Allergen Reactions  . Cephalexin Anaphylaxis  . Penicillin G Hives, Shortness Of Breath and Swelling  . Penicillins Hives, Shortness Of Breath and Swelling  . Atorvastatin     Other reaction(s): Muscle Pain myalgias myalgias  . Cymbalta [Duloxetine Hcl] Other (See Comments)  . Duloxetine     Other reaction(s): Muscle Pain Other reaction(s): Muscle Pain  . Fenofibrate     Muscle pain   . Statins     Severe myalgias  . Latex Rash    Medications Reviewed Today    Reviewed by Avie Arenas, RPH (Pharmacist) on 08/13/20 at [place] List Status: <None>  Medication Order Taking? Sig Documenting Provider Last Dose Status Informant  Accu-Chek FastClix Lancets MISC [id number]  TEST ONCE DAILY E11.9 Crecencio Mc, MD  Active   ACCU-CHEK GUIDE test strip [id number]  TEST ONCE DAILY E11.9 Crecencio Mc, MD  Active   amLODipine (NORVASC) 2.5 MG tablet [id number] Yes Take 1 tablet (2.5 mg total) by mouth daily. Crecencio Mc, MD Taking Active   Azelaic Acid 15 % cream [id number] Yes APPLY TO AFFECTED AREA EVERY DAY [provider] Taking Active   B Complex Vitamins (B COMPLEX PO) [id number] Yes Take 1 tablet by mouth daily.  [provider] Taking Active Self  blood glucose meter kit and supplies [id number]  Dispense based on patient and insurance preference. Use to check blood sugar once daily. (FOR ICD-10 E11.9). Crecencio Mc, MD  Active   buPROPion (WELLBUTRIN XL) 150 MG 24 hr tablet [id number] Yes TAKE 1 TABLET BY MOUTH EVERY DAY Crecencio Mc, MD Taking Active   calcium elemental as carbonate (BARIATRIC TUMS ULTRA) 400 MG chewable tablet [id number] Yes Chew by mouth. [provider] Taking Active             Med Note Olena Heckle, MICHELE   Wed Apr 01, 2017  3:12 PM)    carvedilol (COREG) 25 MG tablet [id number] Yes Take 1 tablet (25 mg total) by mouth 2 (two) times daily. Evans Lance, MD Taking Expired 08/12/20 2359   Cholecalciferol (VITAMIN D3) 2000 UNITS TABS [id number] Yes Take 1 tablet by mouth daily. [provider] Taking Active Self  diclofenac sodium (VOLTAREN) 1 % transdermal gel 4 g [id number]   Mohammed Kindle, MD  Active   esomeprazole (NEXIUM) 20 MG capsule [id number] Yes Take 10 mg by mouth daily. [provider] Taking  Active Self  fluticasone (FLONASE) 50 MCG/ACT nasal spray [id number] Yes Place 2 sprays into both nostrils daily. [provider] Taking Active   JANUVIA 50 MG tablet [id number] Yes TAKE 1 TABLET BY MOUTH EVERY DAY Crecencio Mc, MD Taking Active   losartan (COZAAR) 100 MG tablet [id number] Yes Take 100 mg by mouth daily. [provider] Taking Active   methocarbamol (ROBAXIN) 500 MG tablet [id number] No TAKE 1 TABLET BY MOUTH EVERY DAY OR 2 TO 3 TIMES DAILY AS NEEDED  Patient not taking: Reported on 08/13/2020   Crecencio Mc, MD Not Taking Active            Med Note Kelby Aline Apr 11, 2020  9:51 AM) Taking twice daily   midazolam (VERSED) 5 MG/5ML injection 5 mg [id number]   Mohammed Kindle, MD  Active   Multiple Vitamin (MULTIVITAMIN) tablet [id number] Yes Take 1 tablet by mouth daily. [provider] Taking Active Self  orphenadrine (NORFLEX) injection 60 mg [id number]   Mohammed Kindle, MD  Active   sertraline (ZOLOFT) 100 MG tablet [id number] Yes TAKE 1 TABLET BY MOUTH EVERY DAY Crecencio Mc, MD Taking Active   sodium chloride 0.9 % injection 20 mL [id number]   Mohammed Kindle, MD  Active   tolterodine (DETROL) 2 MG tablet [id number] Yes TAKE 1 TABLET TWICE A DAY Crecencio Mc, MD Taking Active   triamcinolone (KENALOG) 0.1 % [id number] No Apply 1 application topically 2 (two) times daily.  Patient not taking:  Reported on 08/13/2020   Crecencio Mc, MD Not Taking Active   triamcinolone acetonide [facility]) injection 40 mg [id number]   Mohammed Kindle, MD  Active   Med List Note Landis Martins, RN 12/31/15 1040): UDS 09-20-15 meds  Due 02-11-16 CS 11/30/2014          Patient Active Problem List   Diagnosis Date Noted  . LBBB (left bundle branch block) 04/24/2020  . Depression, major, single episode, mild ([place]) 08/27/2017  . S/P total abdominal hysterectomy 06/12/2016  . Bladder prolapse, female, acquired 06/12/2016  . Mixed hypertriglyceridemia 11/15/2015  . Bilateral occipital neuralgia 11/02/2014  . Cervical facet syndrome 11/02/2014  . Facet syndrome, lumbar 11/02/2014  . Chronic pain syndrome 10/18/2014  . DDD (degenerative disc disease), lumbar 10/03/2014  . DDD (degenerative disc disease), cervical 10/03/2014  . Gout of big toe 08/02/2014  . ETD (eustachian tube dysfunction) 04/25/2014  . History of shingles 12/07/2013  . Biventricular cardiac pacemaker in situ 12/07/2013  . Cervical stenosis of spine 05/11/2013  . Erythema migrans (Lyme disease) 12/06/2012  . Myalgia due to statin 03/03/2012  . Reflux esophagitis 12/31/2011  . Type 2 diabetes mellitus with hypertriglyceridemia ([place]) 09/27/2011  . Obesity (BMI 30.0-34.9) 05/12/2011  . Morton's neuroma   . Cardiomyopathy ([place]) 03/31/2011  . Long QT syndrome 02/05/2011  . Hypertension     Immunization History  Administered Date(s) Administered  . Fluad Quad(high Dose 65+) 01/31/2020  . Influenza Split 03/28/2014  . Influenza,inj,Quad PF,6+ Mos 02/07/2013, 04/11/2015, 03/06/2016, 03/18/2017, 03/24/2018, 02/04/2019  . PFIZER(Purple Top)SARS-COV-2 Vaccination 07/29/2019, 08/23/2019  . Pneumococcal Conjugate-13 06/28/2014  . Pneumococcal Polysaccharide-23 05/11/2013, 04/04/2020  . Tdap 02/08/2008, 03/24/2018    Conditions to be addressed/monitored: CHF, HTN, HLD and Depression  Patient Care Plan: Medication Management     Problem Identified: T2DM, Depression/Anxiety, Hyperlipidemia, Chronic Pain, Gout     Long-Range Goal: Disease Progression Prevention   This Visit's Progress: On track  Recent Progress: On  track  Priority: High  Note:   Current Barriers:  . Unable to independently afford treatment regimen . Suboptimal therapeutic regimen for ASCVD risk reduction  Pharmacist Clinical Goal(s):  Marland Kitchen Over the next 90 days, patient will verbalize ability to afford treatment regimen through collaboration with PharmD and provider.   Interventions: . 1:1 collaboration with Crecencio Mc, MD regarding development and update of comprehensive plan of care as evidenced by provider attestation and co-signature . Inter-disciplinary care team collaboration (see longitudinal plan of care) . Comprehensive medication review performed; medication list updated in electronic medical record  Diabetes: . Controlled; current treatment: Januvia 50 mg daily  o No hx metformin. Notes that she was struggling with diarrhea when first diagnosed with diabetes, which may have precluded use. . Reports active in Medicare donut hole and will have to pay 25% of $1400 for 90-day supply of Januvia. Discussed pursuing [employer] patient assistance as patient meets income requirements. Alternatively, discussed trial of Ozempic as patient is interested in additional weight loss and can pursue Novo patient assistance if needed. Ultimately recommended since diabetes controlled, can attempt a trial off of Januvia, monitoring sugars and focus on diet and exercise. Patient will discuss with Dr. Derrel Nip and decide at upcoming PCP visit this week. If continuing Januvia, will pursue patient assistance. If starting Ozempic, can provide sample to assess tolerance, and then pursue patient assistance.  . Used weight watchers in the past and was successuful . Diet: Love carbs and sweets - reducing chocolate intake, eats mainly [place], chicken and fish, canned green  beans, fresh vegetables and fruits (apples, oranges), limits red meats . Exercise: plans to exercise more - swimming exercises, treadmill . Encouraged continued home glucose monitoring.  . Recommended to continue current regimen at the time. Given cardiomyopathy, may consider SGLT2 moving forward. Follow-up appointment schedule in 2 days with PCP.   Hypertension, Cardiomyopathy; s/p pacer placement: . Controlled; current treatment: amlodipine 2.5 mg daily, losartan 100 mg daily, carvedilol 25 mg BID o Hx of metoprolol - hair loss . Hair loss has improved since switching from metoprolol to carvedilol . Home BP readings: 120-130s/75-80s . Recommended to continue current treatment with home monitoring in collaboration with [name] (EP) and Dr. [name] (cardiology)  Hyperlipidemia: . Uncontrolled; current treatment: none . Medications previously tried: atorvastatin; reported myalgias. Ezetimibe, does not remember reason for discontinuation. Refuses to re-trial other statins due to a family history of statin associated myalgias (mother, father, brother).  . Current dietary patterns: tries to eat lean meats, avoiding saturated fats. Already avoid corn oils d/t association with diarrhea . Previously provided education on benefit of statins in DM and lipid lowering goals. Discussed non-statin options including PCSK9i and bempedoic acid. Would likely avoid bempedoic acid d/t association with increased uric acid levels, and patient has previously not tolerated urate lowering therapy. Patient not interested in additional treatment at this time. Continue to follow with cardiology.   Depression/Anxiety . Moderately well controlled, though notes the holidays are a stressful time; current treatment: sertraline 100 mg daily, bupropion XL 150 mg daily  . Recommended to continue current regimen at this time  Overactive Bladder: . Well controlled; current regimen: tolterodine LA 2 mg BID . Previously  reviewed [employer] Formulary. Tolterodine is Tier 2. Discussed that Tier 1 options have a higher anticholinergic burden and more likely to exacerbate dry mouth, dry eyes, constipation. Patient will continue current regimen at this time  Chronic Pain: . Controlled with current use of methocarbamol 500 mg  2-3 times daily. Hx tizanidine causing too much sedation.  . Previously reviewed [employer] Formulary. Methocarbamol, cyclobenzaprine Tier 4. Tizanidine Tier 1, but patient did not tolerate previously. Discussed that if she wants to continue current therapy, could consider pursuing use of GoodRx coverage for methocarbamol.  . Previously recommended to continue current regimen at this time. Will discuss alternative treatments for pain relief vs muscle relaxers moving forward.   Rosacea: . Controlled; current regimen; azelaic acid 1%, follows w/ Dr. Aubery Lapping.  . Previously reviewed [place]. Azelaic acid is Tier 4. Patient notes that dermatology has tried other options, but that they were not as effective. She has a supply of medication at home, so will continue for now and communicate with dermatology. Also advised to pursue GoodRx coverage.   GERD: . Controlled per patient report; esomeprazole 20 mg daily PRN . Recommended to continue current regimen at this time  Allergies: . Controlled per patient report; current regimen: fluticasone nasal spray BID PRN.  Marland Kitchen Continue current regimen at this time  Patient Goals/Self-Care Activities . Over the next 90 days, patient will:  - take medications as prescribed collaborate with provider on medication access solutions  Follow Up Plan: Telephone follow up appointment with care management team member scheduled for:  ~8 weeks      Medication Assistance: Januvia ([place]) or [employer]) patient assistance - TBD  Patient's preferred pharmacy is:  [facility] [place], [place] AT [address] [place] [address] Phone: [phone] Fax: [phone]  [id number] [place] [id number]: [phone]: [phone] Follow Up:  Patient agrees to Care Plan and Follow-up.  Plan: Telephone follow up appointment with care management team member scheduled for:  ~8 weeks  ILorel Monaco PharmD, BCPS PGY2 ASpeed  I was present for this visit and agree with the documentation by the resident as above.   Catie TDarnelle Maffucci PharmD, BSolen CPointe a la HacheClinical Pharmacist LOccidental Petroleumat [place]ouis[place]

## 2020-08-13 NOTE — Patient Instructions (Signed)
  Visit Information  PATIENT GOALS: Goals Addressed              This Visit's Progress   .  Medication Monitoring (pt-stated)        Patient Goals/Self-Care Activities . Over the next 90 days, patient will:  - take medications as prescribed collaborate with provider on medication access solutions.        Patient verbalizes understanding of instructions provided today and agrees to view in MyChart.   Telephone follow up appointment with care management team member scheduled for: ~8 weeks  Patsy Zaragoza, PharmD, BCPS PGY2 Ambulatory Care Resident [facility]    

## 2020-08-15 ENCOUNTER — Encounter: Payer: Self-pay | Admitting: Internal Medicine

## 2020-08-15 ENCOUNTER — Other Ambulatory Visit: Payer: Self-pay

## 2020-08-15 ENCOUNTER — Ambulatory Visit (INDEPENDENT_AMBULATORY_CARE_PROVIDER_SITE_OTHER): Payer: HMO | Admitting: Internal Medicine

## 2020-08-15 DIAGNOSIS — E1169 Type 2 diabetes mellitus with other specified complication: Secondary | ICD-10-CM | POA: Diagnosis not present

## 2020-08-15 DIAGNOSIS — I10 Essential (primary) hypertension: Secondary | ICD-10-CM

## 2020-08-15 DIAGNOSIS — E669 Obesity, unspecified: Secondary | ICD-10-CM | POA: Diagnosis not present

## 2020-08-15 DIAGNOSIS — E781 Pure hyperglyceridemia: Secondary | ICD-10-CM

## 2020-08-15 MED ORDER — ZOSTER VAC RECOMB ADJUVANTED 50 MCG/0.5ML IM SUSR: 0.5000 mL | Freq: Once | INTRAMUSCULAR | 1 refills | Status: AC

## 2020-08-15 NOTE — Patient Instructions (Addendum)
I AGREE that your diabetes so well controlled you can suspend the januvia for a month and follow your blood Sugar readings  I agree with a trial of ozempic, even if your sugars remain at goal,  Because it is  a medication that is taken as a weekly subcutaneous injection and will help you lose weight. It is not insulin.  It does not cause low blood sugars.  It  causes your pancreas to increase its  own insulin secretion  And also slows down the emptying of your stomach,  So it decreases your appetite and helps you lose weight. If you are willing to learn how to take this medication, please let me know and I will send it to your pharmacy.  I recommend the [place] you are feeling better with wellbutrin.

## 2020-08-15 NOTE — Progress Notes (Signed)
Subjective:  Patient ID: Julia Pierce, female    DOB: 28-Oct-1955  Age: 65 y.o. MRN: [id number]  CC: Diagnoses of Type 2 diabetes mellitus with hypertriglyceridemia ([place]), Obesity (BMI 30.0-34.9), and Primary hypertension were pertinent to this visit.  HPI Julia Pierce presents for follow up on type 2 DM, HYPERTENSION AND OBESITY   T2DM:  She  feels generally well,  But is not  exercising regularly or trying to lose weight. Checking  blood sugars less than once daily at variable times, usually only if she feels she may be having a hypoglycemic event. .  BS have been under 130 fasting and < 150 post prandially.  Denies any recent hypoglyemic events.  Taking   medications as directed, including  januvia . Marland Kitchen History of diarrhea with metformin.  Wants  To try Ozempic .Following a carbohydrate modified diet 6 days per week. Denies numbness, burning and tingling of extremities. Appetite is good.     Lab Results  Component Value Date   HGBA1C 6.1 08/02/2020     Outpatient Medications Prior to Visit  Medication Sig Dispense Refill  . Accu-Chek FastClix Lancets MISC TEST ONCE DAILY E11.9 102 each 1  . ACCU-CHEK GUIDE test strip TEST ONCE DAILY E11.9 100 strip 1  . amLODipine (NORVASC) 2.5 MG tablet Take 1 tablet (2.5 mg total) by mouth daily. 90 tablet 3  . Azelaic Acid 15 % cream APPLY TO AFFECTED AREA EVERY DAY    . B Complex Vitamins (B COMPLEX PO) Take 1 tablet by mouth daily.     . blood glucose meter kit and supplies Dispense based on patient and insurance preference. Use to check blood sugar once daily. (FOR ICD-10 E11.9). 1 each 0  . buPROPion (WELLBUTRIN XL) 150 MG 24 hr tablet TAKE 1 TABLET BY MOUTH EVERY DAY 90 tablet 1  . calcium elemental as carbonate (BARIATRIC TUMS ULTRA) 400 MG chewable tablet Chew by mouth.    . carvedilol (COREG) 25 MG tablet Take 1 tablet (25 mg total) by mouth 2 (two) times daily. 180 tablet 3  . Cholecalciferol (VITAMIN D3) 2000 UNITS TABS Take 1 tablet by  mouth daily.    Marland Kitchen esomeprazole (NEXIUM) 20 MG capsule Take 10 mg by mouth daily.    . fluticasone (FLONASE) 50 MCG/ACT nasal spray Place 2 sprays into both nostrils daily.    Marland Kitchen JANUVIA 50 MG tablet TAKE 1 TABLET BY MOUTH EVERY DAY 90 tablet 1  . losartan (COZAAR) 100 MG tablet Take 100 mg by mouth daily.    . methocarbamol (ROBAXIN) 500 MG tablet TAKE 1 TABLET BY MOUTH EVERY DAY OR 2 TO 3 TIMES DAILY AS NEEDED 90 tablet 5  . Multiple Vitamin (MULTIVITAMIN) tablet Take 1 tablet by mouth daily.    . sertraline (ZOLOFT) 100 MG tablet TAKE 1 TABLET BY MOUTH EVERY DAY 90 tablet 1  . tolterodine (DETROL) 2 MG tablet TAKE 1 TABLET TWICE A DAY 180 tablet 3  . triamcinolone (KENALOG) 0.1 % Apply 1 application topically 2 (two) times daily. (Patient not taking: No sig reported) 30 g 0   Facility-Administered Medications Prior to Visit  Medication Dose Route Frequency Provider Last Rate Last Admin  . diclofenac sodium (VOLTAREN) 1 % transdermal gel 4 g  4 g Topical QID Mohammed Kindle, MD      . midazolam (VERSED) 5 MG/5ML injection 5 mg  5 mg Intravenous Once Mohammed Kindle, MD      . orphenadrine (NORFLEX) injection 60 mg  60  mg Intramuscular Once Mohammed Kindle, MD      . sodium chloride 0.9 % injection 20 mL  20 mL Other Once Mohammed Kindle, MD      . triamcinolone acetonide (KENALOG-40) injection 40 mg  40 mg Other Once Mohammed Kindle, MD        Review of Systems;  Patient denies headache, fevers, malaise, unintentional weight loss, skin rash, eye pain, sinus congestion and sinus pain, sore throat, dysphagia,  hemoptysis , cough, dyspnea, wheezing, chest pain, palpitations, orthopnea, edema, abdominal pain, nausea, melena, diarrhea, constipation, flank pain, dysuria, hematuria, urinary  Frequency, nocturia, numbness, tingling, seizures,  Focal weakness, Loss of consciousness,  Tremor, insomnia, depression, anxiety, and suicidal ideation.      Objective:  BP 138/72 (BP Location: Left Arm, Patient  Position: Sitting, Cuff Size: Normal)   Pulse 91   Temp (!) 96.5 F (35.8 C) (Temporal)   Ht '5\' 2"'  (1.575 m)   Wt 165 lb 12.8 oz (75.2 kg)   SpO2 98%   BMI 30.33 kg/m   BP Readings from Last 3 Encounters:  08/15/20 138/72  04/24/20 (!) 142/80  04/04/20 (!) 148/82    Wt Readings from Last 3 Encounters:  08/15/20 165 lb 12.8 oz (75.2 kg)  04/24/20 166 lb 3.2 oz (75.4 kg)  04/04/20 165 lb 6.4 oz (75 kg)    General appearance: alert, cooperative and appears stated age Ears: normal TM's and external ear canals both ears Throat: lips, mucosa, and tongue normal; teeth and gums normal Neck: no adenopathy, no carotid bruit, supple, symmetrical, trachea midline and thyroid not enlarged, symmetric, no tenderness/mass/nodules Back: symmetric, no curvature. ROM normal. No CVA tenderness. Lungs: clear to auscultation bilaterally Heart: regular rate and rhythm, S1, S2 normal, no murmur, click, rub or gallop Abdomen: soft, non-tender; bowel sounds normal; no masses,  no organomegaly Pulses: 2+ and symmetric Skin: Skin color, texture, turgor normal. No rashes or lesions Lymph nodes: Cervical, supraclavicular, and axillary nodes normal.  Lab Results  Component Value Date   HGBA1C 6.1 08/02/2020   HGBA1C 6.3 04/02/2020   HGBA1C 6.5 12/29/2019    Lab Results  Component Value Date   CREATININE 0.76 08/02/2020   CREATININE 0.80 04/02/2020   CREATININE 0.76 12/29/2019    Lab Results  Component Value Date   WBC 8.0 09/11/2016   HGB 13.6 09/11/2016   HCT 40.8 09/11/2016   PLT 257.0 09/11/2016   GLUCOSE 116 (H) 08/02/2020   CHOL 206 (H) 08/02/2020   TRIG 310.0 (H) 08/02/2020   HDL 38.20 (L) 08/02/2020   LDLDIRECT 129.0 08/02/2020   LDLCALC 88 08/17/2013   ALT 13 08/02/2020   AST 16 08/02/2020   NA 141 08/02/2020   K 4.0 08/02/2020   CL 104 08/02/2020   CREATININE 0.76 08/02/2020   BUN 19 08/02/2020   CO2 29 08/02/2020   TSH 3.80 12/12/2016   HGBA1C 6.1 08/02/2020    MICROALBUR 1.5 04/02/2020    DG MOBILE BONE DENSITY  Result Date: 08/06/2020 CLINICAL DATA:  Postmenopausal. History of L5-S1 posterior lumbar spine fusion. EXAM: DUAL X-RAY ABSORPTIOMETRY (DXA) FOR BONE MINERAL DENSITY TECHNIQUE: Bone mineral density measurements are performed of the spine, hip, and forearm, as appropriate, per International Society of Clinical Densitometry recommendations. The pertinent regions of interest are reported below. Non-contributory values are not reported. Images are obtained for bone mineral density measurement and are not obtained for diagnostic purposes. FINDINGS: AP LUMBAR SPINE L1 through L3 Bone Mineral Density (BMD):  1.009 g/cm2 Young Adult  T-Score:  -0.1 Z-Score:  1.7 LEFT FEMUR NECK Bone Mineral Density (BMD):  0.516 g/cm2 Young Adult T-Score: -3.0 Z-Score:  -1.4 LEFT FOREARM (1/3 RADIUS) Bone Mineral Density (BMD):  0.452 Young Adult T Score:  -4.0 Z Score:  -2.3 Unit: This study was performed at [facility] on the [place] (S/N [phone]), software version 13.4.2. Scan quality: The scan quality is good. Exclusions: L5 excluded due to surgical hardware. ASSESSMENT: Patient's diagnostic category is OSTEOPOROSIS by WHO Criteria. FRACTURE RISK: INCREASED FRAX: World Health Organization FRAX assessment of absolute fracture risk is not calculated for this patient because the patient has osteoporosis. COMPARISON: None. RECOMMENDATIONS 1. All patients should optimize calcium and vitamin D intake. 2. Consider FDA-approved medical therapies in postmenopausal women and men aged 56 years and older, based on the following: - A hip or vertebral (clinical or morphometric) fracture - T-score less than or equal to -2.5 at the femoral neck or spine after appropriate evaluation to exclude secondary causes - Low bone mass (T-score between -1.0 and -2.5 at the femoral neck or spine) and a 10-year probability of a hip fracture greater than or equal to 3% or a 10-year  probability of a major osteoporosis-related fracture greater than or equal to 20% based on the [place]-adapted WHO algorithm - Clinician judgment and/or patient preferences may indicate treatment for people with 10-year fracture probabilities above or below these levels 3. Patients with diagnosis of osteoporosis or at high risk for fracture should have regular bone mineral density tests. For patients eligible for Medicare, routine testing is allowed once every 2 years. The testing frequency can be increased to one year for patients who have rapidly progressing disease, those who are receiving or discontinuing medical therapy to restore bone mass, or have additional risk factors. Electronically Signed   By: Lajean Manes M.D.   On: 08/06/2020 14:31    Assessment & Plan:   Problem List Items Addressed This Visit      Unprioritized   Type 2 diabetes mellitus with hypertriglyceridemia ([place])    Well controlled on Januvia,  But unable to lose weight.  Trial of Ozempic planned ; suspend Januvia. Continue losartan .  She is intolerant of statins due to myalgia.   Lab Results  Component Value Date   HGBA1C 6.1 08/02/2020   Lab Results  Component Value Date   MICROALBUR 1.5 04/02/2020   MICROALBUR 1.5 06/29/2019           Obesity (BMI 30.0-34.9)    Starting ozempic for management of diabetes and obesity      Hypertension    Well controlled on current regimen of amlodipine 2.5 mg ,  Metoprolol 25 mg bid,  And losartan 100 mg daily . Renal function stable, no changes today.  Lab Results  Component Value Date   CREATININE 0.76 08/02/2020   Lab Results  Component Value Date   NA 141 08/02/2020   K 4.0 08/02/2020   CL 104 08/02/2020   CO2 29 08/02/2020            I am having Julia Setzer "Susie" start on Zoster Vaccine Adjuvanted. I am also having her maintain her multivitamin, B Complex Vitamins (B COMPLEX PO), Vitamin D3, esomeprazole, fluticasone, calcium elemental as carbonate,  Azelaic Acid, blood glucose meter kit and supplies, Accu-Chek FastClix Lancets, methocarbamol, Accu-Chek Guide, tolterodine, losartan, amLODipine, Januvia, triamcinolone cream, carvedilol, sertraline, and buPROPion. We will continue to administer midazolam, orphenadrine, sodium chloride, triamcinolone acetonide, and diclofenac sodium.  Meds ordered  this encounter  Medications  . Zoster Vaccine Adjuvanted [facility]) injection    Sig: Inject 0.5 mLs into the muscle once for 1 dose.    Dispense:  1 each    Refill:  1    There are no discontinued medications.  Follow-up: Return in about 3 months (around 11/14/2020).   Crecencio Mc, MD

## 2020-08-16 NOTE — Assessment & Plan Note (Signed)
Well controlled on Januvia,  But unable to lose weight.  Trial of Ozempic planned ; suspend Januvia. Continue losartan .  She is intolerant of statins due to myalgia.   Lab Results  Component Value Date   HGBA1C 6.1 08/02/2020   Lab Results  Component Value Date   MICROALBUR 1.5 04/02/2020   MICROALBUR 1.5 06/29/2019

## 2020-08-16 NOTE — Assessment & Plan Note (Signed)
Well controlled on current regimen of amlodipine 2.5 mg ,  Metoprolol 25 mg bid,  And losartan 100 mg daily . Renal function stable, no changes today.  Lab Results  Component Value Date   CREATININE 0.76 08/02/2020   Lab Results  Component Value Date   NA 141 08/02/2020   K 4.0 08/02/2020   CL 104 08/02/2020   CO2 29 08/02/2020

## 2020-08-16 NOTE — Assessment & Plan Note (Signed)
Starting ozempic for management of diabetes and obesity

## 2020-09-19 ENCOUNTER — Ambulatory Visit (INDEPENDENT_AMBULATORY_CARE_PROVIDER_SITE_OTHER): Payer: HMO

## 2020-09-19 DIAGNOSIS — I428 Other cardiomyopathies: Secondary | ICD-10-CM

## 2020-09-20 ENCOUNTER — Encounter: Payer: Self-pay | Admitting: Podiatry

## 2020-09-20 ENCOUNTER — Other Ambulatory Visit: Payer: Self-pay

## 2020-09-20 ENCOUNTER — Ambulatory Visit (INDEPENDENT_AMBULATORY_CARE_PROVIDER_SITE_OTHER): Payer: HMO | Admitting: Podiatry

## 2020-09-20 ENCOUNTER — Ambulatory Visit (INDEPENDENT_AMBULATORY_CARE_PROVIDER_SITE_OTHER): Payer: Medicare Other

## 2020-09-20 DIAGNOSIS — M79672 Pain in left foot: Secondary | ICD-10-CM

## 2020-09-20 DIAGNOSIS — S92354A Nondisplaced fracture of fifth metatarsal bone, right foot, initial encounter for closed fracture: Secondary | ICD-10-CM

## 2020-09-20 DIAGNOSIS — M722 Plantar fascial fibromatosis: Secondary | ICD-10-CM

## 2020-09-20 LAB — CUP PACEART REMOTE DEVICE CHECK
Battery Remaining Longevity: 17 mo
Battery Voltage: 2.89 V
Brady Statistic AP VP Percent: 0.64 %
Brady Statistic AP VS Percent: 0 %
Brady Statistic AS VP Percent: 99.35 %
Brady Statistic AS VS Percent: 0 %
Brady Statistic RA Percent Paced: 0.64 %
Brady Statistic RV Percent Paced: 99.99 %
Date Time Interrogation Session: 20220601133336
Implantable Lead Implant Date: 20150212
Implantable Lead Implant Date: 20150212
Implantable Lead Implant Date: 20150212
Implantable Lead Location: 753858
Implantable Lead Location: 753859
Implantable Lead Location: 753860
Implantable Lead Model: 4194
Implantable Lead Model: 5076
Implantable Lead Model: 5076
Implantable Pulse Generator Implant Date: 20150212
Lead Channel Impedance Value: 323 Ohm
Lead Channel Impedance Value: 361 Ohm
Lead Channel Impedance Value: 380 Ohm
Lead Channel Impedance Value: 399 Ohm
Lead Channel Impedance Value: 418 Ohm
Lead Channel Impedance Value: 418 Ohm
Lead Channel Impedance Value: 456 Ohm
Lead Channel Impedance Value: 513 Ohm
Lead Channel Impedance Value: 532 Ohm
Lead Channel Pacing Threshold Amplitude: 0.75 V
Lead Channel Pacing Threshold Amplitude: 1 V
Lead Channel Pacing Threshold Amplitude: 1.875 V
Lead Channel Pacing Threshold Pulse Width: 0.4 ms
Lead Channel Pacing Threshold Pulse Width: 0.4 ms
Lead Channel Pacing Threshold Pulse Width: 0.4 ms
Lead Channel Sensing Intrinsic Amplitude: 1.125 mV
Lead Channel Sensing Intrinsic Amplitude: 1.125 mV
Lead Channel Sensing Intrinsic Amplitude: 19.25 mV
Lead Channel Sensing Intrinsic Amplitude: 19.25 mV
Lead Channel Setting Pacing Amplitude: 1.5 V
Lead Channel Setting Pacing Amplitude: 2.25 V
Lead Channel Setting Pacing Amplitude: 2.5 V
Lead Channel Setting Pacing Pulse Width: 0.4 ms
Lead Channel Setting Pacing Pulse Width: 0.8 ms
Lead Channel Setting Sensing Sensitivity: 0.9 mV

## 2020-09-23 ENCOUNTER — Other Ambulatory Visit: Payer: Self-pay | Admitting: Internal Medicine

## 2020-09-25 NOTE — Progress Notes (Signed)
Subjective:   Patient ID: Julia Pierce, female   DOB: 65 y.o.   MRN: [id number]   HPI Patient states she had a painful fall 4 days ago and that she is having a lot of pain in the outside of her left foot and is having trouble bearing weight.  States that she fell on a curb and hurt her foot at that time and does not smoke likes to be active   Review of Systems  All other systems reviewed and are negative.       Objective:  Physical Exam Vitals and nursing note reviewed.  Constitutional:      Appearance: She is well-developed.  Pulmonary:     Effort: Pulmonary effort is normal.  Musculoskeletal:        General: Normal range of motion.  Skin:    General: Skin is warm.  Neurological:     Mental Status: She is alert.     Neurovascular status intact muscle strength found to be adequate range of motion adequate.  Patient is noted to have exquisite discomfort in the left lateral foot midfoot region with swelling noted pitting edema +2 negative Homans' sign noted with ecchymosis around the area no breaks in skin.  Good digital perfusion     Assessment:  Fracture of the fifth metatarsal left versus sprain or other soft tissue pathology     Plan:  H&P x-ray reviewed and today I went ahead and I have recommended immobilization with boot due to midfoot fifth metatarsal fracture.  Advised her on compression elevation ice therapy and hopeful return to gradual shoe gear usage in the next 3 weeks  X-rays indicate fracture of the fifth metatarsal midshaft left appears nondisplaced

## 2020-10-04 DIAGNOSIS — I420 Dilated cardiomyopathy: Secondary | ICD-10-CM | POA: Diagnosis not present

## 2020-10-08 ENCOUNTER — Ambulatory Visit (INDEPENDENT_AMBULATORY_CARE_PROVIDER_SITE_OTHER): Payer: HMO | Admitting: Pharmacist

## 2020-10-08 DIAGNOSIS — E781 Pure hyperglyceridemia: Secondary | ICD-10-CM | POA: Diagnosis not present

## 2020-10-08 DIAGNOSIS — E1169 Type 2 diabetes mellitus with other specified complication: Secondary | ICD-10-CM | POA: Diagnosis not present

## 2020-10-08 DIAGNOSIS — E669 Obesity, unspecified: Secondary | ICD-10-CM

## 2020-10-08 DIAGNOSIS — I10 Essential (primary) hypertension: Secondary | ICD-10-CM

## 2020-10-08 MED ORDER — OZEMPIC (0.25 OR 0.5 MG/DOSE) 2 MG/1.5ML ~~LOC~~ SOPN: PEN_INJECTOR | SUBCUTANEOUS | 0 refills | Status: DC

## 2020-10-08 NOTE — Chronic Care Management (AMB) (Addendum)
Chronic Care Management Pharmacy Note  10/08/2020 Name:  Bree Heinzelman MRN:  [id number] DOB:  February 02, 1956  Subjective: Julia Pierce is an 65 y.o. year old female who is a primary patient of Derrel Nip, Aris Everts, MD.  The CCM team was consulted for assistance with disease management and care coordination needs.    Engaged with patient by telephone for follow up visit in response to provider referral for pharmacy case management and/or care coordination services.   Consent to Services:  The patient was given information about Chronic Care Management services, agreed to services, and gave verbal consent prior to initiation of services.  Please see initial visit note for detailed documentation.   Patient Care Team: Crecencio Mc, MD as PCP - General (Internal Medicine) De Hollingshead, RPH-CPP (Pharmacist)  Recent office visits: 4/27 Derrel Nip (PCP) Trying to lose weight BG <130 fasting and <150 post-prandial Taking Januvia as prescribed  Interested in Purdin for weight loss benefits  Recent consult visits: 6/2 - Podiatry Painful fall 4 days prior to visit - pain outside left foot; fell on a curb and hurt her foot at that time X-rays indicate fracture of the fifth metatarsal midshaft left appears nondispla Recommended boot on left foot  Hospital visits: None in previous 6 months  Objective:  Lab Results  Component Value Date   CREATININE 0.76 08/02/2020   CREATININE 0.80 04/02/2020   CREATININE 0.76 12/29/2019    Lab Results  Component Value Date   HGBA1C 6.1 08/02/2020   Last diabetic Eye exam:  Lab Results  Component Value Date/Time   HMDIABEYEEXA No Retinopathy 10/19/2019 12:00 AM    Last diabetic Foot exam:  Lab Results  Component Value Date/Time   HMDIABFOOTEX normaL 12/07/2013 12:00 AM        Component Value Date/Time   CHOL 206 (H) 08/02/2020 0804   TRIG 310.0 (H) 08/02/2020 0804   HDL 38.20 (L) 08/02/2020 0804   CHOLHDL 5 08/02/2020 0804   VLDL  62.0 (H) 08/02/2020 0804   LDLCALC 88 08/17/2013 0836   LDLDIRECT 129.0 08/02/2020 0804    Hepatic Function Latest Ref Rng & Units 08/02/2020 04/02/2020 12/29/2019  Total Protein 6.0 - 8.3 g/dL 7.0 6.7 6.8  Albumin 3.5 - 5.2 g/dL 4.0 4.2 4.4  AST 0 - 37 U/L '16 15 19  ' ALT 0 - 35 U/L '13 12 15  ' Alk Phosphatase 39 - 117 U/L 95 110 109  Total Bilirubin 0.2 - 1.2 mg/dL 0.4 0.4 0.5    Lab Results  Component Value Date/Time   TSH 3.80 12/12/2016 08:39 AM   TSH 3.11 05/09/2013 10:23 AM    CBC Latest Ref Rng & Units 09/11/2016 06/01/2013 05/31/2013  WBC 4.0 - 10.5 K/uL 8.0 9.6 7.8  Hemoglobin 12.0 - 15.0 g/dL 13.6 12.9 13.3  Hematocrit 36.0 - 46.0 % 40.8 37.4 38.3  Platelets 150.0 - 400.0 K/uL 257.0 204 201    Lab Results  Component Value Date/Time   VD25OH 43.82 12/12/2016 08:39 AM    Clinical ASCVD: No  The 10-year ASCVD risk score Mikey Bussing DC Jr., et al., 2013) is: 20.3%   Values used to calculate the score:     Age: 7 years     Sex: Female     Is Non-Hispanic African American: No     Diabetic: Yes     Tobacco smoker: No     Systolic Blood Pressure: 517 mmHg     Is BP treated: Yes     HDL  Cholesterol: 38.2 mg/dL     Total Cholesterol: 206 mg/dL    Social History   Tobacco Use  Smoking Status Never  Smokeless Tobacco Never   BP Readings from Last 3 Encounters:  08/15/20 138/72  04/24/20 (!) 142/80  04/04/20 (!) 148/82   Pulse Readings from Last 3 Encounters:  08/15/20 91  04/24/20 70  04/04/20 78   Wt Readings from Last 3 Encounters:  08/15/20 165 lb 12.8 oz (75.2 kg)  04/24/20 166 lb 3.2 oz (75.4 kg)  04/04/20 165 lb 6.4 oz (75 kg)    Assessment: Review of patient past medical history, allergies, medications, health status, including review of consultants reports, laboratory and other test data, was performed as part of comprehensive evaluation and provision of chronic care management services.   SDOH:  (Social Determinants of Health) assessments and  interventions performed: none today   CCM Care Plan  Allergies  Allergen Reactions   Cephalexin Anaphylaxis   Penicillin G Hives, Shortness Of Breath and Swelling   Penicillins Hives, Shortness Of Breath and Swelling   Atorvastatin     Other reaction(s): Muscle Pain myalgias myalgias   Cymbalta [Duloxetine Hcl] Other (See Comments)   Duloxetine     Other reaction(s): Muscle Pain Other reaction(s): Muscle Pain   Fenofibrate     Muscle pain    Statins     Severe myalgias   Latex Rash    Medications Reviewed Today     Reviewed by Avie Arenas, RPH (Pharmacist) on 10/08/20 at [place]  Med List Status: <None>   Medication Order Taking? Sig Documenting Provider Last Dose Status Informant  Accu-Chek FastClix Lancets MISC [id number]  TEST ONCE DAILY E11.9 Crecencio Mc, MD  Active   ACCU-CHEK GUIDE test strip [id number]  TEST ONCE DAILY E11.9 Crecencio Mc, MD  Active   amLODipine (NORVASC) 2.5 MG tablet [id number] Yes Take 1 tablet (2.5 mg total) by mouth daily. Crecencio Mc, MD Taking Active            Med Note Albin Felling, [place]   Wed Aug 15, 2020 11:11 AM) In the evening.  Azelaic Acid 15 % cream [id number] Yes APPLY TO AFFECTED AREA EVERY DAY [provider] Taking Active            Med Note Albin Felling, [place]   Wed Aug 15, 2020 11:11 AM) BID in AM & PM  B Complex Vitamins (B COMPLEX PO) [id number] Yes Take 1 tablet by mouth daily.  [provider] Taking Active Self  blood glucose meter kit and supplies [id number]  Dispense based on patient and insurance preference. Use to check blood sugar once daily. (FOR ICD-10 E11.9). Crecencio Mc, MD  Active   buPROPion (WELLBUTRIN XL) 150 MG 24 hr tablet [id number] Yes TAKE 1 TABLET BY MOUTH EVERY DAY Crecencio Mc, MD Taking Active   calcium elemental as carbonate (BARIATRIC TUMS ULTRA) 400 MG chewable tablet [id number] Yes Chew by mouth. [provider] Taking Active            Med Note Olena Heckle, MICHELE   Wed Apr 01, 2017  3:12 PM)    carvedilol (COREG) 25 MG tablet [id number] Yes Take 1 tablet (25 mg total) by mouth 2 (two) times daily. Evans Lance, MD Taking Active   Cholecalciferol (VITAMIN D3) 2000 UNITS TABS [id number] Yes Take 1 tablet by mouth daily. [provider] Taking Active Self  diclofenac sodium (VOLTAREN) 1 % transdermal gel 4 g  [id number]   Mohammed Kindle, MD  Active   esomeprazole (NEXIUM) 20 MG capsule [id number] Yes Take 10 mg by mouth daily. [provider] Taking Active Self  fluticasone (FLONASE) 50 MCG/ACT nasal spray [id number] Yes Place 2 sprays into both nostrils daily. [provider] Taking Active            Med Note Sudie Bailey, Erna Brossard A   Mon Oct 08, 2020  9:02 AM) Taking PRN  JANUVIA 50 MG tablet [id number] No TAKE 1 TABLET BY MOUTH EVERY DAY  Patient not taking: Reported on 10/08/2020   Crecencio Mc, MD Not Taking Active            Med Note [facility], Cherril Hett A   Mon Oct 08, 2020  9:03 AM) Not taking since May  losartan (COZAAR) 100 MG tablet [id number] Yes Take 100 mg by mouth daily. [provider] Taking Active            Med Note Albin Felling, [facility] E   Wed Aug 15, 2020 11:12 AM) In the evening.   midazolam (VERSED) 5 MG/5ML injection 5 mg [id number]   Mohammed Kindle, MD  Consider Medication Status and Discontinue (Completed Course)   Multiple Vitamin (MULTIVITAMIN) tablet [id number] Yes Take 1 tablet by mouth daily. [provider] Taking Active Self  orphenadrine (NORFLEX) injection 60 mg [id number]   Mohammed Kindle, MD  Consider Medication Status and Discontinue (Completed Course)   sertraline (ZOLOFT) 100 MG tablet [id number] Yes TAKE 1 TABLET BY MOUTH EVERY DAY Crecencio Mc, MD Taking Active   sodium chloride 0.9 % injection 20 mL [id number]   Mohammed Kindle, MD  Consider Medication Status and Discontinue (Completed Course)   tolterodine (DETROL) 2 MG tablet [id number] Yes TAKE 1 TABLET TWICE A DAY Crecencio Mc, MD Taking Active    triamcinolone (KENALOG) 0.1 % [id number] Yes Apply 1 application topically 2 (two) times daily. Crecencio Mc, MD Taking Active   triamcinolone acetonide [facility]) injection 40 mg [id number]   Mohammed Kindle, MD  Consider Medication Status and Discontinue (Completed Course)   Med List Note Landis Martins, RN 12/31/15 1040): UDS 09-20-15 meds  Due 02-11-16 CS 11/30/2014            Patient Active Problem List   Diagnosis Date Noted   LBBB (left bundle branch block) 04/24/2020   Depression, major, single episode, mild ([place]) 08/27/2017   S/P total abdominal hysterectomy 06/12/2016   Bladder prolapse, female, acquired 06/12/2016   Bilateral occipital neuralgia 11/02/2014   Cervical facet syndrome 11/02/2014   Facet syndrome, lumbar 11/02/2014   Chronic pain syndrome 10/18/2014   DDD (degenerative disc disease), lumbar 10/03/2014   DDD (degenerative disc disease), cervical 10/03/2014   Gout of big toe 08/02/2014   ETD (eustachian tube dysfunction) 04/25/2014   History of shingles 12/07/2013   Biventricular cardiac pacemaker in situ 12/07/2013   Cervical stenosis of spine 05/11/2013   Erythema migrans (Lyme disease) 12/06/2012   Myalgia due to statin 03/03/2012   Reflux esophagitis 12/31/2011   Type 2 diabetes mellitus with hypertriglyceridemia ([place]) 09/27/2011   Obesity (BMI 30.0-34.9) 05/12/2011   Morton's neuroma    Cardiomyopathy ([place]) 03/31/2011   Long QT syndrome 02/05/2011   Hypertension     Immunization History  Administered Date(s) Administered   Fluad Quad(high Dose 65+) 01/31/2020   Influenza Split 03/28/2014   Influenza,inj,Quad PF,6+ Mos 02/07/2013, 04/11/2015, 03/06/2016, 03/18/2017, 03/24/2018, 02/04/2019   PFIZER(Purple Top)SARS-COV-2 Vaccination 07/29/2019, 08/23/2019   Pneumococcal Conjugate-13 06/28/2014  Pneumococcal Polysaccharide-23 05/11/2013, 04/04/2020   Tdap 02/08/2008, 03/24/2018    Conditions to be addressed/monitored: CHF, HTN, HLD,  DMII, and Depression  Care Plan : Medication Management  Updates made by Kavitha Lansdale A, RPH since 10/08/2020 12:00 AM     Problem: T2DM, Depression/Anxiety, Hyperlipidemia, Chronic Pain, Gout      Long-Range Goal: Disease Progression Prevention   This Visit's Progress: On track  Recent Progress: On track  Priority: High  Note:   Current Barriers:  Unable to independently afford treatment regimen Suboptimal therapeutic regimen for ASCVD risk reduction  Pharmacist Clinical Goal(s):  Over the next 90 days, patient will verbalize ability to afford treatment regimen through collaboration with PharmD and provider.   Interventions: 1:1 collaboration with Crecencio Mc, MD regarding development and update of comprehensive plan of care as evidenced by provider attestation and co-signature Inter-disciplinary care team collaboration (see longitudinal plan of care) Comprehensive medication review performed; medication list updated in electronic medical record  Diabetes: Controlled; current treatment: Januvia 50 mg daily (not taking) No hx metformin. Notes that she was struggling with diarrhea when first diagnosed with diabetes, which may have precluded use. Reports self-discontinuing Januvia due to no added benefit of weight loss and interested in starting Ozempic. Glucose readings: fasting 137, 112; after meals 190, 169, 166, 174 (denies BG >200) Denies hypoglycemic episodes Used weight watchers in the past and was successuful Diet: has not been paying attention to diet lately; love carbs and sweets - reducing chocolate intake, eats mainly [place], chicken and fish, canned green beans, fresh vegetables and fruits (apples, oranges), limits red meats Exercise: currently limited due to wearing boot on left foot, but plans to exercise more - swimming exercises, treadmill Discussed the importance of eating a diabetes-friendly diet  Encouraged continued home glucose monitoring.  Counseled on GLP1  agonists, including mechanism of action, side effects, and benefits. No personal or family history of medullary thyroid cancer, personal history of pancreatitis or gallbladder disease. Counseled on potential side effects of nausea, stomach upset, queasiness, constipation, and that these generally improve over time. Advised to contact our office with more severe symptoms, including nausea, diarrhea, stomach pain. Patient verbalized understanding Will discontinue Januvia. Will provide samples and trial Ozempic 0.25 mg weekly x4 weeks, and then pursue patient assistance if tolerating well.   ADDENDUM: patient presented for teaching. Demonstrated injection technique. Patient delivered first injection successfully  Hypertension, Cardiomyopathy; s/p pacer placement: Controlled; current treatment: amlodipine 2.5 mg daily, losartan 100 mg daily, carvedilol 25 mg BID Hx of metoprolol - hair loss Hair loss has improved since switching from metoprolol to carvedilol Recommended to continue current treatment with home monitoring in collaboration with [name] (EP) and Dr. [name] (cardiology)  Hyperlipidemia: Uncontrolled; current treatment: none Medications previously tried: atorvastatin; reported myalgias. Ezetimibe, does not remember reason for discontinuation. Refuses to re-trial other statins due to a family history of statin associated myalgias (mother, father, brother).  Current dietary patterns: tries to eat lean meats, avoiding saturated fats. Already avoid corn oils d/t association with diarrhea Previously provided education on benefit of statins in DM and lipid lowering goals. Discussed non-statin options including PCSK9i and bempedoic acid. Would likely avoid bempedoic acid d/t association with increased uric acid levels, and patient has previously not tolerated urate lowering therapy. Patient not interested in additional treatment at this time. Continue to follow with cardiology.    Depression/Anxiety Moderately well controlled, though notes the holidays are a stressful time; current treatment: sertraline 100 mg daily, bupropion XL  150 mg daily  Recommended to continue current regimen at this time  Overactive Bladder: Well controlled; current regimen: tolterodine LA 2 mg BID Previously recommended to continue current regimen at this time  Chronic Pain: Controlled with current use of methocarbamol 500 mg 2-3 times daily. Hx tizanidine causing too much sedation.  Previously recommended to continue current regimen at this time. Will discuss alternative treatments for pain relief vs muscle relaxers moving forward.   Rosacea: Controlled; current regimen; azelaic acid 1%, follows w/ Dr. Aubery Lapping.  Previously encouraged to continue current regimen along with collaboration with dermatology  GERD: Controlled per patient report; esomeprazole 20 mg daily PRN Recommended to continue current regimen at this time  Allergies: Controlled per patient report; current regimen: fluticasone nasal spray BID PRN.  Continue current regimen at this time  Patient Goals/Self-Care Activities Over the next 90 days, patient will:  - take medications as prescribed collaborate with provider on medication access solutions  Follow Up Plan: Telephone follow up appointment with care management team member scheduled for:  ~5 weeks      Medication Assistance: Ozempic [place]) patient assistance - TBD  Patient's preferred pharmacy is:  [facility] [place], [address] [place] [address] Phone: [phone] Fax: [phone]  [id number] [place] [place] [address] [place]Phone: [phone]: [phone] Follow Up:  Patient agrees to Care Plan and Follow-up.  Plan: Telephone follow up appointment  with care management team member scheduled for:  5 weeks  ILorel Monaco PharmD, BCPS PGY2 [place]  I was present for this visit and agree with the documentation by the resident as above.   Catie TDarnelle Maffucci PharmD, [place] CMalcolmClinical Pharmacist LOccidental Petroleumat BNason

## 2020-10-08 NOTE — Patient Instructions (Signed)
Visit Information  PATIENT GOALS:  Goals Addressed               This Visit's Progress     Medication Monitoring (pt-stated)        Patient Goals/Self-Care Activities Over the next 90 days, patient will:  - take medications as prescribed - collaborate with provider on medication access solutions.         Patient verbalizes understanding of instructions provided today and agrees to view in [place].   Telephone follow up appointment with care management team member scheduled for: ~5 weeks.  Lorel Monaco, PharmD, [place] PGY2 Ambulatory Care Resident [place]

## 2020-10-08 NOTE — Addendum Note (Signed)
Addended by: De Hollingshead on: 10/08/2020 10:18 AM   Modules accepted: Orders

## 2020-10-09 ENCOUNTER — Encounter: Payer: Self-pay | Admitting: Internal Medicine

## 2020-10-09 ENCOUNTER — Telehealth (INDEPENDENT_AMBULATORY_CARE_PROVIDER_SITE_OTHER): Payer: HMO | Admitting: Internal Medicine

## 2020-10-09 DIAGNOSIS — E781 Pure hyperglyceridemia: Secondary | ICD-10-CM

## 2020-10-09 DIAGNOSIS — K591 Functional diarrhea: Secondary | ICD-10-CM | POA: Diagnosis not present

## 2020-10-09 DIAGNOSIS — E1169 Type 2 diabetes mellitus with other specified complication: Secondary | ICD-10-CM | POA: Diagnosis not present

## 2020-10-09 MED ORDER — DIPHENOXYLATE-ATROPINE 2.5-0.025 MG PO TABS: 1.0000 | ORAL_TABLET | Freq: Four times a day (QID) | ORAL | 1 refills | Status: DC | PRN

## 2020-10-09 NOTE — Progress Notes (Signed)
Virtual Visit via [facility] Note  This visit type was conducted due to national recommendations for restrictions regarding the COVID-19 pandemic (e.g. social distancing).  This format is felt to be most appropriate for this patient at this time.  All issues noted in this document were discussed and addressed.  No physical exam was performed (except for noted visual exam findings with Video Visits).   I connected withNAME@ on 10/09/20 at  4:30 PM EDT by a video enabled telemedicine application  and verified that I am speaking with the correct person using two identifiers. Location patient: home Location provider: work or home office Persons participating in the virtual visit: patient, provider  I discussed the limitations, risks, security and privacy concerns of performing an evaluation and management service by telephone and the availability of in person appointments. I also discussed with the patient that there may be a patient responsible charge related to this service. The patient expressed understanding and agreed to proceed.  Interactive audio and video telecommunications were attempted between this provider and patient, however failed, due to patient having technical difficulties OR patient did not have access to video capability.  We continued and completed visit with audio only.   Reason for visit: medication change/type 2 DM with obesity   HPI:   Type 2 DM  she took her first ozempic dose yesterday. She stopped Januvia May 8.   Cbgs have been 120 or less  post prandials < 180, most < 160.  No side effects thus far   Recurrent episodes of diarrhea, chronic for over a year.  Has episodes  that last several days , then resolve, and are accompanied by  fecal urgency and occasional incontinence.  No history of blood in stools,  fevers,  abd pain or unintentional weight loss .   Last week had a flare, lasting several days,  tried taking Imodium every 4 hours last seek,  no change.   Last  colonoscopy 2012  diverticulosis  noted.   ECHO repeated June 16  55% EF . Taking metoprolol, amlodipine and losartan .  Home readings 116/70  to 132/81  ROS: See pertinent positives and negatives per HPI.  Past Medical History:  Diagnosis Date   Arthritis    Cardiac arrhythmia due to congenital heart disease    Chicken pox    Chronic systolic heart failure ([place]) 09/13/2013   EF now 55%  Bu June 2019   ECHO   Depression    Headache, frequent episodic tension-type    High cholesterol    History of high blood pressure    readings   Holter monitor, abnormal August 2012   done for long QT.  some contractile asynchrony [name])   Hx of colonoscopy sept 2012   normal,  next due 2022, [name]   Hypertension    Migraines    Morton's neuroma    bilateral   Syncope     Past Surgical History:  Procedure Laterality Date   ABDOMINAL HYSTERECTOMY  Dec 2013   [place]   APPENDECTOMY  1973   BI-VENTRICULAR PACEMAKER INSERTION (CRT-P)  05/2013   MDT CRTP implanted by Dr Lovena Le for cardiomyopathy, LBBB, and syncope   HYSTEROSCOPY  2005   heavy bleeding   IMPLANTABLE CARDIOVERTER DEFIBRILLATOR IMPLANT N/A 06/02/2013   Procedure: IMPLANTABLE CARDIOVERTER DEFIBRILLATOR IMPLANT;  Surgeon: Evans Lance, MD;  Location: [facility] CATH LAB;  Service: Cardiovascular;  Laterality: N/A;   SPINAL FUSION  03/2007   ruptured disc  L5  Max  [place]   TONSILLECTOMY AND ADENOIDECTOMY  1966    Family History  Problem Relation Age of Onset   Arthritis Mother    Hyperlipidemia Mother    Cirrhosis Mother    Arthritis Father    Cancer Father        prostate cancer    Hyperlipidemia Father    Pulmonary embolism Father 59   Alzheimer's disease Father    Cancer Other        ovarian,uterus   Heart disease Other    Stroke Other    Learning disabilities Other    Coronary artery disease Paternal Grandfather 74   Diabetes Brother    Diabetes Maternal Uncle     SOCIAL HX:  reports that she has never smoked. She  has never used smokeless tobacco. She reports that she does not drink alcohol and does not use drugs.    Current Outpatient Medications:    Accu-Chek FastClix Lancets MISC, TEST ONCE DAILY E11.9, Disp: 102 each, Rfl: 1   ACCU-CHEK GUIDE test strip, TEST ONCE DAILY E11.9, Disp: 100 strip, Rfl: 1   amLODipine (NORVASC) 2.5 MG tablet, Take 1 tablet (2.5 mg total) by mouth daily., Disp: 90 tablet, Rfl: 3   Azelaic Acid 15 % cream, APPLY TO AFFECTED AREA EVERY DAY, Disp: , Rfl:    B Complex Vitamins (B COMPLEX PO), Take 1 tablet by mouth daily. , Disp: , Rfl:    blood glucose meter kit and supplies, Dispense based on patient and insurance preference. Use to check blood sugar once daily. (FOR ICD-10 E11.9)., Disp: 1 each, Rfl: 0   buPROPion (WELLBUTRIN XL) 150 MG 24 hr tablet, TAKE 1 TABLET BY MOUTH EVERY DAY, Disp: 90 tablet, Rfl: 1   calcium elemental as carbonate (BARIATRIC TUMS ULTRA) 400 MG chewable tablet, Chew by mouth., Disp: , Rfl:    carvedilol (COREG) 25 MG tablet, Take 1 tablet (25 mg total) by mouth 2 (two) times daily., Disp: 180 tablet, Rfl: 3   Cholecalciferol (VITAMIN D3) 2000 UNITS TABS, Take 1 tablet by mouth daily., Disp: , Rfl:    diphenoxylate-atropine (LOMOTIL) 2.5-0.025 MG tablet, Take 1 tablet by mouth 4 (four) times daily as needed for diarrhea or loose stools., Disp: 30 tablet, Rfl: 1   esomeprazole (NEXIUM) 20 MG capsule, Take 10 mg by mouth daily., Disp: , Rfl:    fluticasone (FLONASE) 50 MCG/ACT nasal spray, Place 2 sprays into both nostrils daily., Disp: , Rfl:    losartan (COZAAR) 100 MG tablet, Take 100 mg by mouth daily., Disp: , Rfl:    Multiple Vitamin (MULTIVITAMIN) tablet, Take 1 tablet by mouth daily., Disp: , Rfl:    Semaglutide,0.25 or 0.5MG/DOS, (OZEMPIC, 0.25 OR 0.5 MG/DOSE,) 2 MG/1.5ML SOPN, Inject 0.25 mg once weekly for 4 weeks, then increase to 0.5 mg weekly, Disp: 1.5 mL, Rfl: 0   sertraline (ZOLOFT) 100 MG tablet, TAKE 1 TABLET BY MOUTH EVERY DAY, Disp:  90 tablet, Rfl: 1   tolterodine (DETROL) 2 MG tablet, TAKE 1 TABLET TWICE A DAY, Disp: 180 tablet, Rfl: 3   triamcinolone (KENALOG) 0.1 %, Apply 1 application topically 2 (two) times daily., Disp: 30 g, Rfl: 0  Current Facility-Administered Medications:    diclofenac sodium (VOLTAREN) 1 % transdermal gel 4 g, 4 g, Topical, QID, Mohammed Kindle, MD   midazolam (VERSED) 5 MG/5ML injection 5 mg, 5 mg, Intravenous, Once, Mohammed Kindle, MD   orphenadrine (NORFLEX) injection 60 mg, 60 mg, Intramuscular, Once, Mohammed Kindle, MD   sodium  chloride 0.9 % injection 20 mL, 20 mL, Other, Once, Mohammed Kindle, MD   triamcinolone acetonide (KENALOG-40) injection 40 mg, 40 mg, Other, Once, Mohammed Kindle, MD  EXAM:  VITALS per patient if applicable:  GENERAL: alert, oriented, appears well and in no acute distress  HEENT: atraumatic, conjunttiva clear, no obvious abnormalities on inspection of external nose and ears  NECK: normal movements of the head and neck  LUNGS: on inspection no signs of respiratory distress, breathing rate appears normal, no obvious gross SOB, gasping or wheezing  CV: no obvious cyanosis  MS: moves all visible extremities without noticeable abnormality  PSYCH/NEURO: pleasant and cooperative, no obvious depression or anxiety, speech and thought processing grossly intact  ASSESSMENT AND PLAN:  Discussed the following assessment and plan:  Functional diarrhea - Plan: Ambulatory referral to Gastroenterology  Type 2 diabetes mellitus with hypertriglyceridemia ([place])  Diarrhea Trial of lomotil to manage the fecal incontinence and referral to GI for colonoscopy ddx includes small bowel overgrowth  Due to diabetes.   Type 2 diabetes mellitus with hypertriglyceridemia ([place]) .her diabetes is well controlled on Januvia,  But he has been unable to lose weight.  She has stopped [place] and tolerating Ozempic thus far. . Continue losartan .  She is intolerant of statins due to  myalgia.   Lab Results  Component Value Date   HGBA1C 6.1 08/02/2020   Lab Results  Component Value Date   MICROALBUR 1.5 04/02/2020   MICROALBUR 1.5 06/29/2019     I discussed the assessment and treatment plan with the patient. The patient was provided an opportunity to ask questions and all were answered. The patient agreed with the plan and demonstrated an understanding of the instructions.   The patient was advised to call back or seek an in-person evaluation if the symptoms worsen or if the condition fails to improve as anticipated.   I spent 30 minutes dedicated to the care of this patient on the date of this encounter to include pre-visit review of his medical history,  Face-to-face time with the patient , and post visit ordering of testing and therapeutics.    Crecencio Mc, MD

## 2020-10-10 DIAGNOSIS — R197 Diarrhea, unspecified: Secondary | ICD-10-CM | POA: Insufficient documentation

## 2020-10-10 NOTE — Assessment & Plan Note (Addendum)
.  her diabetes is well controlled on Januvia,  But he has been unable to lose weight.  She has stopped [place] and tolerating Ozempic thus far. . Continue losartan .  She is intolerant of statins due to myalgia.   Lab Results  Component Value Date   HGBA1C 6.1 08/02/2020   Lab Results  Component Value Date   MICROALBUR 1.5 04/02/2020   MICROALBUR 1.5 06/29/2019

## 2020-10-10 NOTE — Assessment & Plan Note (Signed)
Trial of lomotil to manage the fecal incontinence and referral to GI for colonoscopy ddx includes small bowel overgrowth  Due to diabetes.

## 2020-10-11 NOTE — Progress Notes (Signed)
Remote pacemaker transmission.   

## 2020-10-19 ENCOUNTER — Encounter: Payer: Self-pay | Admitting: Gastroenterology

## 2020-10-19 DIAGNOSIS — H2513 Age-related nuclear cataract, bilateral: Secondary | ICD-10-CM | POA: Diagnosis not present

## 2020-10-19 LAB — HM DIABETES EYE EXAM

## 2020-11-12 ENCOUNTER — Other Ambulatory Visit: Payer: Self-pay | Admitting: *Deleted

## 2020-11-12 NOTE — Progress Notes (Signed)
Please place future orders for lab appt.  

## 2020-11-13 ENCOUNTER — Other Ambulatory Visit (INDEPENDENT_AMBULATORY_CARE_PROVIDER_SITE_OTHER): Payer: HMO

## 2020-11-13 ENCOUNTER — Other Ambulatory Visit: Payer: Self-pay

## 2020-11-13 ENCOUNTER — Telehealth: Payer: Self-pay

## 2020-11-13 DIAGNOSIS — I10 Essential (primary) hypertension: Secondary | ICD-10-CM | POA: Diagnosis not present

## 2020-11-13 DIAGNOSIS — E781 Pure hyperglyceridemia: Secondary | ICD-10-CM

## 2020-11-13 DIAGNOSIS — E1169 Type 2 diabetes mellitus with other specified complication: Secondary | ICD-10-CM

## 2020-11-13 LAB — LDL CHOLESTEROL, DIRECT: Direct LDL: 132 mg/dL

## 2020-11-13 LAB — COMPREHENSIVE METABOLIC PANEL
ALT: 14 U/L (ref 0–35)
AST: 18 U/L (ref 0–37)
Albumin: 4.2 g/dL (ref 3.5–5.2)
Alkaline Phosphatase: 101 U/L (ref 39–117)
BUN: 14 mg/dL (ref 6–23)
CO2: 28 mEq/L (ref 19–32)
Calcium: 9.4 mg/dL (ref 8.4–10.5)
Chloride: 103 mEq/L (ref 96–112)
Creatinine, Ser: 0.74 mg/dL (ref 0.40–1.20)
GFR: 84.38 mL/min (ref >60.00)
Glucose, Bld: 98 mg/dL (ref 70–99)
Potassium: 3.7 mEq/L (ref 3.5–5.1)
Sodium: 142 mEq/L (ref 135–145)
Total Bilirubin: 0.4 mg/dL (ref 0.2–1.2)
Total Protein: 6.6 g/dL (ref 6.0–8.3)

## 2020-11-13 LAB — LIPID PANEL
Cholesterol: 216 mg/dL — ABNORMAL HIGH (ref 0–200)
HDL: 34.2 mg/dL — ABNORMAL LOW (ref >39.00)
NonHDL: 181.85
Total CHOL/HDL Ratio: 6
Triglycerides: 347 mg/dL — ABNORMAL HIGH (ref 0.0–149.0)
VLDL: 69.4 mg/dL — ABNORMAL HIGH (ref 0.0–40.0)

## 2020-11-13 LAB — HEMOGLOBIN A1C: Hgb A1c MFr Bld: 6.1 % (ref 4.6–6.5)

## 2020-11-13 NOTE — Telephone Encounter (Signed)
Lab orders placed for lab appt today.

## 2020-11-14 ENCOUNTER — Ambulatory Visit (INDEPENDENT_AMBULATORY_CARE_PROVIDER_SITE_OTHER): Payer: HMO | Admitting: Pharmacist

## 2020-11-14 DIAGNOSIS — E781 Pure hyperglyceridemia: Secondary | ICD-10-CM | POA: Diagnosis not present

## 2020-11-14 DIAGNOSIS — I429 Cardiomyopathy, unspecified: Secondary | ICD-10-CM

## 2020-11-14 DIAGNOSIS — F320 Major depressive disorder, single episode, mild: Secondary | ICD-10-CM | POA: Diagnosis not present

## 2020-11-14 DIAGNOSIS — K591 Functional diarrhea: Secondary | ICD-10-CM

## 2020-11-14 DIAGNOSIS — I10 Essential (primary) hypertension: Secondary | ICD-10-CM | POA: Diagnosis not present

## 2020-11-14 DIAGNOSIS — E1169 Type 2 diabetes mellitus with other specified complication: Secondary | ICD-10-CM

## 2020-11-14 NOTE — Patient Instructions (Signed)
Visit Information  PATIENT GOALS:  Goals Addressed               This Visit's Progress     Patient Stated     Medication Monitoring (pt-stated)        Patient Goals/Self-Care Activities Over the next 90 days, patient will:  - take medications as prescribed - collaborate with provider on medication access solutions.         Patient verbalizes understanding of instructions provided today and agrees to view in [place].   Plan: Telephone follow up appointment with care management team member scheduled for:  ~ 6 weeks  Catie Darnelle Maffucci, PharmD, [place], [place] Clinical Pharmacist [employer] [facility] [employer] [phone]

## 2020-11-14 NOTE — Chronic Care Management (AMB) (Signed)
Chronic Care Management Pharmacy Note  11/14/2020 Name:  Julia Pierce MRN:  [id number] DOB:  05-May-1955  Subjective: Julia Pierce is an 65 y.o. year old female who is a primary patient of Derrel Nip, Aris Everts, MD.  The CCM team was consulted for assistance with disease management and care coordination needs.    Engaged with patient by telephone for follow up visit in response to provider referral for pharmacy case management and/or care coordination services.   Consent to Services:  The patient was given information about Chronic Care Management services, agreed to services, and gave verbal consent prior to initiation of services.  Please see initial visit note for detailed documentation.   Patient Care Team: Crecencio Mc, MD as PCP - General (Internal Medicine) De Hollingshead, RPH-CPP (Pharmacist)  Recent office visits: 6/21- PCP visit for diarrhea. Provided script for Lomotil.  Objective:  Lab Results  Component Value Date   CREATININE 0.74 11/13/2020   CREATININE 0.76 08/02/2020   CREATININE 0.80 04/02/2020    Lab Results  Component Value Date   HGBA1C 6.1 11/13/2020   Last diabetic Eye exam:  Lab Results  Component Value Date/Time   HMDIABEYEEXA No Retinopathy 10/19/2020 12:00 AM    Last diabetic Foot exam:  Lab Results  Component Value Date/Time   HMDIABFOOTEX normaL 12/07/2013 12:00 AM        Component Value Date/Time   CHOL 216 (H) 11/13/2020 0911   TRIG 347.0 (H) 11/13/2020 0911   HDL 34.20 (L) 11/13/2020 0911   CHOLHDL 6 11/13/2020 0911   VLDL 69.4 (H) 11/13/2020 0911   LDLCALC 88 08/17/2013 0836   LDLDIRECT 132.0 11/13/2020 0911    Hepatic Function Latest Ref Rng & Units 11/13/2020 08/02/2020 04/02/2020  Total Protein 6.0 - 8.3 g/dL 6.6 7.0 6.7  Albumin 3.5 - 5.2 g/dL 4.2 4.0 4.2  AST 0 - 37 U/L _0 ALT 0 - 35 U/L _1 Alk Phosphatase 39 - 117 U/L 101 95 110  Total Bilirubin 0.2 - 1.2 mg/dL 0.4 0.4 0.4    Lab Results   Component Value Date/Time   TSH 3.80 12/12/2016 08:39 AM   TSH 3.11 05/09/2013 10:23 AM    CBC Latest Ref Rng & Units 09/11/2016 06/01/2013 05/31/2013  WBC 4.0 - 10.5 K/uL 8.0 9.6 7.8  Hemoglobin 12.0 - 15.0 g/dL 13.6 12.9 13.3  Hematocrit 36.0 - 46.0 % 40.8 37.4 38.3  Platelets 150.0 - 400.0 K/uL 257.0 204 201    Lab Results  Component Value Date/Time   VD25OH 43.82 12/12/2016 08:39 AM    Clinical ASCVD: No  The 10-year ASCVD risk score Mikey Bussing DC Jr., et al., 2013) is: 19.9%   Values used to calculate the score:     Age: 8 years     Sex: Female     Is Non-Hispanic African American: No     Diabetic: Yes     Tobacco smoker: No     Systolic Blood Pressure: 297 mmHg     Is BP treated: Yes     HDL Cholesterol: 34.2 mg/dL     Total Cholesterol: 216 mg/dL      Social History   Tobacco Use  Smoking Status Never  Smokeless Tobacco Never   BP Readings from Last 3 Encounters:  10/09/20 132/81  08/15/20 138/72  04/24/20 (!) 142/80   Pulse Readings from Last 3 Encounters:  08/15/20 91  04/24/20 70  04/04/20 78   Wt Readings from Last  3 Encounters:  10/09/20 163 lb (73.9 kg)  08/15/20 165 lb 12.8 oz (75.2 kg)  04/24/20 166 lb 3.2 oz (75.4 kg)    Assessment: Review of patient past medical history, allergies, medications, health status, including review of consultants reports, laboratory and other test data, was performed as part of comprehensive evaluation and provision of chronic care management services.   SDOH:  (Social Determinants of Health) assessments and interventions performed:  SDOH Interventions    Flowsheet Row Most Recent Value  SDOH Interventions   Financial Strain Interventions Intervention Not Indicated       CCM Care Plan  Allergies  Allergen Reactions   Cephalexin Anaphylaxis   Penicillin G Hives, Shortness Of Breath and Swelling   Penicillins Hives, Shortness Of Breath and Swelling   Atorvastatin     Other reaction(s): Muscle  Pain myalgias myalgias   Cymbalta [Duloxetine Hcl] Other (See Comments)   Duloxetine     Other reaction(s): Muscle Pain Other reaction(s): Muscle Pain   Fenofibrate     Muscle pain    Statins     Severe myalgias   Latex Rash    Medications Reviewed Today     Reviewed by De Hollingshead, RPH-CPP (Pharmacist) on 11/14/20 at [place] List Status: <None>   Medication Order Taking? Sig Documenting Provider Last Dose Status Informant  Accu-Chek FastClix Lancets MISC [id number]  TEST ONCE DAILY E11.9 Crecencio Mc, MD  Active   ACCU-CHEK GUIDE test strip [id number]  TEST ONCE DAILY E11.9 Crecencio Mc, MD  Active   amLODipine (NORVASC) 2.5 MG tablet [id number] Yes Take 1 tablet (2.5 mg total) by mouth daily. Crecencio Mc, MD Taking Active            Med Note Darnelle Maffucci, Arville Lime   Wed Nov 14, 2020  9:10 AM)    Azelaic Acid 15 % cream [id number] Yes APPLY TO AFFECTED AREA EVERY DAY [provider] Taking Active            Med Note Albin Felling, [place]   Wed Aug 15, 2020 11:11 AM) BID in AM & PM  B Complex Vitamins (B COMPLEX PO) [id number] Yes Take 1 tablet by mouth daily.  [provider] Taking Active Self  blood glucose meter kit and supplies [id number] Yes Dispense based on patient and insurance preference. Use to check blood sugar once daily. (FOR ICD-10 E11.9). Crecencio Mc, MD Taking Active   buPROPion (WELLBUTRIN XL) 150 MG 24 hr tablet [id number] Yes TAKE 1 TABLET BY MOUTH EVERY DAY Crecencio Mc, MD Taking Active   calcium elemental as carbonate (BARIATRIC TUMS ULTRA) 400 MG chewable tablet [id number] Yes Chew by mouth. [provider] Taking Active            Med Note Olena Heckle, MICHELE   Wed Apr 01, 2017  3:12 PM)    carvedilol (COREG) 25 MG tablet [id number] Yes Take 1 tablet (25 mg total) by mouth 2 (two) times daily. Evans Lance, MD Taking Active   Cholecalciferol (VITAMIN D3) 2000 UNITS TABS [id number] Yes Take 1 tablet by mouth daily. [provider] Taking Active Self  diclofenac sodium (VOLTAREN) 1 % transdermal gel 4 g [id number]   Mohammed Kindle, MD  Consider Medication Status and Discontinue (Completed Course)   diphenoxylate-atropine (LOMOTIL) 2.5-0.025 MG tablet [id number] Yes Take 1 tablet by mouth 4 (four) times daily as needed for diarrhea or loose stools. Crecencio Mc, MD Taking Active   esomeprazole (  NEXIUM) 20 MG capsule [id number] Yes Take 10 mg by mouth daily. [provider] Taking Active Self  fluticasone (FLONASE) 50 MCG/ACT nasal spray [id number] Yes Place 2 sprays into both nostrils daily. [provider] Taking Active            Med Note (NWOGU, IVY A   Mon Oct 08, 2020  9:02 AM) Taking PRN  losartan (COZAAR) 100 MG tablet [id number] Yes Take 100 mg by mouth daily. [provider] Taking Active            Med Note Darnelle Maffucci, Arville Lime   Wed Nov 14, 2020  9:10 AM)    midazolam (VERSED) 5 MG/5ML injection 5 mg [id number]   Mohammed Kindle, MD  Consider Medication Status and Discontinue (Completed Course)   Multiple Vitamin (MULTIVITAMIN) tablet [id number] Yes Take 1 tablet by mouth daily. [provider] Taking Active Self  orphenadrine (NORFLEX) injection 60 mg [id number]   Mohammed Kindle, MD  Consider Medication Status and Discontinue (Completed Course)   Semaglutide,0.25 or 0.5MG/DOS, (OZEMPIC, 0.25 OR 0.5 MG/DOSE,) 2 MG/1.5ML SOPN [id number] Yes Inject 0.25 mg once weekly for 4 weeks, then increase to 0.5 mg weekly Crecencio Mc, MD Taking Active   sertraline (ZOLOFT) 100 MG tablet [id number] Yes TAKE 1 TABLET BY MOUTH EVERY DAY Crecencio Mc, MD Taking Active   sodium chloride 0.9 % injection 20 mL [id number]   Mohammed Kindle, MD  Consider Medication Status and Discontinue (Completed Course)   tolterodine (DETROL) 2 MG tablet [id number] Yes TAKE 1 TABLET TWICE A DAY Crecencio Mc, MD Taking Active   triamcinolone (KENALOG) 0.1 % [id number]  Apply 1 application topically 2 (two)  times daily. Crecencio Mc, MD  Active   triamcinolone acetonide [facility]) injection 40 mg [id number]   Mohammed Kindle, MD  Consider Medication Status and Discontinue (Completed Course)   Med List Note Landis Martins, RN 12/31/15 1040): UDS 09-20-15 meds  Due 02-11-16 CS 11/30/2014            Patient Active Problem List   Diagnosis Date Noted   Diarrhea 10/10/2020   LBBB (left bundle branch block) 04/24/2020   Depression, major, single episode, mild ([place]) 08/27/2017   S/P total abdominal hysterectomy 06/12/2016   Bladder prolapse, female, acquired 06/12/2016   Bilateral occipital neuralgia 11/02/2014   Cervical facet syndrome 11/02/2014   Facet syndrome, lumbar 11/02/2014   Chronic pain syndrome 10/18/2014   DDD (degenerative disc disease), lumbar 10/03/2014   DDD (degenerative disc disease), cervical 10/03/2014   Gout of big toe 08/02/2014   ETD (eustachian tube dysfunction) 04/25/2014   History of shingles 12/07/2013   Biventricular cardiac pacemaker in situ 12/07/2013   Cervical stenosis of spine 05/11/2013   Erythema migrans (Lyme disease) 12/06/2012   Myalgia due to statin 03/03/2012   Reflux esophagitis 12/31/2011   Type 2 diabetes mellitus with hypertriglyceridemia ([place]) 09/27/2011   Obesity (BMI 30.0-34.9) 05/12/2011   Morton's neuroma    Cardiomyopathy ([place]) 03/31/2011   Long QT syndrome 02/05/2011   Hypertension     Immunization History  Administered Date(s) Administered   Fluad Quad(high Dose 65+) 01/31/2020   Influenza Split 03/28/2014   Influenza,inj,Quad PF,6+ Mos 02/07/2013, 04/11/2015, 03/06/2016, 03/18/2017, 03/24/2018, 02/04/2019   PFIZER(Purple Top)SARS-COV-2 Vaccination 07/29/2019, 08/23/2019   Pneumococcal Conjugate-13 06/28/2014   Pneumococcal Polysaccharide-23 05/11/2013, 04/04/2020   Tdap 02/08/2008, 03/24/2018    Conditions to be addressed/monitored: HTN, HLD, and DMII  Care Plan : Medication Management  Updates made by  De Hollingshead, RPH-CPP since 11/14/2020 12:00 AM     Problem: T2DM, Depression/Anxiety, Hyperlipidemia, Chronic Pain, Gout      Long-Range Goal: Disease Progression Prevention   This Visit's Progress: On track  Recent Progress: On track  Priority: High  Note:   Current Barriers:  Unable to independently afford treatment regimen Suboptimal therapeutic regimen for ASCVD risk reduction  Pharmacist Clinical Goal(s):  Over the next 90 days, patient will verbalize ability to afford treatment regimen through collaboration with PharmD and provider.   Interventions: 1:1 collaboration with Crecencio Mc, MD regarding development and update of comprehensive plan of care as evidenced by provider attestation and co-signature Inter-disciplinary care team collaboration (see longitudinal plan of care) Comprehensive medication review performed; medication list updated in electronic medical record  Health Maintenance: Will discuss Shingrix moving forward Due for DEXA. Discuss w/ PCP  Diabetes: Controlled; current treatment: Ozempic 0.5 mg weekly x 2 weeks Does report that once increasing Ozempic to 0.5 mg, she has near-constant queasiness. Has really limited dietary choices. Eating bagels and crackers. Diet also limited by diarrhea.  No hx metformin, though functional diarrhea at baseline Current glucose readings: 100-110s Diet: more complex carbs lately due to queasiness, desire to avoid diarrhea. Discussed typical side effects of GLP1. Discussed that we do not want her to only be choosing carbohydrates due to queasiness. Discussed reducing Ozempic dosing to a dose in between 0.25 mg and 0.5 mg. She will click back from 0.5 mg by 10 clicks (roughly in between 0.25 and 0.5 mg) and continue this dose weekly. Will outreach [place] if persistent queasiness impacting qualify of life and ability to make healthy choices.  Follow up with PCP tomorrow as schedule.d  Appt w/ GI 8/3  Hypertension, Cardiomyopathy;  s/p pacer placement: Controlled per last office visit; current treatment: amlodipine 2.5 mg daily, losartan 100 mg daily, carvedilol 25 mg BID Hx of metoprolol - hair loss Previously recommended to continue current treatment with home monitoring in collaboration with [name] (EP) and Dr. [name] (cardiology)  Hyperlipidemia: Uncontrolled; current treatment: none Medications previously tried: atorvastatin; reported myalgias. Ezetimibe, does not remember reason for discontinuation. Refuses to re-trial other statins due to a family history of statin associated myalgias (mother, father, brother).  Current dietary patterns: tries to eat lean meats, avoiding saturated fats. Already avoid corn oils d/t association with diarrhea Previously provided education on benefit of statins in DM and lipid lowering goals. Moving forward, consider PCSK9i for primary prevention due to statin intolerance. Patient not interested in additional medication at this time.  Depression/Anxiety Moderately well controlled; current treatment: sertraline 100 mg daily, bupropion XL 150 mg daily  Recommended to continue current regimen at this time  Overactive Bladder: Well controlled per patient report; current regimen: tolterodine LA 2 mg BID Previously recommended to continue current regimen at this time  Chronic Pain: Controlled per patient report with current use of methocarbamol 500 mg 2-3 times daily.  Hx tizanidine - oversedation.  Previously recommended to continue current regimen at this time. Will discuss alternative treatments for pain relief vs muscle relaxers moving forward.   Rosacea: Controlled per patient report;; current regimen; azelaic acid 1%, follows w/ Dr. Aubery Lapping.  Previously recommended to continue current regimen at this time.  GERD/IBS: Diarrhea uncontrolled; esomeprazole 20 mg daily PRN, lomotil PRN - reports usually needing 2 doses (4 hours apart) when she is having diarrhea. Reports  upcoming GI appointment on 8/3.  Encouraged to keep appointment for chronic diarrhea. Continue current  regimen at this time.  Allergies: Controlled per patient report; current regimen: fluticasone nasal spray BID PRN.  Previously recommended to continue current regimen at this time  Patient Goals/Self-Care Activities Over the next 90 days, patient will:  - take medications as prescribed collaborate with provider on medication access solutions  Follow Up Plan: Telephone follow up appointment with care management team member scheduled for:  ~6 weeks      Medication Assistance: None required.  Patient affirms current coverage meets needs.  Patient's preferred pharmacy is:  [id number] [address], [address] [place] [address] Phone: [phone] Fax: [phone]  [id number] [place]: [phone]: [phone]  Follow Up:  Patient agrees to Care Plan and Follow-up.  Plan: Telephone follow up appointment with care management team member scheduled for:  ~ 6 weeks  Catie TDarnelle Maffucci PharmD, BNorth [place] CNew OrleansClinical Pharmacist LOccidental Petroleumat BJohnson & [phone]

## 2020-11-15 ENCOUNTER — Encounter: Payer: Self-pay | Admitting: Internal Medicine

## 2020-11-15 ENCOUNTER — Ambulatory Visit (INDEPENDENT_AMBULATORY_CARE_PROVIDER_SITE_OTHER): Payer: HMO | Admitting: Internal Medicine

## 2020-11-15 ENCOUNTER — Other Ambulatory Visit: Payer: Self-pay

## 2020-11-15 VITALS — BP 110/64 | HR 81 | Temp 96.5°F | Resp 15 | Ht 62.0 in | Wt 160.0 lb

## 2020-11-15 DIAGNOSIS — E781 Pure hyperglyceridemia: Secondary | ICD-10-CM | POA: Diagnosis not present

## 2020-11-15 DIAGNOSIS — E1169 Type 2 diabetes mellitus with other specified complication: Secondary | ICD-10-CM

## 2020-11-15 DIAGNOSIS — F320 Major depressive disorder, single episode, mild: Secondary | ICD-10-CM | POA: Diagnosis not present

## 2020-11-15 DIAGNOSIS — Z780 Asymptomatic menopausal state: Secondary | ICD-10-CM

## 2020-11-15 DIAGNOSIS — M810 Age-related osteoporosis without current pathological fracture: Secondary | ICD-10-CM

## 2020-11-15 DIAGNOSIS — E669 Obesity, unspecified: Secondary | ICD-10-CM

## 2020-11-15 MED ORDER — ZOSTER VAC RECOMB ADJUVANTED 50 MCG/0.5ML IM SUSR: 0.5000 mL | Freq: Once | INTRAMUSCULAR | 1 refills | Status: AC

## 2020-11-15 NOTE — Progress Notes (Signed)
Subjective:  Patient ID: Julia Pierce, female    DOB: 09/15/1955  Age: 65 y.o. MRN: [id number]  CC: The primary encounter diagnosis was Postmenopausal estrogen deficiency. Diagnoses of Type 2 diabetes mellitus with hypertriglyceridemia ([place]), Obesity (BMI 30.0-34.9), Depression, major, single episode, mild ([place]), and Osteoporosis without current pathological fracture, unspecified osteoporosis type were also pertinent to this visit.  HPI Julia Pierce presents for diabetes follow up on type 2 diabetes   This visit occurred during the SARS-CoV-2 public health emergency.  Safety protocols were in place, including screening questions prior to the visit, additional usage of staff PPE, and extensive cleaning of exam room while observing appropriate contact time as indicated for disinfecting solutions.   Type 2 DM:  she is not   TOLERATING OZEMPIC  at the 0.5 mg dose that she started 2 weeks ago DUE TO PERSISTENT NAUSEA   which is aggravating her depression. Has lost 6 lbs since starting it. Was advised to reduce dose by ten clicks yesterday. during visit with Catie labs reviewed from yesterday's     Osteoporosis :  T scores -3.0 by mobile DEXA scan done in April.  Discsussed options of treatment .    Left 5th MT fracture May 28,  saw podiatry  , told it would take 10-12 weeks to heal,  no longer wearing the boot anymore bc it bothered her hip. Some swelling by end of day but no pain   Outpatient Medications Prior to Visit  Medication Sig Dispense Refill   Accu-Chek FastClix Lancets MISC TEST ONCE DAILY E11.9 102 each 1   ACCU-CHEK GUIDE test strip TEST ONCE DAILY E11.9 100 strip 1   amLODipine (NORVASC) 2.5 MG tablet Take 1 tablet (2.5 mg total) by mouth daily. 90 tablet 3   Azelaic Acid 15 % cream APPLY TO AFFECTED AREA EVERY DAY     B Complex Vitamins (B COMPLEX PO) Take 1 tablet by mouth daily.      blood glucose meter kit and supplies Dispense based on patient and insurance preference. Use to  check blood sugar once daily. (FOR ICD-10 E11.9). 1 each 0   buPROPion (WELLBUTRIN XL) 150 MG 24 hr tablet TAKE 1 TABLET BY MOUTH EVERY DAY 90 tablet 1   calcium elemental as carbonate (BARIATRIC TUMS ULTRA) 400 MG chewable tablet Chew by mouth.     carvedilol (COREG) 25 MG tablet Take 1 tablet (25 mg total) by mouth 2 (two) times daily. 180 tablet 3   Cholecalciferol (VITAMIN D3) 2000 UNITS TABS Take 1 tablet by mouth daily.     diphenoxylate-atropine (LOMOTIL) 2.5-0.025 MG tablet Take 1 tablet by mouth 4 (four) times daily as needed for diarrhea or loose stools. 30 tablet 1   esomeprazole (NEXIUM) 20 MG capsule Take 10 mg by mouth daily.     fluticasone (FLONASE) 50 MCG/ACT nasal spray Place 2 sprays into both nostrils daily.     losartan (COZAAR) 100 MG tablet Take 100 mg by mouth daily.     Multiple Vitamin (MULTIVITAMIN) tablet Take 1 tablet by mouth daily.     sertraline (ZOLOFT) 100 MG tablet TAKE 1 TABLET BY MOUTH EVERY DAY 90 tablet 1   tolterodine (DETROL) 2 MG tablet TAKE 1 TABLET TWICE A DAY 180 tablet 3   triamcinolone (KENALOG) 0.1 % Apply 1 application topically 2 (two) times daily. 30 g 0   Semaglutide,0.25 or 0.5MG/DOS, (OZEMPIC, 0.25 OR 0.5 MG/DOSE,) 2 MG/1.5ML SOPN Inject 0.25 mg once weekly for 4 weeks, then increase  to 0.5 mg weekly 1.5 mL 0   Facility-Administered Medications Prior to Visit  Medication Dose Route Frequency Provider Last Rate Last Admin   diclofenac sodium (VOLTAREN) 1 % transdermal gel 4 g  4 g Topical QID Mohammed Kindle, MD       midazolam (VERSED) 5 MG/5ML injection 5 mg  5 mg Intravenous Once Mohammed Kindle, MD       orphenadrine (NORFLEX) injection 60 mg  60 mg Intramuscular Once Mohammed Kindle, MD       sodium chloride 0.9 % injection 20 mL  20 mL Other Once Mohammed Kindle, MD       triamcinolone acetonide (KENALOG-40) injection 40 mg  40 mg Other Once Mohammed Kindle, MD        Review of Systems;  Patient denies headache, fevers, malaise,  unintentional weight loss, skin rash, eye pain, sinus congestion and sinus pain, sore throat, dysphagia,  hemoptysis , cough, dyspnea, wheezing, chest pain, palpitations, orthopnea, edema, abdominal pain, nausea, melena, diarrhea, constipation, flank pain, dysuria, hematuria, urinary  Frequency, nocturia, numbness, tingling, seizures,  Focal weakness, Loss of consciousness,  Tremor, insomnia, depression, anxiety, and suicidal ideation.      Objective:  BP 110/64 (BP Location: Left Arm, Patient Position: Sitting, Cuff Size: Normal)   Pulse 81   Temp (!) 96.5 F (35.8 C) (Temporal)   Resp 15   Ht _0  (1.575 m)   Wt 160 lb (72.6 kg)   SpO2 96%   BMI 29.26 kg/m   BP Readings from Last 3 Encounters:  11/15/20 110/64  10/09/20 132/81  08/15/20 138/72    Wt Readings from Last 3 Encounters:  11/15/20 160 lb (72.6 kg)  10/09/20 163 lb (73.9 kg)  08/15/20 165 lb 12.8 oz (75.2 kg)    General appearance: alert, cooperative and appears stated age Ears: normal TM's and external ear canals both ears Throat: lips, mucosa, and tongue normal; teeth and gums normal Neck: no adenopathy, no carotid bruit, supple, symmetrical, trachea midline and thyroid not enlarged, symmetric, no tenderness/mass/nodules Back: symmetric, no curvature. ROM normal. No CVA tenderness. Lungs: clear to auscultation bilaterally Heart: regular rate and rhythm, S1, S2 normal, no murmur, click, rub or gallop Abdomen: soft, non-tender; bowel sounds normal; no masses,  no organomegaly Pulses: 2+ and symmetric Skin: Skin color, texture, turgor normal. No rashes or lesions Lymph nodes: Cervical, supraclavicular, and axillary nodes normal.  Lab Results  Component Value Date   HGBA1C 6.1 11/13/2020   HGBA1C 6.1 08/02/2020   HGBA1C 6.3 04/02/2020    Lab Results  Component Value Date   CREATININE 0.74 11/13/2020   CREATININE 0.76 08/02/2020   CREATININE 0.80 04/02/2020    Lab Results  Component Value Date   WBC  8.0 09/11/2016   HGB 13.6 09/11/2016   HCT 40.8 09/11/2016   PLT 257.0 09/11/2016   GLUCOSE 98 11/13/2020   CHOL 216 (H) 11/13/2020   TRIG 347.0 (H) 11/13/2020   HDL 34.20 (L) 11/13/2020   LDLDIRECT 132.0 11/13/2020   LDLCALC 88 08/17/2013   ALT 14 11/13/2020   AST 18 11/13/2020   NA 142 11/13/2020   K 3.7 11/13/2020   CL 103 11/13/2020   CREATININE 0.74 11/13/2020   BUN 14 11/13/2020   CO2 28 11/13/2020   TSH 3.80 12/12/2016   HGBA1C 6.1 11/13/2020   MICROALBUR 1.5 04/02/2020    DG MOBILE BONE DENSITY  Result Date: 08/06/2020 CLINICAL DATA:  Postmenopausal. History of L5-S1 posterior lumbar spine fusion. EXAM: DUAL X-RAY  ABSORPTIOMETRY (DXA) FOR BONE MINERAL DENSITY TECHNIQUE: Bone mineral density measurements are performed of the spine, hip, and forearm, as appropriate, per International Society of Clinical Densitometry recommendations. The pertinent regions of interest are reported below. Non-contributory values are not reported. Images are obtained for bone mineral density measurement and are not obtained for diagnostic purposes. FINDINGS: AP LUMBAR SPINE L1 through L3 Bone Mineral Density (BMD):  1.009 g/cm2 Young Adult T-Score:  -0.1 Z-Score:  1.7 LEFT FEMUR NECK Bone Mineral Density (BMD):  0.516 g/cm2 Young Adult T-Score: -3.0 Z-Score:  -1.4 LEFT FOREARM (1/3 RADIUS) Bone Mineral Density (BMD):  0.452 Young Adult T Score:  -4.0 Z Score:  -2.3 Unit: This study was performed at [facility] on the [place] (S/N [phone]), software version 13.4.2. Scan quality: The scan quality is good. Exclusions: L5 excluded due to surgical hardware. ASSESSMENT: Patient's diagnostic category is OSTEOPOROSIS by WHO Criteria. FRACTURE RISK: INCREASED FRAX: World Health Organization FRAX assessment of absolute fracture risk is not calculated for this patient because the patient has osteoporosis. COMPARISON: None. RECOMMENDATIONS 1. All patients should optimize calcium and vitamin D intake.  2. Consider FDA-approved medical therapies in postmenopausal women and men aged 51 years and older, based on the following: - A hip or vertebral (clinical or morphometric) fracture - T-score less than or equal to -2.5 at the femoral neck or spine after appropriate evaluation to exclude secondary causes - Low bone mass (T-score between -1.0 and -2.5 at the femoral neck or spine) and a 10-year probability of a hip fracture greater than or equal to 3% or a 10-year probability of a major osteoporosis-related fracture greater than or equal to 20% based on the [place]-adapted WHO algorithm - Clinician judgment and/or patient preferences may indicate treatment for people with 10-year fracture probabilities above or below these levels 3. Patients with diagnosis of osteoporosis or at high risk for fracture should have regular bone mineral density tests. For patients eligible for Medicare, routine testing is allowed once every 2 years. The testing frequency can be increased to one year for patients who have rapidly progressing disease, those who are receiving or discontinuing medical therapy to restore bone mass, or have additional risk factors. Electronically Signed   By: Lajean Manes M.D.   On: 08/06/2020 14:31    Assessment & Plan:   Problem List Items Addressed This Visit       Unprioritized   Obesity (BMI 30.0-34.9)    Weight is improving with use of Ozempic        Type 2 diabetes mellitus with hypertriglyceridemia (HCC)    Excellent control on current regimen,  But she will need to reduce her ozempic dose due to persistent nausea  Lab Results  Component Value Date   HGBA1C 6.1 11/13/2020     Lab Results  Component Value Date   MICROALBUR 1.5 04/02/2020   MICROALBUR 1.5 06/29/2019           Relevant Orders   Hemoglobin A1c   Lipid panel   Comprehensive metabolic panel   Depression, major, single episode, mild ([place])    Tolerating wellbutrin to address concentration and fatigue has improved  her symptoms.  Continue medication along with  sertraline for the anxiety        Osteoporosis    Discussed the various treatment options,  Along with and Vit d requirements. She is contemplative of therapy        Other Visit Diagnoses     Postmenopausal estrogen deficiency    -  Primary      Meds ordered this encounter  Medications   Zoster Vaccine Adjuvanted [facility]) injection    Sig: Inject 0.5 mLs into the muscle once for 1 dose.    Dispense:  1 each    Refill:  1    I provided  30 minutes of  face-to-face time during this encounter reviewing patient's current recent DEXA scan and discussing treatment options for osteoporosis,  reviewing her glycemic control ,  labs and imaging studies,  , and coordination  of care .    Follow-up: Return in about 3 months (around 02/15/2021) for follow up diabetes.   Crecencio Mc, MD

## 2020-11-15 NOTE — Patient Instructions (Addendum)
Your diabetes is under excellent control  If you need to reduce the ozempic back to 0.25 mg to tolerate the dose,  that is fine .  We will continue it at the lowest dose ONLY IF IT IS HELPING YOU TO LOSE WEIGHT   We discussed osteoporosis management   Please give thought to what type of medication you would like to start

## 2020-11-16 ENCOUNTER — Other Ambulatory Visit: Payer: Self-pay

## 2020-11-16 DIAGNOSIS — E669 Obesity, unspecified: Secondary | ICD-10-CM

## 2020-11-16 DIAGNOSIS — E781 Pure hyperglyceridemia: Secondary | ICD-10-CM

## 2020-11-16 DIAGNOSIS — E1169 Type 2 diabetes mellitus with other specified complication: Secondary | ICD-10-CM

## 2020-11-16 MED ORDER — OZEMPIC (0.25 OR 0.5 MG/DOSE) 2 MG/1.5ML ~~LOC~~ SOPN: PEN_INJECTOR | SUBCUTANEOUS | 0 refills | Status: DC

## 2020-11-16 NOTE — Assessment & Plan Note (Signed)
Weight is improving with use of Ozempic

## 2020-11-17 DIAGNOSIS — M810 Age-related osteoporosis without current pathological fracture: Secondary | ICD-10-CM | POA: Insufficient documentation

## 2020-11-17 NOTE — Assessment & Plan Note (Signed)
Discussed the various treatment options,  Along with and Vit d requirements. She is contemplative of therapy

## 2020-11-17 NOTE — Assessment & Plan Note (Signed)
Excellent control on current regimen,  But she will need to reduce her ozempic dose due to persistent nausea  Lab Results  Component Value Date   HGBA1C 6.1 11/13/2020     Lab Results  Component Value Date   MICROALBUR 1.5 04/02/2020   MICROALBUR 1.5 06/29/2019

## 2020-11-17 NOTE — Assessment & Plan Note (Signed)
Tolerating wellbutrin to address concentration and fatigue has improved her symptoms.  Continue medication along with  sertraline for the anxiety

## 2020-11-20 ENCOUNTER — Telehealth: Payer: HMO | Admitting: Physician Assistant

## 2020-11-20 ENCOUNTER — Telehealth: Payer: Self-pay

## 2020-11-20 DIAGNOSIS — R112 Nausea with vomiting, unspecified: Secondary | ICD-10-CM | POA: Diagnosis not present

## 2020-11-20 NOTE — Telephone Encounter (Signed)
R speaking with access nurse and patient she stated she will do a virtual appointment on mychart.

## 2020-11-20 NOTE — Patient Instructions (Signed)
Julia Pierce, thank you for joining William Cody Martin, PA-C for today's virtual visit.  While this provider is not your primary care provider (PCP), if your PCP is located in our provider database this encounter information will be shared with them immediately following your visit.  Consent: (Patient) Julia Pierce provided verbal consent for this virtual visit at the beginning of the encounter.  Current Medications:  Current Outpatient Medications:    Accu-Chek FastClix Lancets MISC, TEST ONCE DAILY E11.9, Disp: 102 each, Rfl: 1   ACCU-CHEK GUIDE test strip, TEST ONCE DAILY E11.9, Disp: 100 strip, Rfl: 1   amLODipine (NORVASC) 2.5 MG tablet, Take 1 tablet (2.5 mg total) by mouth daily., Disp: 90 tablet, Rfl: 3   Azelaic Acid 15 % cream, APPLY TO AFFECTED AREA EVERY DAY, Disp: , Rfl:    B Complex Vitamins (B COMPLEX PO), Take 1 tablet by mouth daily. , Disp: , Rfl:    blood glucose meter kit and supplies, Dispense based on patient and insurance preference. Use to check blood sugar once daily. (FOR ICD-10 E11.9)., Disp: 1 each, Rfl: 0   buPROPion (WELLBUTRIN XL) 150 MG 24 hr tablet, TAKE 1 TABLET BY MOUTH EVERY DAY, Disp: 90 tablet, Rfl: 1   calcium elemental as carbonate (BARIATRIC TUMS ULTRA) 400 MG chewable tablet, Chew by mouth., Disp: , Rfl:    carvedilol (COREG) 25 MG tablet, Take 1 tablet (25 mg total) by mouth 2 (two) times daily., Disp: 180 tablet, Rfl: 3   Cholecalciferol (VITAMIN D3) 2000 UNITS TABS, Take 1 tablet by mouth daily., Disp: , Rfl:    diphenoxylate-atropine (LOMOTIL) 2.5-0.025 MG tablet, Take 1 tablet by mouth 4 (four) times daily as needed for diarrhea or loose stools., Disp: 30 tablet, Rfl: 1   esomeprazole (NEXIUM) 20 MG capsule, Take 10 mg by mouth daily., Disp: , Rfl:    fluticasone (FLONASE) 50 MCG/ACT nasal spray, Place 2 sprays into both nostrils daily., Disp: , Rfl:    losartan (COZAAR) 100 MG tablet, Take 100 mg by mouth daily., Disp: , Rfl:    Multiple  Vitamin (MULTIVITAMIN) tablet, Take 1 tablet by mouth daily., Disp: , Rfl:    Semaglutide,0.25 or 0.5MG/DOS, (OZEMPIC, 0.25 OR 0.5 MG/DOSE,) 2 MG/1.5ML SOPN, Inject 0.25 mg once weekly for 4 weeks, then increase to 0.5 mg weekly, Disp: 1.5 mL, Rfl: 0   sertraline (ZOLOFT) 100 MG tablet, TAKE 1 TABLET BY MOUTH EVERY DAY, Disp: 90 tablet, Rfl: 1   tolterodine (DETROL) 2 MG tablet, TAKE 1 TABLET TWICE A DAY, Disp: 180 tablet, Rfl: 3   triamcinolone (KENALOG) 0.1 %, Apply 1 application topically 2 (two) times daily., Disp: 30 g, Rfl: 0  Current Facility-Administered Medications:    diclofenac sodium (VOLTAREN) 1 % transdermal gel 4 g, 4 g, Topical, QID, Crisp, Gregory, MD   midazolam (VERSED) 5 MG/5ML injection 5 mg, 5 mg, Intravenous, Once, Crisp, Gregory, MD   orphenadrine (NORFLEX) injection 60 mg, 60 mg, Intramuscular, Once, Crisp, Gregory, MD   sodium chloride 0.9 % injection 20 mL, 20 mL, Other, Once, Crisp, Gregory, MD   triamcinolone acetonide (KENALOG-40) injection 40 mg, 40 mg, Other, Once, Crisp, Gregory, MD   Medications ordered in this encounter:  No orders of the defined types were placed in this encounter.    *If you need refills on other medications prior to your next appointment, please contact your pharmacy*  Follow-Up: Call back or seek an in-person evaluation if the symptoms worsen or if the condition fails to   improve as anticipated.  Other Instructions Keep well-hydrated and try to rest. I want you to start the ginger supplement as directed, along with OTC Pepto Bismol (they have in tablets now).  Continue your B complex vitamin. Follow the dietary recommendations below. Follow-up with your GI provider tomorrow as scheduled. If vomiting recurs or if you are unable to keep fluids in the system, you need to be evaluated at nearest ER.   Bland Diet A bland diet consists of foods that are often soft and do not have a lot of fat, fiber, or extra seasonings. Foods without  fat, fiber, or seasoning are easier for the body to digest. They are also less likely to irritate your mouth, throat, stomach, and other parts of your digestive system. A bland dietis sometimes called a BRAT diet. What is my plan? Your health care provider or food and nutrition specialist (dietitian) may recommend specific changes to your diet to prevent symptoms or to treat your symptoms. These changes may include: Eating small meals often. Cooking food until it is soft enough to chew easily. Chewing your food well. Drinking fluids slowly. Not eating foods that are very spicy, sour, or fatty. Not eating citrus fruits, such as oranges and grapefruit. What do I need to know about this diet? Eat a variety of foods from the bland diet food list. Do not follow a bland diet longer than needed. Ask your health care provider whether you should take vitamins or supplements. What foods can I eat? Grains  Hot cereals, such as cream of wheat. Rice. Bread, crackers, or tortillas madefrom refined white flour. Vegetables Canned or cooked vegetables. Mashed or boiled potatoes. Fruits  Bananas. Applesauce. Other types of cooked or canned fruit with the skin andseeds removed, such as canned peaches or pears. Meats and other proteins  Scrambled eggs. Creamy peanut butter or other nut butters. Lean, well-cookedmeats, such as chicken or fish. Tofu. Soups or broths. Dairy Low-fat dairy products, such as milk, cottage cheese, or yogurt. Beverages  Water. Herbal tea. Apple juice. Fats and oils Mild salad dressings. Canola or olive oil. Sweets and desserts Pudding. Custard. Fruit gelatin. Ice cream. The items listed above may not be a complete list of recommended foods and beverages. Contact a dietitian for more options. What foods are not recommended? Grains Whole grain breads and cereals. Vegetables Raw vegetables. Fruits Raw fruits, especially citrus, berries, or dried fruits. Dairy Whole fat  dairy foods. Beverages Caffeinated drinks. Alcohol. Seasonings and condiments Strongly flavored seasonings or condiments. Hot sauce. Salsa. Other foods Spicy foods. Fried foods. Sour foods, such as pickled or fermented foods. Foodswith high sugar content. Foods high in fiber. The items listed above may not be a complete list of foods and beverages to avoid. Contact a dietitian for more information. Summary A bland diet consists of foods that are often soft and do not have a lot of fat, fiber, or extra seasonings. Foods without fat, fiber, or seasoning are easier for the body to digest. Check with your health care provider to see how long you should follow this diet plan. It is not meant to be followed for long periods. This information is not intended to replace advice given to you by your health care provider. Make sure you discuss any questions you have with your healthcare provider. Document Revised: 05/06/2017 Document Reviewed: 05/06/2017 Elsevier Patient Education  2022 Elsevier Inc.    If you have been instructed to have an in-person evaluation today at a local Urgent Care   facility, please use the link below. It will take you to a list of all of our available [facility], including address, phone number and hours of operation. Please do not delay care.  [facility]  If you or a family member do not have a primary care provider, use the link below to schedule a visit and establish care. When you choose a [facility] primary care physician or advanced practice provider, you gain a long-term partner in health. Find a Primary Care Provider  Learn more about [facility]'s in-office and virtual care options: [facility]

## 2020-11-20 NOTE — Progress Notes (Signed)
Virtual Visit Consent   Keighley Deckman, you are scheduled for a virtual visit with a [facility] provider today.     Just as with appointments in the office, your consent must be obtained to participate.  Your consent will be active for this visit and any virtual visit you may have with one of our providers in the next 365 days.     If you have a MyChart account, a copy of this consent can be sent to you electronically.  All virtual visits are billed to your insurance company just like a traditional visit in the office.    As this is a virtual visit, video technology does not allow for your provider to perform a traditional examination.  This may limit your provider's ability to fully assess your condition.  If your provider identifies any concerns that need to be evaluated in person or the need to arrange testing (such as labs, EKG, etc.), we will make arrangements to do so.     Although advances in technology are sophisticated, we cannot ensure that it will always work on either your end or our end.  If the connection with a video visit is poor, the visit may have to be switched to a telephone visit.  With either a video or telephone visit, we are not always able to ensure that we have a secure connection.     I need to obtain your verbal consent now.   Are you willing to proceed with your visit today?    Yanett Conkright has provided verbal consent on 11/20/2020 for a virtual visit (video or telephone).   Leeanne Rio, [place]   Date: 11/20/2020 12:10 PM   Virtual Visit via Video Note   I, Leeanne Rio, connected with  Adalind Weitz  ([id number], 1956-04-13) on 11/20/20 at 11:45 AM EDT by a video-enabled telemedicine application and verified that I am speaking with the correct person using two identifiers.  Location: Patient: Virtual Visit Location Patient: Home Provider: Virtual Visit Location Provider: Home Office   I discussed the limitations of evaluation and management by  telemedicine and the availability of in person appointments. The patient expressed understanding and agreed to proceed.    History of Present Illness: Iysis Germain is a 65 y.o. who identifies as a female who was assigned female at birth, and is being seen today for nausea and vomiting starting this past weekend. Notes Sunday was the worst with several episodes of non-bloody emesis and continual nausea. Vomiting has stopped since last night, with very infrequent episodes yesterday. Has been able to keep PO fluids in but cannot drink anything other than water without feeling nauseated. Was able to get some crackers on her stomach as of this morning. Of note she has appointment with Gastroenterology tomorrow morning (Dr. Loletha Carrow) due to some ongoing functional diarrhea and GI issues. Notes last Bm yesterday. No melena, hematochezia or tenesmus. Denies abdominal pain, chest pain, shortness of breath, lightheadedness or dizziness. Denies any recent travel, sick contact or abnormal food or water source. She has recently been switched to [place] and notes with last couple of doses (switching to 0.5) she has had more GI issues. Has not taken her last dose as she was out of medication (due to be taken yesterday).   HPI: HPI  Problems:  Patient Active Problem List   Diagnosis Date Noted   Osteoporosis 11/17/2020   LBBB (left bundle branch block) 04/24/2020   Depression, major, single episode, mild ([place]) 08/27/2017  S/P total abdominal hysterectomy 06/12/2016   Bladder prolapse, female, acquired 06/12/2016   Bilateral occipital neuralgia 11/02/2014   Cervical facet syndrome 11/02/2014   Facet syndrome, lumbar 11/02/2014   Chronic pain syndrome 10/18/2014   DDD (degenerative disc disease), lumbar 10/03/2014   DDD (degenerative disc disease), cervical 10/03/2014   Gout of big toe 08/02/2014   History of shingles 12/07/2013   Biventricular cardiac pacemaker in situ 12/07/2013   Cervical stenosis of spine  05/11/2013   Erythema migrans (Lyme disease) 12/06/2012   Myalgia due to statin 03/03/2012   Reflux esophagitis 12/31/2011   Type 2 diabetes mellitus with hypertriglyceridemia ([place]) 09/27/2011   Obesity (BMI 30.0-34.9) 05/12/2011   Morton's neuroma    Cardiomyopathy ([place]) 03/31/2011   Long QT syndrome 02/05/2011   Hypertension     Allergies:  Allergies  Allergen Reactions   Cephalexin Anaphylaxis   Penicillin G Hives, Shortness Of Breath and Swelling   Penicillins Hives, Shortness Of Breath and Swelling   Atorvastatin     Other reaction(s): Muscle Pain myalgias myalgias   Cymbalta [Duloxetine Hcl] Other (See Comments)   Duloxetine     Other reaction(s): Muscle Pain Other reaction(s): Muscle Pain   Fenofibrate     Muscle pain    Statins     Severe myalgias   Latex Rash   Medications:  Current Outpatient Medications:    Accu-Chek FastClix Lancets MISC, TEST ONCE DAILY E11.9, Disp: 102 each, Rfl: 1   ACCU-CHEK GUIDE test strip, TEST ONCE DAILY E11.9, Disp: 100 strip, Rfl: 1   amLODipine (NORVASC) 2.5 MG tablet, Take 1 tablet (2.5 mg total) by mouth daily., Disp: 90 tablet, Rfl: 3   Azelaic Acid 15 % cream, APPLY TO AFFECTED AREA EVERY DAY, Disp: , Rfl:    B Complex Vitamins (B COMPLEX PO), Take 1 tablet by mouth daily. , Disp: , Rfl:    blood glucose meter kit and supplies, Dispense based on patient and insurance preference. Use to check blood sugar once daily. (FOR ICD-10 E11.9)., Disp: 1 each, Rfl: 0   buPROPion (WELLBUTRIN XL) 150 MG 24 hr tablet, TAKE 1 TABLET BY MOUTH EVERY DAY, Disp: 90 tablet, Rfl: 1   calcium elemental as carbonate (BARIATRIC TUMS ULTRA) 400 MG chewable tablet, Chew by mouth., Disp: , Rfl:    carvedilol (COREG) 25 MG tablet, Take 1 tablet (25 mg total) by mouth 2 (two) times daily., Disp: 180 tablet, Rfl: 3   Cholecalciferol (VITAMIN D3) 2000 UNITS TABS, Take 1 tablet by mouth daily., Disp: , Rfl:    diphenoxylate-atropine (LOMOTIL) 2.5-0.025 MG  tablet, Take 1 tablet by mouth 4 (four) times daily as needed for diarrhea or loose stools., Disp: 30 tablet, Rfl: 1   esomeprazole (NEXIUM) 20 MG capsule, Take 10 mg by mouth daily., Disp: , Rfl:    fluticasone (FLONASE) 50 MCG/ACT nasal spray, Place 2 sprays into both nostrils daily., Disp: , Rfl:    losartan (COZAAR) 100 MG tablet, Take 100 mg by mouth daily., Disp: , Rfl:    Multiple Vitamin (MULTIVITAMIN) tablet, Take 1 tablet by mouth daily., Disp: , Rfl:    Semaglutide,0.25 or 0.5MG/DOS, (OZEMPIC, 0.25 OR 0.5 MG/DOSE,) 2 MG/1.5ML SOPN, Inject 0.25 mg once weekly for 4 weeks, then increase to 0.5 mg weekly, Disp: 1.5 mL, Rfl: 0   sertraline (ZOLOFT) 100 MG tablet, TAKE 1 TABLET BY MOUTH EVERY DAY, Disp: 90 tablet, Rfl: 1   tolterodine (DETROL) 2 MG tablet, TAKE 1 TABLET TWICE A DAY, Disp: 180  tablet, Rfl: 3   triamcinolone (KENALOG) 0.1 %, Apply 1 application topically 2 (two) times daily., Disp: 30 g, Rfl: 0  Current Facility-Administered Medications:    diclofenac sodium (VOLTAREN) 1 % transdermal gel 4 g, 4 g, Topical, QID, Mohammed Kindle, MD   midazolam (VERSED) 5 MG/5ML injection 5 mg, 5 mg, Intravenous, Once, Mohammed Kindle, MD   orphenadrine (NORFLEX) injection 60 mg, 60 mg, Intramuscular, Once, Mohammed Kindle, MD   sodium chloride 0.9 % injection 20 mL, 20 mL, Other, Once, Mohammed Kindle, MD   triamcinolone acetonide (KENALOG-40) injection 40 mg, 40 mg, Other, Once, Mohammed Kindle, MD  Observations/Objective: Patient is well-developed, well-nourished in no acute distress.  Resting comfortably at home.  Head is normocephalic, atraumatic.  No labored breathing. Speech is clear and coherent with logical content.  Patient is alert and oriented at baseline.   Assessment and Plan: 1. Non-intractable vomiting with nausea, unspecified vomiting type History of Cardiomyopathy and QT prolongation, s/p biventricular pacemaker without ICD. Is now hydrating well overall and starting to  reintroduce solid foods. Vomiting has subsided for > 12 hours. Lingering issue is significant nausea. Giving history, even with pacemaker, want to avoid QT prolonging agents like Zofran and promethazine. Attempted to reach her cardiologist without success. As such, will have her continue her b complex vitamin and start OTC ginger supplement and pepto bismol until her evaluation with GI tomorrow. BRAT diet reviewed. Handout given. Strict ER precautions reviewed with patient.   Follow Up Instructions: I discussed the assessment and treatment plan with the patient. The patient was provided an opportunity to ask questions and all were answered. The patient agreed with the plan and demonstrated an understanding of the instructions.  A copy of instructions were sent to the patient via MyChart.  The patient was advised to call back or seek an in-person evaluation if the symptoms worsen or if the condition fails to improve as anticipated.  Time:  I spent 20 minutes with the patient via telehealth technology discussing the above problems/concerns.    Leeanne Rio, PA-C

## 2020-11-20 NOTE — Telephone Encounter (Signed)
Pt called and states that she has had vomiting and diarrhea since Saturday. She took a Covid test on Sunday and it was negative. She states that she has an appt with GI tomorrow but does not think that she can make it out of her driveway. She states that she is getting very dehydrated. I transferred her to Ria Comment at [facility] to triage and let her know that we do not have any appts avail in office or virtually today or tomorrow.

## 2020-11-21 ENCOUNTER — Ambulatory Visit: Payer: HMO | Admitting: Gastroenterology

## 2020-11-21 ENCOUNTER — Encounter: Payer: Self-pay | Admitting: Gastroenterology

## 2020-11-21 VITALS — BP 110/72 | HR 88 | Ht 62.5 in | Wt 156.0 lb

## 2020-11-21 DIAGNOSIS — K529 Noninfective gastroenteritis and colitis, unspecified: Secondary | ICD-10-CM | POA: Diagnosis not present

## 2020-11-21 MED ORDER — PLENVU 140 G PO SOLR: 140.0000 g | ORAL | 0 refills | Status: DC

## 2020-11-21 NOTE — Progress Notes (Signed)
[name]:  History: Julia Pierce 11/21/2020  Referring provider: Crecencio Mc, MD  Reason for consult/chief complaint: Diarrhea (Onset yrs, worsening, using Lomotil prn) and Colonoscopy (Last in 2012)   Subjective  HPI:  Julia Pierce is a very pleasant 65 year old woman referred by primary care for chronic diarrhea.  She reports that it has been occurring for about 20 years, is intermittent and sometimes with urgency or other certain food triggers.  It sounds like for at least the last year the problem has been getting somewhat worse, with episodes that are more frequent with bothersome urgency.  She denies abdominal pain or rectal bleeding.  She has noticed certain food triggers, particularly if eating at restaurants or at a friend's house with think she is not used to. She has also had nausea in the last few months with her Ozempic injections, more so when the dose was recently increased.  She does not yet know if that medicine will be continued since there was also an increase in its cost lately. Last colonoscopy in 2012 at [facility].  No polyps found   ROS:  Review of Systems  Constitutional:  Negative for appetite change and unexpected weight change.  HENT:  Negative for mouth sores and voice change.   Eyes:  Negative for pain and redness.  Respiratory:  Negative for cough and shortness of breath.   Cardiovascular:  Negative for chest pain and palpitations.  Genitourinary:  Negative for dysuria and hematuria.  Musculoskeletal:  Positive for arthralgias. Negative for myalgias.  Skin:  Negative for pallor and rash.  Neurological:  Negative for weakness and headaches.  Hematological:  Negative for adenopathy.  Psychiatric/Behavioral:         Anxiety    Past Medical History: Past Medical History:  Diagnosis Date   Anxiety    Arthritis    Cardiac arrhythmia due to congenital heart disease    Chicken pox    Chronic systolic heart  failure ([place]) 09/13/2013   EF now 55%  Bu June 2019   ECHO   Depression    Diabetes ([place])    Fibromyalgia    Headache, frequent episodic tension-type    High cholesterol    History of high blood pressure    readings   Holter monitor, abnormal 11/2010   done for long QT.  some contractile asynchrony [name])   Hx of colonoscopy 12/2010   normal,  next due 2022, [name]   Hyperlipidemia    Hypertension    Morton's neuroma    bilateral   Pacemaker    3 wire w/o difibrulator   Syncope    Last cardiology appt 07/12/20 note reviewed - last echo from 10/04/20 in Care everywhere (see below)  Past Surgical History: Past Surgical History:  Procedure Laterality Date   ABDOMINAL HYSTERECTOMY  Dec 2013   [place] [facility]   BI-VENTRICULAR PACEMAKER INSERTION (CRT-P)  05/2013   MDT CRTP implanted by Dr Lovena Le for cardiomyopathy, LBBB, and syncope   HYSTEROSCOPY  2005   heavy bleeding   IMPLANTABLE CARDIOVERTER DEFIBRILLATOR IMPLANT N/A 06/02/2013   Procedure: IMPLANTABLE CARDIOVERTER DEFIBRILLATOR IMPLANT;  Surgeon: Evans Lance, MD;  Location: [facility] CATH LAB;  Service: Cardiovascular;  Laterality: N/A;   SPINAL FUSION  03/2007   ruptured disc  L5  [name]   TONSILLECTOMY AND ADENOIDECTOMY  1966     Family History: Family History  Problem Relation Age of Onset   Arthritis Mother  Hyperlipidemia Mother    Cirrhosis Mother    Arthritis Father    Cancer Father        prostate cancer    Hyperlipidemia Father    Pulmonary embolism Father 29       blood clots   Alzheimer's disease Father    Diabetes Brother    Coronary artery disease Paternal Grandfather 58   Diabetes Maternal Uncle    Cancer Other        ovarian,uterus   Heart disease Other    Stroke Other    Learning disabilities Other     Social History: Social History   Socioeconomic History   Marital status: Married    Spouse name: Not on file   Number of children: Not on file   Years of education:  Not on file   Highest education level: Not on file  Occupational History   Not on file  Tobacco Use   Smoking status: Never   Smokeless tobacco: Never  Vaping Use   Vaping Use: Never used  Substance and Sexual Activity   Alcohol use: No   Drug use: No   Sexual activity: Yes  Other Topics Concern   Not on file  Social History Narrative   Not on file   Social Determinants of Health   Financial Resource Strain: Low Risk    Difficulty of Paying Living Expenses: Not very hard  Food Insecurity: No Food Insecurity   Worried About Running Out of Food in the Last Year: Never true   Ran Out of Food in the Last Year: Never true  Transportation Needs: No Transportation Needs   Lack of Transportation (Medical): No   Lack of Transportation (Non-Medical): No  Physical Activity: Not on file  Stress: Stress Concern Present   Feeling of Stress : To some extent  Social Connections: Unknown   Frequency of Communication with Friends and Family: Not on file   Frequency of Social Gatherings with Friends and Family: Not on file   Attends Religious Services: Not on file   Active Member of Clubs or Organizations: Not on file   Attends Archivist Meetings: Not on file   Marital Status: Married    Allergies: Allergies  Allergen Reactions   Cephalexin Anaphylaxis   Penicillin G Hives, Shortness Of Breath and Swelling   Penicillins Hives, Shortness Of Breath and Swelling   Atorvastatin     Other reaction(s): Muscle Pain myalgias myalgias   Cymbalta [Duloxetine Hcl] Other (See Comments)   Duloxetine     Other reaction(s): Muscle Pain Other reaction(s): Muscle Pain   Fenofibrate     Muscle pain    Statins     Severe myalgias   Latex Rash    Outpatient Meds: Current Outpatient Medications  Medication Sig Dispense Refill   Accu-Chek FastClix Lancets MISC TEST ONCE DAILY E11.9 102 each 1   ACCU-CHEK GUIDE test strip TEST ONCE DAILY E11.9 100 strip 1   amLODipine (NORVASC) 2.5  MG tablet Take 1 tablet (2.5 mg total) by mouth daily. 90 tablet 3   Azelaic Acid 15 % cream APPLY TO AFFECTED AREA EVERY DAY     B Complex Vitamins (B COMPLEX PO) Take 1 tablet by mouth daily.      blood glucose meter kit and supplies Dispense based on patient and insurance preference. Use to check blood sugar once daily. (FOR ICD-10 E11.9). 1 each 0   buPROPion (WELLBUTRIN XL) 150 MG 24 hr tablet TAKE 1 TABLET BY MOUTH  EVERY DAY 90 tablet 1   calcium elemental as carbonate (BARIATRIC TUMS ULTRA) 400 MG chewable tablet Chew 400 mg by mouth daily.     carvedilol (COREG) 25 MG tablet Take 1 tablet (25 mg total) by mouth 2 (two) times daily. 180 tablet 3   Cholecalciferol (VITAMIN D3) 2000 UNITS TABS Take 1 tablet by mouth daily.     diphenoxylate-atropine (LOMOTIL) 2.5-0.025 MG tablet Take 1 tablet by mouth 4 (four) times daily as needed for diarrhea or loose stools. 30 tablet 1   esomeprazole (NEXIUM) 20 MG capsule Take 10 mg by mouth daily.     fluticasone (FLONASE) 50 MCG/ACT nasal spray Place 2 sprays into both nostrils daily.     losartan (COZAAR) 100 MG tablet Take 100 mg by mouth daily.     Multiple Vitamin (MULTIVITAMIN) tablet Take 1 tablet by mouth daily.     PEG-KCl-NaCl-NaSulf-Na Asc-C (PLENVU) 140 g SOLR Take 140 g by mouth as directed. Manufacturer's coupon Universal coupon code:BIN: P2366821; GROUP: [id number]; PCN: [id number]; ID: [id number]; PAY NO MORE $50 1 each 0   sertraline (ZOLOFT) 100 MG tablet TAKE 1 TABLET BY MOUTH EVERY DAY 90 tablet 1   tolterodine (DETROL) 2 MG tablet TAKE 1 TABLET TWICE A DAY 180 tablet 3   Semaglutide,0.25 or 0.5MG/DOS, (OZEMPIC, 0.25 OR 0.5 MG/DOSE,) 2 MG/1.5ML SOPN Inject 0.25 mg once weekly for 4 weeks, then increase to 0.5 mg weekly (Patient not taking: Reported on 11/21/2020) 1.5 mL 0   Current Facility-Administered Medications  Medication Dose Route Frequency Provider Last Rate Last Admin   diclofenac sodium (VOLTAREN) 1 % transdermal gel 4 g  4 g  Topical QID Mohammed Kindle, MD       midazolam (VERSED) 5 MG/5ML injection 5 mg  5 mg Intravenous Once Mohammed Kindle, MD       orphenadrine (NORFLEX) injection 60 mg  60 mg Intramuscular Once Mohammed Kindle, MD       sodium chloride 0.9 % injection 20 mL  20 mL Other Once Mohammed Kindle, MD       triamcinolone acetonide (KENALOG-40) injection 40 mg  40 mg Other Once Mohammed Kindle, MD          ___________________________________________________________________ Objective   Exam:  BP 110/72   Pulse 88   Ht 5' 2.5" (1.588 m)   Wt 156 lb (70.8 kg)   SpO2 97%   BMI 28.08 kg/m  Wt Readings from Last 3 Encounters:  11/21/20 156 lb (70.8 kg)  11/15/20 160 lb (72.6 kg)  10/09/20 163 lb (73.9 kg)    General: Well-appearing Eyes: sclera anicteric, no redness ENT: oral mucosa moist without lesions, no cervical or supraclavicular lymphadenopathy CV: RRR without murmur, S1/S2, no JVD, no peripheral edema.  Implanted device left upper chest wall Resp: clear to auscultation bilaterally, normal RR and effort noted GI: soft, no tenderness, with active bowel sounds. No guarding or palpable organomegaly noted. Skin; warm and dry, no rash or jaundice noted Neuro: awake, alert and oriented x 3. Normal gross motor function and fluent speech  Labs:  CBC Latest Ref Rng & Units 09/11/2016 06/01/2013 05/31/2013  WBC 4.0 - 10.5 K/uL 8.0 9.6 7.8  Hemoglobin 12.0 - 15.0 g/dL 13.6 12.9 13.3  Hematocrit 36.0 - 46.0 % 40.8 37.4 38.3  Platelets 150.0 - 400.0 K/uL 257.0 204 201   CMP Latest Ref Rng & Units 11/13/2020 08/02/2020 04/02/2020  Glucose 70 - 99 mg/dL 98 116(H) 124(H)  BUN 6 - 23 mg/dL 14 19  17  Creatinine 0.40 - 1.20 mg/dL 0.74 0.76 0.80  Sodium 135 - 145 mEq/L 142 141 142  Potassium 3.5 - 5.1 mEq/L 3.7 4.0 3.8  Chloride 96 - 112 mEq/L 103 104 104  CO2 19 - 32 mEq/L _0 Calcium 8.4 - 10.5 mg/dL 9.4 9.2 9.0  Total Protein 6.0 - 8.3 g/dL 6.6 7.0 6.7  Total Bilirubin 0.2 - 1.2 mg/dL 0.4  0.4 0.4  Alkaline Phos 39 - 117 U/L 101 95 110  AST 0 - 37 U/L _1 ALT 0 - 35 U/L _2 Radiologic Studies:  June 2022 Echo summary: "INTERPRETATION  NORMAL LEFT VENTRICULAR SYSTOLIC FUNCTION  NORMAL RIGHT VENTRICULAR SYSTOLIC FUNCTION  NO VALVULAR STENOSIS  TRIVIAL MR, PR  MILD TR  EF 55% "    Assessment: Encounter Diagnosis  Name Primary?   Chronic diarrhea Yes    Longstanding, has elements of IBS with some maldigestion/food triggers.  Symptoms worsening and perhaps last year with urgency, consider microscopic colitis  Plan:  Written dietary advice given  Colonoscopy scheduled.  She was agreeable after discussion of procedure and risks.  She has lately been taking some Lomotil prescribed by primary care with good effect  Thank you for the courtesy of this consult.  Please call me with any questions or concerns.  Nelida Meuse III  CC: Referring provider noted above

## 2020-11-21 NOTE — Patient Instructions (Addendum)
If you are age 66 or older, your body mass index should be between 23-30. Your Body mass index is 28.08 kg/m. If this is out of the aforementioned range listed, please consider follow up with your Primary Care Provider.  If you are age 71 or younger, your body mass index should be between 19-25. Your Body mass index is 28.08 kg/m. If this is out of the aformentioned range listed, please consider follow up with your Primary Care Provider.   __________________________________________________________  The [facility] providers would like to encourage you to use [facility] to communicate with providers for non-urgent requests or questions.  Due to long hold times on the telephone, sending your provider a message by [facility] may be a faster and more efficient way to get a response.  Please allow 48 business hours for a response.  Please remember that this is for non-urgent requests.   You have been scheduled for a colonoscopy. Please follow written instructions given to you at your visit today.  Please pick up your prep supplies at the pharmacy within the next 1-3 days. If you use inhalers (even only as needed), please bring them with you on the day of your procedure.  Food Guidelines for those with chronic digestive trouble:  Many people have difficulty digesting certain foods, causing a variety of distressing and embarrassing symptoms such as abdominal pain, bloating and gas.  These foods may need to be avoided or consumed in small amounts.  Here are some tips that might be helpful for you.  1.   Lactose intolerance is the difficulty or complete inability to digest lactose, the natural sugar in milk and anything made from milk.  This condition is harmless, common, and can begin any time during life.  Some people can digest a modest amount of lactose while others cannot tolerate any.  Also, not all dairy products contain equal amounts of lactose.  For example, hard cheeses such as parmesan have less lactose  than soft cheeses such as cheddar.  Yogurt has less lactose than milk or cheese.  Many packaged foods (even many brands of bread) have milk, so read ingredient lists carefully.  It is difficult to test for lactose intolerance, so just try avoiding lactose as much as possible for a week and see what happens with your symptoms.  If you seem to be lactose intolerant, the best plan is to avoid it (but make sure you get calcium from another source).  The next best thing is to use lactase enzyme supplements, available over the counter everywhere.  Just know that many lactose intolerant people need to take several tablets with each serving of dairy to avoid symptoms.  Lastly, a lot of restaurant food is made with milk or butter.  Many are things you might not suspect, such as mashed potatoes, rice and pasta (cooked with butter) and "grilled" items.  If you are lactose intolerant, it never hurts to ask your server what has milk or butter.  2.   Fiber is an important part of your diet, but not all fiber is well-tolerated.  Insoluble fiber such as bran is often consumed by normal gut bacteria and converted into gas.  Soluble fiber such as oats, squash, carrots and green beans are typically tolerated better.  3.   Some types of carbohydrates can be poorly digested.  Examples include: fructose (apples, cherries, pears, raisins and other dried fruits), fructans (onions, zucchini, large amounts of wheat), sorbitol/mannitol/xylitol and sucralose/Splenda (common artificial sweeteners), and raffinose (lentils,  broccoli, cabbage, asparagus, brussel sprouts, many types of beans).  Do a Development worker, community for [employer] and you will find helpful information. Beano, a dietary supplement, will often help with raffinose-containing foods.  As with lactase tablets, you may need several per serving.  4.   Whenever possible, avoid processed food&meats and chemical additives.  High fructose corn syrup, a common sweetener, may be difficult to  digest.  Eggs and soy (comes from the soybean, and added to many foods now) are other common bloating/gassy foods.  5.  Regarding gluten:  gluten is a protein mainly found in wheat, but also rye and barley.  There is a condition called celiac sprue, which is an inflammatory reaction in the small intestine causing a variety of digestive symptoms.  Blood testing is highly reliable to look for this condition, and sometimes upper endoscopy with small bowel biopsies may be necessary to make the diagnosis.  Many patients who test negative for celiac sprue report improvement in their digestive symptoms when they switch to a gluten-free diet.  However, in these "non-celiac gluten sensitive" patients, the true role of gluten in their symptoms is unclear.  Reducing carbohydrates in general may decrease the gas and bloating caused when gut bacteria consume carbs. Also, some of these patients may actually be intolerant of the baker's yeast in bread products rather than the gluten.  Flatbread and other reduced yeast breads might therefore be tolerated.  There is no specific testing available for most food intolerances, which are discovered mainly by dietary elimination.  Please do not embark on a gluten free diet unless directed by your doctor, as it is highly restrictive, and may lead to nutritional deficiencies if not carefully monitored.  Lastly, beware of internet claims offering "personalized" tests for food intolerances.  Such testing has no reliable scientific evidence to support its reliability and correlation to symptoms.    6.  The best advice is old advice, especially for those with chronic digestive trouble - try to eat "clean".  Balanced diet, avoid processed food, plenty of fruits and vegetables, cut down the sugar, minimal alcohol, avoid tobacco. Make time to care for yourself, get enough sleep, exercise when you can, reduce stress.  Your guts will thank you for it.   - Dr. Herma Ard  Gastroenterology   It was a pleasure to see you today!  Thank you for trusting me with your gastrointestinal care!

## 2020-11-28 ENCOUNTER — Telehealth: Payer: Self-pay | Admitting: Gastroenterology

## 2020-11-28 NOTE — Telephone Encounter (Signed)
Patient is coming to the office to pick up a prep kit

## 2020-11-28 NOTE — Telephone Encounter (Signed)
Patient called states she her pharmacy has requested a PA for the Plenvu prep that is needed for her but has not heard anything from our office. Her procedure is for Friday 11/30/20

## 2020-11-29 ENCOUNTER — Encounter: Payer: Self-pay | Admitting: Certified Registered Nurse Anesthetist

## 2020-11-30 ENCOUNTER — Encounter: Payer: Self-pay | Admitting: Gastroenterology

## 2020-11-30 ENCOUNTER — Other Ambulatory Visit: Payer: Self-pay

## 2020-11-30 ENCOUNTER — Ambulatory Visit (AMBULATORY_SURGERY_CENTER): Payer: HMO | Admitting: Gastroenterology

## 2020-11-30 VITALS — BP 99/81 | HR 80 | Temp 98.0°F | Resp 19 | Ht 62.5 in | Wt 156.0 lb

## 2020-11-30 DIAGNOSIS — K529 Noninfective gastroenteritis and colitis, unspecified: Secondary | ICD-10-CM | POA: Diagnosis not present

## 2020-11-30 DIAGNOSIS — I509 Heart failure, unspecified: Secondary | ICD-10-CM | POA: Diagnosis not present

## 2020-11-30 DIAGNOSIS — R197 Diarrhea, unspecified: Secondary | ICD-10-CM | POA: Diagnosis not present

## 2020-11-30 DIAGNOSIS — D122 Benign neoplasm of ascending colon: Secondary | ICD-10-CM | POA: Diagnosis not present

## 2020-11-30 DIAGNOSIS — E119 Type 2 diabetes mellitus without complications: Secondary | ICD-10-CM | POA: Diagnosis not present

## 2020-11-30 MED ORDER — SODIUM CHLORIDE 0.9 % IV SOLN: 500.0000 mL | Freq: Once | INTRAVENOUS | Status: DC

## 2020-11-30 NOTE — Patient Instructions (Signed)
Please, read all of the handouts given to you by your recovery room nurse.  YOU HAD AN ENDOSCOPIC PROCEDURE TODAY AT [facility]:   Refer to the procedure report that was given to you for any specific questions about what was found during the examination.  If the procedure report does not answer your questions, please call your gastroenterologist to clarify.  If you requested that your care partner not be given the details of your procedure findings, then the procedure report has been included in a sealed envelope for you to review at your convenience later.  YOU SHOULD EXPECT: Some feelings of bloating in the abdomen. Passage of more gas than usual.  Walking can help get rid of the air that was put into your GI tract during the procedure and reduce the bloating. If you had a lower endoscopy (such as a colonoscopy or flexible sigmoidoscopy) you may notice spotting of blood in your stool or on the toilet paper. If you underwent a bowel prep for your procedure, you may not have a normal bowel movement for a few days.  Please Note:  You might notice some irritation and congestion in your nose or some drainage.  This is from the oxygen used during your procedure.  There is no need for concern and it should clear up in a day or so.  SYMPTOMS TO REPORT IMMEDIATELY:  Following lower endoscopy (colonoscopy or flexible sigmoidoscopy):  Excessive amounts of blood in the stool  Significant tenderness or worsening of abdominal pains  Swelling of the abdomen that is new, acute  Fever of 100F or higherPainful or persistently difficult swallowing  New shortness of breath  Fever of 100F or higher  Black, tarry-looking stools  For urgent or emergent issues, a gastroenterologist can be reached at any hour by calling [phone]. Do not use MyChart messaging for urgent concerns.    DIET:  We do recommend a small meal at first, but then you may proceed to your regular diet.  Drink plenty of  fluids but you should avoid alcoholic beverages for 24 hours.  ACTIVITY:  You should plan to take it easy for the rest of today and you should NOT DRIVE or use heavy machinery until tomorrow (because of the sedation medicines used during the test).    FOLLOW UP: Our staff will call the number listed on your records 48-72 hours following your procedure to check on you and address any questions or concerns that you may have regarding the information given to you following your procedure. If we do not reach you, we will leave a message.  We will attempt to reach you two times.  During this call, we will ask if you have developed any symptoms of COVID 19. If you develop any symptoms (ie: fever, flu-like symptoms, shortness of breath, cough etc.) before then, please call [phone].  If you test positive for Covid 19 in the 2 weeks post procedure, please call and report this information to [place].    If any biopsies were taken you will be contacted by phone or by letter within the next 1-3 weeks.  Please call us at [phone] if you have not heard about the biopsies in 3 weeks.    SIGNATURES/CONFIDENTIALITY: You and/or your care partner have signed paperwork which will be entered into your electronic medical record.  These signatures attest to the fact that that the information above on your After Visit Summary has been reviewed and is understood.  Full  responsibility of the confidentiality of this discharge information lies with you and/or your care-partner.  

## 2020-11-30 NOTE — Progress Notes (Signed)
Report given to PACU, vss 

## 2020-11-30 NOTE — Progress Notes (Signed)
No changes to clinical history since GI office visit on 11/21/20.  The patient is appropriate for an endoscopic procedure in the ambulatory setting.

## 2020-11-30 NOTE — Op Note (Signed)
[facility] Patient Name: Julia Pierce Procedure Date: 11/30/2020 10:20 AM MRN: [id number] Endoscopist: Mallie Mussel L. Loletha Carrow , MD Age: 65 Referring MD:  Date of Birth: 1955-08-09 Gender: Female Account #: [id number] Procedure:                Colonoscopy Indications:              Chronic diarrhea Medicines:                Monitored Anesthesia Care Procedure:                Pre-Anesthesia Assessment:                           - Prior to the procedure, a History and Physical                            was performed, and patient medications and                            allergies were reviewed. The patient's tolerance of                            previous anesthesia was also reviewed. The risks                            and benefits of the procedure and the sedation                            options and risks were discussed with the patient.                            All questions were answered, and informed consent                            was obtained. Prior Anticoagulants: The patient has                            taken no previous anticoagulant or antiplatelet                            agents. ASA Grade Assessment: III - A patient with                            severe systemic disease. After reviewing the risks                            and benefits, the patient was deemed in                            satisfactory condition to undergo the procedure.                           After obtaining informed consent, the colonoscope  was passed under direct vision. Throughout the                            procedure, the patient's blood pressure, pulse, and                            oxygen saturations were monitored continuously. The                            Olympus PCF-[id number]) Colonoscope was                            introduced through the anus and advanced to the the                            terminal ileum, with identification of the                             appendiceal orifice and IC valve. The colonoscopy                            was extremely difficult due to multiple diverticula                            in the colon, a redundant colon, significant                            looping and a tortuous colon. Successful completion                            of the procedure was aided by changing the patient                            to semi-prone and semi-supine positions and using                            manual pressure. The patient tolerated the                            procedure well. The quality of the bowel                            preparation was good. The terminal ileum, ileocecal                            valve, appendiceal orifice, and rectum were                            photographed. Scope In: 10:34:25 AM Scope Out: 10:56:30 AM Scope Withdrawal Time: 0 hours 11 minutes 50 seconds  Total Procedure Duration: 0 hours 22 minutes 5 seconds  Findings:                 The digital rectal exam findings include decreased  sphincter tone.                           The terminal ileum appeared normal.                           Normal mucosa was found in the entire colon.                            Biopsies for histology were taken with a cold                            forceps from the right colon and left colon for                            evaluation of microscopic colitis.                           Multiple diverticula were found in the left colon.                           A 6 mm polyp was found in the distal ascending                            colon. The polyp was sessile. The polyp was removed                            with a cold snare. Resection and retrieval were                            complete.                           The exam was otherwise without abnormality on                            direct and retroflexion views. Complications:            No immediate  complications. Estimated Blood Loss:     Estimated blood loss was minimal. Impression:               - Decreased sphincter tone found on digital rectal                            exam.                           - The examined portion of the ileum was normal.                           - Normal mucosa in the entire examined colon.                            Biopsied.                           -  Diverticulosis in the left colon.                           - One 6 mm polyp in the distal ascending colon,                            removed with a cold snare. Resected and retrieved.                           - The examination was otherwise normal on direct                            and retroflexion views. Recommendation:           - Patient has a contact number available for                            emergencies. The signs and symptoms of potential                            delayed complications were discussed with the                            patient. Return to normal activities tomorrow.                            Written discharge instructions were provided to the                            patient.                           - Resume previous diet.                           - Continue present medications.                           - Await pathology results.                           - Repeat colonoscopy is recommended for                            surveillance. The colonoscopy date will be                            determined after pathology results from today's                            exam become available for review. Talene Glastetter L. Loletha Carrow, MD 11/30/2020 11:00:57 AM This report has been signed electronically.

## 2020-11-30 NOTE — Progress Notes (Signed)
Called to room to assist during endoscopic procedure.  Patient ID and intended procedure confirmed with present staff. Received instructions for my participation in the procedure from the performing physician.  

## 2020-11-30 NOTE — Progress Notes (Signed)
Pt's states no medical or surgical changes since previsit or office visit. 

## 2020-12-03 DIAGNOSIS — Z872 Personal history of diseases of the skin and subcutaneous tissue: Secondary | ICD-10-CM | POA: Diagnosis not present

## 2020-12-03 DIAGNOSIS — L718 Other rosacea: Secondary | ICD-10-CM | POA: Diagnosis not present

## 2020-12-03 DIAGNOSIS — Z86018 Personal history of other benign neoplasm: Secondary | ICD-10-CM | POA: Diagnosis not present

## 2020-12-03 DIAGNOSIS — L578 Other skin changes due to chronic exposure to nonionizing radiation: Secondary | ICD-10-CM | POA: Diagnosis not present

## 2020-12-04 ENCOUNTER — Telehealth: Payer: Self-pay | Admitting: *Deleted

## 2020-12-04 NOTE — Telephone Encounter (Signed)
  Follow up Call-  Call back number 11/30/2020  Post procedure Call Back phone  # [phone]  Permission to leave phone message Yes  Some recent data might be hidden     Patient questions:  Do you have a fever, pain , or abdominal swelling? No. Pain Score  0 *  Have you tolerated food without any problems? Yes.    Have you been able to return to your normal activities? Yes.    Do you have any questions about your discharge instructions: Diet   No. Medications  No. Follow up visit  No.  Do you have questions or concerns about your Care? No.  Actions: * If pain score is 4 or above: No action needed, pain <4.  Have you developed a fever since your procedure? no  2.   Have you had an respiratory symptoms (SOB or cough) since your procedure? no  3.   Have you tested positive for COVID 19 since your procedure no  4.   Have you had any family members/close contacts diagnosed with the COVID 19 since your procedure?  no   If yes to any of these questions please route to Joylene John, RN and Joella Prince, RN

## 2020-12-04 NOTE — Telephone Encounter (Signed)
Follow up call made. 

## 2020-12-11 ENCOUNTER — Encounter: Payer: Self-pay | Admitting: Gastroenterology

## 2020-12-11 ENCOUNTER — Other Ambulatory Visit: Payer: Self-pay | Admitting: Internal Medicine

## 2020-12-19 ENCOUNTER — Ambulatory Visit (INDEPENDENT_AMBULATORY_CARE_PROVIDER_SITE_OTHER): Payer: HMO

## 2020-12-19 DIAGNOSIS — I428 Other cardiomyopathies: Secondary | ICD-10-CM

## 2020-12-25 LAB — CUP PACEART REMOTE DEVICE CHECK
Battery Remaining Longevity: 13 mo
Battery Voltage: 2.88 V
Brady Statistic AP VP Percent: 0.36 %
Brady Statistic AP VS Percent: 0 %
Brady Statistic AS VP Percent: 99.64 %
Brady Statistic AS VS Percent: 0 %
Brady Statistic RA Percent Paced: 0.36 %
Brady Statistic RV Percent Paced: 99.99 %
Date Time Interrogation Session: 20220901155750
Implantable Lead Implant Date: 20150212
Implantable Lead Implant Date: 20150212
Implantable Lead Implant Date: 20150212
Implantable Lead Location: 753858
Implantable Lead Location: 753859
Implantable Lead Location: 753860
Implantable Lead Model: 4194
Implantable Lead Model: 5076
Implantable Lead Model: 5076
Implantable Pulse Generator Implant Date: 20150212
Lead Channel Impedance Value: 323 Ohm
Lead Channel Impedance Value: 342 Ohm
Lead Channel Impedance Value: 380 Ohm
Lead Channel Impedance Value: 380 Ohm
Lead Channel Impedance Value: 418 Ohm
Lead Channel Impedance Value: 437 Ohm
Lead Channel Impedance Value: 456 Ohm
Lead Channel Impedance Value: 513 Ohm
Lead Channel Impedance Value: 532 Ohm
Lead Channel Pacing Threshold Amplitude: 0.75 V
Lead Channel Pacing Threshold Amplitude: 1 V
Lead Channel Pacing Threshold Amplitude: 1.875 V
Lead Channel Pacing Threshold Pulse Width: 0.4 ms
Lead Channel Pacing Threshold Pulse Width: 0.4 ms
Lead Channel Pacing Threshold Pulse Width: 0.4 ms
Lead Channel Sensing Intrinsic Amplitude: 1.375 mV
Lead Channel Sensing Intrinsic Amplitude: 1.375 mV
Lead Channel Sensing Intrinsic Amplitude: 19.25 mV
Lead Channel Sensing Intrinsic Amplitude: 19.25 mV
Lead Channel Setting Pacing Amplitude: 1.5 V
Lead Channel Setting Pacing Amplitude: 2 V
Lead Channel Setting Pacing Amplitude: 2.5 V
Lead Channel Setting Pacing Pulse Width: 0.4 ms
Lead Channel Setting Pacing Pulse Width: 0.8 ms
Lead Channel Setting Sensing Sensitivity: 0.9 mV

## 2020-12-31 ENCOUNTER — Ambulatory Visit (INDEPENDENT_AMBULATORY_CARE_PROVIDER_SITE_OTHER): Payer: HMO | Admitting: Pharmacist

## 2020-12-31 ENCOUNTER — Telehealth: Payer: Self-pay | Admitting: Pharmacist

## 2020-12-31 DIAGNOSIS — E1169 Type 2 diabetes mellitus with other specified complication: Secondary | ICD-10-CM

## 2020-12-31 DIAGNOSIS — M791 Myalgia, unspecified site: Secondary | ICD-10-CM

## 2020-12-31 DIAGNOSIS — T466X5A Adverse effect of antihyperlipidemic and antiarteriosclerotic drugs, initial encounter: Secondary | ICD-10-CM

## 2020-12-31 DIAGNOSIS — I5022 Chronic systolic (congestive) heart failure: Secondary | ICD-10-CM

## 2020-12-31 DIAGNOSIS — I429 Cardiomyopathy, unspecified: Secondary | ICD-10-CM

## 2020-12-31 DIAGNOSIS — I10 Essential (primary) hypertension: Secondary | ICD-10-CM

## 2020-12-31 MED ORDER — EMPAGLIFLOZIN 10 MG PO TABS: 10.0000 mg | ORAL_TABLET | Freq: Every day | ORAL | 0 refills | Status: DC

## 2020-12-31 NOTE — Chronic Care Management (AMB) (Signed)
Chronic Care Management Pharmacy Note  12/31/2020 Name:  Danisha Brassfield MRN:  [id number] DOB:  10-02-1955   Subjective: Mckenze Slone is an 65 y.o. year old female who is a primary patient of Derrel Nip, Aris Everts, MD.  The CCM team was consulted for assistance with disease management and care coordination needs.    Engaged with patient by telephone for follow up visit in response to provider referral for pharmacy case management and/or care coordination services.   Consent to Services:  The patient was given information about Chronic Care Management services, agreed to services, and gave verbal consent prior to initiation of services.  Please see initial visit note for detailed documentation.   Patient Care Team: Crecencio Mc, MD as PCP - General (Internal Medicine) De Hollingshead, RPH-CPP (Pharmacist)   Objective:  Lab Results  Component Value Date   CREATININE 0.74 11/13/2020   CREATININE 0.76 08/02/2020   CREATININE 0.80 04/02/2020    Lab Results  Component Value Date   HGBA1C 6.1 11/13/2020   Last diabetic Eye exam:  Lab Results  Component Value Date/Time   HMDIABEYEEXA No Retinopathy 10/19/2020 12:00 AM    Last diabetic Foot exam:  Lab Results  Component Value Date/Time   HMDIABFOOTEX normaL 12/07/2013 12:00 AM        Component Value Date/Time   CHOL 216 (H) 11/13/2020 0911   TRIG 347.0 (H) 11/13/2020 0911   HDL 34.20 (L) 11/13/2020 0911   CHOLHDL 6 11/13/2020 0911   VLDL 69.4 (H) 11/13/2020 0911   LDLCALC 88 08/17/2013 0836   LDLDIRECT 132.0 11/13/2020 0911    Hepatic Function Latest Ref Rng & Units 11/13/2020 08/02/2020 04/02/2020  Total Protein 6.0 - 8.3 g/dL 6.6 7.0 6.7  Albumin 3.5 - 5.2 g/dL 4.2 4.0 4.2  AST 0 - 37 U/L _0 ALT 0 - 35 U/L _1 Alk Phosphatase 39 - 117 U/L 101 95 110  Total Bilirubin 0.2 - 1.2 mg/dL 0.4 0.4 0.4    Lab Results  Component Value Date/Time   TSH 3.80 12/12/2016 08:39 AM   TSH 3.11 05/09/2013 10:23  AM    CBC Latest Ref Rng & Units 09/11/2016 06/01/2013 05/31/2013  WBC 4.0 - 10.5 K/uL 8.0 9.6 7.8  Hemoglobin 12.0 - 15.0 g/dL 13.6 12.9 13.3  Hematocrit 36.0 - 46.0 % 40.8 37.4 38.3  Platelets 150.0 - 400.0 K/uL 257.0 204 201    Lab Results  Component Value Date/Time   VD25OH 43.82 12/12/2016 08:39 AM    Clinical ASCVD: No  The 10-year ASCVD risk score (Arnett DK, et al., 2019) is: 11.7%   Values used to calculate the score:     Age: 75 years     Sex: Female     Is Non-Hispanic African American: No     Diabetic: Yes     Tobacco smoker: No     Systolic Blood Pressure: 99 mmHg     Is BP treated: Yes     HDL Cholesterol: 34.2 mg/dL     Total Cholesterol: 216 mg/dL      Social History   Tobacco Use  Smoking Status Never  Smokeless Tobacco Never   BP Readings from Last 3 Encounters:  11/30/20 99/81  11/21/20 110/72  11/15/20 110/64   Pulse Readings from Last 3 Encounters:  11/30/20 80  11/21/20 88  11/15/20 81   Wt Readings from Last 3 Encounters:  11/30/20 156 lb (70.8 kg)  11/21/20 156 lb (70.8  kg)  11/15/20 160 lb (72.6 kg)    Assessment: Review of patient past medical history, allergies, medications, health status, including review of consultants reports, laboratory and other test data, was performed as part of comprehensive evaluation and provision of chronic care management services.   SDOH:  (Social Determinants of Health) assessments and interventions performed:    CCM Care Plan  Allergies  Allergen Reactions   Cephalexin Anaphylaxis   Penicillin G Hives, Shortness Of Breath and Swelling   Penicillins Hives, Shortness Of Breath and Swelling   Atorvastatin     Other reaction(s): Muscle Pain myalgias myalgias   Cymbalta [Duloxetine Hcl] Other (See Comments)   Duloxetine     Other reaction(s): Muscle Pain Other reaction(s): Muscle Pain   Fenofibrate     Muscle pain    Statins     Severe myalgias   Zofran [Ondansetron] Other (See Comments)     Prolong QT   Latex Rash    Medications Reviewed Today     Reviewed by De Hollingshead, RPH-CPP (Pharmacist) on 12/31/20 at [phone]  Med List Status: <None>   Medication Order Taking? Sig Documenting Provider Last Dose Status Informant  Accu-Chek FastClix Lancets MISC [id number] Yes TEST ONCE DAILY E11.9 Crecencio Mc, MD Taking Active   ACCU-CHEK GUIDE test strip [id number] Yes TEST ONCE DAILY E11.9 Crecencio Mc, MD Taking Active   amLODipine (NORVASC) 2.5 MG tablet [id number] Yes TAKE 1 TABLET BY MOUTH EVERY DAY Crecencio Mc, MD Taking Active   Azelaic Acid 15 % cream [id number] Yes APPLY TO AFFECTED AREA EVERY DAY [provider] Taking Active            Med Note Albin Felling, [place]   Wed Aug 15, 2020 11:11 AM) BID in AM & PM  B Complex Vitamins (B COMPLEX PO) [id number] Yes Take 1 tablet by mouth daily.  [provider] Taking Active Self  blood glucose meter kit and supplies [id number] Yes Dispense based on patient and insurance preference. Use to check blood sugar once daily. (FOR ICD-10 E11.9). Crecencio Mc, MD Taking Active   buPROPion (WELLBUTRIN XL) 150 MG 24 hr tablet [id number] Yes TAKE 1 TABLET BY MOUTH EVERY DAY Crecencio Mc, MD Taking Active   calcium elemental as carbonate (BARIATRIC TUMS ULTRA) 400 MG chewable tablet [id number] Yes Chew 400 mg by mouth daily. [provider] Taking Active            Med Note Olena Heckle, MICHELE   Wed Apr 01, 2017  3:12 PM)    carvedilol (COREG) 25 MG tablet [id number] Yes Take 1 tablet (25 mg total) by mouth 2 (two) times daily. Evans Lance, MD Taking Active   Cholecalciferol (VITAMIN D3) 2000 UNITS TABS [id number] Yes Take 1 tablet by mouth daily. [provider] Taking Active Self  diclofenac sodium (VOLTAREN) 1 % transdermal gel 4 g [id number]   Mohammed Kindle, MD  Consider Medication Status and Discontinue (Completed Course)   diphenoxylate-atropine (LOMOTIL) 2.5-0.025 MG tablet [id number] Yes Take 1  tablet by mouth 4 (four) times daily as needed for diarrhea or loose stools. Crecencio Mc, MD Taking Active   esomeprazole (NEXIUM) 20 MG capsule [id number] Yes Take 10 mg by mouth daily. [provider] Taking Active Self  fluticasone (FLONASE) 50 MCG/ACT nasal spray [id number] Yes Place 2 sprays into both nostrils daily. [provider] Taking Active            Med Note (NWOGU, IVY A  Mon Oct 08, 2020  9:02 AM) Taking PRN  losartan (COZAAR) 100 MG tablet [id number] Yes Take 100 mg by mouth daily. [provider] Taking Active            Med Note Darnelle Maffucci, Arville Lime   Wed Nov 14, 2020  9:10 AM)    midazolam (VERSED) 5 MG/5ML injection 5 mg [id number]   Mohammed Kindle, MD  Consider Medication Status and Discontinue   Multiple Vitamin (MULTIVITAMIN) tablet [id number] Yes Take 1 tablet by mouth daily. [provider] Taking Active Self  orphenadrine (NORFLEX) injection 60 mg [id number]   Mohammed Kindle, MD  Consider Medication Status and Discontinue   sertraline (ZOLOFT) 100 MG tablet [id number] Yes TAKE 1 TABLET BY MOUTH EVERY DAY Crecencio Mc, MD Taking Active   sodium chloride 0.9 % injection 20 mL [id number]   Mohammed Kindle, MD  Consider Medication Status and Discontinue (Completed Course)   tolterodine (DETROL) 2 MG tablet [id number] Yes TAKE 1 TABLET BY MOUTH TWICE A DAY Crecencio Mc, MD Taking Active   triamcinolone acetonide (KENALOG-40) injection 40 mg [id number]   Mohammed Kindle, MD  Consider Medication Status and Discontinue   Med List Note Landis Martins, RN 12/31/15 1040): UDS 09-20-15 meds  Due 02-11-16 CS 11/30/2014            Patient Active Problem List   Diagnosis Date Noted   Osteoporosis 11/17/2020   LBBB (left bundle branch block) 04/24/2020   Depression, major, single episode, mild ([place]) 08/27/2017   S/P total abdominal hysterectomy 06/12/2016   Bladder prolapse, female, acquired 06/12/2016   Bilateral occipital neuralgia  11/02/2014   Cervical facet syndrome 11/02/2014   Facet syndrome, lumbar 11/02/2014   Chronic pain syndrome 10/18/2014   DDD (degenerative disc disease), lumbar 10/03/2014   DDD (degenerative disc disease), cervical 10/03/2014   Gout of big toe 08/02/2014   History of shingles 12/07/2013   Biventricular cardiac pacemaker in situ 12/07/2013   Cervical stenosis of spine 05/11/2013   Erythema migrans (Lyme disease) 12/06/2012   Myalgia due to statin 03/03/2012   Reflux esophagitis 12/31/2011   Type 2 diabetes mellitus with hypertriglyceridemia ([place]) 09/27/2011   Obesity (BMI 30.0-34.9) 05/12/2011   Morton's neuroma    Cardiomyopathy ([place]) 03/31/2011   Long QT syndrome 02/05/2011   Hypertension     Immunization History  Administered Date(s) Administered   Fluad Quad(high Dose 65+) 01/31/2020   Influenza Split 03/28/2014   Influenza,inj,Quad PF,6+ Mos 02/07/2013, 04/11/2015, 03/06/2016, 03/18/2017, 03/24/2018, 02/04/2019   PFIZER(Purple Top)SARS-COV-2 Vaccination 07/29/2019, 08/23/2019   Pneumococcal Conjugate-13 06/28/2014   Pneumococcal Polysaccharide-23 05/11/2013, 04/04/2020   Tdap 02/08/2008, 03/24/2018    Conditions to be addressed/monitored: HTN, HLD, DMII, and Depression  Care Plan : Medication Management  Updates made by De Hollingshead, RPH-CPP since 12/31/2020 12:00 AM     Problem: T2DM, Depression/Anxiety, Hyperlipidemia, Chronic Pain, Gout      Long-Range Goal: Disease Progression Prevention   This Visit's Progress: On track  Recent Progress: On track  Priority: High  Note:   Current Barriers:  Unable to independently afford treatment regimen Suboptimal therapeutic regimen for ASCVD risk reduction  Pharmacist Clinical Goal(s):  Over the next 90 days, patient will verbalize ability to afford treatment regimen through collaboration with PharmD and provider.   Interventions: 1:1 collaboration with Crecencio Mc, MD regarding development and update of  comprehensive plan of care as evidenced by provider attestation and co-signature Inter-disciplinary care team collaboration (see longitudinal plan of care)  Comprehensive medication review performed; medication list updated in electronic medical record  Health Maintenance Yearly diabetic eye exam: up to date Yearly diabetic foot exam: up to date Urine microalbumin: up to date Yearly influenza vaccination: due - recommended today Td/Tdap vaccination: up to date Pneumonia vaccination: up to date COVID vaccinations: due - recommended bivalent booster Shingrix vaccinations: due - will discuss moving forward Colonoscopy: up to date Bone density scan: up to date - completed Mammogram: up to date  Diabetes: Controlled; current treatment: has been off Ozempic for about 1-2 months now. Severe GI upset, decrease in appetite, nausea.  No hx metformin, though functional diarrhea at baseline Hx Ozempic - severe nausea, stomach upset.  Current glucose readings: 100-110s fasting, highest post prandial 150 Discussed use of SGLT2 d/t cardiovascular and renal risk reduction rather than restarting DPP4. Counseled on SGLT2, including mechanism of action, side effects, and benefits. Discussed potential side effects of dehydration, genitourinary infections. Encouraged adequate hydration and genital hygiene. Advised on sick day rules (if a day with significantly reduced oral intake, serious vomiting, or diarrhea, hold SGLT2). Patient verbalized understanding.  Start Jardiance 10 mg daily. Providing sample  Hypertension, Cardiomyopathy; s/p pacer placement: Controlled per last office visit; current treatment: amlodipine 2.5 mg daily, losartan 100 mg daily, carvedilol 25 mg BID Hx of metoprolol - hair loss Previously recommended to continue current treatment with home monitoring in collaboration with [name] (EP) and Dr. [name] (cardiology).  Hyperlipidemia: Uncontrolled; current treatment:  none Medications previously tried: atorvastatin; reported myalgias. Ezetimibe, does not remember reason for discontinuation. Refuses to re-trial other statins due to a family history of statin associated myalgias (mother, father, brother).  Current dietary patterns: tries to eat lean meats, avoiding saturated fats. Already avoid corn oils d/t association with diarrhea Previously provided education on benefit of statins in DM and lipid lowering goals. Moving forward, could consider PCSK9i for primary prevention due to statin intolerance. Patient not interested in additional medication at this time.   Depression/Anxiety Moderately well controlled; current treatment: sertraline 100 mg daily, bupropion XL 150 mg daily  Previously recommended to continue current regimen at this time  Overactive Bladder: Well controlled per patient report; current regimen: tolterodine LA 2 mg BID Previously recommended to continue current regimen at this time  Rosacea: Controlled per patient report;; current regimen; azelaic acid 1%, follows w/ Dr. Aubery Lapping.  Previously recommended to continue current regimen at this time.  GERD/IBS: Diarrhea uncontrolled; esomeprazole 20 mg daily PRN, lomotil PRN - reports usually needing 2 doses (4 hours apart) when she is having diarrhea.  Recommended to continue current regimen at this time  Allergies: Controlled per patient report; current regimen: fluticasone nasal spray BID PRN.  Previously recommended to continue current regimen at this time  Patient Goals/Self-Care Activities Over the next 90 days, patient will:  - take medications as prescribed collaborate with provider on medication access solutions  Follow Up Plan: Telephone follow up appointment with care management team member scheduled for:  ~5 weeks      Medication Assistance: None required.  Patient affirms current coverage meets needs.  Patient's preferred pharmacy is:  [id number]  [place] [id number]: [phone]: [phone]  Follow Up:  Patient agrees to Care Plan and Follow-up.  Plan: Telephone follow up appointment with care management team member scheduled for:  ~ 5 weeks  Catie TDarnelle Maffucci PharmD, BNam[place]outh WenatcheeClinical Pharmacist [place]  [facility] [phone]

## 2020-12-31 NOTE — Telephone Encounter (Signed)
Medication Samples have been placed at the front desk for the patient to pick up  Drug name: Jardinace       Strength: 10 mg        Qty: 4 boxes  LOT: [id number]  Exp.Date: 08/2022  Dosing instructions: Take 1 tablet by mouth daily  The patient has been instructed regarding the correct time, dose, and frequency of taking this medication, including desired effects and most common side effects.   De Hollingshead 12:14 PM 12/31/2020

## 2020-12-31 NOTE — Patient Instructions (Signed)
It was great talking with you today!  Start Jardiance 10 mg daily. Take this medication in the morning, as it can cause more frequent urination with more sugar in the urine. It may worsen risk for dehydration or genital infections. Focus on staying well hydrated and using good genital hygiene. Stop the medication and call our office if you develop any symptoms of genital infections, such as burning, itching, or pain while urinating or itching with redness that could be a yeast infection. If you have a day that you are vomiting or having diarrhea and you are very dehydrated, please hold this medication until you feel better.   Check your blood sugars twice daily:  1) Fasting, first thing in the morning before breakfast and  2) 2 hours after your largest meal.   For a goal A1c of less than 7%, goal fasting readings are less than 130 and goal 2 hour after meal readings are less than 180.   We recommend you get the yearly influenza vaccine for this season. We recommend you get the updated bivalent COVID-19 booster, at least 2 months after any prior doses. You can get this at local pharmacies.   Call me with any questions or concerns!  Catie Darnelle Maffucci, PharmD [phone]  Visit Information  PATIENT GOALS:  Goals Addressed               This Visit's Progress     Patient Stated     Medication Monitoring (pt-stated)        Patient Goals/Self-Care Activities Over the next 90 days, patient will:  - take medications as prescribed - collaborate with provider on medication access solutions.        Patient verbalizes understanding of instructions provided today and agrees to view in [place].   Plan: Telephone follow up appointment with care management team member scheduled for:  ~ 5 weeks  Catie Darnelle Maffucci, PharmD, [place], [place] Clinical Pharmacist [employer] [facility] [employer] [phone]

## 2021-01-01 NOTE — Progress Notes (Signed)
Remote pacemaker transmission.   

## 2021-01-02 NOTE — Telephone Encounter (Signed)
Patient picked up sample yesterday

## 2021-01-18 DIAGNOSIS — I10 Essential (primary) hypertension: Secondary | ICD-10-CM | POA: Diagnosis not present

## 2021-01-18 DIAGNOSIS — I5022 Chronic systolic (congestive) heart failure: Secondary | ICD-10-CM

## 2021-01-18 DIAGNOSIS — E781 Pure hyperglyceridemia: Secondary | ICD-10-CM | POA: Diagnosis not present

## 2021-01-18 DIAGNOSIS — E1169 Type 2 diabetes mellitus with other specified complication: Secondary | ICD-10-CM

## 2021-02-01 ENCOUNTER — Other Ambulatory Visit: Payer: Self-pay | Admitting: Internal Medicine

## 2021-02-04 ENCOUNTER — Telehealth: Payer: HMO

## 2021-02-04 ENCOUNTER — Ambulatory Visit: Payer: Self-pay

## 2021-02-06 ENCOUNTER — Ambulatory Visit (INDEPENDENT_AMBULATORY_CARE_PROVIDER_SITE_OTHER): Payer: HMO | Admitting: Pharmacist

## 2021-02-06 DIAGNOSIS — I10 Essential (primary) hypertension: Secondary | ICD-10-CM

## 2021-02-06 DIAGNOSIS — E1169 Type 2 diabetes mellitus with other specified complication: Secondary | ICD-10-CM

## 2021-02-06 DIAGNOSIS — I429 Cardiomyopathy, unspecified: Secondary | ICD-10-CM

## 2021-02-06 DIAGNOSIS — T466X5A Adverse effect of antihyperlipidemic and antiarteriosclerotic drugs, initial encounter: Secondary | ICD-10-CM

## 2021-02-06 NOTE — Chronic Care Management (AMB) (Signed)
Chronic Care Management Pharmacy Note  02/06/2021 Name:  Julia Pierce MRN:  [id number] DOB:  Sep 07, 1955   Subjective: Julia Pierce is an 65 y.o. year old female who is a primary patient of Derrel Nip, Aris Everts, MD.  The CCM team was consulted for assistance with disease management and care coordination needs.    Engaged with patient by telephone for follow up visit in response to provider referral for pharmacy case management and/or care coordination services.   Consent to Services:  The patient was given information about Chronic Care Management services, agreed to services, and gave verbal consent prior to initiation of services.  Please see initial visit note for detailed documentation.   Patient Care Team: Crecencio Mc, MD as PCP - General (Internal Medicine) De Hollingshead, RPH-CPP (Pharmacist)    Objective:  Lab Results  Component Value Date   CREATININE 0.74 11/13/2020   CREATININE 0.76 08/02/2020   CREATININE 0.80 04/02/2020    Lab Results  Component Value Date   HGBA1C 6.1 11/13/2020   Last diabetic Eye exam:  Lab Results  Component Value Date/Time   HMDIABEYEEXA No Retinopathy 10/19/2020 12:00 AM    Last diabetic Foot exam:  Lab Results  Component Value Date/Time   HMDIABFOOTEX normaL 12/07/2013 12:00 AM        Component Value Date/Time   CHOL 216 (H) 11/13/2020 0911   TRIG 347.0 (H) 11/13/2020 0911   HDL 34.20 (L) 11/13/2020 0911   CHOLHDL 6 11/13/2020 0911   VLDL 69.4 (H) 11/13/2020 0911   LDLCALC 88 08/17/2013 0836   LDLDIRECT 132.0 11/13/2020 0911    Hepatic Function Latest Ref Rng & Units 11/13/2020 08/02/2020 04/02/2020  Total Protein 6.0 - 8.3 g/dL 6.6 7.0 6.7  Albumin 3.5 - 5.2 g/dL 4.2 4.0 4.2  AST 0 - 37 U/L '18 16 15  ' ALT 0 - 35 U/L '14 13 12  ' Alk Phosphatase 39 - 117 U/L 101 95 110  Total Bilirubin 0.2 - 1.2 mg/dL 0.4 0.4 0.4    Lab Results  Component Value Date/Time   TSH 3.80 12/12/2016 08:39 AM   TSH 3.11 05/09/2013  10:23 AM    CBC Latest Ref Rng & Units 09/11/2016 06/01/2013 05/31/2013  WBC 4.0 - 10.5 K/uL 8.0 9.6 7.8  Hemoglobin 12.0 - 15.0 g/dL 13.6 12.9 13.3  Hematocrit 36.0 - 46.0 % 40.8 37.4 38.3  Platelets 150.0 - 400.0 K/uL 257.0 204 201    Lab Results  Component Value Date/Time   VD25OH 43.82 12/12/2016 08:39 AM     Social History   Tobacco Use  Smoking Status Never  Smokeless Tobacco Never   BP Readings from Last 3 Encounters:  11/30/20 99/81  11/21/20 110/72  11/15/20 110/64   Pulse Readings from Last 3 Encounters:  11/30/20 80  11/21/20 88  11/15/20 81   Wt Readings from Last 3 Encounters:  11/30/20 156 lb (70.8 kg)  11/21/20 156 lb (70.8 kg)  11/15/20 160 lb (72.6 kg)    Assessment: Review of patient past medical history, allergies, medications, health status, including review of consultants reports, laboratory and other test data, was performed as part of comprehensive evaluation and provision of chronic care management services.   SDOH:  (Social Determinants of Health) assessments and interventions performed:    CCM Care Plan  Allergies  Allergen Reactions   Cephalexin Anaphylaxis   Penicillin G Hives, Shortness Of Breath and Swelling   Penicillins Hives, Shortness Of Breath and Swelling   Atorvastatin  Other reaction(s): Muscle Pain myalgias myalgias   Cymbalta [Duloxetine Hcl] Other (See Comments)   Duloxetine     Other reaction(s): Muscle Pain Other reaction(s): Muscle Pain   Fenofibrate     Muscle pain    Statins     Severe myalgias   Zofran [Ondansetron] Other (See Comments)    Prolong QT   Latex Rash    Medications Reviewed Today     Reviewed by De Hollingshead, RPH-CPP (Pharmacist) on 02/06/21 at 1506  Med List Status: <None>   Medication Order Taking? Sig Documenting Provider Last Dose Status Informant  Accu-Chek FastClix Lancets MISC [id number]  TEST ONCE DAILY E11.9 Crecencio Mc, MD  Active   ACCU-CHEK GUIDE test strip  [id number]  TEST ONCE DAILY E11.9 Crecencio Mc, MD  Active   amLODipine (NORVASC) 2.5 MG tablet [id number] Yes TAKE 1 TABLET BY MOUTH EVERY DAY Crecencio Mc, MD Taking Active   Azelaic Acid 15 % cream [id number]  APPLY TO AFFECTED AREA EVERY DAY [provider]  Active            Med Note Albin Felling, Merna   Wed Aug 15, 2020 11:11 AM) BID in AM & PM  B Complex Vitamins (B COMPLEX PO) [id number] Yes Take 1 tablet by mouth daily.  [provider] Taking Active Self  blood glucose meter kit and supplies [id number]  Dispense based on patient and insurance preference. Use to check blood sugar once daily. (FOR ICD-10 E11.9). Crecencio Mc, MD  Active   buPROPion (WELLBUTRIN XL) 150 MG 24 hr tablet [id number] Yes TAKE 1 TABLET BY MOUTH EVERY DAY Crecencio Mc, MD Taking Active   calcium elemental as carbonate (BARIATRIC TUMS ULTRA) 400 MG chewable tablet [id number] Yes Chew 400 mg by mouth daily. [provider] Taking Active            Med Note Olena Heckle, MICHELE   Wed Apr 01, 2017  3:12 PM)    carvedilol (COREG) 25 MG tablet [id number] Yes Take 1 tablet (25 mg total) by mouth 2 (two) times daily. Evans Lance, MD Taking Active   Cholecalciferol (VITAMIN D3) 2000 UNITS TABS [id number] Yes Take 1 tablet by mouth daily. [provider] Taking Active Self  diclofenac sodium (VOLTAREN) 1 % transdermal gel 4 g [id number]   Mohammed Kindle, MD  Active   diphenoxylate-atropine (LOMOTIL) 2.5-0.025 MG tablet [id number] Yes Take 1 tablet by mouth 4 (four) times daily as needed for diarrhea or loose stools. Crecencio Mc, MD Taking Active   esomeprazole (NEXIUM) 20 MG capsule [id number] Yes Take 10 mg by mouth daily. [provider] Taking Active Self  fluticasone (FLONASE) 50 MCG/ACT nasal spray [id number] Yes Place 2 sprays into both nostrils daily. [provider] Taking Active            Med Note (NWOGU, IVY A   Mon Oct 08, 2020  9:02 AM) Taking PRN  losartan  (COZAAR) 100 MG tablet [id number] Yes Take 100 mg by mouth daily. [provider] Taking Active            Med Note Darnelle Maffucci, Arville Lime   Wed Nov 14, 2020  9:10 AM)    midazolam (VERSED) 5 MG/5ML injection 5 mg [id number]   Mohammed Kindle, MD  Active   Multiple Vitamin (MULTIVITAMIN) tablet [id number] Yes Take 1 tablet by mouth daily. [provider] Taking Active Self  orphenadrine (NORFLEX) injection 60 mg [id number]   Mohammed Kindle,  MD  Active   sertraline (ZOLOFT) 100 MG tablet [id number] Yes TAKE 1 TABLET BY MOUTH EVERY DAY Crecencio Mc, MD Taking Active   sodium chloride 0.9 % injection 20 mL [id number]   Mohammed Kindle, MD  Active   tolterodine (DETROL) 2 MG tablet [id number] Yes TAKE 1 TABLET BY MOUTH TWICE A DAY Crecencio Mc, MD Taking Active   triamcinolone acetonide [facility]) injection 40 mg [id number]   Mohammed Kindle, MD  Active   Med List Note Landis Martins, RN 12/31/15 1040): UDS 09-20-15 meds  Due 02-11-16 CS 11/30/2014            Patient Active Problem List   Diagnosis Date Noted   Osteoporosis 11/17/2020   LBBB (left bundle branch block) 04/24/2020   Depression, major, single episode, mild ([place]) 08/27/2017   S/P total abdominal hysterectomy 06/12/2016   Bladder prolapse, female, acquired 06/12/2016   Bilateral occipital neuralgia 11/02/2014   Cervical facet syndrome 11/02/2014   Facet syndrome, lumbar 11/02/2014   Chronic pain syndrome 10/18/2014   DDD (degenerative disc disease), lumbar 10/03/2014   DDD (degenerative disc disease), cervical 10/03/2014   Gout of big toe 08/02/2014   History of shingles 12/07/2013   Biventricular cardiac pacemaker in situ 12/07/2013   Cervical stenosis of spine 05/11/2013   Erythema migrans (Lyme disease) 12/06/2012   Myalgia due to statin 03/03/2012   Reflux esophagitis 12/31/2011   Type 2 diabetes mellitus with hypertriglyceridemia ([place]) 09/27/2011   Obesity (BMI 30.0-34.9) 05/12/2011   Morton's  neuroma    Cardiomyopathy ([place]) 03/31/2011   Long QT syndrome 02/05/2011   Hypertension     Immunization History  Administered Date(s) Administered   Fluad Quad(high Dose 65+) 01/31/2020   Influenza Split 03/28/2014   Influenza,inj,Quad PF,6+ Mos 02/07/2013, 04/11/2015, 03/06/2016, 03/18/2017, 03/24/2018, 02/04/2019   PFIZER(Purple Top)SARS-COV-2 Vaccination 07/29/2019, 08/23/2019   Pneumococcal Conjugate-13 06/28/2014   Pneumococcal Polysaccharide-23 05/11/2013, 04/04/2020   Tdap 02/08/2008, 03/24/2018    Conditions to be addressed/monitored: HLD and DMII  Care Plan : Medication Management  Updates made by De Hollingshead, RPH-CPP since 02/06/2021 12:00 AM     Problem: T2DM, Depression/Anxiety, Hyperlipidemia, Chronic Pain, Gout      Long-Range Goal: Disease Progression Prevention   Recent Progress: On track  Priority: High  Note:   Current Barriers:  Unable to independently afford treatment regimen Suboptimal therapeutic regimen for ASCVD risk reduction  Pharmacist Clinical Goal(s):  Over the next 90 days, patient will verbalize ability to afford treatment regimen through collaboration with PharmD and provider.   Interventions: 1:1 collaboration with Crecencio Mc, MD regarding development and update of comprehensive plan of care as evidenced by provider attestation and co-signature Inter-disciplinary care team collaboration (see longitudinal plan of care) Comprehensive medication review performed; medication list updated in electronic medical record  Health Maintenance Yearly diabetic eye exam: up to date Yearly diabetic foot exam: up to date Urine microalbumin: up to date Yearly influenza vaccination: due - recommended seasonal dose Td/Tdap vaccination: up to date Pneumonia vaccination: up to date COVID vaccinations: due - recommended bivalent booster Shingrix vaccinations: due - will discuss moving forward Colonoscopy: up to date Bone density scan: up  to date - completed Mammogram: up to date  Diabetes: Controlled; current treatment: none; provided sample of Jardiance but patient hasn't started yet. Out of town for several things since our last visit so has not had a chance to start. Also worried about baseline risk of UTIs given genitourinary history.  No hx  metformin, though functional diarrhea at baseline Hx Ozempic - severe nausea, stomach upset.  Current glucose readings: fastings 100-120s;  Reports that after reading the side effects of Jardiance, she does not want to start it at this time. Prefers to hold off on adjustments until next appointment with PCP.   Hypertension, Cardiomyopathy; s/p pacer placement: Controlled per last office visit; current treatment: amlodipine 2.5 mg daily, losartan 100 mg daily, carvedilol 25 mg BID Hx of metoprolol - hair loss Home BP readings: 110-120s/70-80s Denies any lightheadedness, dizziness.  Recommended to continue current treatment with home monitoring in collaboration with [name] (EP) and Dr. [name] (cardiology).  Hyperlipidemia: Uncontrolled; current treatment: none Medications previously tried: atorvastatin; reported myalgias. Ezetimibe, does not remember reason for discontinuation. Reports family history of statin intolerance.  Current dietary patterns: tries to eat lean meats, avoiding saturated fats. Already avoid corn oils d/t association with diarrhea Again provided education on benefit of statins in DM and lipid lowering goals. Consider trial of rosuvastatin or pravastatin with alternate day dosing to determine tolerability. Patient agrees to discuss with PCP at upcoming visit.   Depression/Anxiety Moderately well controlled; current treatment: sertraline 100 mg daily, bupropion XL 150 mg daily  Previously recommended to continue current regimen at this time  Overactive Bladder: Well controlled per patient report; current regimen: tolterodine LA 2 mg BID Previously  recommended to continue current regimen at this time  Rosacea: Controlled per patient report;; current regimen; azelaic acid 1%, follows w/ Dr. Aubery Lapping.  Previously recommended to continue current regimen at this time.  GERD/IBS: Diarrhea uncontrolled; esomeprazole 20 mg daily PRN, lomotil PRN - reports usually needing 2 doses (4 hours apart) when she is having diarrhea.  Recommended to continue current regimen at this time  Allergies: Controlled per patient report; current regimen: fluticasone nasal spray BID PRN.  Previously recommended to continue current regimen at this time  Patient Goals/Self-Care Activities Over the next 90 days, patient will:  - take medications as prescribed collaborate with provider on medication access solutions  Follow Up Plan: Telephone follow up appointment with care management team member scheduled for:  ~12 weeks      Medication Assistance: None required.  Patient affirms current coverage meets needs.  Patient's preferred pharmacy is:  [id number] [place] [id number]: [phone]: [phone] Follow Up:  Patient agrees to Care Plan and Follow-up.  Plan: Telephone follow up appointment with care management team member scheduled for:  12 weeks  Catie TDarnelle Maffucci PharmD, [place] CVanderClinical Pharmacist LOccidental Pet[phone]

## 2021-02-06 NOTE — Patient Instructions (Addendum)
Ms. Preston,   It was great talking with you today!  Our goal is to keep your A1c less than 7%. This corresponds with fasting sugars less than 130 and 2 hour post meal sugars less than 180. Continue to monitor at home and we will see what your upcoming lab work shows.   I do encourage you to talk with Dr. Derrel Nip about cholesterol treatment moving forward. We recommend that all patients with diabetes be on a statin medication to help lower cholesterol AND reduce your risk of heart attacks or strokes. There are several other statins that can be tried besides rosuvastatin.   Take care!  Catie Darnelle Maffucci, PharmD   Visit Information  PATIENT GOALS:  Goals Addressed               This Visit's Progress     Patient Stated     Medication Monitoring (pt-stated)        Patient Goals/Self-Care Activities Over the next 90 days, patient will:  - take medications as prescribed - collaborate with provider on medication access solutions.         Patient verbalizes understanding of instructions provided today and agrees to view in [place].    Plan: Telephone follow up appointment with care management team member scheduled for:  12 weeks  Catie Darnelle Maffucci, PharmD, [place], [place] Clinical Pharmacist [employer] at [employer] [phone]

## 2021-02-18 ENCOUNTER — Other Ambulatory Visit (INDEPENDENT_AMBULATORY_CARE_PROVIDER_SITE_OTHER): Payer: HMO

## 2021-02-18 ENCOUNTER — Other Ambulatory Visit: Payer: Self-pay

## 2021-02-18 DIAGNOSIS — E1169 Type 2 diabetes mellitus with other specified complication: Secondary | ICD-10-CM | POA: Diagnosis not present

## 2021-02-18 DIAGNOSIS — I10 Essential (primary) hypertension: Secondary | ICD-10-CM

## 2021-02-18 DIAGNOSIS — E781 Pure hyperglyceridemia: Secondary | ICD-10-CM | POA: Diagnosis not present

## 2021-02-18 LAB — LIPID PANEL
Cholesterol: 222 mg/dL — ABNORMAL HIGH (ref 0–200)
HDL: 36 mg/dL — ABNORMAL LOW (ref >39.00)
Total CHOL/HDL Ratio: 6
Triglycerides: 486 mg/dL — ABNORMAL HIGH (ref 0.0–149.0)

## 2021-02-18 LAB — COMPREHENSIVE METABOLIC PANEL
ALT: 12 U/L (ref 0–35)
AST: 18 U/L (ref 0–37)
Albumin: 4.3 g/dL (ref 3.5–5.2)
Alkaline Phosphatase: 92 U/L (ref 39–117)
BUN: 14 mg/dL (ref 6–23)
CO2: 29 mEq/L (ref 19–32)
Calcium: 9.3 mg/dL (ref 8.4–10.5)
Chloride: 104 mEq/L (ref 96–112)
Creatinine, Ser: 0.69 mg/dL (ref 0.40–1.20)
GFR: 90.34 mL/min (ref >60.00)
Glucose, Bld: 111 mg/dL — ABNORMAL HIGH (ref 70–99)
Potassium: 4.1 mEq/L (ref 3.5–5.1)
Sodium: 142 mEq/L (ref 135–145)
Total Bilirubin: 0.4 mg/dL (ref 0.2–1.2)
Total Protein: 6.9 g/dL (ref 6.0–8.3)

## 2021-02-18 LAB — HEMOGLOBIN A1C: Hgb A1c MFr Bld: 6.1 % (ref 4.6–6.5)

## 2021-02-18 LAB — LDL CHOLESTEROL, DIRECT: Direct LDL: 120 mg/dL

## 2021-02-20 ENCOUNTER — Telehealth (INDEPENDENT_AMBULATORY_CARE_PROVIDER_SITE_OTHER): Payer: HMO | Admitting: Internal Medicine

## 2021-02-20 ENCOUNTER — Encounter: Payer: Self-pay | Admitting: Internal Medicine

## 2021-02-20 VITALS — BP 120/80 | Ht 62.5 in | Wt 156.0 lb

## 2021-02-20 DIAGNOSIS — M791 Myalgia, unspecified site: Secondary | ICD-10-CM

## 2021-02-20 DIAGNOSIS — N3281 Overactive bladder: Secondary | ICD-10-CM

## 2021-02-20 DIAGNOSIS — K580 Irritable bowel syndrome with diarrhea: Secondary | ICD-10-CM | POA: Diagnosis not present

## 2021-02-20 DIAGNOSIS — N819 Female genital prolapse, unspecified: Secondary | ICD-10-CM

## 2021-02-20 DIAGNOSIS — E781 Pure hyperglyceridemia: Secondary | ICD-10-CM | POA: Diagnosis not present

## 2021-02-20 DIAGNOSIS — E1169 Type 2 diabetes mellitus with other specified complication: Secondary | ICD-10-CM | POA: Diagnosis not present

## 2021-02-20 DIAGNOSIS — I700 Atherosclerosis of aorta: Secondary | ICD-10-CM

## 2021-02-20 DIAGNOSIS — K589 Irritable bowel syndrome without diarrhea: Secondary | ICD-10-CM | POA: Insufficient documentation

## 2021-02-20 DIAGNOSIS — R351 Nocturia: Secondary | ICD-10-CM | POA: Diagnosis not present

## 2021-02-20 DIAGNOSIS — T466X5A Adverse effect of antihyperlipidemic and antiarteriosclerotic drugs, initial encounter: Secondary | ICD-10-CM

## 2021-02-20 MED ORDER — DIPHENOXYLATE-ATROPINE 2.5-0.025 MG PO TABS: 1.0000 | ORAL_TABLET | Freq: Four times a day (QID) | ORAL | 1 refills | Status: DC | PRN

## 2021-02-20 NOTE — Assessment & Plan Note (Addendum)
Suggested by GI after diagnostic  colonoscopy in August was negative for colitis . Has no cramping . Advised to keep a food diary and use lomotil prn

## 2021-02-20 NOTE — Progress Notes (Addendum)
Virtual Visit via [place] Note  This visit type was conducted due to national recommendations for restrictions regarding the COVID-19 pandemic (e.g. social distancing).  This format is felt to be most appropriate for this patient at this time.  All issues noted in this document were discussed and addressed.  No physical exam was performed (except for noted visual exam findings with Video Visits).   I connected withNAME@ on 02/20/21 at 10:00 AM EDT by a video enabled telemedicine application and verified that I am speaking with the correct person using two identifiers. Location patient: home Location provider: work or home office Persons participating in the virtual visit: patient, provider  I discussed the limitations, risks, security and privacy concerns of performing an evaluation and management service by telephone and the availability of in person appointments. I also discussed with the patient that there may be a patient responsible charge related to this service. The patient expressed understanding and agreed to proceed.   Reason for visit:  follow up on type 2 DM  ,  diarrhea ,  colonoscopy  HPI:  Diabetes:   sugars usually under  130   does not want to start Jardiance or use a CBG monitor . Has chronic urinary urgency /OAB  Aortic atherosclerosis :  Reviewed findings of prior CT scan today..  Patient is statin intolerant due to severe myalgias and unwilling to  Initiate statin therapy      Nocturia x 3-4 per night  worse since cystocele has worsened.  .  Has reduced water intake in the evening , no green tea,  taking  detrol  prescribed . Feels a bulge at introitus   Diarrhea :  unpredictable,  using Lomotil  3 /week and beano before meals .  Colonoscopy reviewed with patient.   HTN"  Patient is taking her medications as prescribed and notes no adverse effects.  Home BP readings have been done about once per week and are  generally < 130/80 .  She is avoiding added salt in her diet  and walking regularly about 3 times per week for exercise  .    ROS: See pertinent positives and negatives per HPI.  Past Medical History:  Diagnosis Date   Anxiety    Arthritis    Cardiac arrhythmia due to congenital heart disease    Chicken pox    Chronic systolic heart failure ([place]) 09/13/2013   EF now 55%  Bu June 2019   ECHO   Depression    Diabetes ([place])    Fibromyalgia    Headache, frequent episodic tension-type    High cholesterol    History of high blood pressure    readings   Holter monitor, abnormal 11/2010   done for long QT.  some contractile asynchrony [name])   Hx of colonoscopy 12/2010   normal,  next due 2022, [name]   Hyperlipidemia    Hypertension    Morton's neuroma    bilateral   Pacemaker    3 wire w/o difibrulator   Syncope     Past Surgical History:  Procedure Laterality Date   ABDOMINAL HYSTERECTOMY  Dec 2013   [place]   APPENDECTOMY  1973   BI-VENTRICULAR PACEMAKER INSERTION (CRT-P)  05/2013   MDT CRTP implanted by Dr Lovena Le for cardiomyopathy, LBBB, and syncope   HYSTEROSCOPY  2005   heavy bleeding   IMPLANTABLE CARDIOVERTER DEFIBRILLATOR IMPLANT N/A 06/02/2013   Procedure: IMPLANTABLE CARDIOVERTER DEFIBRILLATOR IMPLANT;  Surgeon: Evans Lance, MD;  [facility] CATH  LAB;  Service: Cardiovascular;  Laterality: N/A;   SPINAL FUSION  03/2007   ruptured disc  L5  [name]   TONSILLECTOMY AND ADENOIDECTOMY  1966    Family History  Problem Relation Age of Onset   Arthritis Mother    Hyperlipidemia Mother    Cirrhosis Mother    Arthritis Father    Cancer Father        prostate cancer    Hyperlipidemia Father    Pulmonary embolism Father 58       blood clots   Alzheimer's disease Father    Diabetes Brother    Coronary artery disease Paternal Grandfather 32   Diabetes Maternal Uncle    Cancer Other        ovarian,uterus   Heart disease Other    Stroke Other    Learning disabilities Other     SOCIAL HX:  reports that she has  never smoked. She has never used smokeless tobacco. She reports that she does not drink alcohol and does not use drugs.    Current Outpatient Medications:    Alpha-D-Galactosidase (BEANO PO), Take by mouth. Before meals, Disp: , Rfl:    amLODipine (NORVASC) 2.5 MG tablet, TAKE 1 TABLET BY MOUTH EVERY DAY, Disp: 90 tablet, Rfl: 3   Azelaic Acid 15 % cream, APPLY TO AFFECTED AREA EVERY DAY, Disp: , Rfl:    B Complex Vitamins (B COMPLEX PO), Take 1 tablet by mouth daily. , Disp: , Rfl:    blood glucose meter kit and supplies, Dispense based on patient and insurance preference. Use to check blood sugar once daily. (FOR ICD-10 E11.9)., Disp: 1 each, Rfl: 0   buPROPion (WELLBUTRIN XL) 150 MG 24 hr tablet, TAKE 1 TABLET BY MOUTH EVERY DAY, Disp: 90 tablet, Rfl: 1   calcium elemental as carbonate (BARIATRIC TUMS ULTRA) 400 MG chewable tablet, Chew 400 mg by mouth daily., Disp: , Rfl:    carvedilol (COREG) 25 MG tablet, Take 1 tablet (25 mg total) by mouth 2 (two) times daily., Disp: 180 tablet, Rfl: 3   Cholecalciferol (VITAMIN D3) 2000 UNITS TABS, Take 1 tablet by mouth daily., Disp: , Rfl:    esomeprazole (NEXIUM) 20 MG capsule, Take 10 mg by mouth daily., Disp: , Rfl:    fluticasone (FLONASE) 50 MCG/ACT nasal spray, Place 2 sprays into both nostrils daily., Disp: , Rfl:    losartan (COZAAR) 100 MG tablet, Take 100 mg by mouth daily., Disp: , Rfl:    Multiple Vitamin (MULTIVITAMIN) tablet, Take 1 tablet by mouth daily., Disp: , Rfl:    sertraline (ZOLOFT) 100 MG tablet, TAKE 1 TABLET BY MOUTH EVERY DAY, Disp: 90 tablet, Rfl: 1   tolterodine (DETROL) 2 MG tablet, TAKE 1 TABLET BY MOUTH TWICE A DAY, Disp: 180 tablet, Rfl: 3   Accu-Chek FastClix Lancets MISC, TEST ONCE DAILY E11.9, Disp: 102 each, Rfl: 1   ACCU-CHEK GUIDE test strip, TEST ONCE DAILY E11.9, Disp: 100 strip, Rfl: 1   diphenoxylate-atropine (LOMOTIL) 2.5-0.025 MG tablet, Take 1 tablet by mouth 4 (four) times daily as needed for diarrhea or  loose stools., Disp: 30 tablet, Rfl: 1  Current Facility-Administered Medications:    diclofenac sodium (VOLTAREN) 1 % transdermal gel 4 g, 4 g, Topical, QID, Mohammed Kindle, MD   midazolam (VERSED) 5 MG/5ML injection 5 mg, 5 mg, Intravenous, Once, Mohammed Kindle, MD   orphenadrine (NORFLEX) injection 60 mg, 60 mg, Intramuscular, Once, Mohammed Kindle, MD   sodium chloride 0.9 % injection 20  mL, 20 mL, Other, Once, Mohammed Kindle, MD   triamcinolone acetonide (KENALOG-40) injection 40 mg, 40 mg, Other, Once, Mohammed Kindle, MD  EXAM:  VITALS per patient if applicable:  GENERAL: alert, oriented, appears well and in no acute distress  HEENT: atraumatic, conjunttiva clear, no obvious abnormalities on inspection of external nose and ears  NECK: normal movements of the head and neck  LUNGS: on inspection no signs of respiratory distress, breathing rate appears normal, no obvious gross SOB, gasping or wheezing  CV: no obvious cyanosis  MS: moves all visible extremities without noticeable abnormality  PSYCH/NEURO: pleasant and cooperative, no obvious depression or anxiety, speech and thought processing grossly intact  ASSESSMENT AND PLAN:  Discussed the following assessment and plan:  Nocturia - Plan: Urinalysis, Routine w reflex microscopic, Urine Culture  Irritable bowel syndrome with diarrhea  Hypertriglyceridemia  Overactive bladder due to prolapse of female genital organ  Type 2 diabetes mellitus with hypertriglyceridemia (HCC)  Abdominal aortic atherosclerosis (HCC)  Myalgia due to statin  IBS (irritable bowel syndrome) Suggested by GI after diagnostic  colonoscopy in August was negative for colitis . Has no cramping . Advised to keep a food diary and use lomotil prn   Hypertriglyceridemia Untreated due to Fenofibrate intolerant due to myalgias.  Not exercising currently    Overactive bladder due to prolapse of female genital organ Wants to continue detrol. Feeling  a bulge,  Advised to see gyn for a pessary   Type 2 diabetes mellitus with hypertriglyceridemia ([place]) Defers jardiance and CBG. a1c is stable.  Statin intolerant.  Checking sugars 1-2 times daily   Abdominal aortic atherosclerosis (HCC)  Aortic atherosclerosis :  Discussed need for statin therapy given documented evidence of moderate  atherosclerosis in the aorta noted on recent  CT of abdomen and  pelvis and the prognostic implications of this finding. She is statin intolerant due to myalgias   Myalgia due to statin She has documented intolerance to statins and fenofibrates due to recurrent severe myalgias.  Despite her diagnosis of T2DM and nonischemic CM and has  evidence of atherosclerosis on prior imaging.  She  declines to try once weekly statin     I discussed the assessment and treatment plan with the patient. The patient was provided an opportunity to ask questions and all were answered. The patient agreed with the plan and demonstrated an understanding of the instructions.   The patient was advised to call back or seek an in-person evaluation if the symptoms worsen or if the condition fails to improve as anticipated.   I spent 30 minutes dedicated to the care of this patient on the date of this encounter to include pre-visit review of his medical history,  Face-to-face time with the patient , and post visit ordering of testing and therapeutics.    Crecencio Mc, MD

## 2021-02-20 NOTE — Assessment & Plan Note (Signed)
Wants to continue detrol. Feeling a bulge,  Advised to see gyn for a pessary

## 2021-02-20 NOTE — Assessment & Plan Note (Signed)
Untreated due to Fenofibrate intolerant due to myalgias.  Not exercising currently

## 2021-02-20 NOTE — Assessment & Plan Note (Signed)
  Aortic atherosclerosis :  Discussed need for statin therapy given documented evidence of moderate  atherosclerosis in the aorta noted on recent  CT of abdomen and  pelvis and the prognostic implications of this finding. She is statin intolerant due to myalgias

## 2021-02-20 NOTE — Assessment & Plan Note (Signed)
Defers jardiance and CBG. a1c is stable.  Statin intolerant.  Checking sugars 1-2 times daily

## 2021-02-20 NOTE — Assessment & Plan Note (Signed)
She has documented intolerance to statins and fenofibrates due to recurrent severe myalgias.  Despite her diagnosis of T2DM and nonischemic CM and has  evidence of atherosclerosis on prior imaging.  She  declines to try once weekly statin

## 2021-03-08 ENCOUNTER — Other Ambulatory Visit: Payer: Self-pay

## 2021-03-08 ENCOUNTER — Other Ambulatory Visit: Payer: HMO

## 2021-03-08 DIAGNOSIS — R351 Nocturia: Secondary | ICD-10-CM | POA: Diagnosis not present

## 2021-03-08 NOTE — Addendum Note (Signed)
Addended by: Leeanne Rio on: 03/08/2021 02:21 PM   Modules accepted: Orders

## 2021-03-09 LAB — URINALYSIS, ROUTINE W REFLEX MICROSCOPIC
Bilirubin Urine: NEGATIVE
Glucose, UA: NEGATIVE
Hgb urine dipstick: NEGATIVE
Ketones, ur: NEGATIVE
Leukocytes,Ua: NEGATIVE
Nitrite: NEGATIVE
Protein, ur: NEGATIVE
Specific Gravity, Urine: 1.019 (ref 1.001–1.035)
pH: 6 (ref 5.0–8.0)

## 2021-03-09 LAB — URINE CULTURE
MICRO NUMBER:: 12657393
Result:: NO GROWTH
SPECIMEN QUALITY:: ADEQUATE

## 2021-03-18 DIAGNOSIS — N8110 Cystocele, unspecified: Secondary | ICD-10-CM | POA: Diagnosis not present

## 2021-03-18 DIAGNOSIS — Z4689 Encounter for fitting and adjustment of other specified devices: Secondary | ICD-10-CM | POA: Diagnosis not present

## 2021-03-18 DIAGNOSIS — N816 Rectocele: Secondary | ICD-10-CM | POA: Diagnosis not present

## 2021-03-18 DIAGNOSIS — Z466 Encounter for fitting and adjustment of urinary device: Secondary | ICD-10-CM | POA: Diagnosis not present

## 2021-03-19 ENCOUNTER — Telehealth: Payer: Self-pay

## 2021-03-19 ENCOUNTER — Encounter: Payer: Self-pay | Admitting: Internal Medicine

## 2021-03-19 NOTE — Telephone Encounter (Signed)
Spoke with pt and scheduled her for a virtual visit tomorrow with Sharyn Lull, NP at 11:30 am.

## 2021-03-19 NOTE — Telephone Encounter (Signed)
I used a home test about an hour ago and it showed I was positive.  It was an at home test.  After taking it I noticed that the date had expired. It did show positive.  Should I have another one done that isn't at home?  My head feels like it is heavy and feels kind of woozey.  No fever but do have a headache.  Coughed some last night but it felt like the dry cough I have off and on. Nothing was coughed up.   Thanks

## 2021-03-20 ENCOUNTER — Encounter: Payer: Self-pay | Admitting: Adult Health

## 2021-03-20 ENCOUNTER — Ambulatory Visit (INDEPENDENT_AMBULATORY_CARE_PROVIDER_SITE_OTHER): Payer: HMO

## 2021-03-20 ENCOUNTER — Telehealth (INDEPENDENT_AMBULATORY_CARE_PROVIDER_SITE_OTHER): Payer: HMO | Admitting: Adult Health

## 2021-03-20 ENCOUNTER — Other Ambulatory Visit: Payer: Self-pay

## 2021-03-20 VITALS — BP 132/74 | HR 74 | Temp 100.6°F

## 2021-03-20 DIAGNOSIS — U071 COVID-19: Secondary | ICD-10-CM | POA: Diagnosis not present

## 2021-03-20 DIAGNOSIS — J069 Acute upper respiratory infection, unspecified: Secondary | ICD-10-CM | POA: Insufficient documentation

## 2021-03-20 DIAGNOSIS — I428 Other cardiomyopathies: Secondary | ICD-10-CM

## 2021-03-20 LAB — CUP PACEART REMOTE DEVICE CHECK
Battery Remaining Longevity: 10 mo
Battery Voltage: 2.87 V
Brady Statistic AP VP Percent: 0.09 %
Brady Statistic AP VS Percent: 0 %
Brady Statistic AS VP Percent: 99.9 %
Brady Statistic AS VS Percent: 0 %
Brady Statistic RA Percent Paced: 0.09 %
Brady Statistic RV Percent Paced: 99.99 %
Date Time Interrogation Session: 20221130143430
Implantable Lead Implant Date: 20150212
Implantable Lead Implant Date: 20150212
Implantable Lead Implant Date: 20150212
Implantable Lead Location: 753858
Implantable Lead Location: 753859
Implantable Lead Location: 753860
Implantable Lead Model: 4194
Implantable Lead Model: 5076
Implantable Lead Model: 5076
Implantable Pulse Generator Implant Date: 20150212
Lead Channel Impedance Value: 323 Ohm
Lead Channel Impedance Value: 342 Ohm
Lead Channel Impedance Value: 361 Ohm
Lead Channel Impedance Value: 380 Ohm
Lead Channel Impedance Value: 418 Ohm
Lead Channel Impedance Value: 437 Ohm
Lead Channel Impedance Value: 456 Ohm
Lead Channel Impedance Value: 513 Ohm
Lead Channel Impedance Value: 532 Ohm
Lead Channel Pacing Threshold Amplitude: 0.75 V
Lead Channel Pacing Threshold Amplitude: 1.125 V
Lead Channel Pacing Threshold Amplitude: 1.875 V
Lead Channel Pacing Threshold Pulse Width: 0.4 ms
Lead Channel Pacing Threshold Pulse Width: 0.4 ms
Lead Channel Pacing Threshold Pulse Width: 0.4 ms
Lead Channel Sensing Intrinsic Amplitude: 1.25 mV
Lead Channel Sensing Intrinsic Amplitude: 1.25 mV
Lead Channel Sensing Intrinsic Amplitude: 19.25 mV
Lead Channel Sensing Intrinsic Amplitude: 19.25 mV
Lead Channel Setting Pacing Amplitude: 1.5 V
Lead Channel Setting Pacing Amplitude: 2.25 V
Lead Channel Setting Pacing Amplitude: 2.5 V
Lead Channel Setting Pacing Pulse Width: 0.4 ms
Lead Channel Setting Pacing Pulse Width: 0.8 ms
Lead Channel Setting Sensing Sensitivity: 0.9 mV

## 2021-03-20 MED ORDER — MOLNUPIRAVIR EUA 200MG CAPSULE: 4.0000 | ORAL_CAPSULE | Freq: Two times a day (BID) | ORAL | 0 refills | Status: AC

## 2021-03-20 NOTE — Patient Instructions (Signed)
COVID-19: What to Do if You Are Sick If you test positive and are an older adult or someone who is at high risk of getting very sick from COVID-19, treatment may be available. Contact a healthcare provider right away after a positive test to determine if you are eligible, even if your symptoms are mild right now. You can also visit a Test to Treat location and, if eligible, receive a prescription from a provider. Don't delay: Treatment must be started within the first few days to be effective. If you have a fever, cough, or other symptoms, you might have COVID-19. Most people have mild illness and are able to recover at home. If you are sick: Keep track of your symptoms. If you have an emergency warning sign (including trouble breathing), call 911. Steps to help prevent the spread of COVID-19 if you are sick If you are sick with COVID-19 or think you might have COVID-19, follow the steps below to care for yourself and to help protect other people in your home and community. Stay home except to get medical care Stay home. Most people with COVID-19 have mild illness and can recover at home without medical care. Do not leave your home, except to get medical care. Do not visit public areas and do not go to places where you are unable to wear a mask. Take care of yourself. Get rest and stay hydrated. Take over-the-counter medicines, such as acetaminophen, to help you feel better. Stay in touch with your doctor. Call before you get medical care. Be sure to get care if you have trouble breathing, or have any other emergency warning signs, or if you think it is an emergency. Avoid public transportation, ride-sharing, or taxis if possible. Get tested If you have symptoms of COVID-19, get tested. While waiting for test results, stay away from others, including staying apart from those living in your household. Get tested as soon as possible after your symptoms start. Treatments may be available for people with  COVID-19 who are at risk for becoming very sick. Don't delay: Treatment must be started early to be effective--some treatments must begin within 5 days of your first symptoms. Contact your healthcare provider right away if your test result is positive to determine if you are eligible. Self-tests are one of several options for testing for the virus that causes COVID-19 and may be more convenient than laboratory-based tests and point-of-care tests. Ask your healthcare provider or your local health department if you need help interpreting your test results. You can visit your state, tribal, local, and territorial health department's website to look for the latest local information on testing sites. Separate yourself from other people As much as possible, stay in a specific room and away from other people and pets in your home. If possible, you should use a separate bathroom. If you need to be around other people or animals in or outside of the home, wear a well-fitting mask. Tell your close contacts that they may have been exposed to COVID-19. An infected person can spread COVID-19 starting 48 hours (or 2 days) before the person has any symptoms or tests positive. By letting your close contacts know they may have been exposed to COVID-19, you are helping to protect everyone. See COVID-19 and Animals if you have questions about pets. If you are diagnosed with COVID-19, someone from the health department may call you. Answer the call to slow the spread. Monitor your symptoms Symptoms of COVID-19 include fever, cough, or other   symptoms. Follow care instructions from your healthcare provider and local health department. Your local health authorities may give instructions on checking your symptoms and reporting information. When to seek emergency medical attention Look for emergency warning signs* for COVID-19. If someone is showing any of these signs, seek emergency medical care immediately: Trouble  breathing Persistent pain or pressure in the chest New confusion Inability to wake or stay awake Pale, gray, or blue-colored skin, lips, or nail beds, depending on skin tone *This list is not all possible symptoms. Please call your medical provider for any other symptoms that are severe or concerning to you. Call 911 or call ahead to your local emergency facility: Notify the operator that you are seeking care for someone who has or may have COVID-19. Call ahead before visiting your doctor Call ahead. Many medical visits for routine care are being postponed or done by phone or telemedicine. If you have a medical appointment that cannot be postponed, call your doctor's office, and tell them you have or may have COVID-19. This will help the office protect themselves and other patients. If you are sick, wear a well-fitting mask You should wear a mask if you must be around other people or animals, including pets (even at home). Wear a mask with the best fit, protection, and comfort for you. You don't need to wear the mask if you are alone. If you can't put on a mask (because of trouble breathing, for example), cover your coughs and sneezes in some other way. Try to stay at least 6 feet away from other people. This will help protect the people around you. Masks should not be placed on young children under age 2 years, anyone who has trouble breathing, or anyone who is not able to remove the mask without help. Cover your coughs and sneezes Cover your mouth and nose with a tissue when you cough or sneeze. Throw away used tissues in a lined trash can. Immediately wash your hands with soap and water for at least 20 seconds. If soap and water are not available, clean your hands with an alcohol-based hand sanitizer that contains at least 60% alcohol. Clean your hands often Wash your hands often with soap and water for at least 20 seconds. This is especially important after blowing your nose, coughing, or  sneezing; going to the bathroom; and before eating or preparing food. Use hand sanitizer if soap and water are not available. Use an alcohol-based hand sanitizer with at least 60% alcohol, covering all surfaces of your hands and rubbing them together until they feel dry. Soap and water are the best option, especially if hands are visibly dirty. Avoid touching your eyes, nose, and mouth with unwashed hands. Handwashing Tips Avoid sharing personal household items Do not share dishes, drinking glasses, cups, eating utensils, towels, or bedding with other people in your home. Wash these items thoroughly after using them with soap and water or put in the dishwasher. Clean surfaces in your home regularly Clean and disinfect high-touch surfaces (for example, doorknobs, tables, handles, light switches, and countertops) in your "sick room" and bathroom. In shared spaces, you should clean and disinfect surfaces and items after each use by the person who is ill. If you are sick and cannot clean, a caregiver or other person should only clean and disinfect the area around you (such as your bedroom and bathroom) on an as needed basis. Your caregiver/other person should wait as long as possible (at least several hours) and wear a   mask before entering, cleaning, and disinfecting shared spaces that you use. Clean and disinfect areas that may have blood, stool, or body fluids on them. Use household cleaners and disinfectants. Clean visible dirty surfaces with household cleaners containing soap or detergent. Then, use a household disinfectant. Use a product from [employer] List N: Disinfectants for Coronavirus (BJYNW-29). Be sure to follow the instructions on the label to ensure safe and effective use of the product. Many products recommend keeping the surface wet with a disinfectant for a certain period of time (look at "contact time" on the product label). You may also need to wear personal protective equipment, such as  gloves, depending on the directions on the product label. Immediately after disinfecting, wash your hands with soap and water for 20 seconds. For completed guidance on cleaning and disinfecting your home, visit Complete Disinfection Guidance. Take steps to improve ventilation at home Improve ventilation (air flow) at home to help prevent from spreading COVID-19 to other people in your household. Clear out COVID-19 virus particles in the air by opening windows, using air filters, and turning on fans in your home. Use this interactive tool to learn how to improve air flow in your home. When you can be around others after being sick with COVID-19 Deciding when you can be around others is different for different situations. Find out when you can safely end home isolation. For any additional questions about your care, contact your healthcare provider or state or local health department. 07/10/2020 Content source: [employer] ([facility]), Division of Viral Diseases This information is not intended to replace advice given to you by your health care provider. Make sure you discuss any questions you have with your health care provider. Document Revised: 12/28/2020 Document Reviewed: 12/28/2020 Elsevier Patient Education  2022 [place] Oral Capsules What is this medication? MOLNUPIRAVIR (mol nue pir a vir) treats COVID-19. It is an antiviral medication. It may decrease the risk of developing severe symptoms of COVID-19. It may also decrease the chance of going to the hospital. This medication is not approved by the FDA. The FDA has authorized emergency use of this medication during the COVID-19 pandemic. This medicine may be used for other purposes; ask your health care provider or pharmacist if you have questions. COMMON BRAND NAME(S): LAGEVRIO What should I tell my care team before I take this medication? They need to know if you have any of  these conditions: Any allergies Any serious illness An unusual or allergic reaction to molnupiravir, other medications, foods, dyes, or preservatives Pregnant or trying to get pregnant Breast-feeding How should I use this medication? Take this medication by mouth with water. Take it as directed on the prescription label at the same time every day. Do not cut, crush or chew this medication. Swallow the capsules whole. You can take it with or without food. If it upsets your stomach, take it with food. Take all of this medication unless your care team tells you to stop it early. Keep taking it even if you think you are better. Talk to your care team about the use of this medication in children. Special care may be needed. Overdosage: If you think you have taken too much of this medicine contact a poison control center or emergency room at once. NOTE: This medicine is only for you. Do not share this medicine with others. What if I miss a dose? If you miss a dose, take it as soon as you can  unless it is more than 10 hours late. If it is more than 10 hours late, skip the missed dose. Take the next dose at the normal time. Do not take extra or 2 doses at the same time to make up for the missed dose. What may interact with this medication? Interactions have not been studied. This list may not describe all possible interactions. Give your health care provider a list of all the medicines, herbs, non-prescription drugs, or dietary supplements you use. Also tell them if you smoke, drink alcohol, or use illegal drugs. Some items may interact with your medicine. What should I watch for while using this medication? Your condition will be monitored carefully while you are receiving this medication. Visit your care team for regular checkups. Tell your care team if your symptoms do not start to get better or if they get worse. Do not become pregnant while taking this medication. You may need a pregnancy test before  starting this medication. Women must use a reliable form of birth control while taking this medication and for 4 days after stopping the medication. Women should inform their care team if they wish to become pregnant or think they might be pregnant. Men should not father a child while taking this medication and for 3 months after stopping it. There is potential for serious harm to an unborn child. Talk to your care team for more information. Do not breast-feed an infant while taking this medication and for 4 days after stopping the medication. What side effects may I notice from receiving this medication? Side effects that you should report to your care team as soon as possible: Allergic reactions--skin rash, itching, hives, swelling of the face, lips, tongue, or throat Side effects that usually do not require medical attention (report these to your care team if they continue or are bothersome): Diarrhea Dizziness Nausea This list may not describe all possible side effects. Call your doctor for medical advice about side effects. You may report side effects to FDA at 1-800-FDA-1088. Where should I keep my medication? Keep out of the reach of children and pets. Store at room temperature between 20 and 25 degrees C (68 and 77 degrees F). Get rid of any unused medication after the expiration date. To get rid of medications that are no longer needed or have expired: Take the medication to a medication take-back program. Check with your pharmacy or law enforcement to find a location. If you cannot return the medication, check the label or package insert to see if the medication should be thrown out in the garbage or flushed down the toilet. If you are not sure, ask your care team. If it is safe to put it in the trash, take the medication out of the container. Mix the medication with cat litter, dirt, coffee grounds, or other unwanted substance. Seal the mixture in a bag or container. Put it in the  trash. NOTE: This sheet is a summary. It may not cover all possible information. If you have questions about this medicine, talk to your doctor, pharmacist, or health care provider.  2022 Elsevier/Gold Standard (2020-04-16 00:00:00)

## 2021-03-20 NOTE — Progress Notes (Signed)
Virtual Visit via Video Note  I connected with Julia Pierce on 03/20/21 at 11:30 AM EST by a video enabled telemedicine application and verified that I am speaking with the correct person using two identifiers.  Location: Patient: at home   Provider: Provider: Provider's office at  [facility], [place].      I discussed the limitations of evaluation and management by telemedicine and the availability of in person appointments. The patient expressed understanding and agreed to proceed.  History of Present Illness: Patient started with respiratory symptoms on 03/18/21 and tested positive for Covid at home on 03/19/21. Symptoms include generalized headache  sore throat, sinus pressure, nasal congestion. Non productive post nasal drip cough. Mild dizziness. She increased hydration and feels better with this.  Temperature is 100.6 today. This is highest and no antipyretics.   denies any chest  congestion. She has hoarse voice this morning that improved with warm fluids.  Denies any ear pain.  Headache has been relieved by ibuprofen  Patient  denies any fever, body aches,chills, rash, chest pain, shortness of breath, nausea, vomiting, or diarrhea.   Observations/Objective:   Patient is alert and oriented and responsive to questions Engages in conversation with provider. Speaks in full sentences without any pauses without any shortness of breath or distress.    Assessment and Plan:  Viral upper respiratory tract infection  Lab test positive for detection of COVID-19 virus - Plan: molnupiravir EUA (LAGEVRIO) 200 mg CAPS capsule  She is still within the parameters to give antiviral medication as above, however we did discuss the side effects that are common or diarrhea nausea and dizziness.  Patient already has concerns with diarrhea, however she reports it is getting better with the treatment from her PCP.  She will hold off on starting the antiviral drug  and see how she is feeling over the next day.  She reports she understands that she has to start it within 5 days if she does desire to take medication.  Otherwise she will try symptomatic management of her symptoms.  Follow Up Instructions: Return if symptoms worsen or fail to improve, for at any time for any worsening symptoms, Go to Emergency room/ urgent care if worse.    I discussed the assessment and treatment plan with the patient. The patient was provided an opportunity to ask questions and all were answered. The patient agreed with the plan and demonstrated an understanding of the instructions.   The patient was advised to call back or seek an in-person evaluation if the symptoms worsen or if the condition fails to improve as anticipated.  I provided 20 minutes of non-face-to-face time during this encounter.   Marcille Buffy, FNP

## 2021-03-21 ENCOUNTER — Ambulatory Visit (INDEPENDENT_AMBULATORY_CARE_PROVIDER_SITE_OTHER): Payer: HMO

## 2021-03-21 VITALS — Ht 62.5 in | Wt 156.0 lb

## 2021-03-21 DIAGNOSIS — Z0000 Encounter for general adult medical examination without abnormal findings: Secondary | ICD-10-CM

## 2021-03-21 NOTE — Patient Instructions (Addendum)
Julia Pierce , Thank you for taking time to come for your Medicare Wellness Visit. I appreciate your ongoing commitment to your health goals. Please review the following plan we discussed and let me know if I can assist you in the future.   These are the goals we discussed:  Goals       Patient Stated     Medication Monitoring (pt-stated)      Patient Goals/Self-Care Activities Over the next 90 days, patient will:  - take medications as prescribed - collaborate with provider on medication access solutions.       Other     Maintain Healthy Lifestyle      Stay active  Healthy diet Walk the treadmill         This is a list of the screening recommended for you and due dates:  Health Maintenance  Topic Date Due   COVID-19 Vaccine (3 - Pfizer risk series) 04/06/2021*   Zoster (Shingles) Vaccine (1 of 2) 06/19/2021*   Flu Shot  07/19/2021*   Mammogram  06/22/2021   Complete foot exam   08/15/2021   Hemoglobin A1C  08/18/2021   Eye exam for diabetics  10/19/2021   Colon Cancer Screening  12/01/2027   Tetanus Vaccine  03/24/2028   Pneumonia Vaccine  Completed   DEXA scan (bone density measurement)  Completed   Hepatitis C Screening: USPSTF Recommendation to screen - Ages 25-79 yo.  Completed   HPV Vaccine  Aged Out  *Topic was postponed. The date shown is not the original due date.    Advanced directives: not yet filed  Conditions/risks identified: none new  Follow up in one year for your annual wellness visit    Preventive Care 65 Years and Older, Female Preventive care refers to lifestyle choices and visits with your health care provider that can promote health and wellness. What does preventive care include? A yearly physical exam. This is also called an annual well check. Dental exams once or twice a year. Routine eye exams. Ask your health care provider how often you should have your eyes checked. Personal lifestyle choices, including: Daily care of your teeth and  gums. Regular physical activity. Eating a healthy diet. Avoiding tobacco and drug use. Limiting alcohol use. Practicing safe sex. Taking low-dose aspirin every day. Taking vitamin and mineral supplements as recommended by your health care provider. What happens during an annual well check? The services and screenings done by your health care provider during your annual well check will depend on your age, overall health, lifestyle risk factors, and family history of disease. Counseling  Your health care provider may ask you questions about your: Alcohol use. Tobacco use. Drug use. Emotional well-being. Home and relationship well-being. Sexual activity. Eating habits. History of falls. Memory and ability to understand (cognition). Work and work Statistician. Reproductive health. Screening  You may have the following tests or measurements: Height, weight, and BMI. Blood pressure. Lipid and cholesterol levels. These may be checked every 5 years, or more frequently if you are over 52 years old. Skin check. Lung cancer screening. You may have this screening every year starting at age 55 if you have a 30-pack-year history of smoking and currently smoke or have quit within the past 15 years. Fecal occult blood test (FOBT) of the stool. You may have this test every year starting at age 22. Flexible sigmoidoscopy or colonoscopy. You may have a sigmoidoscopy every 5 years or a colonoscopy every 10 years starting at age 91.  Hepatitis C blood test. Hepatitis B blood test. Sexually transmitted disease (STD) testing. Diabetes screening. This is done by checking your blood sugar (glucose) after you have not eaten for a while (fasting). You may have this done every 1-3 years. Bone density scan. This is done to screen for osteoporosis. You may have this done starting at age 70. Mammogram. This may be done every 1-2 years. Talk to your health care provider about how often you should have regular  mammograms. Talk with your health care provider about your test results, treatment options, and if necessary, the need for more tests. Vaccines  Your health care provider may recommend certain vaccines, such as: Influenza vaccine. This is recommended every year. Tetanus, diphtheria, and acellular pertussis (Tdap, Td) vaccine. You may need a Td booster every 10 years. Zoster vaccine. You may need this after age 60. Pneumococcal 13-valent conjugate (PCV13) vaccine. One dose is recommended after age 71. Pneumococcal polysaccharide (PPSV23) vaccine. One dose is recommended after age 7. Talk to your health care provider about which screenings and vaccines you need and how often you need them. This information is not intended to replace advice given to you by your health care provider. Make sure you discuss any questions you have with your health care provider. Document Released: 05/04/2015 Document Revised: 12/26/2015 Document Reviewed: 02/06/2015 Elsevier Interactive Patient Education  2017 [place] Prevention in the Home Falls can cause injuries. They can happen to people of all ages. There are many things you can do to make your home safe and to help prevent falls. What can I do on the outside of my home? Regularly fix the edges of walkways and driveways and fix any cracks. Remove anything that might make you trip as you walk through a door, such as a raised step or threshold. Trim any bushes or trees on the path to your home. Use bright outdoor lighting. Clear any walking paths of anything that might make someone trip, such as rocks or tools. Regularly check to see if handrails are loose or broken. Make sure that both sides of any steps have handrails. Any raised decks and porches should have guardrails on the edges. Have any leaves, snow, or ice cleared regularly. Use sand or salt on walking paths during winter. Clean up any spills in your garage right away. This includes oil  or grease spills. What can I do in the bathroom? Use night lights. Install grab bars by the toilet and in the tub and shower. Do not use towel bars as grab bars. Use non-skid mats or decals in the tub or shower. If you need to sit down in the shower, use a plastic, non-slip stool. Keep the floor dry. Clean up any water that spills on the floor as soon as it happens. Remove soap buildup in the tub or shower regularly. Attach bath mats securely with double-sided non-slip rug tape. Do not have throw rugs and other things on the floor that can make you trip. What can I do in the bedroom? Use night lights. Make sure that you have a light by your bed that is easy to reach. Do not use any sheets or blankets that are too big for your bed. They should not hang down onto the floor. Have a firm chair that has side arms. You can use this for support while you get dressed. Do not have throw rugs and other things on the floor that can make you trip. What can I do in  the kitchen? Clean up any spills right away. Avoid walking on wet floors. Keep items that you use a lot in easy-to-reach places. If you need to reach something above you, use a strong step stool that has a grab bar. Keep electrical cords out of the way. Do not use floor polish or wax that makes floors slippery. If you must use wax, use non-skid floor wax. Do not have throw rugs and other things on the floor that can make you trip. What can I do with my stairs? Do not leave any items on the stairs. Make sure that there are handrails on both sides of the stairs and use them. Fix handrails that are broken or loose. Make sure that handrails are as long as the stairways. Check any carpeting to make sure that it is firmly attached to the stairs. Fix any carpet that is loose or worn. Avoid having throw rugs at the top or bottom of the stairs. If you do have throw rugs, attach them to the floor with carpet tape. Make sure that you have a light  switch at the top of the stairs and the bottom of the stairs. If you do not have them, ask someone to add them for you. What else can I do to help prevent falls? Wear shoes that: Do not have high heels. Have rubber bottoms. Are comfortable and fit you well. Are closed at the toe. Do not wear sandals. If you use a stepladder: Make sure that it is fully opened. Do not climb a closed stepladder. Make sure that both sides of the stepladder are locked into place. Ask someone to hold it for you, if possible. Clearly mark and make sure that you can see: Any grab bars or handrails. First and last steps. Where the edge of each step is. Use tools that help you move around (mobility aids) if they are needed. These include: Canes. Walkers. Scooters. Crutches. Turn on the lights when you go into a dark area. Replace any light bulbs as soon as they burn out. Set up your furniture so you have a clear path. Avoid moving your furniture around. If any of your floors are uneven, fix them. If there are any pets around you, be aware of where they are. Review your medicines with your doctor. Some medicines can make you feel dizzy. This can increase your chance of falling. Ask your doctor what other things that you can do to help prevent falls. This information is not intended to replace advice given to you by your health care provider. Make sure you discuss any questions you have with your health care provider. Document Released: 02/01/2009 Document Revised: 09/13/2015 Document Reviewed: 05/12/2014 Elsevier Interactive Patient Education  2017 [employer].

## 2021-03-21 NOTE — Progress Notes (Signed)
Subjective:   Julia Pierce is a 65 y.o. female who presents for Medicare Annual (Subsequent) preventive examination.  Review of Systems    No ROS.  Medicare Wellness Virtual Visit.  Visual/audio telehealth visit, UTA vital signs.   See social history for additional risk factors.   Cardiac Risk Factors include: advanced age (>24mn, >>62women)     Objective:    Today's Vitals   03/21/21 1315  Weight: 156 lb (70.8 kg)  Height: 5' 2.5" (1.588 m)   Body mass index is 28.08 kg/m.  Advanced Directives 03/21/2021 03/20/2020 02/06/2020 03/09/2019 12/16/2017 12/15/2016 12/31/2015  Does Patient Have a Medical Advance Directive? _0  No No  Would patient like information on creating a medical advance directive? No - Patient declined No - Patient declined - No - Patient declined No - Patient declined No - Patient declined -    Current Medications (verified) Outpatient Encounter Medications as of 03/21/2021  Medication Sig   Accu-Chek FastClix Lancets MISC TEST ONCE DAILY E11.9   ACCU-CHEK GUIDE test strip TEST ONCE DAILY E11.9   Alpha-D-Galactosidase (BEANO PO) Take by mouth. Before meals   amLODipine (NORVASC) 2.5 MG tablet TAKE 1 TABLET BY MOUTH EVERY DAY   Azelaic Acid 15 % cream APPLY TO AFFECTED AREA EVERY DAY   B Complex Vitamins (B COMPLEX PO) Take 1 tablet by mouth daily.    blood glucose meter kit and supplies Dispense based on patient and insurance preference. Use to check blood sugar once daily. (FOR ICD-10 E11.9).   buPROPion (WELLBUTRIN XL) 150 MG 24 hr tablet TAKE 1 TABLET BY MOUTH EVERY DAY   calcium elemental as carbonate (BARIATRIC TUMS ULTRA) 400 MG chewable tablet Chew 400 mg by mouth daily.   carvedilol (COREG) 25 MG tablet Take 1 tablet (25 mg total) by mouth 2 (two) times daily.   Cholecalciferol (VITAMIN D3) 2000 UNITS TABS Take 1 tablet by mouth daily.   diphenoxylate-atropine (LOMOTIL) 2.5-0.025 MG tablet Take 1 tablet by mouth 4 (four) times daily as  needed for diarrhea or loose stools.   esomeprazole (NEXIUM) 20 MG capsule Take 10 mg by mouth daily.   fluticasone (FLONASE) 50 MCG/ACT nasal spray Place 2 sprays into both nostrils daily.   losartan (COZAAR) 100 MG tablet Take 100 mg by mouth daily.   molnupiravir EUA (LAGEVRIO) 200 mg CAPS capsule Take 4 capsules (800 mg total) by mouth 2 (two) times daily for 5 days.   Multiple Vitamin (MULTIVITAMIN) tablet Take 1 tablet by mouth daily.   sertraline (ZOLOFT) 100 MG tablet TAKE 1 TABLET BY MOUTH EVERY DAY   tolterodine (DETROL) 2 MG tablet TAKE 1 TABLET BY MOUTH TWICE A DAY   Facility-Administered Encounter Medications as of 03/21/2021  Medication   diclofenac sodium (VOLTAREN) 1 % transdermal gel 4 g   midazolam (VERSED) 5 MG/5ML injection 5 mg   orphenadrine (NORFLEX) injection 60 mg   sodium chloride 0.9 % injection 20 mL   triamcinolone acetonide (KENALOG-40) injection 40 mg    Allergies (verified) Cephalexin, Penicillin g, Penicillins, Atorvastatin, Cymbalta [duloxetine hcl], Duloxetine, Fenofibrate, Statins, Zofran [ondansetron], and Latex   History: Past Medical History:  Diagnosis Date   Anxiety    Arthritis    Cardiac arrhythmia due to congenital heart disease    Chicken pox    Chronic systolic heart failure ([facility] 09/13/2013   EF now 55%  Bu June 2019   ECHO   Depression    Diabetes ([facility]  Fibromyalgia    Headache, frequent episodic tension-type    High cholesterol    History of high blood pressure    readings   Holter monitor, abnormal 11/2010   done for long QT.  some contractile asynchrony [name])   Hx of colonoscopy 12/2010   normal,  next due 2022, [name]   Hyperlipidemia    Hypertension    Morton's neuroma    bilateral   Pacemaker    3 wire w/o difibrulator   Syncope    Past Surgical History:  Procedure Laterality Date   ABDOMINAL HYSTERECTOMY  Dec 2013   [facility]   APPENDECTOMY  1973   BI-VENTRICULAR PACEMAKER INSERTION (CRT-P)  05/2013   MDT  CRTP implanted by Dr Lovena Le for cardiomyopathy, LBBB, and syncope   HYSTEROSCOPY  2005   heavy bleeding   IMPLANTABLE CARDIOVERTER DEFIBRILLATOR IMPLANT N/A 06/02/2013   Procedure: IMPLANTABLE CARDIOVERTER DEFIBRILLATOR IMPLANT;  Surgeon: Evans Lance, MD;  Location: [facility] CATH LAB;  Service: Cardiovascular;  Laterality: N/A;   SPINAL FUSION  03/2007   ruptured disc  L5  [name]   TONSILLECTOMY AND ADENOIDECTOMY  1966   Family History  Problem Relation Age of Onset   Arthritis Mother    Hyperlipidemia Mother    Cirrhosis Mother    Arthritis Father    Cancer Father        prostate cancer    Hyperlipidemia Father    Pulmonary embolism Father 70       blood clots   Alzheimer's disease Father    Diabetes Brother    Coronary artery disease Paternal Grandfather 45   Diabetes Maternal Uncle    Cancer Other        ovarian,uterus   Heart disease Other    Stroke Other    Learning disabilities Other    Social History   Socioeconomic History   Marital status: Married    Spouse name: Not on file   Number of children: Not on file   Years of education: Not on file   Highest education level: Not on file  Occupational History   Not on file  Tobacco Use   Smoking status: Never   Smokeless tobacco: Never  Vaping Use   Vaping Use: Never used  Substance and Sexual Activity   Alcohol use: No   Drug use: No   Sexual activity: Yes  Other Topics Concern   Not on file  Social History Narrative   Not on file   Social Determinants of Health   Financial Resource Strain: Low Risk    Difficulty of Paying Living Expenses: Not very hard  Food Insecurity: No Food Insecurity   Worried About Running Out of Food in the Last Year: Never true   Ran Out of Food in the Last Year: Never true  Transportation Needs: No Transportation Needs   Lack of Transportation (Medical): No   Lack of Transportation (Non-Medical): No  Physical Activity: Unknown   Days of Exercise per Week: 0 days   Minutes  of Exercise per Session: Not on file  Stress: No Stress Concern Present   Feeling of Stress : Only a little  Social Connections: Unknown   Frequency of Communication with Friends and Family: Not on file   Frequency of Social Gatherings with Friends and Family: Not on file   Attends Religious Services: Not on file   Active Member of Clubs or Organizations: Not on file   Attends Archivist Meetings: Not on file  Marital Status: Married    Tobacco Counseling Counseling given: Not Answered   Clinical Intake:  Pre-visit preparation completed: Yes    Diabetic: Is the patient be seen by Chronic Care Management for management of their diabetes?  Yes  Would the patient like to be referred to a Nutritionist or for Diabetic Management?  No     Diabetes: Yes (Followed by pcp)  How often do you need to have someone help you when you read instructions, pamphlets, or other written materials from your doctor or pharmacy?: 1 - Never Interpreter Needed?: No      Activities of Daily Living In your present state of health, do you have any difficulty performing the following activities: 03/21/2021  Hearing? N  Vision? N  Difficulty concentrating or making decisions? N  Walking or climbing stairs? N  Dressing or bathing? N  Doing errands, shopping? N  Preparing Food and eating ? N  Using the Toilet? N  In the past six months, have you accidently leaked urine? Y  Comment Managed with daily liner  Do you have problems with loss of bowel control? N  Managing your Medications? N  Managing your Finances? N  Housekeeping or managing your Housekeeping? N  Some recent data might be hidden    Patient Care Team: Crecencio Mc, MD as PCP - General (Internal Medicine) De Hollingshead, RPH-CPP (Pharmacist)  Indicate any recent Medical Services you may have received from other than [facility] providers in the past year (date may be approximate).     Assessment:   This is a routine  wellness examination for [place].  Virtual Visit via Telephone Note  I connected with  Julia Pierce on 03/21/21 at  1:15 PM EST by telephone and verified that I am speaking with the correct person using two identifiers.  Location: Patient: home Provider: office Persons participating in the virtual visit: patient/Nurse Health Advisor   I discussed the limitations, risks, security and privacy concerns of performing an evaluation and management service by telephone and the availability of in person appointments. The patient expressed understanding and agreed to proceed.  Interactive audio and video telecommunications were attempted between this nurse and patient, however failed, due to patient having technical difficulties OR patient did not have access to video capability.  We continued and completed visit with audio only.  Some vital signs may be absent or patient reported.   Hearing/Vision screen Hearing Screening - Comments:: Patient is able to hear conversational tones without difficulty.  No issues reported.  Vision Screening - Comments:: Followed by [facility]  Wears corrective lenses   Dietary issues and exercise activities discussed: Current Exercise Habits: Home exercise routine, Intensity: Mild Healthy diet Good water intake   Goals Addressed             This Visit's Progress    Maintain Healthy Lifestyle       Stay active  Healthy diet Walk the treadmill        Depression Screen PHQ 2/9 Scores 03/21/2021 02/20/2021 11/15/2020 08/15/2020 04/04/2020 03/20/2020 02/06/2020  PHQ - 2 Score - _0 0 0 1  PHQ- 9 Score - _1 - - -  Exception Documentation (No Data) - - - - - -    Fall Risk Fall Risk  02/20/2021 11/15/2020 10/09/2020 08/15/2020 04/04/2020  Falls in the past year? _2 0 0  Comment - - - - -  Number falls in past yr: 0 0 0 -  0  Injury with Fall? _0 - 0  Risk for fall due to : No Fall Risks History of fall(s) - - -  Risk for fall due to:  Comment - - - - -  Follow up _1     FALL RISK PREVENTION PERTAINING TO THE HOME: Home free of loose throw rugs in walkways, pet beds, electrical cords, etc? Yes  Adequate lighting in your home to reduce risk of falls? Yes   ASSISTIVE DEVICES UTILIZED TO PREVENT FALLS: Use of a cane, walker or w/c? No   TIMED UP AND GO: Was the test performed? No .   Cognitive Function: Patient is alert and oriented x3.  MMSE - Mini Mental State Exam 12/16/2017 12/15/2016  Orientation to time 5 5  Orientation to Place 5 5  Registration 3 3  Attention/ Calculation 5 5  Recall 3 3  Language- name 2 objects 2 2  Language- repeat 1 1  Language- follow 3 step command 3 3  Language- read & follow direction 1 1  Write a sentence 1 1  Copy design 1 1  Total score 30 30     6CIT Screen 03/09/2019  What Year? 0 points  What month? 0 points  What time? 0 points  Count back from 20 0 points  Months in reverse 0 points  Repeat phrase 0 points  Total Score 0   Immunizations Immunization History  Administered Date(s) Administered   Fluad Quad(high Dose 65+) 01/31/2020   Influenza Split 03/28/2014   Influenza,inj,Quad PF,6+ Mos 02/07/2013, 04/11/2015, 03/06/2016, 03/18/2017, 03/24/2018, 02/04/2019   PFIZER(Purple Top)SARS-COV-2 Vaccination 07/29/2019, 08/23/2019   Pneumococcal Conjugate-13 06/28/2014   Pneumococcal Polysaccharide-23 05/11/2013, 04/04/2020   Tdap 02/08/2008, 03/24/2018   Shingrix Completed?: No.    Education has been provided regarding the importance of this vaccine. Patient has been advised to call insurance company to determine out of pocket expense if they have not yet received this vaccine. Advised may also receive vaccine at local pharmacy or Health Dept. Verbalized acceptance and understanding.  Screening Tests Health Maintenance  Topic Date Due    COVID-19 Vaccine (3 - Pfizer risk series) 04/06/2021 (Originally 09/20/2019)   Zoster Vaccines- Shingrix (1 of 2) 06/19/2021 (Originally 07/24/1973)   INFLUENZA VACCINE  07/19/2021 (Originally 11/19/2020)   MAMMOGRAM  06/22/2021   FOOT EXAM  08/15/2021   HEMOGLOBIN A1C  08/18/2021   OPHTHALMOLOGY EXAM  10/19/2021   COLONOSCOPY (Pts 45-25yr Insurance coverage will need to be confirmed)  12/01/2027   TETANUS/TDAP  03/24/2028   Pneumonia Vaccine 66 Years old  Completed   DEXA SCAN  Completed   Hepatitis C Screening  Completed   HPV VACCINES  Aged Out   Health Maintenance There are no preventive care reminders to display for this patient.  Lung Cancer Screening: (Low Dose CT Chest recommended if Age 66-80years, 30 pack-year currently smoking OR have quit w/in 15years.) does not qualify.   Vision Screening: Recommended annual ophthalmology exams for early detection of glaucoma and other disorders of the eye.  Dental Screening: Recommended annual dental exams for proper oral hygiene  Community Resource Referral / Chronic Care Management: CRR required this visit?  No   CCM required this visit?  No      Plan:   Keep all routine maintenance appointments.   I have personally reviewed and noted the following in the patient's chart:   Medical and social history  Use of alcohol, tobacco or illicit drugs  Current medications and supplements including opioid prescriptions. Not taking opioid.  Functional ability and status Nutritional status Physical activity Advanced directives List of other physicians Hospitalizations, surgeries, and ER visits in previous 12 months Vitals Screenings to include cognitive, depression, and falls Referrals and appointments  In addition, I have reviewed and discussed with patient certain preventive protocols, quality metrics, and best practice recommendations. A written personalized care plan for preventive services as well as general preventive health  recommendations were provided to patient.     Varney Biles, LPN   12/28/8336

## 2021-03-28 NOTE — Progress Notes (Signed)
Remote pacemaker transmission.   

## 2021-04-18 DIAGNOSIS — I420 Dilated cardiomyopathy: Secondary | ICD-10-CM | POA: Diagnosis not present

## 2021-04-18 DIAGNOSIS — E782 Mixed hyperlipidemia: Secondary | ICD-10-CM | POA: Diagnosis not present

## 2021-04-18 DIAGNOSIS — I10 Essential (primary) hypertension: Secondary | ICD-10-CM | POA: Diagnosis not present

## 2021-04-18 DIAGNOSIS — Z950 Presence of cardiac pacemaker: Secondary | ICD-10-CM | POA: Diagnosis not present

## 2021-04-29 ENCOUNTER — Telehealth: Payer: Self-pay

## 2021-04-29 NOTE — Chronic Care Management (AMB) (Signed)
°  Care Management   Note  04/29/2021 Name: Julia Pierce MRN: [id number] DOB: 09-Apr-1956  Julia Pierce is a 66 y.o. year old female who is a primary care patient of Derrel Nip, Aris Everts, MD and is actively engaged with the care management team. I reached out to Milus Mallick by phone today to assist with re-scheduling a follow up visit with the Pharmacist  Follow up plan: Unsuccessful telephone outreach attempt made. A HIPAA compliant phone message was left for the patient providing contact information and requesting a return call.  The care management team will reach out to the patient again over the next 5 days.  If patient returns call to provider office, please advise to call [facility]  [facility] [place], [place], [place], [place] [address] Direct Dial: [phone] [email or web address]: [email or web address]

## 2021-04-30 ENCOUNTER — Telehealth: Payer: HMO

## 2021-05-09 ENCOUNTER — Ambulatory Visit (INDEPENDENT_AMBULATORY_CARE_PROVIDER_SITE_OTHER): Payer: HMO | Admitting: Internal Medicine

## 2021-05-09 ENCOUNTER — Other Ambulatory Visit: Payer: Self-pay

## 2021-05-09 ENCOUNTER — Encounter: Payer: Self-pay | Admitting: Internal Medicine

## 2021-05-09 VITALS — BP 124/72 | HR 70 | Ht 62.5 in | Wt 153.0 lb

## 2021-05-09 DIAGNOSIS — I428 Other cardiomyopathies: Secondary | ICD-10-CM | POA: Diagnosis not present

## 2021-05-09 DIAGNOSIS — Z950 Presence of cardiac pacemaker: Secondary | ICD-10-CM | POA: Diagnosis not present

## 2021-05-09 DIAGNOSIS — I429 Cardiomyopathy, unspecified: Secondary | ICD-10-CM

## 2021-05-09 DIAGNOSIS — I447 Left bundle-branch block, unspecified: Secondary | ICD-10-CM | POA: Diagnosis not present

## 2021-05-09 NOTE — Patient Instructions (Signed)
Medication Instructions:  Your physician recommends that you continue on your current medications as directed. Please refer to the Current Medication list given to you today.  Labwork: None ordered.  Testing/Procedures: None ordered.  Follow-Up: Your physician wants you to follow-up in: one year with Cristopher Peru, MD or one of the following Advanced Practice Providers on your designated Care Team:   Tommye Standard, [place] Legrand Como "Jonni Sanger" Chalmers Cater, [place]  Remote monitoring is used to monitor your Pacemaker from home. This monitoring reduces the number of office visits required to check your device to one time per year. It allows [place] to keep an eye on the functioning of your device to ensure it is working properly. You are scheduled for a device check from home on 06/19/2021. You may send your transmission at any time that day. If you have a wireless device, the transmission will be sent automatically. After your physician reviews your transmission, you will receive a postcard with your next transmission date.  Any Other Special Instructions Will Be Listed Below (If Applicable).  If you need a refill on your cardiac medications before your next appointment, please call your pharmacy.

## 2021-05-09 NOTE — Progress Notes (Signed)
HPI Julia Pierce returns today for ongoing evaluation and management of her biventricular pacemaker and chronic systolic heart failure.  In the interim she has been stable.  Her weight is unchanged.  She denies syncope, chest pain, or shortness of breath.  She admits to being a little more sedentary.  Allergies  Allergen Reactions   Cephalexin Anaphylaxis   Penicillin G Hives, Shortness Of Breath and Swelling   Penicillins Hives, Shortness Of Breath and Swelling   Atorvastatin     Other reaction(s): Muscle Pain myalgias myalgias   Cymbalta [Duloxetine Hcl] Other (See Comments)   Duloxetine     Other reaction(s): Muscle Pain Other reaction(s): Muscle Pain   Fenofibrate     Muscle pain    Statins     Severe myalgias   Zofran [Ondansetron] Other (See Comments)    Prolong QT   Latex Rash     Current Outpatient Medications  Medication Sig Dispense Refill   Accu-Chek FastClix Lancets MISC TEST ONCE DAILY E11.9 102 each 1   ACCU-CHEK GUIDE test strip TEST ONCE DAILY E11.9 100 strip 1   Alpha-D-Galactosidase (BEANO PO) Take by mouth. Before meals     amLODipine (NORVASC) 2.5 MG tablet TAKE 1 TABLET BY MOUTH EVERY DAY 90 tablet 3   Azelaic Acid 15 % cream APPLY TO AFFECTED AREA EVERY DAY     B Complex Vitamins (B COMPLEX PO) Take 1 tablet by mouth daily.      blood glucose meter kit and supplies Dispense based on patient and insurance preference. Use to check blood sugar once daily. (FOR ICD-10 E11.9). 1 each 0   buPROPion (WELLBUTRIN XL) 150 MG 24 hr tablet TAKE 1 TABLET BY MOUTH EVERY DAY 90 tablet 1   calcium elemental as carbonate (BARIATRIC TUMS ULTRA) 400 MG chewable tablet Chew 400 mg by mouth daily.     carvedilol (COREG) 25 MG tablet Take 1 tablet (25 mg total) by mouth 2 (two) times daily. 180 tablet 3   Cholecalciferol (VITAMIN D3) 2000 UNITS TABS Take 1 tablet by mouth daily.     diphenoxylate-atropine (LOMOTIL) 2.5-0.025 MG tablet Take 1 tablet by mouth 4 (four)  times daily as needed for diarrhea or loose stools. 30 tablet 1   esomeprazole (NEXIUM) 20 MG capsule Take 10 mg by mouth daily.     fluticasone (FLONASE) 50 MCG/ACT nasal spray Place 2 sprays into both nostrils daily.     losartan (COZAAR) 100 MG tablet Take 100 mg by mouth daily.     Multiple Vitamin (MULTIVITAMIN) tablet Take 1 tablet by mouth daily.     sertraline (ZOLOFT) 100 MG tablet TAKE 1 TABLET BY MOUTH EVERY DAY 90 tablet 1   tolterodine (DETROL) 2 MG tablet TAKE 1 TABLET BY MOUTH TWICE A DAY 180 tablet 3   Current Facility-Administered Medications  Medication Dose Route Frequency Provider Last Rate Last Admin   diclofenac sodium (VOLTAREN) 1 % transdermal gel 4 g  4 g Topical QID Mohammed Kindle, MD       midazolam (VERSED) 5 MG/5ML injection 5 mg  5 mg Intravenous Once Mohammed Kindle, MD       orphenadrine (NORFLEX) injection 60 mg  60 mg Intramuscular Once Mohammed Kindle, MD       sodium chloride 0.9 % injection 20 mL  20 mL Other Once Mohammed Kindle, MD       triamcinolone acetonide (KENALOG-40) injection 40 mg  40 mg Other Once Mohammed Kindle, MD  Past Medical History:  Diagnosis Date   Anxiety    Arthritis    Cardiac arrhythmia due to congenital heart disease    Chicken pox    Chronic systolic heart failure ([place]) 09/13/2013   EF now 55%  Bu June 2019   ECHO   Depression    Diabetes ([place])    Fibromyalgia    Headache, frequent episodic tension-type    High cholesterol    History of high blood pressure    readings   Holter monitor, abnormal 11/2010   done for long QT.  some contractile asynchrony [name])   Hx of colonoscopy 12/2010   normal,  next due 2022, [name]   Hyperlipidemia    Hypertension    Morton's neuroma    bilateral   Pacemaker    3 wire w/o difibrulator   Syncope     ROS:   All systems reviewed and negative except as noted in the HPI.   Past Surgical History:  Procedure Laterality Date   ABDOMINAL HYSTERECTOMY  Dec 2013    [place]   APPENDECTOMY  1973   BI-VENTRICULAR PACEMAKER INSERTION (CRT-P)  05/2013   MDT CRTP implanted by Dr Lovena Le for cardiomyopathy, LBBB, and syncope   HYSTEROSCOPY  2005   heavy bleeding   IMPLANTABLE CARDIOVERTER DEFIBRILLATOR IMPLANT N/A 06/02/2013   Procedure: IMPLANTABLE CARDIOVERTER DEFIBRILLATOR IMPLANT;  Surgeon: Evans Lance, MD;  Location: [facility] CATH LAB;  Service: Cardiovascular;  Laterality: N/A;   SPINAL FUSION  03/2007   ruptured disc  L5  [name]   TONSILLECTOMY AND ADENOIDECTOMY  1966     Family History  Problem Relation Age of Onset   Arthritis Mother    Hyperlipidemia Mother    Cirrhosis Mother    Arthritis Father    Cancer Father        prostate cancer    Hyperlipidemia Father    Pulmonary embolism Father 75       blood clots   Alzheimer's disease Father    Diabetes Brother    Coronary artery disease Paternal Grandfather 40   Diabetes Maternal Uncle    Cancer Other        ovarian,uterus   Heart disease Other    Stroke Other    Learning disabilities Other      Social History   Socioeconomic History   Marital status: Married    Spouse name: Not on file   Number of children: Not on file   Years of education: Not on file   Highest education level: Not on file  Occupational History   Not on file  Tobacco Use   Smoking status: Never   Smokeless tobacco: Never  Vaping Use   Vaping Use: Never used  Substance and Sexual Activity   Alcohol use: No   Drug use: No   Sexual activity: Yes  Other Topics Concern   Not on file  Social History Narrative   Not on file   Social Determinants of Health   Financial Resource Strain: Low Risk    Difficulty of Paying Living Expenses: Not very hard  Food Insecurity: No Food Insecurity   Worried About Running Out of Food in the Last Year: Never true   Ran Out of Food in the Last Year: Never true  Transportation Needs: No Transportation Needs   Lack of Transportation (Medical): No   Lack of Transportation  (Non-Medical): No  Physical Activity: Unknown   Days of Exercise per Week: 0 days   Minutes  of Exercise per Session: Not on file  Stress: No Stress Concern Present   Feeling of Stress : Only a little  Social Connections: Unknown   Frequency of Communication with Friends and Family: Not on file   Frequency of Social Gatherings with Friends and Family: Not on file   Attends Religious Services: Not on Electrical engineer or Organizations: Not on file   Attends Archivist Meetings: Not on file   Marital Status: Married  Human resources officer Violence: Not At Risk   Fear of Current or Ex-Partner: No   Emotionally Abused: No   Physically Abused: No   Sexually Abused: No     BP 124/72    Pulse 70    Ht 5' 2.5" (1.588 m)    Wt 153 lb (69.4 kg)    SpO2 97%    BMI 27.54 kg/m   Physical Exam:  Well appearing NAD HEENT: Unremarkable Neck:  No JVD, no thyromegally Lymphatics:  No adenopathy Back:  No CVA tenderness Lungs:  Clear HEART:  Regular rate rhythm, no murmurs, no rubs, no clicks Abd:  soft, positive bowel sounds, no organomegally, no rebound, no guarding Ext:  2 plus pulses, no edema, no cyanosis, no clubbing Skin:  No rashes no nodules Neuro:  CN II through XII intact, motor grossly intact  EKG  DEVICE  Normal device function.  See PaceArt for details.   Assess/Plan:  1. Chronic systolic heart failure - her symptoms are class 2. She will continue her current meds. Her fluid was up in the fall but better now. 2. CHB - she has no escape today. She is asymptomatic, s/p PPM 3. Hair loss - this appears to be improved since switching to coreg 4. HTN - her bp is improved. We will follow.   Julia Overlie Kinsleigh Ludolph,MD

## 2021-05-22 ENCOUNTER — Encounter: Payer: Self-pay | Admitting: Internal Medicine

## 2021-05-22 NOTE — Chronic Care Management (AMB) (Signed)
°  Care Management   Note  05/22/2021 Name: Julia Pierce MRN: [id number] DOB: 1956/01/15  Julia Pierce is a 66 y.o. year old female who is a primary care patient of Derrel Nip, Aris Everts, MD and is actively engaged with the care management team. I reached out to Milus Mallick by phone today to assist with re-scheduling a follow up visit with the Pharmacist  Follow up plan: Telephone appointment with care management team member scheduled for:06/11/2021  Noreene Larsson, [address], [place], [place] [address] Direct Dial: [phone] [email or web address]

## 2021-05-23 MED ORDER — BLOOD GLUCOSE METER KIT: PACK | 0 refills | Status: AC

## 2021-06-05 ENCOUNTER — Other Ambulatory Visit: Payer: Self-pay | Admitting: Internal Medicine

## 2021-06-13 ENCOUNTER — Ambulatory Visit (INDEPENDENT_AMBULATORY_CARE_PROVIDER_SITE_OTHER): Payer: HMO | Admitting: Pharmacist

## 2021-06-13 DIAGNOSIS — I700 Atherosclerosis of aorta: Secondary | ICD-10-CM

## 2021-06-13 DIAGNOSIS — K580 Irritable bowel syndrome with diarrhea: Secondary | ICD-10-CM

## 2021-06-13 DIAGNOSIS — I429 Cardiomyopathy, unspecified: Secondary | ICD-10-CM

## 2021-06-13 DIAGNOSIS — I10 Essential (primary) hypertension: Secondary | ICD-10-CM

## 2021-06-13 DIAGNOSIS — M791 Myalgia, unspecified site: Secondary | ICD-10-CM

## 2021-06-13 DIAGNOSIS — E1169 Type 2 diabetes mellitus with other specified complication: Secondary | ICD-10-CM

## 2021-06-13 MED ORDER — GLUCOSE BLOOD VI STRP: ORAL_STRIP | 12 refills | Status: DC

## 2021-06-13 NOTE — Chronic Care Management (AMB) (Signed)
Chronic Care Management CCM Pharmacy Note  06/13/2021 Name:  Julia Pierce MRN:  [id number] DOB:  April 14, 1956  Summary: - Tolerating current regimen  Recommendations/Changes made from today's visit: - Discussed vaccine recommendations - Recommended to continue current regimen   Subjective: Julia Pierce is an 66 y.o. year old female who is a primary patient of Derrel Nip, Aris Everts, MD.  The CCM team was consulted for assistance with disease management and care coordination needs.    Engaged with patient by telephone for follow up visit for pharmacy case management and/or care coordination services.   Objective:  Medications Reviewed Today     Reviewed by De Hollingshead, RPH-CPP (Pharmacist) on 06/13/21 at 1120  Med List Status: <None>   Medication Order Taking? Sig Documenting Provider Last Dose Status Informant  Alpha-D-Galactosidase (BEANO PO) [id number] Yes Take by mouth. Before meals [provider] Taking Active   amLODipine (NORVASC) 2.5 MG tablet [id number] Yes TAKE 1 TABLET BY MOUTH EVERY DAY Crecencio Mc, MD Taking Active   Azelaic Acid 15 % cream [id number] Yes APPLY TO [place] [provider] Taking Active            Med Note Albin Felling, [place]   Wed Aug 15, 2020 11:11 AM) BID in AM & PM  B Complex Vitamins (B COMPLEX PO) [id number] Yes Take 1 tablet by mouth daily.  [provider] Taking Active Self  blood glucose meter kit and supplies [id number]  Use to check blood sugars once daily daily. (FOR ICD-10 E11.9). Crecencio Mc, MD  Active   buPROPion (WELLBUTRIN XL) 150 MG 24 hr tablet [id number] Yes TAKE 1 TABLET BY MOUTH EVERY DAY Crecencio Mc, MD Taking Active   calcium elemental as carbonate (BARIATRIC TUMS ULTRA) 400 MG chewable tablet [id number] Yes Chew 400 mg by mouth daily. [provider] Taking Active            Med Note Olena Heckle, MICHELE   Wed Apr 01, 2017  3:12 PM)    carvedilol (COREG) 25 MG tablet [id number]  Yes Take 1 tablet (25 mg total) by mouth 2 (two) times daily. Evans Lance, MD Taking Active   Cholecalciferol (VITAMIN D3) 2000 UNITS TABS [id number] Yes Take 1 tablet by mouth daily. [provider] Taking Active Self  diclofenac sodium (VOLTAREN) 1 % transdermal gel 4 g [id number]   Mohammed Kindle, MD  Consider Medication Status and Discontinue   diphenoxylate-atropine (LOMOTIL) 2.5-0.025 MG tablet [id number] Yes Take 1 tablet by mouth 4 (four) times daily as needed for diarrhea or loose stools. Crecencio Mc, MD Taking Active   esomeprazole (NEXIUM) 10 MG packet [id number] Yes Take 10 mg by mouth daily. [provider] Taking Active Self  fluticasone (FLONASE) 50 MCG/ACT nasal spray [id number] Yes Place 2 sprays into both nostrils daily. [provider] Taking Active            Med Note (NWOGU, IVY A   Mon Oct 08, 2020  9:02 AM) Taking PRN  losartan (COZAAR) 100 MG tablet [id number] Yes Take 100 mg by mouth daily. [provider] Taking Active            Med Note Darnelle Maffucci, Arville Lime   Wed Nov 14, 2020  9:10 AM)    midazolam (VERSED) 5 MG/5ML injection 5 mg [id number]   Mohammed Kindle, MD  Consider Medication Status and Discontinue   Multiple Vitamin (MULTIVITAMIN) tablet [id number] Yes Take 1 tablet by mouth daily.  [provider] Taking Active Self  orphenadrine (NORFLEX) injection 60 mg [id number]   Mohammed Kindle, MD  Consider Medication Status and Discontinue   sertraline (ZOLOFT) 100 MG tablet [id number] Yes TAKE 1 TABLET BY MOUTH EVERY DAY Crecencio Mc, MD Taking Active   sodium chloride 0.9 % injection 20 mL [id number]   Mohammed Kindle, MD  Consider Medication Status and Discontinue   tolterodine (DETROL) 2 MG tablet [id number] Yes TAKE 1 TABLET BY MOUTH TWICE A DAY Crecencio Mc, MD Taking Active   triamcinolone acetonide [facility]) injection 40 mg [id number]   Mohammed Kindle, MD  Consider Medication Status and Discontinue (No longer needed (for  PRN medications))   Med List Note Landis Martins, RN 12/31/15 1040): UDS 09-20-15 meds  Due 02-11-16 CS 11/30/2014            Pertinent Labs:   Lab Results  Component Value Date   HGBA1C 6.1 02/18/2021   Lab Results  Component Value Date   CHOL 222 (H) 02/18/2021   HDL 36.00 (L) 02/18/2021   LDLCALC 88 08/17/2013   LDLDIRECT 120.0 02/18/2021   TRIG (H) 02/18/2021    486.0 Triglyceride is over 400; calculations on Lipids are invalid.   CHOLHDL 6 02/18/2021   Lab Results  Component Value Date   CREATININE 0.69 02/18/2021   BUN 14 02/18/2021   NA 142 02/18/2021   K 4.1 02/18/2021   CL 104 02/18/2021   CO2 29 02/18/2021    SDOH:  (Social Determinants of Health) assessments and interventions performed:  SDOH Interventions    Flowsheet Row Most Recent Value  SDOH Interventions   Financial Strain Interventions Intervention Not Indicated       CCM Care Plan  Review of patient past medical history, allergies, medications, health status, including review of consultants reports, laboratory and other test data, was performed as part of comprehensive evaluation and provision of chronic care management services.   Care Plan : Medication Management  Updates made by De Hollingshead, RPH-CPP since 06/13/2021 12:00 AM  Completed 06/13/2021   Problem: T2DM, Depression/Anxiety, Hyperlipidemia, Chronic Pain, Gout Resolved 06/13/2021     Long-Range Goal: Disease Progression Prevention Completed 06/13/2021  Recent Progress: On track  Priority: High  Note:   Current Barriers:  Unable to independently afford treatment regimen Suboptimal therapeutic regimen for ASCVD risk reduction  Pharmacist Clinical Goal(s):  Over the next 90 days, patient will verbalize ability to afford treatment regimen through collaboration with PharmD and provider.   Interventions: 1:1 collaboration with Crecencio Mc, MD regarding development and update of comprehensive plan of care as evidenced  by provider attestation and co-signature Inter-disciplinary care team collaboration (see longitudinal plan of care) Comprehensive medication review performed; medication list updated in electronic medical record  Health Maintenance Yearly diabetic eye exam: up to date Yearly diabetic foot exam: up to date Urine microalbumin: up to date Yearly influenza vaccination: due - recommended seasonal dose Td/Tdap vaccination: up to date Pneumonia vaccination: up to date COVID vaccinations: due - recommended bivalent booster Shingrix vaccinations: due - recommend to consider  Colonoscopy: up to date Bone density scan: up to date  Mammogram: up to date  Diabetes: Controlled; current treatment: none; No hx metformin, though functional diarrhea at baseline Hx Ozempic - severe nausea, stomach upset.  Current glucose readings: fastings 120s; post prandial: 140-150s Reports her pharmacy did not dispense test strips with new One Touch meter. Script sent for test strips Meal patterns: Protein bar for breakfast. Cutting  back on breads, sweets Recommended to continue current regimen at this time  Hypertension, Cardiomyopathy; s/p pacer placement: Controlled per last office visit; current treatment: amlodipine 2.5 mg daily, losartan 100 mg daily, carvedilol 25 mg BID Hx of metoprolol - hair loss Home BP readings: 110-120s/70-80s; HR 70s Denies any lightheadedness, dizziness.  Recommended to continue current treatment with home monitoring in collaboration with [name] (EP) and Dr. [name] (cardiology).  Hyperlipidemia: Uncontrolled; current treatment: none Medications previously tried: atorvastatin; reported myalgias. Ezetimibe, does not remember reason for discontinuation. Reports family history of statin intolerance.  Declines retrial of statin therapy. Could consider PCSK9i moving forward.   Depression/Anxiety Moderately well controlled; current treatment: sertraline 100 mg daily, bupropion  XL 150 mg daily  Previously recommended to continue current regimen at this time  Overactive Bladder: Well controlled per patient report; current regimen: tolterodine LA 2 mg BID Previously recommended to continue current regimen at this time  Rosacea: Controlled per patient report;; current regimen; azelaic acid 1%, follows w/ Dr. Aubery Lapping.  Previously recommended to continue current regimen at this time.  GERD/IBS: Diarrhea uncontrolled; esomeprazole 10 mg daily PRN, lomotil PRN - reports usually needing 2 doses (4 hours apart) when she is having diarrhea.  Reviewed dosing recommendations.  Recommended to continue current regimen at this time  Allergies: Controlled per patient report; current regimen: fluticasone nasal spray BID PRN.  Previously recommended to continue current regimen at this time  Patient Goals/Self-Care Activities Over the next 90 days, patient will:  - take medications as prescribed collaborate with provider on medication access solutions       Plan: goals of care met. Closing case at this time  Catie Darnelle Maffucci, PharmD, [place], CPP Clinical Pharmacist [employer] at [employer] [phone]

## 2021-06-13 NOTE — Patient Instructions (Signed)
Julia Pierce,   It was great talking with you today!  We recommend you get the updated bivalent COVID-19 booster, at least 2 months after any prior doses. You may consider delaying a booster dose by 3 months from a prior episode of COVID-19 per the CDC.   We recommend the Shingrix (shingles) vaccine series for all over age 67. It should have a $0 copay on all Medicare plans this year. You can pursue this without a prescription at your local pharmacy, or feel free to call our [place] at [facility] at [phone].  Let us know if you have any questions or concerns!  Catie Darnelle Maffucci, PharmD  Visit Information  Following are the goals we discussed today:  Patient Goals/Self-Care Activities Over the next 90 days, patient will:  - take medications as prescribed collaborate with provider on medication access solutions          Plan: goals of care met. Closing case at this time   Catie Darnelle Maffucci, PharmD, [place], CPP Clinical Pharmacist [place] at [facility] [phone]     Please call the care guide team at [phone] if you need to cancel or reschedule your appointment.   Patient verbalizes understanding of instructions and care plan provided today and agrees to view in [place]. Active MyChart status confirmed with patient.

## 2021-06-18 DIAGNOSIS — E1169 Type 2 diabetes mellitus with other specified complication: Secondary | ICD-10-CM

## 2021-06-18 DIAGNOSIS — I10 Essential (primary) hypertension: Secondary | ICD-10-CM | POA: Diagnosis not present

## 2021-06-18 DIAGNOSIS — E781 Pure hyperglyceridemia: Secondary | ICD-10-CM

## 2021-06-19 ENCOUNTER — Ambulatory Visit (INDEPENDENT_AMBULATORY_CARE_PROVIDER_SITE_OTHER): Payer: HMO

## 2021-06-19 ENCOUNTER — Ambulatory Visit: Payer: HMO

## 2021-06-19 DIAGNOSIS — I429 Cardiomyopathy, unspecified: Secondary | ICD-10-CM | POA: Diagnosis not present

## 2021-06-20 ENCOUNTER — Telehealth: Payer: Self-pay

## 2021-06-20 LAB — CUP PACEART REMOTE DEVICE CHECK
Battery Remaining Longevity: 8 mo
Battery Voltage: 2.85 V
Brady Statistic AP VP Percent: 0.65 %
Brady Statistic AP VS Percent: 0 %
Brady Statistic AS VP Percent: 99.35 %
Brady Statistic AS VS Percent: 0 %
Brady Statistic RA Percent Paced: 0.65 %
Brady Statistic RV Percent Paced: 100 %
Date Time Interrogation Session: 20230301192741
Implantable Lead Implant Date: 20150212
Implantable Lead Implant Date: 20150212
Implantable Lead Implant Date: 20150212
Implantable Lead Location: 753858
Implantable Lead Location: 753859
Implantable Lead Location: 753860
Implantable Lead Model: 4194
Implantable Lead Model: 5076
Implantable Lead Model: 5076
Implantable Pulse Generator Implant Date: 20150212
Lead Channel Impedance Value: 342 Ohm
Lead Channel Impedance Value: 361 Ohm
Lead Channel Impedance Value: 380 Ohm
Lead Channel Impedance Value: 399 Ohm
Lead Channel Impedance Value: 418 Ohm
Lead Channel Impedance Value: 437 Ohm
Lead Channel Impedance Value: 494 Ohm
Lead Channel Impedance Value: 532 Ohm
Lead Channel Impedance Value: 532 Ohm
Lead Channel Pacing Threshold Amplitude: 0.75 V
Lead Channel Pacing Threshold Amplitude: 1 V
Lead Channel Pacing Threshold Amplitude: 1.875 V
Lead Channel Pacing Threshold Pulse Width: 0.4 ms
Lead Channel Pacing Threshold Pulse Width: 0.4 ms
Lead Channel Pacing Threshold Pulse Width: 0.4 ms
Lead Channel Sensing Intrinsic Amplitude: 1.875 mV
Lead Channel Sensing Intrinsic Amplitude: 1.875 mV
Lead Channel Sensing Intrinsic Amplitude: 19.25 mV
Lead Channel Sensing Intrinsic Amplitude: 19.25 mV
Lead Channel Setting Pacing Amplitude: 1.5 V
Lead Channel Setting Pacing Amplitude: 2 V
Lead Channel Setting Pacing Amplitude: 2.5 V
Lead Channel Setting Pacing Pulse Width: 0.4 ms
Lead Channel Setting Pacing Pulse Width: 0.8 ms
Lead Channel Setting Sensing Sensitivity: 0.9 mV

## 2021-06-20 NOTE — Telephone Encounter (Signed)
PPM battery checks scheduled for monthly d/t 8 months until ERI. Patient called made aware, dates provided to patient.  ?

## 2021-06-26 ENCOUNTER — Other Ambulatory Visit: Payer: Self-pay | Admitting: Internal Medicine

## 2021-06-26 MED ORDER — DIPHENOXYLATE-ATROPINE 2.5-0.025 MG PO TABS
1.0000 | ORAL_TABLET | Freq: Four times a day (QID) | ORAL | 1 refills | Status: DC | PRN
Start: 2021-06-26 — End: 2021-10-18

## 2021-06-26 NOTE — Progress Notes (Signed)
Remote pacemaker transmission.   

## 2021-07-22 ENCOUNTER — Ambulatory Visit (INDEPENDENT_AMBULATORY_CARE_PROVIDER_SITE_OTHER): Payer: HMO

## 2021-07-22 DIAGNOSIS — I429 Cardiomyopathy, unspecified: Secondary | ICD-10-CM

## 2021-07-22 LAB — CUP PACEART REMOTE DEVICE CHECK
Battery Remaining Longevity: 6 mo
Battery Voltage: 2.85 V
Brady Statistic AP VP Percent: 0.14 %
Brady Statistic AP VS Percent: 0 %
Brady Statistic AS VP Percent: 99.86 %
Brady Statistic AS VS Percent: 0 %
Brady Statistic RA Percent Paced: 0.14 %
Brady Statistic RV Percent Paced: 100 %
Date Time Interrogation Session: 20230403130426
Implantable Lead Implant Date: 20150212
Implantable Lead Implant Date: 20150212
Implantable Lead Implant Date: 20150212
Implantable Lead Location: 753858
Implantable Lead Location: 753859
Implantable Lead Location: 753860
Implantable Lead Model: 4194
Implantable Lead Model: 5076
Implantable Lead Model: 5076
Implantable Pulse Generator Implant Date: 20150212
Lead Channel Impedance Value: 323 Ohm
Lead Channel Impedance Value: 342 Ohm
Lead Channel Impedance Value: 361 Ohm
Lead Channel Impedance Value: 380 Ohm
Lead Channel Impedance Value: 418 Ohm
Lead Channel Impedance Value: 418 Ohm
Lead Channel Impedance Value: 456 Ohm
Lead Channel Impedance Value: 513 Ohm
Lead Channel Impedance Value: 532 Ohm
Lead Channel Pacing Threshold Amplitude: 0.75 V
Lead Channel Pacing Threshold Amplitude: 1.125 V
Lead Channel Pacing Threshold Amplitude: 1.875 V
Lead Channel Pacing Threshold Pulse Width: 0.4 ms
Lead Channel Pacing Threshold Pulse Width: 0.4 ms
Lead Channel Pacing Threshold Pulse Width: 0.4 ms
Lead Channel Sensing Intrinsic Amplitude: 1.625 mV
Lead Channel Sensing Intrinsic Amplitude: 1.625 mV
Lead Channel Sensing Intrinsic Amplitude: 19.25 mV
Lead Channel Sensing Intrinsic Amplitude: 19.25 mV
Lead Channel Setting Pacing Amplitude: 1.5 V
Lead Channel Setting Pacing Amplitude: 2.25 V
Lead Channel Setting Pacing Amplitude: 2.5 V
Lead Channel Setting Pacing Pulse Width: 0.4 ms
Lead Channel Setting Pacing Pulse Width: 0.8 ms
Lead Channel Setting Sensing Sensitivity: 0.9 mV

## 2021-08-01 NOTE — Progress Notes (Signed)
Remote pacemaker transmission.   

## 2021-08-02 ENCOUNTER — Other Ambulatory Visit: Payer: Self-pay | Admitting: Internal Medicine

## 2021-08-22 ENCOUNTER — Ambulatory Visit (INDEPENDENT_AMBULATORY_CARE_PROVIDER_SITE_OTHER): Payer: HMO

## 2021-08-22 DIAGNOSIS — I447 Left bundle-branch block, unspecified: Secondary | ICD-10-CM

## 2021-08-22 DIAGNOSIS — I429 Cardiomyopathy, unspecified: Secondary | ICD-10-CM

## 2021-08-22 LAB — CUP PACEART REMOTE DEVICE CHECK
Battery Remaining Longevity: 5 mo
Battery Voltage: 2.84 V
Brady Statistic AP VP Percent: 0.66 %
Brady Statistic AP VS Percent: 0 %
Brady Statistic AS VP Percent: 99.34 %
Brady Statistic AS VS Percent: 0 %
Brady Statistic RA Percent Paced: 0.66 %
Brady Statistic RV Percent Paced: 99.99 %
Date Time Interrogation Session: 20230504133306
Implantable Lead Implant Date: 20150212
Implantable Lead Implant Date: 20150212
Implantable Lead Implant Date: 20150212
Implantable Lead Location: 753858
Implantable Lead Location: 753859
Implantable Lead Location: 753860
Implantable Lead Model: 4194
Implantable Lead Model: 5076
Implantable Lead Model: 5076
Implantable Pulse Generator Implant Date: 20150212
Lead Channel Impedance Value: 342 Ohm
Lead Channel Impedance Value: 361 Ohm
Lead Channel Impedance Value: 361 Ohm
Lead Channel Impedance Value: 399 Ohm
Lead Channel Impedance Value: 418 Ohm
Lead Channel Impedance Value: 437 Ohm
Lead Channel Impedance Value: 475 Ohm
Lead Channel Impedance Value: 532 Ohm
Lead Channel Impedance Value: 551 Ohm
Lead Channel Pacing Threshold Amplitude: 0.75 V
Lead Channel Pacing Threshold Amplitude: 1 V
Lead Channel Pacing Threshold Amplitude: 1.875 V
Lead Channel Pacing Threshold Pulse Width: 0.4 ms
Lead Channel Pacing Threshold Pulse Width: 0.4 ms
Lead Channel Pacing Threshold Pulse Width: 0.4 ms
Lead Channel Sensing Intrinsic Amplitude: 1.625 mV
Lead Channel Sensing Intrinsic Amplitude: 1.625 mV
Lead Channel Sensing Intrinsic Amplitude: 19.25 mV
Lead Channel Sensing Intrinsic Amplitude: 19.25 mV
Lead Channel Setting Pacing Amplitude: 1.5 V
Lead Channel Setting Pacing Amplitude: 2 V
Lead Channel Setting Pacing Amplitude: 2.5 V
Lead Channel Setting Pacing Pulse Width: 0.4 ms
Lead Channel Setting Pacing Pulse Width: 0.8 ms
Lead Channel Setting Sensing Sensitivity: 0.9 mV

## 2021-08-30 ENCOUNTER — Other Ambulatory Visit (INDEPENDENT_AMBULATORY_CARE_PROVIDER_SITE_OTHER): Payer: HMO

## 2021-08-30 DIAGNOSIS — M791 Myalgia, unspecified site: Secondary | ICD-10-CM | POA: Diagnosis not present

## 2021-08-30 DIAGNOSIS — I700 Atherosclerosis of aorta: Secondary | ICD-10-CM | POA: Diagnosis not present

## 2021-08-30 DIAGNOSIS — E781 Pure hyperglyceridemia: Secondary | ICD-10-CM

## 2021-08-30 DIAGNOSIS — T466X5A Adverse effect of antihyperlipidemic and antiarteriosclerotic drugs, initial encounter: Secondary | ICD-10-CM | POA: Diagnosis not present

## 2021-08-30 DIAGNOSIS — E1169 Type 2 diabetes mellitus with other specified complication: Secondary | ICD-10-CM

## 2021-08-30 LAB — LIPID PANEL
Cholesterol: 208 mg/dL — ABNORMAL HIGH (ref 0–200)
HDL: 37.4 mg/dL — ABNORMAL LOW (ref >39.00)
NonHDL: 170.22
Total CHOL/HDL Ratio: 6
Triglycerides: 330 mg/dL — ABNORMAL HIGH (ref 0.0–149.0)
VLDL: 66 mg/dL — ABNORMAL HIGH (ref 0.0–40.0)

## 2021-08-30 LAB — COMPREHENSIVE METABOLIC PANEL
ALT: 13 U/L (ref 0–35)
AST: 16 U/L (ref 0–37)
Albumin: 4.2 g/dL (ref 3.5–5.2)
Alkaline Phosphatase: 100 U/L (ref 39–117)
BUN: 17 mg/dL (ref 6–23)
CO2: 28 mEq/L (ref 19–32)
Calcium: 9 mg/dL (ref 8.4–10.5)
Chloride: 104 mEq/L (ref 96–112)
Creatinine, Ser: 0.68 mg/dL (ref 0.40–1.20)
GFR: 90.32 mL/min (ref >60.00)
Glucose, Bld: 112 mg/dL — ABNORMAL HIGH (ref 70–99)
Potassium: 3.9 mEq/L (ref 3.5–5.1)
Sodium: 142 mEq/L (ref 135–145)
Total Bilirubin: 0.4 mg/dL (ref 0.2–1.2)
Total Protein: 6.4 g/dL (ref 6.0–8.3)

## 2021-08-30 LAB — LDL CHOLESTEROL, DIRECT: Direct LDL: 117 mg/dL

## 2021-08-30 LAB — HEMOGLOBIN A1C: Hgb A1c MFr Bld: 5.9 % (ref 4.6–6.5)

## 2021-09-03 ENCOUNTER — Ambulatory Visit (INDEPENDENT_AMBULATORY_CARE_PROVIDER_SITE_OTHER): Payer: HMO | Admitting: Internal Medicine

## 2021-09-03 ENCOUNTER — Encounter: Payer: Self-pay | Admitting: Internal Medicine

## 2021-09-03 VITALS — BP 130/76 | HR 73 | Temp 97.9°F | Ht 62.5 in | Wt 156.0 lb

## 2021-09-03 DIAGNOSIS — M4802 Spinal stenosis, cervical region: Secondary | ICD-10-CM

## 2021-09-03 DIAGNOSIS — I428 Other cardiomyopathies: Secondary | ICD-10-CM

## 2021-09-03 DIAGNOSIS — I10 Essential (primary) hypertension: Secondary | ICD-10-CM

## 2021-09-03 DIAGNOSIS — E1169 Type 2 diabetes mellitus with other specified complication: Secondary | ICD-10-CM

## 2021-09-03 DIAGNOSIS — K580 Irritable bowel syndrome with diarrhea: Secondary | ICD-10-CM | POA: Diagnosis not present

## 2021-09-03 DIAGNOSIS — E781 Pure hyperglyceridemia: Secondary | ICD-10-CM

## 2021-09-03 LAB — MICROALBUMIN / CREATININE URINE RATIO
Creatinine,U: 72.7 mg/dL
Microalb Creat Ratio: 1 mg/g (ref 0.0–30.0)
Microalb, Ur: 0.7 mg/dL (ref 0.0–1.9)

## 2021-09-03 MED ORDER — METHOCARBAMOL 500 MG PO TABS: 500.0000 mg | ORAL_TABLET | Freq: Four times a day (QID) | ORAL | 2 refills | Status: DC

## 2021-09-03 NOTE — Assessment & Plan Note (Signed)
Improved diarrhea pattern  With food avoidanacne and use of Imodium. but still unpredictable enough to discourage her from exercising.  Colonoscopy was normal.  Trying  to keep a food diary ?

## 2021-09-03 NOTE — Assessment & Plan Note (Addendum)
Reviewed her last visit with Dr Sherri Sear   ? ?Chronic systolic heart failure - her symptoms are class 2. She will continue her current meds. Her fluid was up in the fall but better now. ?2. CHB - she has no escape today. She is asymptomatic, s/p PPM ?3. Hair loss - this appears to be improved since switching to coreg ?4. HTN - her bp is improved. We will follow. ?

## 2021-09-03 NOTE — Assessment & Plan Note (Signed)
Currently well-controlled on current medications .  hemoglobin A1c is at goal of less than 6.0 . Patient is reminded to schedule an annual eye exam and foot exam is normal today. Patient has no microalbuminuria. Patient is toleratingACE/ARB for renal protection and hypertension .  She has deferred jardiance and CBG. .  Statin intolerant.   ?Lab Results  ?Component Value Date  ? HGBA1C 5.9 08/30/2021  ? ?Lab Results  ?Component Value Date  ? MICROALBUR 0.7 09/03/2021  ? MICROALBUR 1.5 04/02/2020  ? ? ? ?

## 2021-09-03 NOTE — Assessment & Plan Note (Addendum)
Improved with dietary avoidance  Of chocolate.  Fenofibrates and statin trials have  caused myalgia  ?

## 2021-09-03 NOTE — Assessment & Plan Note (Addendum)
recurrent symptoms have been limited to intermittent left sided neck spasms,  Right hand subjective weakness. Muscle relaxer refilled  ?

## 2021-09-03 NOTE — Progress Notes (Signed)
? ?Subjective:  ?Patient ID: Julia Pierce, female    DOB: 01/25/1956  Age: 66 y.o. MRN: [id number] ? ?CC: The primary encounter diagnosis was Type 2 diabetes mellitus with hypertriglyceridemia ([place]). Diagnoses of Hypertriglyceridemia, Irritable bowel syndrome with diarrhea, Cervical stenosis of spine, Primary hypertension, and Non-ischemic cardiomyopathy ([place]) were also pertinent to this visit. ? ? ?HPI ?Julia Pierce presents for  ?Chief Complaint  ?Patient presents with  ? Follow-up  ?  6 month follow up on diabetes  ? ?1)  T2DM:  She  feels generally well,  But is not  exercising regularly dur to intermittent diarrhea. Checking  blood sugars less than once daily at variable times, usually only if she feels she may be having a hypoglycemic event.  BS have been under 130 fasting and < 150 post prandially.  Denies any recent hypoglyemic events.  Taking   medications as directed. Following a carbohydrate modified diet 6 days per week. Denies numbness, burning and tingling of extremities. Appetite is good.  : ? ?2)  IBS: improved with dietary restrictions (corn, corn syrup, onions  etc)   but continue to have occasional uncontrolled diarrhea in spite of using lomotil before leaving the house. Wants to see an iBS specialist  ? ?3)  left sided neck muscle spasm,  recurred recently during a yawn.  Caused the top of the shoulder to feel numb for a few minutes.    Has a history cervical spinal stenosis,  surgery was planned in 2019 but cancelled because of a death in the family .  Denies left sided weakness  but feels that  the right hand is weak;  does not want to see neurosurgery . Not getting massages due to episodes of fecal urgency due to diarrhea and gas .  Tried a TENS unit in the past,  "it sent me into orbit"   ? ?4) discolored , dry  toenails:  using tallow (rendered fat from a grass fed cow)  .  ? ?5) dry mouth :  using biotene and another rinse . Potentially Aggravated by lomotil use ? ? ?Outpatient Medications  Prior to Visit  ?Medication Sig Dispense Refill  ? Alpha-D-Galactosidase (BEANO PO) Take by mouth. Before meals    ? amLODipine (NORVASC) 2.5 MG tablet TAKE 1 TABLET BY MOUTH EVERY DAY 90 tablet 3  ? Azelaic Acid 15 % cream APPLY TO AFFECTED AREA EVERY DAY    ? B Complex Vitamins (B COMPLEX PO) Take 1 tablet by mouth daily.     ? blood glucose meter kit and supplies Use to check blood sugars once daily daily. (FOR ICD-10 E11.9). 1 each 0  ? buPROPion (WELLBUTRIN XL) 150 MG 24 hr tablet TAKE 1 TABLET BY MOUTH EVERY DAY 90 tablet 1  ? calcium elemental as carbonate (BARIATRIC TUMS ULTRA) 400 MG chewable tablet Chew 400 mg by mouth daily.    ? carvedilol (COREG) 25 MG tablet Take 1 tablet (25 mg total) by mouth 2 (two) times daily. 180 tablet 3  ? Cholecalciferol (VITAMIN D3) 2000 UNITS TABS Take 1 tablet by mouth daily.    ? diphenoxylate-atropine (LOMOTIL) 2.5-0.025 MG tablet Take 1 tablet by mouth 4 (four) times daily as needed for diarrhea or loose stools. 30 tablet 1  ? esomeprazole (NEXIUM) 10 MG packet Take 10 mg by mouth daily.    ? fluticasone (FLONASE) 50 MCG/ACT nasal spray Place 2 sprays into both nostrils daily.    ? glucose blood test strip Use as instructed 100  each 12  ? losartan (COZAAR) 100 MG tablet Take 100 mg by mouth daily.    ? Multiple Vitamin (MULTIVITAMIN) tablet Take 1 tablet by mouth daily.    ? sertraline (ZOLOFT) 100 MG tablet TAKE 1 TABLET BY MOUTH EVERY DAY 90 tablet 1  ? tolterodine (DETROL) 2 MG tablet TAKE 1 TABLET BY MOUTH TWICE A DAY 180 tablet 3  ? ?Facility-Administered Medications Prior to Visit  ?Medication Dose Route Frequency Provider Last Rate Last Admin  ? diclofenac sodium (VOLTAREN) 1 % transdermal gel 4 g  4 g Topical QID Mohammed Kindle, MD      ? midazolam (VERSED) 5 MG/5ML injection 5 mg  5 mg Intravenous Once Mohammed Kindle, MD      ? orphenadrine (NORFLEX) injection 60 mg  60 mg Intramuscular Once Mohammed Kindle, MD      ? sodium chloride 0.9 % injection 20 mL  20 mL  Other Once Mohammed Kindle, MD      ? triamcinolone acetonide (KENALOG-40) injection 40 mg  40 mg Other Once Mohammed Kindle, MD      ? ? ?Review of Systems; ? ?Patient denies headache, fevers, malaise, unintentional weight loss, skin rash, eye pain, sinus congestion and sinus pain, sore throat, dysphagia,  hemoptysis , cough, dyspnea, wheezing, chest pain, palpitations, orthopnea, edema, abdominal pain, nausea, melena, diarrhea, constipation, flank pain, dysuria, hematuria, urinary  Frequency, nocturia, numbness, tingling, seizures,  Focal weakness, Loss of consciousness,  Tremor, insomnia, depression, anxiety, and suicidal ideation.   ? ? ? ?Objective:  ?BP 130/76 (BP Location: Left Arm, Patient Position: Sitting, Cuff Size: Normal)   Pulse 73   Temp 97.9 ?F (36.6 ?C) (Oral)   Ht 5' 2.5" (1.588 m)   Wt 156 lb (70.8 kg)   SpO2 95%   BMI 28.08 kg/m?  ? ?BP Readings from Last 3 Encounters:  ?09/03/21 130/76  ?05/09/21 124/72  ?03/20/21 132/74  ? ? ?Wt Readings from Last 3 Encounters:  ?09/03/21 156 lb (70.8 kg)  ?05/09/21 153 lb (69.4 kg)  ?03/21/21 156 lb (70.8 kg)  ? ? ?General appearance: alert, cooperative and appears stated age ?Ears: normal TM's and external ear canals both ears ?Throat: lips, mucosa, and tongue normal; teeth and gums normal ?Neck: no adenopathy, no carotid bruit, supple, symmetrical, trachea midline and thyroid not enlarged, symmetric, no tenderness/mass/nodules ?Back: symmetric, no curvature. ROM normal. No CVA tenderness. ?Lungs: clear to auscultation bilaterally ?Heart: regular rate and rhythm, S1, S2 normal, no murmur, click, rub or gallop ?Abdomen: soft, non-tender; bowel sounds normal; no masses,  no organomegaly ?Pulses: 2+ and symmetric ?Skin: Skin color, texture, turgor normal. No rashes or lesions ?Lymph nodes: Cervical, supraclavicular, and axillary nodes normal. ? ?Lab Results  ?Component Value Date  ? HGBA1C 5.9 08/30/2021  ? HGBA1C 6.1 02/18/2021  ? HGBA1C 6.1 11/13/2020   ? ? ?Lab Results  ?Component Value Date  ? CREATININE 0.68 08/30/2021  ? CREATININE 0.69 02/18/2021  ? CREATININE 0.74 11/13/2020  ? ? ?Lab Results  ?Component Value Date  ? WBC 8.0 09/11/2016  ? HGB 13.6 09/11/2016  ? HCT 40.8 09/11/2016  ? PLT 257.0 09/11/2016  ? GLUCOSE 112 (H) 08/30/2021  ? CHOL 208 (H) 08/30/2021  ? TRIG 330.0 (H) 08/30/2021  ? HDL 37.40 (L) 08/30/2021  ? LDLDIRECT 117.0 08/30/2021  ? [place] 88 08/17/2013  ? ALT 13 08/30/2021  ? AST 16 08/30/2021  ? NA 142 08/30/2021  ? K 3.9 08/30/2021  ? CL 104 08/30/2021  ?  CREATININE 0.68 08/30/2021  ? BUN 17 08/30/2021  ? CO2 28 08/30/2021  ? TSH 3.80 12/12/2016  ? HGBA1C 5.9 08/30/2021  ? MICROALBUR 0.7 09/03/2021  ? ? ?DG MOBILE BONE DENSITY ? ?Result Date: 08/06/2020 ?CLINICAL DATA:  Postmenopausal. History of L5-S1 posterior lumbar spine fusion. EXAM: DUAL X-RAY ABSORPTIOMETRY (DXA) FOR BONE MINERAL DENSITY TECHNIQUE: Bone mineral density measurements are performed of the spine, hip, and forearm, as appropriate, per International Society of Clinical Densitometry recommendations. The pertinent regions of interest are reported below. Non-contributory values are not reported. Images are obtained for bone mineral density measurement and are not obtained for diagnostic purposes. FINDINGS: AP LUMBAR SPINE L1 through L3 Bone Mineral Density (BMD):  1.009 g/cm2 Young Adult T-Score:  -0.1 Z-Score:  1.7 LEFT FEMUR NECK Bone Mineral Density (BMD):  0.516 g/cm2 Young Adult T-Score: -3.0 Z-Score:  -1.4 LEFT FOREARM (1/3 RADIUS) Bone Mineral Density (BMD):  0.452 Young Adult T Score:  -4.0 Z Score:  -2.3 Unit: This study was performed at [facility] on the [place] (S/N ([phone]), software version 13.4.2. Scan quality: The scan quality is good. Exclusions: L5 excluded due to surgical hardware. ASSESSMENT: Patient's diagnostic category is OSTEOPOROSIS by WHO Criteria. FRACTURE RISK: INCREASED FRAX: World Health Organization FRAX assessment of  absolute fracture risk is not calculated for this patient because the patient has osteoporosis. COMPARISON: None. RECOMMENDATIONS 1. All patients should optimize calcium and vitamin D intake. 2. Consider FDA-approved

## 2021-09-03 NOTE — Assessment & Plan Note (Signed)
Well controlled on current regimen of amlodipine 2.5 mg ,  Metoprolol 25 mg bid,  And losartan 100 mg daily . Renal function stable, no changes today. ? ?Lab Results  ?Component Value Date  ? CREATININE 0.68 08/30/2021  ? ?Lab Results  ?Component Value Date  ? NA 142 08/30/2021  ? K 3.9 08/30/2021  ? CL 104 08/30/2021  ? CO2 28 08/30/2021  ? ? ?

## 2021-09-03 NOTE — Assessment & Plan Note (Signed)
?  Aortic atherosclerosis :  Discussed need for statin therapy given documented evidence of moderate  atherosclerosis in the aorta noted on recent  CT of abdomen and  pelvis and the prognostic implications of this finding. She is statin intolerant due to myalgias  ?

## 2021-09-03 NOTE — Addendum Note (Signed)
Addended by: Crecencio Mc on: 09/03/2021 09:40 PM ? ? Modules accepted: Orders ? ?

## 2021-09-03 NOTE — Patient Instructions (Addendum)
I am making a referral to Dr Larey [facility] for help in treating  your IBS  ? ?The Lomotil can be causing your dry mouth., because t contains atropine.   Try using 2 Imodium instead before your leave the house.  ? ? ?I  have  refilled Robaxin , a muscle relaxer for your neck  ?

## 2021-09-04 NOTE — Progress Notes (Signed)
Remote pacemaker transmission.   

## 2021-09-22 LAB — CUP PACEART REMOTE DEVICE CHECK
Battery Remaining Longevity: 5 mo
Battery Voltage: 2.83 V
Brady Statistic AP VP Percent: 1.31 %
Brady Statistic AP VS Percent: 0 %
Brady Statistic AS VP Percent: 98.68 %
Brady Statistic AS VS Percent: 0.01 %
Brady Statistic RA Percent Paced: 1.31 %
Brady Statistic RV Percent Paced: 99.93 %
Date Time Interrogation Session: 20230604185109
Implantable Lead Implant Date: 20150212
Implantable Lead Implant Date: 20150212
Implantable Lead Implant Date: 20150212
Implantable Lead Location: 753858
Implantable Lead Location: 753859
Implantable Lead Location: 753860
Implantable Lead Model: 4194
Implantable Lead Model: 5076
Implantable Lead Model: 5076
Implantable Pulse Generator Implant Date: 20150212
Lead Channel Impedance Value: 304 Ohm
Lead Channel Impedance Value: 342 Ohm
Lead Channel Impedance Value: 361 Ohm
Lead Channel Impedance Value: 361 Ohm
Lead Channel Impedance Value: 399 Ohm
Lead Channel Impedance Value: 418 Ohm
Lead Channel Impedance Value: 437 Ohm
Lead Channel Impedance Value: 513 Ohm
Lead Channel Impedance Value: 513 Ohm
Lead Channel Pacing Threshold Amplitude: 0.625 V
Lead Channel Pacing Threshold Amplitude: 1.125 V
Lead Channel Pacing Threshold Amplitude: 1.875 V
Lead Channel Pacing Threshold Pulse Width: 0.4 ms
Lead Channel Pacing Threshold Pulse Width: 0.4 ms
Lead Channel Pacing Threshold Pulse Width: 0.4 ms
Lead Channel Sensing Intrinsic Amplitude: 0.875 mV
Lead Channel Sensing Intrinsic Amplitude: 0.875 mV
Lead Channel Sensing Intrinsic Amplitude: 19.25 mV
Lead Channel Sensing Intrinsic Amplitude: 19.25 mV
Lead Channel Setting Pacing Amplitude: 1.5 V
Lead Channel Setting Pacing Amplitude: 2.25 V
Lead Channel Setting Pacing Amplitude: 2.5 V
Lead Channel Setting Pacing Pulse Width: 0.4 ms
Lead Channel Setting Pacing Pulse Width: 0.8 ms
Lead Channel Setting Sensing Sensitivity: 0.9 mV

## 2021-10-08 DIAGNOSIS — Z0142 Encounter for cervical smear to confirm findings of recent normal smear following initial abnormal smear: Secondary | ICD-10-CM | POA: Diagnosis not present

## 2021-10-08 DIAGNOSIS — Z124 Encounter for screening for malignant neoplasm of cervix: Secondary | ICD-10-CM | POA: Diagnosis not present

## 2021-10-08 DIAGNOSIS — Z6825 Body mass index (BMI) 25.0-25.9, adult: Secondary | ICD-10-CM | POA: Diagnosis not present

## 2021-10-08 DIAGNOSIS — Z01419 Encounter for gynecological examination (general) (routine) without abnormal findings: Secondary | ICD-10-CM | POA: Diagnosis not present

## 2021-10-08 DIAGNOSIS — Z01411 Encounter for gynecological examination (general) (routine) with abnormal findings: Secondary | ICD-10-CM | POA: Diagnosis not present

## 2021-10-08 DIAGNOSIS — Z1231 Encounter for screening mammogram for malignant neoplasm of breast: Secondary | ICD-10-CM | POA: Diagnosis not present

## 2021-10-08 LAB — HM MAMMOGRAPHY

## 2021-10-11 ENCOUNTER — Encounter: Payer: Self-pay | Admitting: Gastroenterology

## 2021-10-11 ENCOUNTER — Ambulatory Visit: Payer: HMO | Admitting: Gastroenterology

## 2021-10-11 VITALS — BP 124/78 | HR 80 | Ht 62.0 in | Wt 158.4 lb

## 2021-10-11 DIAGNOSIS — K580 Irritable bowel syndrome with diarrhea: Secondary | ICD-10-CM

## 2021-10-11 DIAGNOSIS — R140 Abdominal distension (gaseous): Secondary | ICD-10-CM

## 2021-10-18 ENCOUNTER — Other Ambulatory Visit: Payer: Self-pay | Admitting: Internal Medicine

## 2021-10-18 NOTE — Telephone Encounter (Signed)
Pt has enough to get her through till Monday.   Refilled: 06/26/2021 Last OV: 09/03/2021 Next OV: 03/06/2022

## 2021-10-23 ENCOUNTER — Ambulatory Visit (INDEPENDENT_AMBULATORY_CARE_PROVIDER_SITE_OTHER): Payer: HMO

## 2021-10-23 DIAGNOSIS — I428 Other cardiomyopathies: Secondary | ICD-10-CM

## 2021-10-24 LAB — CUP PACEART REMOTE DEVICE CHECK
Battery Remaining Longevity: 4 mo
Battery Voltage: 2.83 V
Brady Statistic AP VP Percent: 0.33 %
Brady Statistic AP VS Percent: 0 %
Brady Statistic AS VP Percent: 99.66 %
Brady Statistic AS VS Percent: 0 %
Brady Statistic RA Percent Paced: 0.33 %
Brady Statistic RV Percent Paced: 99.99 %
Date Time Interrogation Session: 20230705132352
Implantable Lead Implant Date: 20150212
Implantable Lead Implant Date: 20150212
Implantable Lead Implant Date: 20150212
Implantable Lead Location: 753858
Implantable Lead Location: 753859
Implantable Lead Location: 753860
Implantable Lead Model: 4194
Implantable Lead Model: 5076
Implantable Lead Model: 5076
Implantable Pulse Generator Implant Date: 20150212
Lead Channel Impedance Value: 342 Ohm
Lead Channel Impedance Value: 361 Ohm
Lead Channel Impedance Value: 380 Ohm
Lead Channel Impedance Value: 399 Ohm
Lead Channel Impedance Value: 437 Ohm
Lead Channel Impedance Value: 456 Ohm
Lead Channel Impedance Value: 475 Ohm
Lead Channel Impedance Value: 551 Ohm
Lead Channel Impedance Value: 570 Ohm
Lead Channel Pacing Threshold Amplitude: 0.75 V
Lead Channel Pacing Threshold Amplitude: 1 V
Lead Channel Pacing Threshold Amplitude: 1.875 V
Lead Channel Pacing Threshold Pulse Width: 0.4 ms
Lead Channel Pacing Threshold Pulse Width: 0.4 ms
Lead Channel Pacing Threshold Pulse Width: 0.4 ms
Lead Channel Sensing Intrinsic Amplitude: 19.25 mV
Lead Channel Sensing Intrinsic Amplitude: 19.25 mV
Lead Channel Sensing Intrinsic Amplitude: 2.25 mV
Lead Channel Sensing Intrinsic Amplitude: 2.25 mV
Lead Channel Setting Pacing Amplitude: 1.5 V
Lead Channel Setting Pacing Amplitude: 2.25 V
Lead Channel Setting Pacing Amplitude: 2.5 V
Lead Channel Setting Pacing Pulse Width: 0.4 ms
Lead Channel Setting Pacing Pulse Width: 0.8 ms
Lead Channel Setting Sensing Sensitivity: 0.9 mV

## 2021-10-31 ENCOUNTER — Other Ambulatory Visit: Payer: Self-pay | Admitting: Internal Medicine

## 2021-10-31 DIAGNOSIS — D3131 Benign neoplasm of right choroid: Secondary | ICD-10-CM | POA: Diagnosis not present

## 2021-10-31 LAB — HM DIABETES EYE EXAM

## 2021-11-11 ENCOUNTER — Other Ambulatory Visit: Payer: Self-pay | Admitting: Internal Medicine

## 2021-11-12 NOTE — Progress Notes (Signed)
Remote pacemaker transmission.   

## 2021-11-12 NOTE — Addendum Note (Signed)
Addended by: Cheri Kearns A on: 11/12/2021 03:28 PM   Modules accepted: Level of Service

## 2021-11-20 LAB — HM DIABETES EYE EXAM

## 2021-11-21 ENCOUNTER — Encounter: Payer: Self-pay | Admitting: Internal Medicine

## 2021-11-21 NOTE — Progress Notes (Signed)
Diabetic eye exam abstracted and sent to scan.

## 2021-11-23 ENCOUNTER — Ambulatory Visit: Payer: HMO

## 2021-11-26 LAB — CUP PACEART REMOTE DEVICE CHECK
Battery Remaining Longevity: 3 mo
Battery Voltage: 2.82 V
Brady Statistic AP VP Percent: 0.51 %
Brady Statistic AP VS Percent: 0 %
Brady Statistic AS VP Percent: 99.48 %
Brady Statistic AS VS Percent: 0 %
Brady Statistic RA Percent Paced: 0.51 %
Brady Statistic RV Percent Paced: 99.99 %
Date Time Interrogation Session: 20230805133432
Implantable Lead Implant Date: 20150212
Implantable Lead Implant Date: 20150212
Implantable Lead Implant Date: 20150212
Implantable Lead Location: 753858
Implantable Lead Location: 753859
Implantable Lead Location: 753860
Implantable Lead Model: 4194
Implantable Lead Model: 5076
Implantable Lead Model: 5076
Implantable Pulse Generator Implant Date: 20150212
Lead Channel Impedance Value: 323 Ohm
Lead Channel Impedance Value: 342 Ohm
Lead Channel Impedance Value: 380 Ohm
Lead Channel Impedance Value: 380 Ohm
Lead Channel Impedance Value: 437 Ohm
Lead Channel Impedance Value: 437 Ohm
Lead Channel Impedance Value: 456 Ohm
Lead Channel Impedance Value: 532 Ohm
Lead Channel Impedance Value: 551 Ohm
Lead Channel Pacing Threshold Amplitude: 0.625 V
Lead Channel Pacing Threshold Amplitude: 1.125 V
Lead Channel Pacing Threshold Amplitude: 1.875 V
Lead Channel Pacing Threshold Pulse Width: 0.4 ms
Lead Channel Pacing Threshold Pulse Width: 0.4 ms
Lead Channel Pacing Threshold Pulse Width: 0.4 ms
Lead Channel Sensing Intrinsic Amplitude: 1.125 mV
Lead Channel Sensing Intrinsic Amplitude: 1.125 mV
Lead Channel Sensing Intrinsic Amplitude: 19.25 mV
Lead Channel Sensing Intrinsic Amplitude: 19.25 mV
Lead Channel Setting Pacing Amplitude: 1.5 V
Lead Channel Setting Pacing Amplitude: 2.25 V
Lead Channel Setting Pacing Amplitude: 2.5 V
Lead Channel Setting Pacing Pulse Width: 0.4 ms
Lead Channel Setting Pacing Pulse Width: 0.8 ms
Lead Channel Setting Sensing Sensitivity: 0.9 mV

## 2021-12-03 DIAGNOSIS — L578 Other skin changes due to chronic exposure to nonionizing radiation: Secondary | ICD-10-CM | POA: Diagnosis not present

## 2021-12-03 DIAGNOSIS — L570 Actinic keratosis: Secondary | ICD-10-CM | POA: Diagnosis not present

## 2021-12-03 DIAGNOSIS — L718 Other rosacea: Secondary | ICD-10-CM | POA: Diagnosis not present

## 2021-12-03 DIAGNOSIS — Z872 Personal history of diseases of the skin and subcutaneous tissue: Secondary | ICD-10-CM | POA: Diagnosis not present

## 2021-12-03 DIAGNOSIS — Z86018 Personal history of other benign neoplasm: Secondary | ICD-10-CM | POA: Diagnosis not present

## 2021-12-24 ENCOUNTER — Ambulatory Visit (INDEPENDENT_AMBULATORY_CARE_PROVIDER_SITE_OTHER): Payer: HMO

## 2021-12-24 DIAGNOSIS — I428 Other cardiomyopathies: Secondary | ICD-10-CM | POA: Diagnosis not present

## 2021-12-27 LAB — CUP PACEART REMOTE DEVICE CHECK
Battery Remaining Longevity: 3 mo
Battery Voltage: 2.81 V
Brady Statistic AP VP Percent: 0.44 %
Brady Statistic AP VS Percent: 0 %
Brady Statistic AS VP Percent: 99.55 %
Brady Statistic AS VS Percent: 0 %
Brady Statistic RA Percent Paced: 0.44 %
Brady Statistic RV Percent Paced: 99.99 %
Date Time Interrogation Session: 20230905132836
Implantable Lead Implant Date: 20150212
Implantable Lead Implant Date: 20150212
Implantable Lead Implant Date: 20150212
Implantable Lead Location: 753858
Implantable Lead Location: 753859
Implantable Lead Location: 753860
Implantable Lead Model: 4194
Implantable Lead Model: 5076
Implantable Lead Model: 5076
Implantable Pulse Generator Implant Date: 20150212
Lead Channel Impedance Value: 342 Ohm
Lead Channel Impedance Value: 361 Ohm
Lead Channel Impedance Value: 380 Ohm
Lead Channel Impedance Value: 399 Ohm
Lead Channel Impedance Value: 437 Ohm
Lead Channel Impedance Value: 437 Ohm
Lead Channel Impedance Value: 456 Ohm
Lead Channel Impedance Value: 532 Ohm
Lead Channel Impedance Value: 551 Ohm
Lead Channel Pacing Threshold Amplitude: 0.625 V
Lead Channel Pacing Threshold Amplitude: 1 V
Lead Channel Pacing Threshold Amplitude: 1.875 V
Lead Channel Pacing Threshold Pulse Width: 0.4 ms
Lead Channel Pacing Threshold Pulse Width: 0.4 ms
Lead Channel Pacing Threshold Pulse Width: 0.4 ms
Lead Channel Sensing Intrinsic Amplitude: 1.375 mV
Lead Channel Sensing Intrinsic Amplitude: 1.375 mV
Lead Channel Sensing Intrinsic Amplitude: 19.25 mV
Lead Channel Sensing Intrinsic Amplitude: 19.25 mV
Lead Channel Setting Pacing Amplitude: 1.5 V
Lead Channel Setting Pacing Amplitude: 2 V
Lead Channel Setting Pacing Amplitude: 2.5 V
Lead Channel Setting Pacing Pulse Width: 0.4 ms
Lead Channel Setting Pacing Pulse Width: 0.8 ms
Lead Channel Setting Sensing Sensitivity: 0.9 mV

## 2022-01-13 NOTE — Progress Notes (Signed)
Remote pacemaker transmission.   

## 2022-01-24 ENCOUNTER — Ambulatory Visit (INDEPENDENT_AMBULATORY_CARE_PROVIDER_SITE_OTHER): Payer: HMO

## 2022-01-24 DIAGNOSIS — I428 Other cardiomyopathies: Secondary | ICD-10-CM

## 2022-01-24 LAB — CUP PACEART REMOTE DEVICE CHECK
Battery Remaining Longevity: 3 mo
Battery Voltage: 2.8 V
Brady Statistic AP VP Percent: 0.99 %
Brady Statistic AP VS Percent: 0 %
Brady Statistic AS VP Percent: 99.01 %
Brady Statistic AS VS Percent: 0 %
Brady Statistic RA Percent Paced: 0.99 %
Brady Statistic RV Percent Paced: 99.99 %
Date Time Interrogation Session: 20231006140503
Implantable Lead Implant Date: 20150212
Implantable Lead Implant Date: 20150212
Implantable Lead Implant Date: 20150212
Implantable Lead Location: 753858
Implantable Lead Location: 753859
Implantable Lead Location: 753860
Implantable Lead Model: 4194
Implantable Lead Model: 5076
Implantable Lead Model: 5076
Implantable Pulse Generator Implant Date: 20150212
Lead Channel Impedance Value: 342 Ohm
Lead Channel Impedance Value: 342 Ohm
Lead Channel Impedance Value: 380 Ohm
Lead Channel Impedance Value: 380 Ohm
Lead Channel Impedance Value: 418 Ohm
Lead Channel Impedance Value: 437 Ohm
Lead Channel Impedance Value: 456 Ohm
Lead Channel Impedance Value: 532 Ohm
Lead Channel Impedance Value: 551 Ohm
Lead Channel Pacing Threshold Amplitude: 0.625 V
Lead Channel Pacing Threshold Amplitude: 0.875 V
Lead Channel Pacing Threshold Amplitude: 1.875 V
Lead Channel Pacing Threshold Pulse Width: 0.4 ms
Lead Channel Pacing Threshold Pulse Width: 0.4 ms
Lead Channel Pacing Threshold Pulse Width: 0.4 ms
Lead Channel Sensing Intrinsic Amplitude: 0.875 mV
Lead Channel Sensing Intrinsic Amplitude: 0.875 mV
Lead Channel Sensing Intrinsic Amplitude: 17.5 mV
Lead Channel Sensing Intrinsic Amplitude: 17.5 mV
Lead Channel Setting Pacing Amplitude: 1.5 V
Lead Channel Setting Pacing Amplitude: 2 V
Lead Channel Setting Pacing Amplitude: 2.5 V
Lead Channel Setting Pacing Pulse Width: 0.4 ms
Lead Channel Setting Pacing Pulse Width: 0.8 ms
Lead Channel Setting Sensing Sensitivity: 0.9 mV

## 2022-01-28 NOTE — Progress Notes (Signed)
Remote pacemaker transmission.   

## 2022-02-24 ENCOUNTER — Ambulatory Visit (INDEPENDENT_AMBULATORY_CARE_PROVIDER_SITE_OTHER): Payer: HMO

## 2022-02-24 DIAGNOSIS — I429 Cardiomyopathy, unspecified: Secondary | ICD-10-CM

## 2022-02-24 LAB — CUP PACEART REMOTE DEVICE CHECK
Battery Remaining Longevity: 3 mo
Battery Voltage: 2.79 V
Brady Statistic AP VP Percent: 0.76 %
Brady Statistic AP VS Percent: 0 %
Brady Statistic AS VP Percent: 99.23 %
Brady Statistic AS VS Percent: 0 %
Brady Statistic RA Percent Paced: 0.76 %
Brady Statistic RV Percent Paced: 100 %
Date Time Interrogation Session: 20231106134239
Implantable Lead Connection Status: 753985
Implantable Lead Connection Status: 753985
Implantable Lead Connection Status: 753985
Implantable Lead Implant Date: 20150212
Implantable Lead Implant Date: 20150212
Implantable Lead Implant Date: 20150212
Implantable Lead Location: 753858
Implantable Lead Location: 753859
Implantable Lead Location: 753860
Implantable Lead Model: 4194
Implantable Lead Model: 5076
Implantable Lead Model: 5076
Implantable Pulse Generator Implant Date: 20150212
Lead Channel Impedance Value: 342 Ohm
Lead Channel Impedance Value: 342 Ohm
Lead Channel Impedance Value: 361 Ohm
Lead Channel Impedance Value: 399 Ohm
Lead Channel Impedance Value: 437 Ohm
Lead Channel Impedance Value: 437 Ohm
Lead Channel Impedance Value: 456 Ohm
Lead Channel Impedance Value: 532 Ohm
Lead Channel Impedance Value: 551 Ohm
Lead Channel Pacing Threshold Amplitude: 0.625 V
Lead Channel Pacing Threshold Amplitude: 0.875 V
Lead Channel Pacing Threshold Amplitude: 1.875 V
Lead Channel Pacing Threshold Pulse Width: 0.4 ms
Lead Channel Pacing Threshold Pulse Width: 0.4 ms
Lead Channel Pacing Threshold Pulse Width: 0.4 ms
Lead Channel Sensing Intrinsic Amplitude: 1 mV
Lead Channel Sensing Intrinsic Amplitude: 1 mV
Lead Channel Sensing Intrinsic Amplitude: 17.5 mV
Lead Channel Sensing Intrinsic Amplitude: 17.5 mV
Lead Channel Setting Pacing Amplitude: 1.5 V
Lead Channel Setting Pacing Amplitude: 2 V
Lead Channel Setting Pacing Amplitude: 2.5 V
Lead Channel Setting Pacing Pulse Width: 0.4 ms
Lead Channel Setting Pacing Pulse Width: 0.8 ms
Lead Channel Setting Sensing Sensitivity: 0.9 mV
Zone Setting Status: 755011
Zone Setting Status: 755011

## 2022-02-27 ENCOUNTER — Telehealth: Payer: Self-pay | Admitting: Internal Medicine

## 2022-02-27 DIAGNOSIS — E781 Pure hyperglyceridemia: Secondary | ICD-10-CM

## 2022-02-27 DIAGNOSIS — E1169 Type 2 diabetes mellitus with other specified complication: Secondary | ICD-10-CM

## 2022-02-27 DIAGNOSIS — I10 Essential (primary) hypertension: Secondary | ICD-10-CM

## 2022-02-27 NOTE — Telephone Encounter (Signed)
Labs have been ordered

## 2022-02-27 NOTE — Telephone Encounter (Signed)
Patient has a lab appt 03/03/2022, there are no orders in.

## 2022-02-27 NOTE — Addendum Note (Signed)
Addended by: Adair Laundry on: 02/27/2022 02:56 PM   Modules accepted: Orders

## 2022-03-03 ENCOUNTER — Other Ambulatory Visit (INDEPENDENT_AMBULATORY_CARE_PROVIDER_SITE_OTHER): Payer: HMO

## 2022-03-03 DIAGNOSIS — E1169 Type 2 diabetes mellitus with other specified complication: Secondary | ICD-10-CM | POA: Diagnosis not present

## 2022-03-03 DIAGNOSIS — E781 Pure hyperglyceridemia: Secondary | ICD-10-CM | POA: Diagnosis not present

## 2022-03-03 DIAGNOSIS — I10 Essential (primary) hypertension: Secondary | ICD-10-CM

## 2022-03-03 LAB — COMPREHENSIVE METABOLIC PANEL
ALT: 13 U/L (ref 0–35)
AST: 15 U/L (ref 0–37)
Albumin: 4.2 g/dL (ref 3.5–5.2)
Alkaline Phosphatase: 104 U/L (ref 39–117)
BUN: 18 mg/dL (ref 6–23)
CO2: 30 mEq/L (ref 19–32)
Calcium: 9.2 mg/dL (ref 8.4–10.5)
Chloride: 105 mEq/L (ref 96–112)
Creatinine, Ser: 0.76 mg/dL (ref 0.40–1.20)
GFR: 80.98 mL/min (ref >60.00)
Glucose, Bld: 125 mg/dL — ABNORMAL HIGH (ref 70–99)
Potassium: 4.4 mEq/L (ref 3.5–5.1)
Sodium: 143 mEq/L (ref 135–145)
Total Bilirubin: 0.4 mg/dL (ref 0.2–1.2)
Total Protein: 6.4 g/dL (ref 6.0–8.3)

## 2022-03-03 LAB — LIPID PANEL
Cholesterol: 212 mg/dL — ABNORMAL HIGH (ref 0–200)
HDL: 40.1 mg/dL (ref >39.00)
NonHDL: 171.4
Total CHOL/HDL Ratio: 5
Triglycerides: 297 mg/dL — ABNORMAL HIGH (ref 0.0–149.0)
VLDL: 59.4 mg/dL — ABNORMAL HIGH (ref 0.0–40.0)

## 2022-03-03 LAB — HEMOGLOBIN A1C: Hgb A1c MFr Bld: 6 % (ref 4.6–6.5)

## 2022-03-03 LAB — LDL CHOLESTEROL, DIRECT: Direct LDL: 119 mg/dL

## 2022-03-06 ENCOUNTER — Ambulatory Visit: Payer: HMO | Admitting: Internal Medicine

## 2022-03-11 ENCOUNTER — Ambulatory Visit: Payer: HMO | Admitting: Internal Medicine

## 2022-03-17 ENCOUNTER — Other Ambulatory Visit: Payer: Self-pay | Admitting: Internal Medicine

## 2022-03-24 ENCOUNTER — Other Ambulatory Visit: Payer: Self-pay | Admitting: Internal Medicine

## 2022-03-24 ENCOUNTER — Encounter: Payer: Self-pay | Admitting: Internal Medicine

## 2022-03-24 ENCOUNTER — Ambulatory Visit (INDEPENDENT_AMBULATORY_CARE_PROVIDER_SITE_OTHER): Payer: HMO | Admitting: Internal Medicine

## 2022-03-24 VITALS — BP 120/80 | HR 71 | Temp 98.1°F | Ht 62.0 in | Wt 159.0 lb

## 2022-03-24 DIAGNOSIS — E1169 Type 2 diabetes mellitus with other specified complication: Secondary | ICD-10-CM | POA: Diagnosis not present

## 2022-03-24 DIAGNOSIS — N3281 Overactive bladder: Secondary | ICD-10-CM

## 2022-03-24 DIAGNOSIS — K580 Irritable bowel syndrome with diarrhea: Secondary | ICD-10-CM | POA: Diagnosis not present

## 2022-03-24 DIAGNOSIS — F320 Major depressive disorder, single episode, mild: Secondary | ICD-10-CM

## 2022-03-24 DIAGNOSIS — N819 Female genital prolapse, unspecified: Secondary | ICD-10-CM

## 2022-03-24 DIAGNOSIS — Z23 Encounter for immunization: Secondary | ICD-10-CM | POA: Diagnosis not present

## 2022-03-24 DIAGNOSIS — E781 Pure hyperglyceridemia: Secondary | ICD-10-CM

## 2022-03-24 DIAGNOSIS — I4581 Long QT syndrome: Secondary | ICD-10-CM

## 2022-03-24 DIAGNOSIS — I428 Other cardiomyopathies: Secondary | ICD-10-CM

## 2022-03-24 DIAGNOSIS — I700 Atherosclerosis of aorta: Secondary | ICD-10-CM | POA: Diagnosis not present

## 2022-03-24 MED ORDER — GLUCOSE BLOOD VI STRP: ORAL_STRIP | 12 refills | Status: DC

## 2022-03-24 NOTE — Patient Instructions (Signed)
Your diabetes remains under excellent control  And your cholesterol and other labs are also normal. Please continue your current medications. return in 3 months for follow up on diabetes and make sure you are seeing your eye doctor at least once a year for a dilated retina exam to monitor for diabetic retinopathy, changes that can lead to blindness .

## 2022-03-24 NOTE — Progress Notes (Signed)
Remote pacemaker transmission.   

## 2022-03-24 NOTE — Progress Notes (Unsigned)
Subjective:  Patient ID: Julia Pierce, female    DOB: 02/17/1956  Age: 66 y.o. MRN: [id number]  CC: There were no encounter diagnoses.   HPI Julia Pierce presents for  Chief Complaint  Patient presents with   Follow-up    6 month follow up     1) Type 2 DM: following a low GI diet.   2)  HTN: home readings have been < 130/80 and as low as 118/82   3) anxiety /depression :  doing well in spite of increased stressors: her 61 yr old  sister in law diagnosed with colon cancer stage 4,and her brother , 63  has been diagnosed with prostate CA.   Her husband is exhibiting some cognitive issues  and is argumentative.  She is tolerating current doses of sertraline and wellbutrin   3) Chronic diarrhea:  using Lomotil prn, not daily .  Using Imodium as prophylactics .  Triggered by gluten , artificial sweeteners , even fruit (apples)  has triggered it.  Can't eat an   Outpatient Medications Prior to Visit  Medication Sig Dispense Refill   Alpha-D-Galactosidase (BEANO PO) Take by mouth. Before meals     amLODipine (NORVASC) 2.5 MG tablet TAKE 1 TABLET BY MOUTH EVERY DAY 90 tablet 3   Azelaic Acid 15 % cream APPLY TO AFFECTED AREA EVERY DAY     B Complex Vitamins (B COMPLEX PO) Take 1 tablet by mouth daily.      blood glucose meter kit and supplies Use to check blood sugars once daily daily. (FOR ICD-10 E11.9). 1 each 0   buPROPion (WELLBUTRIN XL) 150 MG 24 hr tablet TAKE 1 TABLET BY MOUTH EVERY DAY 90 tablet 1   calcium elemental as carbonate (BARIATRIC TUMS ULTRA) 400 MG chewable tablet Chew 400 mg by mouth daily.     carvedilol (COREG) 25 MG tablet Take 1 tablet (25 mg total) by mouth 2 (two) times daily. 180 tablet 3   Cholecalciferol (VITAMIN D3) 2000 UNITS TABS Take 1 tablet by mouth daily.     diphenoxylate-atropine (LOMOTIL) 2.5-0.025 MG tablet TAKE 1 TABLET BY MOUTH 4 (FOUR) TIMES DAILY AS NEEDED FOR DIARRHEA OR LOOSE STOOLS. 30 tablet 1   esomeprazole (NEXIUM) 10 MG packet Take 10  mg by mouth daily.     fluticasone (FLONASE) 50 MCG/ACT nasal spray Place 2 sprays into both nostrils daily.     glucose blood test strip Use as instructed 100 each 12   losartan (COZAAR) 100 MG tablet Take 100 mg by mouth daily.     methocarbamol (ROBAXIN) 500 MG tablet TAKE 1 TABLET BY MOUTH 4 TIMES DAILY. 90 tablet 2   Multiple Vitamin (MULTIVITAMIN) tablet Take 1 tablet by mouth daily.     sertraline (ZOLOFT) 100 MG tablet TAKE 1 TABLET BY MOUTH EVERY DAY 90 tablet 1   tolterodine (DETROL) 2 MG tablet TAKE 1 TABLET BY MOUTH TWICE A DAY 180 tablet 3   Facility-Administered Medications Prior to Visit  Medication Dose Route Frequency Provider Last Rate Last Admin   diclofenac sodium (VOLTAREN) 1 % transdermal gel 4 g  4 g Topical QID Mohammed Kindle, MD       midazolam (VERSED) 5 MG/5ML injection 5 mg  5 mg Intravenous Once Mohammed Kindle, MD       orphenadrine (NORFLEX) injection 60 mg  60 mg Intramuscular Once Mohammed Kindle, MD       sodium chloride 0.9 % injection 20 mL  20 mL Other Once [place],  Belenda Cruise, MD       triamcinolone acetonide (KENALOG-40) injection 40 mg  40 mg Other Once Mohammed Kindle, MD        Review of Systems;  Patient denies headache, fevers, malaise, unintentional weight loss, skin rash, eye pain, sinus congestion and sinus pain, sore throat, dysphagia,  hemoptysis , cough, dyspnea, wheezing, chest pain, palpitations, orthopnea, edema, abdominal pain, nausea, melena, diarrhea, constipation, flank pain, dysuria, hematuria, urinary  Frequency, nocturia, numbness, tingling, seizures,  Focal weakness, Loss of consciousness,  Tremor, insomnia, depression, anxiety, and suicidal ideation.      Objective:  There were no vitals taken for this visit.  BP Readings from Last 3 Encounters:  10/11/21 124/78  09/03/21 130/76  05/09/21 124/72    Wt Readings from Last 3 Encounters:  10/11/21 158 lb 6 oz (71.8 kg)  09/03/21 156 lb (70.8 kg)  05/09/21 153 lb (69.4 kg)     General appearance: alert, cooperative and appears stated age Ears: normal TM's and external ear canals both ears Throat: lips, mucosa, and tongue normal; teeth and gums normal Neck: no adenopathy, no carotid bruit, supple, symmetrical, trachea midline and thyroid not enlarged, symmetric, no tenderness/mass/nodules Back: symmetric, no curvature. ROM normal. No CVA tenderness. Lungs: clear to auscultation bilaterally Heart: regular rate and rhythm, S1, S2 normal, no murmur, click, rub or gallop Abdomen: soft, non-tender; bowel sounds normal; no masses,  no organomegaly Pulses: 2+ and symmetric Skin: Skin color, texture, turgor normal. No rashes or lesions Lymph nodes: Cervical, supraclavicular, and axillary nodes normal. Neuro:  awake and interactive with normal mood and affect. Higher cortical functions are normal. Speech is clear without word-finding difficulty or dysarthria. Extraocular movements are intact. Visual fields of both eyes are grossly intact. Sensation to light touch is grossly intact bilaterally of upper and lower extremities. Motor examination shows 4+/5 symmetric hand grip and upper extremity and 5/5 lower extremity strength. There is no pronation or drift. Gait is non-ataxic   Lab Results  Component Value Date   HGBA1C 6.0 03/03/2022   HGBA1C 5.9 08/30/2021   HGBA1C 6.1 02/18/2021    Lab Results  Component Value Date   CREATININE 0.76 03/03/2022   CREATININE 0.68 08/30/2021   CREATININE 0.69 02/18/2021    Lab Results  Component Value Date   WBC 8.0 09/11/2016   HGB 13.6 09/11/2016   HCT 40.8 09/11/2016   PLT 257.0 09/11/2016   GLUCOSE 125 (H) 03/03/2022   CHOL 212 (H) 03/03/2022   TRIG 297.0 (H) 03/03/2022   HDL 40.10 03/03/2022   LDLDIRECT 119.0 03/03/2022   LDLCALC 88 08/17/2013   ALT 13 03/03/2022   AST 15 03/03/2022   NA 143 03/03/2022   K 4.4 03/03/2022   CL 105 03/03/2022   CREATININE 0.76 03/03/2022   BUN 18 03/03/2022   CO2 30 03/03/2022    TSH 3.80 12/12/2016   HGBA1C 6.0 03/03/2022   MICROALBUR 0.7 09/03/2021    DG MOBILE BONE DENSITY  Result Date: 08/06/2020 CLINICAL DATA:  Postmenopausal. History of L5-S1 posterior lumbar spine fusion. EXAM: DUAL X-RAY ABSORPTIOMETRY (DXA) FOR BONE MINERAL DENSITY TECHNIQUE: Bone mineral density measurements are performed of the spine, hip, and forearm, as appropriate, per International Society of Clinical Densitometry recommendations. The pertinent regions of interest are reported below. Non-contributory values are not reported. Images are obtained for bone mineral density measurement and are not obtained for diagnostic purposes. FINDINGS: AP LUMBAR SPINE L1 through L3 Bone Mineral Density (BMD):  1.009 g/cm2 Young Adult T-Score:  -  0.1 Z-Score:  1.7 LEFT FEMUR NECK Bone Mineral Density (BMD):  0.516 g/cm2 Young Adult T-Score: -3.0 Z-Score:  -1.4 LEFT FOREARM (1/3 RADIUS) Bone Mineral Density (BMD):  0.452 Young Adult T Score:  -4.0 Z Score:  -2.3 Unit: This study was performed at [facility] on the [place] (S/N [phone]), software version 13.4.2. Scan quality: The scan quality is good. Exclusions: L5 excluded due to surgical hardware. ASSESSMENT: Patient's diagnostic category is OSTEOPOROSIS by WHO Criteria. FRACTURE RISK: INCREASED FRAX: World Health Organization FRAX assessment of absolute fracture risk is not calculated for this patient because the patient has osteoporosis. COMPARISON: None. RECOMMENDATIONS 1. All patients should optimize calcium and vitamin D intake. 2. Consider FDA-approved medical therapies in postmenopausal women and men aged 64 years and older, based on the following: - A hip or vertebral (clinical or morphometric) fracture - T-score less than or equal to -2.5 at the femoral neck or spine after appropriate evaluation to exclude secondary causes - Low bone mass (T-score between -1.0 and -2.5 at the femoral neck or spine) and a 10-year probability of a hip fracture  greater than or equal to 3% or a 10-year probability of a major osteoporosis-related fracture greater than or equal to 20% based on the [place]-adapted WHO algorithm - Clinician judgment and/or patient preferences may indicate treatment for people with 10-year fracture probabilities above or below these levels 3. Patients with diagnosis of osteoporosis or at high risk for fracture should have regular bone mineral density tests. For patients eligible for Medicare, routine testing is allowed once every 2 years. The testing frequency can be increased to one year for patients who have rapidly progressing disease, those who are receiving or discontinuing medical therapy to restore bone mass, or have additional risk factors. Electronically Signed   By: Lajean Manes M.D.   On: 08/06/2020 14:31    Assessment & Plan:   Problem List Items Addressed This Visit   None   I spent a total of   minutes with this patient in a face to face visit on the date of this encounter reviewing the last office visit with me in       ,  most recent visit with cardiology ,    ,  patient's diet and exercise habits, home blood pressure /blod sugar readings, recent ER visit including labs and imaging studies ,   and post visit ordering of testing and therapeutics.    Follow-up: No follow-ups on file.   Crecencio Mc, MD

## 2022-03-24 NOTE — Assessment & Plan Note (Signed)
Currently well-controlled on current medications .  hemoglobin A1c is at goal and triglycerides are lower than previously  . Patient is reminded to schedule an annual eye exam and foot exam is normal today. Patient has no microalbuminuria. Patient is tolerating ACE/ARB for renal protection and hypertension .  She has deferred jardiance and CBG. .  Statin intolerant.   Lab Results  Component Value Date   HGBA1C 6.0 03/03/2022   Lab Results  Component Value Date   MICROALBUR 0.7 09/03/2021   MICROALBUR 1.5 04/02/2020

## 2022-03-24 NOTE — Assessment & Plan Note (Signed)
Improved diarrhea pattern  With food avoidance and use of Imodium. but still unpredictable enough to discourage her from exercising.  Colonoscopy was normal.  Trying  to keep a food diary

## 2022-03-25 ENCOUNTER — Ambulatory Visit: Payer: HMO

## 2022-03-25 NOTE — Assessment & Plan Note (Signed)
-   her symptoms are class 2. She will continue her current meds.

## 2022-03-25 NOTE — Assessment & Plan Note (Signed)
With CHB, s/p PPM .  She is asymptomatic

## 2022-03-25 NOTE — Assessment & Plan Note (Signed)
Continue use of detrol

## 2022-03-27 ENCOUNTER — Ambulatory Visit (INDEPENDENT_AMBULATORY_CARE_PROVIDER_SITE_OTHER): Payer: HMO

## 2022-03-27 DIAGNOSIS — I429 Cardiomyopathy, unspecified: Secondary | ICD-10-CM | POA: Diagnosis not present

## 2022-03-27 LAB — CUP PACEART REMOTE DEVICE CHECK
Battery Remaining Longevity: 2 mo
Battery Voltage: 2.77 V
Brady Statistic AP VP Percent: 1.1 %
Brady Statistic AP VS Percent: 0 %
Brady Statistic AS VP Percent: 98.9 %
Brady Statistic AS VS Percent: 0 %
Brady Statistic RA Percent Paced: 1.1 %
Brady Statistic RV Percent Paced: 99.99 %
Date Time Interrogation Session: 20231207135011
Implantable Lead Connection Status: 753985
Implantable Lead Connection Status: 753985
Implantable Lead Connection Status: 753985
Implantable Lead Implant Date: 20150212
Implantable Lead Implant Date: 20150212
Implantable Lead Implant Date: 20150212
Implantable Lead Location: 753858
Implantable Lead Location: 753859
Implantable Lead Location: 753860
Implantable Lead Model: 4194
Implantable Lead Model: 5076
Implantable Lead Model: 5076
Implantable Pulse Generator Implant Date: 20150212
Lead Channel Impedance Value: 323 Ohm
Lead Channel Impedance Value: 342 Ohm
Lead Channel Impedance Value: 361 Ohm
Lead Channel Impedance Value: 380 Ohm
Lead Channel Impedance Value: 418 Ohm
Lead Channel Impedance Value: 437 Ohm
Lead Channel Impedance Value: 437 Ohm
Lead Channel Impedance Value: 532 Ohm
Lead Channel Impedance Value: 532 Ohm
Lead Channel Pacing Threshold Amplitude: 0.75 V
Lead Channel Pacing Threshold Amplitude: 0.875 V
Lead Channel Pacing Threshold Amplitude: 1.875 V
Lead Channel Pacing Threshold Pulse Width: 0.4 ms
Lead Channel Pacing Threshold Pulse Width: 0.4 ms
Lead Channel Pacing Threshold Pulse Width: 0.4 ms
Lead Channel Sensing Intrinsic Amplitude: 1.125 mV
Lead Channel Sensing Intrinsic Amplitude: 1.125 mV
Lead Channel Sensing Intrinsic Amplitude: 17.5 mV
Lead Channel Sensing Intrinsic Amplitude: 17.5 mV
Lead Channel Setting Pacing Amplitude: 1.5 V
Lead Channel Setting Pacing Amplitude: 2 V
Lead Channel Setting Pacing Amplitude: 2.5 V
Lead Channel Setting Pacing Pulse Width: 0.4 ms
Lead Channel Setting Pacing Pulse Width: 0.8 ms
Lead Channel Setting Sensing Sensitivity: 0.9 mV
Zone Setting Status: 755011
Zone Setting Status: 755011

## 2022-03-28 ENCOUNTER — Telehealth: Payer: Self-pay

## 2022-04-07 ENCOUNTER — Ambulatory Visit (INDEPENDENT_AMBULATORY_CARE_PROVIDER_SITE_OTHER): Payer: HMO

## 2022-04-07 VITALS — BP 138/80 | HR 71 | Temp 98.1°F | Resp 15 | Ht 62.0 in | Wt 160.0 lb

## 2022-04-07 DIAGNOSIS — Z0000 Encounter for general adult medical examination without abnormal findings: Secondary | ICD-10-CM | POA: Diagnosis not present

## 2022-04-07 NOTE — Patient Instructions (Addendum)
[name] , Thank you for taking time to come for your Medicare Wellness Visit. I appreciate your ongoing commitment to your health goals. Please review the following plan we discussed and let me know if I can assist you in the future.   These are the goals we discussed:  Goals Addressed               This Visit's Progress     Patient Stated     Weight (lb) < 160 lb (72.6 kg) (pt-stated)   160 lb (72.6 kg)     I would like to lose a little weight Stay active Weight goal 150lb         This is a list of the screening recommended for you and due dates:  Health Maintenance  Topic Date Due   COVID-19 Vaccine (3 - Pfizer risk series) 04/09/2022*   Zoster (Shingles) Vaccine (1 of 2) 06/23/2022*   Hemoglobin A1C  09/01/2022   Yearly kidney health urinalysis for diabetes  09/04/2022   Mammogram  10/09/2022   Eye exam for diabetics  11/21/2022   Yearly kidney function blood test for diabetes  03/04/2023   Complete foot exam   03/25/2023   Medicare Annual Wellness Visit  04/08/2023   Colon Cancer Screening  12/01/2027   DTaP/Tdap/Td vaccine (3 - Td or Tdap) 03/24/2028   Pneumonia Vaccine  Completed   Flu Shot  Completed   DEXA scan (bone density measurement)  Completed   Hepatitis C Screening: USPSTF Recommendation to screen - Ages 71-79 yo.  Completed   HPV Vaccine  Aged Out  *Topic was postponed. The date shown is not the original due date.    Advanced directives: End of life planning; Advance aging; Advanced directives discussed.  Copy of current HCPOA/Living Will requested.    Conditions/risks identified: none new  Next appointment: Follow up in one year for your annual wellness visit    Preventive Care 65 Years and Older, Female Preventive care refers to lifestyle choices and visits with your health care provider that can promote health and wellness. What does preventive care include? A yearly physical exam. This is also called an annual well check. Dental exams  once or twice a year. Routine eye exams. Ask your health care provider how often you should have your eyes checked. Personal lifestyle choices, including: Daily care of your teeth and gums. Regular physical activity. Eating a healthy diet. Avoiding tobacco and drug use. Limiting alcohol use. Practicing safe sex. Taking low-dose aspirin every day. Taking vitamin and mineral supplements as recommended by your health care provider. What happens during an annual well check? The services and screenings done by your health care provider during your annual well check will depend on your age, overall health, lifestyle risk factors, and family history of disease. Counseling  Your health care provider may ask you questions about your: Alcohol use. Tobacco use. Drug use. Emotional well-being. Home and relationship well-being. Sexual activity. Eating habits. History of falls. Memory and ability to understand (cognition). Work and work Statistician. Reproductive health. Screening  You may have the following tests or measurements: Height, weight, and BMI. Blood pressure. Lipid and cholesterol levels. These may be checked every 5 years, or more frequently if you are over 17 years old. Skin check. Lung cancer screening. You may have this screening every year starting at age 52 if you have a 30-pack-year history of smoking and currently smoke or have quit within the past 15 years. Fecal occult blood  test (FOBT) of the stool. You may have this test every year starting at age 34. Flexible sigmoidoscopy or colonoscopy. You may have a sigmoidoscopy every 5 years or a colonoscopy every 10 years starting at age 47. Hepatitis C blood test. Hepatitis B blood test. Sexually transmitted disease (STD) testing. Diabetes screening. This is done by checking your blood sugar (glucose) after you have not eaten for a while (fasting). You may have this done every 1-3 years. Bone density scan. This is done to  screen for osteoporosis. You may have this done starting at age 62. Mammogram. This may be done every 1-2 years. Talk to your health care provider about how often you should have regular mammograms. Talk with your health care provider about your test results, treatment options, and if necessary, the need for more tests. Vaccines  Your health care provider may recommend certain vaccines, such as: Influenza vaccine. This is recommended every year. Tetanus, diphtheria, and acellular pertussis (Tdap, Td) vaccine. You may need a Td booster every 10 years. Zoster vaccine. You may need this after age 22. Pneumococcal 13-valent conjugate (PCV13) vaccine. One dose is recommended after age 57. Pneumococcal polysaccharide (PPSV23) vaccine. One dose is recommended after age 79. Talk to your health care provider about which screenings and vaccines you need and how often you need them. This information is not intended to replace advice given to you by your health care provider. Make sure you discuss any questions you have with your health care provider. Document Released: 05/04/2015 Document Revised: 12/26/2015 Document Reviewed: 02/06/2015 Elsevier Interactive Patient Education  2017 [place] Prevention in the Home Falls can cause injuries. They can happen to people of all ages. There are many things you can do to make your home safe and to help prevent falls. What can I do on the outside of my home? Regularly fix the edges of walkways and driveways and fix any cracks. Remove anything that might make you trip as you walk through a door, such as a raised step or threshold. Trim any bushes or trees on the path to your home. Use bright outdoor lighting. Clear any walking paths of anything that might make someone trip, such as rocks or tools. Regularly check to see if handrails are loose or broken. Make sure that both sides of any steps have handrails. Any raised decks and porches should have  guardrails on the edges. Have any leaves, snow, or ice cleared regularly. Use sand or salt on walking paths during winter. Clean up any spills in your garage right away. This includes oil or grease spills. What can I do in the bathroom? Use night lights. Install grab bars by the toilet and in the tub and shower. Do not use towel bars as grab bars. Use non-skid mats or decals in the tub or shower. If you need to sit down in the shower, use a plastic, non-slip stool. Keep the floor dry. Clean up any water that spills on the floor as soon as it happens. Remove soap buildup in the tub or shower regularly. Attach bath mats securely with double-sided non-slip rug tape. Do not have throw rugs and other things on the floor that can make you trip. What can I do in the bedroom? Use night lights. Make sure that you have a light by your bed that is easy to reach. Do not use any sheets or blankets that are too big for your bed. They should not hang down onto the floor. Have  a firm chair that has side arms. You can use this for support while you get dressed. Do not have throw rugs and other things on the floor that can make you trip. What can I do in the kitchen? Clean up any spills right away. Avoid walking on wet floors. Keep items that you use a lot in easy-to-reach places. If you need to reach something above you, use a strong step stool that has a grab bar. Keep electrical cords out of the way. Do not use floor polish or wax that makes floors slippery. If you must use wax, use non-skid floor wax. Do not have throw rugs and other things on the floor that can make you trip. What can I do with my stairs? Do not leave any items on the stairs. Make sure that there are handrails on both sides of the stairs and use them. Fix handrails that are broken or loose. Make sure that handrails are as long as the stairways. Check any carpeting to make sure that it is firmly attached to the stairs. Fix any carpet  that is loose or worn. Avoid having throw rugs at the top or bottom of the stairs. If you do have throw rugs, attach them to the floor with carpet tape. Make sure that you have a light switch at the top of the stairs and the bottom of the stairs. If you do not have them, ask someone to add them for you. What else can I do to help prevent falls? Wear shoes that: Do not have high heels. Have rubber bottoms. Are comfortable and fit you well. Are closed at the toe. Do not wear sandals. If you use a stepladder: Make sure that it is fully opened. Do not climb a closed stepladder. Make sure that both sides of the stepladder are locked into place. Ask someone to hold it for you, if possible. Clearly mark and make sure that you can see: Any grab bars or handrails. First and last steps. Where the edge of each step is. Use tools that help you move around (mobility aids) if they are needed. These include: Canes. Walkers. Scooters. Crutches. Turn on the lights when you go into a dark area. Replace any light bulbs as soon as they burn out. Set up your furniture so you have a clear path. Avoid moving your furniture around. If any of your floors are uneven, fix them. If there are any pets around you, be aware of where they are. Review your medicines with your doctor. Some medicines can make you feel dizzy. This can increase your chance of falling. Ask your doctor what other things that you can do to help prevent falls. This information is not intended to replace advice given to you by your health care provider. Make sure you discuss any questions you have with your health care provider. Document Released: 02/01/2009 Document Revised: 09/13/2015 Document Reviewed: 05/12/2014 Elsevier Interactive Patient Education  2017 [employer].

## 2022-04-07 NOTE — Progress Notes (Addendum)
Subjective:   Julia Pierce is a 66 y.o. female who presents for Medicare Annual (Subsequent) preventive examination.  Review of Systems    No ROS.  Medicare Wellness    Cardiac Risk Factors include: advanced age (>33mn, >>2women);diabetes mellitus     Objective:    Today's Vitals   04/07/22 1119  BP: 138/80  Pulse: 71  Resp: 15  Temp: 98.1 F (36.7 C)  SpO2: 97%  Weight: 160 lb (72.6 kg)  Height: _0  (1.575 m)   Body mass index is 29.26 kg/m.     04/07/2022   11:33 AM 03/21/2021    1:29 PM 03/20/2020    1:21 PM 02/06/2020   10:00 AM 03/09/2019   11:14 AM 12/16/2017    9:44 AM 12/15/2016    9:42 AM  Advanced Directives  Does Patient Have a Medical Advance Directive? Yes _1  No  Type of AParamedicof [place]ain[place]Living will        Does patient want to make changes to medical advance directive? No - Patient declined        Copy of HLyndonin Chart? No - copy requested        Would patient like information on creating a medical advance directive?  No - Patient declined No - Patient declined  No - Patient declined No - Patient declined No - Patient declined    Current Medications (verified) Outpatient Encounter Medications as of 04/07/2022  Medication Sig   Alpha-D-Galactosidase (BEANO PO) Take by mouth. Before meals   amLODipine (NORVASC) 2.5 MG tablet TAKE 1 TABLET BY MOUTH EVERY DAY   Azelaic Acid 15 % cream APPLY TO AFFECTED AREA EVERY DAY   B Complex Vitamins (B COMPLEX PO) Take 1 tablet by mouth daily.    blood glucose meter kit and supplies Use to check blood sugars once daily daily. (FOR ICD-10 E11.9).   buPROPion (WELLBUTRIN XL) 150 MG 24 hr tablet TAKE 1 TABLET BY MOUTH EVERY DAY   calcium elemental as carbonate (BARIATRIC TUMS ULTRA) 400 MG chewable tablet Chew 400 mg by mouth daily.   carvedilol (COREG) 25 MG tablet Take 1 tablet (25 mg total) by mouth 2 (two) times daily.   Cholecalciferol (VITAMIN  D3) 2000 UNITS TABS Take 1 tablet by mouth daily.   diphenoxylate-atropine (LOMOTIL) 2.5-0.025 MG tablet TAKE 1 TABLET BY MOUTH 4 (FOUR) TIMES DAILY AS NEEDED FOR DIARRHEA OR LOOSE STOOLS.   esomeprazole (NEXIUM) 10 MG packet Take 10 mg by mouth daily.   fluticasone (FLONASE) 50 MCG/ACT nasal spray Place 2 sprays into both nostrils daily.   glucose blood test strip Use to check blood sugars 1 time daily. Accu-chek test strips   losartan (COZAAR) 100 MG tablet Take 100 mg by mouth daily.   methocarbamol (ROBAXIN) 500 MG tablet TAKE 1 TABLET BY MOUTH 4 TIMES DAILY.   Multiple Vitamin (MULTIVITAMIN) tablet Take 1 tablet by mouth daily.   sertraline (ZOLOFT) 100 MG tablet TAKE 1 TABLET BY MOUTH EVERY DAY   tolterodine (DETROL) 2 MG tablet TAKE 1 TABLET BY MOUTH TWICE A DAY   Facility-Administered Encounter Medications as of 04/07/2022  Medication   diclofenac sodium (VOLTAREN) 1 % transdermal gel 4 g   midazolam (VERSED) 5 MG/5ML injection 5 mg   orphenadrine (NORFLEX) injection 60 mg   sodium chloride 0.9 % injection 20 mL   triamcinolone acetonide (KENALOG-40) injection 40 mg    Allergies (verified) Cephalexin, Penicillin  g, Penicillins, Atorvastatin, Cymbalta [duloxetine hcl], Duloxetine, Fenofibrate, Statins, Zofran [ondansetron], and Latex   History: Past Medical History:  Diagnosis Date   Anxiety    Arthritis    Cardiac arrhythmia due to congenital heart disease    Chicken pox    Chronic systolic heart failure ([place]) 09/13/2013   EF now 55%  Bu June 2019   ECHO   Depression    Diabetes ([place])    Erythema migrans (Lyme disease) 12/06/2012   Fibromyalgia    Headache, frequent episodic tension-type    High cholesterol    History of high blood pressure    readings   Holter monitor, abnormal 11/2010   done for long QT.  some contractile asynchrony Julia Pierce)   Hx of colonoscopy 12/2010   normal,  next due 2022, Julia Pierce   Hyperlipidemia    Hypertension    Morton's neuroma     bilateral   Pacemaker    3 wire w/o difibrulator   Syncope    Past Surgical History:  Procedure Laterality Date   ABDOMINAL HYSTERECTOMY  Dec 2013   [place]   APPENDECTOMY  1973   BI-VENTRICULAR PACEMAKER INSERTION (CRT-P)  05/2013   MDT CRTP implanted by Dr Lovena Le for cardiomyopathy, LBBB, and syncope   HYSTEROSCOPY  2005   heavy bleeding   IMPLANTABLE CARDIOVERTER DEFIBRILLATOR IMPLANT N/A 06/02/2013   Procedure: IMPLANTABLE CARDIOVERTER DEFIBRILLATOR IMPLANT;  Surgeon: Evans Lance, MD;  Location: [facility] CATH LAB;  Service: Cardiovascular;  Laterality: N/A;   SPINAL FUSION  03/2007   ruptured disc  L5  Julia Pierce   TONSILLECTOMY AND ADENOIDECTOMY  1966   Family History  Problem Relation Age of Onset   Arthritis Mother    Hyperlipidemia Mother    Cirrhosis Mother    Arthritis Father    Prostate cancer Father    Hyperlipidemia Father    Pulmonary embolism Father 58       blood clots   Alzheimer's disease Father    Diabetes Brother    Prostate cancer Brother    Seizures Son    Epilepsy Son    ADD / ADHD Son    Breast cancer Maternal Aunt    Diabetes Maternal Uncle    Other Maternal Grandmother        Cerebral hemorrhage   Heart disease Maternal Grandfather    Coronary artery disease Paternal Grandfather 5   Social History   Socioeconomic History   Marital status: Married    Spouse name: Not on file   Number of children: 2   Years of education: Not on file   Highest education level: Not on file  Occupational History   Occupation: retired  Tobacco Use   Smoking status: Never   Smokeless tobacco: Never  Vaping Use   Vaping Use: Never used  Substance and Sexual Activity   Alcohol use: No   Drug use: No   Sexual activity: Yes  Other Topics Concern   Not on file  Social History Narrative   Not on file   Social Determinants of Health   Financial Resource Strain: Low Risk  (04/07/2022)   Overall Financial Resource Strain (CARDIA)    Difficulty of Paying Living  Expenses: Not hard at all  Food Insecurity: No Food Insecurity (04/07/2022)   Hunger Vital Sign    Worried About Running Out of Food in the Last Year: Never true    Ran Out of Food in the Last Year: Never true  Transportation Needs: No Transportation  Needs (04/07/2022)   PRAPARE - Hydrologist (Medical): No    Lack of Transportation (Non-Medical): No  Physical Activity: Unknown (03/21/2021)   Exercise Vital Sign    Days of Exercise per Week: 0 days    Minutes of Exercise per Session: Not on file  Stress: No Stress Concern Present (04/07/2022)   [place]    Feeling of Stress : Only a little  Social Connections: Unknown (04/07/2022)   Social Connection and Isolation Panel [NHANES]    Frequency of Communication with Friends and Family: Not on file    Frequency of Social Gatherings with Friends and Family: Not on file    Attends Religious Services: Not on file    Active Member of Clubs or Organizations: Not on file    Attends Archivist Meetings: Not on file    Marital Status: Married    Tobacco Counseling Counseling given: Not Answered   Clinical Intake:  Pre-visit preparation completed: Yes        Diabetes: Yes (Followed by PCP)  Nutrition Risk Assessment: Does the patient have any non-healing wounds?  No  Has the patient had any unintentional weight loss or weight gain?  No   Diabetes: Is the patient diabetic?  Yes  If diabetic, was a CBG obtained today?  No Did the patient bring in their glucometer from home?  No  How often do you monitor your CBG's? 2-3x weekly.   Financial Strains and Diabetes Management: Are you having any financial strains with the device, your supplies or your medication? No .  Does the patient want to be seen by Chronic Care Management for management of their diabetes?  No  Would the patient like to be referred to a Nutritionist or for  Diabetic Management?  No    How often do you need to have someone help you when you read instructions, pamphlets, or other written materials from your doctor or pharmacy?: 1 - Never    Interpreter Needed?: No      Activities of Daily Living    04/07/2022   11:37 AM  In your present state of health, do you have any difficulty performing the following activities:  Hearing? 0  Vision? 0  Difficulty concentrating or making decisions? 0  Walking or climbing stairs? 0  Dressing or bathing? 0  Doing errands, shopping? 0  Preparing Food and eating ? N  Using the Toilet? N  In the past six months, have you accidently leaked urine? N  Do you have problems with loss of bowel control? N  Comment Followed by Gastroenterology and PCP. Wears daily pad.  Managing your Medications? N  Managing your Finances? N  Housekeeping or managing your Housekeeping? N    Patient Care Team: Crecencio Mc, MD as PCP - General (Internal Medicine)  Indicate any recent Medical Services you may have received from other than [facility] providers in the past year (date may be approximate).     Assessment:   This is a routine wellness examination for Milady.  Hearing/Vision screen Hearing Screening - Comments:: Patient is able to hear conversational tones without difficulty.  No issues reported.   Vision Screening - Comments:: [facility], Dr. George Ina Wears corrective lenses  Dietary issues and exercise activities discussed: Current Exercise Habits: Home exercise routine, Type of exercise: treadmill, Time (Minutes): 40, Intensity: Mild Low carb diet Good water intake   Goals Addressed  This Visit's Progress     Patient Stated     Weight (lb) < 160 lb (72.6 kg) (pt-stated)   160 lb (72.6 kg)     I would like to lose a little weight Stay active Weight goal 150lb       Depression Screen    04/07/2022   11:52 AM 03/24/2022    4:01 PM 03/24/2022    3:35  PM 09/03/2021   12:58 PM 02/20/2021   10:06 AM 11/15/2020   10:50 AM 08/15/2020   11:17 AM  PHQ 2/9 Scores  PHQ - 2 Score _0 0 _1 PHQ- 9 Score _2 0 _3 Fall Risk    04/07/2022   11:36 AM 03/24/2022    3:23 PM 09/03/2021   12:58 PM 02/20/2021   10:06 AM 11/15/2020   10:01 AM  Fall Risk   Falls in the past year? 0 0 0 1 1  Number falls in past yr: 0   0 0  Injury with Fall? 0   1 1  Risk for fall due to : No Fall Risks No Fall Risks No Fall Risks No Fall Risks History of fall(s)  Follow up Falls evaluation completed;Falls prevention discussed Falls evaluation completed Falls evaluation completed Falls evaluation completed Falls evaluation completed    FALL RISK PREVENTION PERTAINING TO THE HOME: Home free of loose throw rugs in walkways, pet beds, electrical cords, etc? Yes  Adequate lighting in your home to reduce risk of falls? Yes   ASSISTIVE DEVICES UTILIZED TO PREVENT FALLS: Life alert? No  Use of a cane, walker or w/c? No  Grab bars in the bathroom? No  Shower chair or bench in shower? No  Elevated toilet seat or a handicapped toilet? No   TIMED UP AND GO: Was the test performed? Yes .  Length of time to ambulate 10 feet: 10 sec.   Gait steady and fast without use of assistive device  Cognitive Function:    12/16/2017    9:47 AM 12/15/2016    9:57 AM  MMSE - Mini Mental State Exam  Orientation to time 5 5  Orientation to Place 5 5  Registration 3 3  Attention/ Calculation 5 5  Recall 3 3  Language- name 2 objects 2 2  Language- repeat 1 1  Language- follow 3 step command 3 3  Language- read & follow direction 1 1  Write a sentence 1 1  Copy design 1 1  Total score 30 30        04/07/2022   11:58 AM 03/09/2019   10:51 AM  6CIT Screen  What Year? 0 points 0 points  What month? 0 points 0 points  What time? 0 points 0 points  Count back from 20 0 points 0 points  Months in reverse 0 points 0 points  Repeat phrase 0 points 0 points  Total  Score 0 points 0 points    Immunizations Immunization History  Administered Date(s) Administered   Fluad Quad(high Dose 65+) 01/31/2020, 03/24/2022   Influenza Split 03/28/2014   Influenza,inj,Quad PF,6+ Mos 02/07/2013, 04/11/2015, 03/06/2016, 03/18/2017, 03/24/2018, 02/04/2019   PFIZER(Purple Top)SARS-COV-2 Vaccination 07/29/2019, 08/23/2019   Pneumococcal Conjugate-13 06/28/2014   Pneumococcal Polysaccharide-23 05/11/2013, 04/04/2020   Tdap 02/08/2008, 03/24/2018   Screening Tests Health Maintenance  Topic Date Due   COVID-19 Vaccine (3 - Pfizer risk series) 04/09/2022 (Originally 09/20/2019)   Zoster Vaccines- Shingrix (1 of 2) 06/23/2022 (Originally  07/24/1974)   HEMOGLOBIN A1C  09/01/2022   Diabetic kidney evaluation - Urine ACR  09/04/2022   MAMMOGRAM  10/09/2022   OPHTHALMOLOGY EXAM  11/21/2022   Diabetic kidney evaluation - eGFR measurement  03/04/2023   FOOT EXAM  03/25/2023   Medicare Annual Wellness (AWV)  04/08/2023   COLONOSCOPY (Pts 45-71yr Insurance coverage will need to be confirmed)  12/01/2027   DTaP/Tdap/Td (3 - Td or Tdap) 03/24/2028   Pneumonia Vaccine 67 Years old  Completed   INFLUENZA VACCINE  Completed   DEXA SCAN  Completed   Hepatitis C Screening  Completed   HPV VACCINES  Aged Out    Health Maintenance There are no preventive care reminders to display for this patient.  Mammogram- due 09/2022. Next OV 06/2022. Order MM at this time per patient.   Lung Cancer Screening: (Low Dose CT Chest recommended if Age 67-80years, 30 pack-year currently smoking OR have quit w/in 15years.) does not qualify.   Hepatitis C Screening: Completed 2018.  Vision Screening: Recommended annual ophthalmology exams for early detection of glaucoma and other disorders of the eye.  Dental Screening: Recommended annual dental exams for proper oral hygiene  Community Resource Referral / Chronic Care Management: CRR required this visit?  No   CCM required this visit?  No       Plan:     I have personally reviewed and noted the following in the patient's chart:   Medical and social history Use of alcohol, tobacco or illicit drugs  Current medications and supplements including opioid prescriptions. Patient is not currently taking opioid prescriptions. Functional ability and status Nutritional status Physical activity Advanced directives List of other physicians Hospitalizations, surgeries, and ER visits in previous 12 months Vitals Screenings to include cognitive, depression, and falls Referrals and appointments  In addition, I have reviewed and discussed with patient certain preventive protocols, quality metrics, and best practice recommendations. A written personalized care plan for preventive services as well as general preventive health recommendations were provided to patient.     DJefferson LPN   [id number]    I have reviewed the above information and agree with above.   TDeborra Medina MD

## 2022-04-18 NOTE — Progress Notes (Signed)
Remote pacemaker transmission.   

## 2022-04-27 ENCOUNTER — Ambulatory Visit: Payer: HMO

## 2022-04-28 ENCOUNTER — Telehealth: Payer: Self-pay

## 2022-04-28 LAB — CUP PACEART REMOTE DEVICE CHECK
Battery Remaining Longevity: 2 mo
Battery Voltage: 2.76 V
Brady Statistic AP VP Percent: 0.93 %
Brady Statistic AP VS Percent: 0 %
Brady Statistic AS VP Percent: 99.06 %
Brady Statistic AS VS Percent: 0 %
Brady Statistic RA Percent Paced: 0.93 %
Brady Statistic RV Percent Paced: 99.99 %
Date Time Interrogation Session: 20240107142817
Implantable Lead Connection Status: 753985
Implantable Lead Connection Status: 753985
Implantable Lead Connection Status: 753985
Implantable Lead Implant Date: 20150212
Implantable Lead Implant Date: 20150212
Implantable Lead Implant Date: 20150212
Implantable Lead Location: 753858
Implantable Lead Location: 753859
Implantable Lead Location: 753860
Implantable Lead Model: 4194
Implantable Lead Model: 5076
Implantable Lead Model: 5076
Implantable Pulse Generator Implant Date: 20150212
Lead Channel Impedance Value: 342 Ohm
Lead Channel Impedance Value: 342 Ohm
Lead Channel Impedance Value: 380 Ohm
Lead Channel Impedance Value: 380 Ohm
Lead Channel Impedance Value: 437 Ohm
Lead Channel Impedance Value: 437 Ohm
Lead Channel Impedance Value: 456 Ohm
Lead Channel Impedance Value: 532 Ohm
Lead Channel Impedance Value: 551 Ohm
Lead Channel Pacing Threshold Amplitude: 0.75 V
Lead Channel Pacing Threshold Amplitude: 0.75 V
Lead Channel Pacing Threshold Amplitude: 1.875 V
Lead Channel Pacing Threshold Pulse Width: 0.4 ms
Lead Channel Pacing Threshold Pulse Width: 0.4 ms
Lead Channel Pacing Threshold Pulse Width: 0.4 ms
Lead Channel Sensing Intrinsic Amplitude: 1.75 mV
Lead Channel Sensing Intrinsic Amplitude: 1.75 mV
Lead Channel Sensing Intrinsic Amplitude: 17.5 mV
Lead Channel Sensing Intrinsic Amplitude: 17.5 mV
Lead Channel Setting Pacing Amplitude: 1.5 V
Lead Channel Setting Pacing Amplitude: 2 V
Lead Channel Setting Pacing Amplitude: 2.5 V
Lead Channel Setting Pacing Pulse Width: 0.4 ms
Lead Channel Setting Pacing Pulse Width: 0.8 ms
Lead Channel Setting Sensing Sensitivity: 0.9 mV
Zone Setting Status: 755011
Zone Setting Status: 755011

## 2022-04-28 NOTE — Telephone Encounter (Signed)
Scheduled remote reviewed. Normal device function.   Device reached RRT 12/23 - route to triage Next remote to be determined [place]     Spoke with patient.  She will see Oda Kilts, PA-C on 05/15/22 at 1040 to discuss gen change.

## 2022-05-01 ENCOUNTER — Other Ambulatory Visit: Payer: Self-pay | Admitting: Internal Medicine

## 2022-05-13 NOTE — Progress Notes (Deleted)
Electrophysiology Office Note Date: 05/13/2022  ID:  [name], DOB [birth year], MRN [id number]  PCP: Julia Mc, MD Primary Cardiologist: None Electrophysiologist: None ***  CC: Pacemaker follow-up  Julia Pierce is a 67 y.o. female seen today for None for {VISITTYPE:28148}  Device History: Device information MDT CRT-P, implanted 06/02/2013  Past Medical History:  Diagnosis Date   Anxiety    Arthritis    Cardiac arrhythmia due to congenital heart disease    Chicken pox    Chronic systolic heart failure ([place]) 09/13/2013   EF now 55%  Bu June 2019   ECHO   Depression    Diabetes (Julia Pierce)    Erythema migrans (Lyme disease) 12/06/2012   Fibromyalgia    Headache, frequent episodic tension-type    High cholesterol    History of high blood pressure    readings   Holter monitor, abnormal 11/2010   done for long QT.  some contractile asynchrony Julia Pierce)   Hx of colonoscopy 12/2010   normal,  next due 2022, Julia Pierce   Hyperlipidemia    Hypertension    Morton's neuroma    bilateral   Pacemaker    3 wire w/o difibrulator   Syncope     Current Outpatient Medications  Medication Instructions   Alpha-D-Galactosidase (BEANO PO) Oral, Before meals   amLODipine (NORVASC) 2.5 MG tablet TAKE 1 TABLET BY MOUTH EVERY DAY   Azelaic Acid 15 % cream APPLY TO AFFECTED AREA EVERY DAY   B Complex Vitamins (B COMPLEX PO) 1 tablet, Daily   blood glucose meter kit and supplies Use to check blood sugars once daily daily. (FOR ICD-10 E11.9).   buPROPion (WELLBUTRIN XL) 150 MG 24 hr tablet TAKE 1 TABLET BY MOUTH EVERY DAY   calcium elemental as carbonate (BARIATRIC TUMS ULTRA) 400 mg, Oral, Daily   carvedilol (COREG) 25 mg, Oral, 2 times daily   Cholecalciferol (VITAMIN D3) 2000 UNITS TABS 1 tablet, Daily   diphenoxylate-atropine (LOMOTIL) 2.5-0.025 MG tablet 1 tablet, Oral, 4 times daily PRN   esomeprazole (NEXIUM) 10 mg, Oral, Daily   fluticasone (FLONASE) 50 MCG/ACT nasal spray 2  sprays, Daily   glucose blood test strip Use to check blood sugars 1 time daily. Accu-chek test strips   losartan (COZAAR) 100 mg, Oral, Daily   methocarbamol (ROBAXIN) 500 mg, Oral, 4 times daily   Multiple Vitamin (MULTIVITAMIN) tablet 1 tablet, Oral, Daily,     sertraline (ZOLOFT) 100 MG tablet TAKE 1 TABLET BY MOUTH EVERY DAY   tolterodine (DETROL) 2 MG tablet TAKE 1 TABLET BY MOUTH TWICE A DAY    Family History: Family History  Problem Relation Age of Onset   Arthritis Mother    Hyperlipidemia Mother    Cirrhosis Mother    Arthritis Father    Prostate cancer Father    Hyperlipidemia Father    Pulmonary embolism Father 23       blood clots   Alzheimer's disease Father    Diabetes Brother    Prostate cancer Brother    Seizures Son    Epilepsy Son    ADD / ADHD Son    Breast cancer Maternal Aunt    Diabetes Maternal Uncle    Other Maternal Grandmother        Cerebral hemorrhage   Heart disease Maternal Grandfather    Coronary artery disease Paternal Grandfather 65    Physical Exam: There were no vitals filed for this visit.   GEN- NAD. A&O x 3. Normal  affect HEENT: Normocephalic, atraumatic Lungs- CTAB, Normal effort.  Heart- {EPRHYTHM:28826} rate and rhythm. No M/G/R.  Extremities- {EDEMA LEVEL:28147::"No"} peripheral edema. no clubbing or cyanosis Skin- warm and dry, no rash or lesion, PPM pocket well healed.  PPM Interrogation-  reviewed in detail today,  See PACEART report.  [id number] is not ordered today"}  Other studies Reviewed: Additional studies/ records that were reviewed today include: Previous EP office notes, Previous remote checks, Most recent labwork.   {Select studies to display:26339}  Assessment and Plan:  1. {Blank single:19197::"SND","CHB","Second Degree AV block","Tachy-Brady syndrome","Sick sinus syndrome","Symptomatic bradycardia","Uncontrolled atrial arrhyhtmia s/p AV node ablation"} s/p {Blank single:19197::"Medtronic","[employer]","[employer]","Biotronik"} PPM  Normal PPM function See Pace Art report No changes today   Current medicines are reviewed at length with the patient today.    Labs/ tests ordered today include: *** No orders of the defined types were placed in this encounter.    Disposition:   Follow up with {EPPROVIDERS:28135} {EPFOLLOW UP:28173}   Jacalyn Lefevre, PA-C  05/13/2022 9:46 AM  [facility] [address] [place] [address] [phone] (office) [phone] (fax)

## 2022-05-15 ENCOUNTER — Ambulatory Visit: Payer: HMO | Admitting: Student

## 2022-05-15 DIAGNOSIS — I429 Cardiomyopathy, unspecified: Secondary | ICD-10-CM

## 2022-05-15 DIAGNOSIS — I428 Other cardiomyopathies: Secondary | ICD-10-CM

## 2022-05-15 DIAGNOSIS — I447 Left bundle-branch block, unspecified: Secondary | ICD-10-CM

## 2022-05-15 DIAGNOSIS — Z950 Presence of cardiac pacemaker: Secondary | ICD-10-CM

## 2022-05-21 NOTE — Progress Notes (Unsigned)
Electrophysiology Office Note Date: 05/28/2022  ID:  Julia Pierce, DOB 10-31-1955, MRN [id number]  PCP: Crecencio Mc, MD Primary Cardiologist: None Electrophysiologist: Cristopher Peru, MD   CC: Pacemaker follow-up  Julia Pierce is a 67 y.o. female seen today for Cristopher Peru, MD for routine electrophysiology followup.   Alert received for device at [facility] as of 04/12/2022.  Since last being seen in our clinic the patient reports doing very well.  she denies chest pain, palpitations, dyspnea, PND, orthopnea, nausea, vomiting, dizziness, syncope, edema, weight gain, or early satiety.   Device History:  MDT CRT-P, implanted 06/02/2013  Past Medical History:  Diagnosis Date   Anxiety    Arthritis    Cardiac arrhythmia due to congenital heart disease    Chicken pox    Chronic systolic heart failure ([place]) 09/13/2013   EF now 55%  Bu June 2019   ECHO   Depression    Diabetes ([place])    Erythema migrans (Lyme disease) 12/06/2012   Fibromyalgia    Headache, frequent episodic tension-type    High cholesterol    History of high blood pressure    readings   Holter monitor, abnormal 11/2010   done for long QT.  some contractile asynchrony [name])   Hx of colonoscopy 12/2010   normal,  next due 2022, [name]   Hyperlipidemia    Hypertension    Morton's neuroma    bilateral   Pacemaker    3 wire w/o difibrulator   Syncope     Current Outpatient Medications  Medication Instructions   Alpha-D-Galactosidase (BEANO PO) Oral, Before meals   amLODipine (NORVASC) 2.5 MG tablet TAKE 1 TABLET BY MOUTH EVERY DAY   Azelaic Acid 15 % cream APPLY TO AFFECTED AREA EVERY DAY   B Complex Vitamins (B COMPLEX PO) 1 tablet, Daily   blood glucose meter kit and supplies Use to check blood sugars once daily daily. (FOR ICD-10 E11.9).   buPROPion (WELLBUTRIN XL) 150 MG 24 hr tablet TAKE 1 TABLET BY MOUTH EVERY DAY   calcium elemental as carbonate (BARIATRIC TUMS ULTRA) 400 mg, Oral, Daily    carvedilol (COREG) 25 mg, Oral, 2 times daily   Cholecalciferol (VITAMIN D3) 2000 UNITS TABS 1 tablet, Daily   diphenoxylate-atropine (LOMOTIL) 2.5-0.025 MG tablet 1 tablet, Oral, 4 times daily PRN   esomeprazole (NEXIUM) 10 mg, Oral, Daily   fluticasone (FLONASE) 50 MCG/ACT nasal spray 2 sprays, Daily   glucose blood test strip Use to check blood sugars 1 time daily. Accu-chek test strips   losartan (COZAAR) 100 mg, Oral, Daily   methocarbamol (ROBAXIN) 500 mg, Oral, 4 times daily   Multiple Vitamin (MULTIVITAMIN) tablet 1 tablet, Oral, Daily,     sertraline (ZOLOFT) 100 MG tablet TAKE 1 TABLET BY MOUTH EVERY DAY   tolterodine (DETROL) 2 MG tablet TAKE 1 TABLET BY MOUTH TWICE A DAY    Family History: Family History  Problem Relation Age of Onset   Arthritis Mother    Hyperlipidemia Mother    Cirrhosis Mother    Arthritis Father    Prostate cancer Father    Hyperlipidemia Father    Pulmonary embolism Father 18       blood clots   Alzheimer's disease Father    Diabetes Brother    Prostate cancer Brother    Seizures Son    Epilepsy Son    ADD / ADHD Son    Breast cancer Maternal Aunt    Diabetes Maternal Uncle  Other Maternal Grandmother        Cerebral hemorrhage   Heart disease Maternal Grandfather    Coronary artery disease Paternal Grandfather 59    Physical Exam: Vitals:   05/28/22 1230  BP: (!) 140/90  Pulse: 75  SpO2: 97%  Weight: 158 lb 12.8 oz (72 kg)  Height: 5' 2.5" (1.588 m)     GEN- NAD. A&O x 3. Normal affect HEENT: Normocephalic, atraumatic Lungs- CTAB, Normal effort.  Heart- Regular rate and rhythm rate and rhythm. No M/G/R.  Extremities- No peripheral edema. no clubbing or cyanosis Skin- warm and dry, no rash or lesion, PPM pocket well healed.  PPM Interrogation-  reviewed in detail today,  See PACEART report.  EKG is not ordered today  Other studies Reviewed: Additional studies/ records that were reviewed today include: Previous EP office  notes, Previous remote checks, Most recent labwork.   Assessment and Plan:  1. CHB s/p Medtronic PPM  Normal PPM function for device at [facility] as  of 04/12/2022. Gen change scheduled.  See [name] Art report No changes today  2. HFrecEF Echo 09/2020 ([facility]) with normal EF Continue coreg and losartan  3. HTN Stable on current regimen   Current medicines are reviewed at length with the patient today.    Labs/ tests ordered today include:  No orders of the defined types were placed in this encounter.   Disposition:   Follow up with [name] as usual post procedure   Signed, [name], PA-C  05/28/2022 12:47 PM  Julia Pierce [phone] (office) [phone] (fax)

## 2022-05-28 ENCOUNTER — Encounter: Payer: Self-pay | Admitting: Student

## 2022-05-28 ENCOUNTER — Ambulatory Visit: Payer: PPO | Attending: Student | Admitting: Student

## 2022-05-28 ENCOUNTER — Ambulatory Visit (INDEPENDENT_AMBULATORY_CARE_PROVIDER_SITE_OTHER): Payer: HMO

## 2022-05-28 VITALS — BP 140/90 | HR 75 | Ht 62.5 in | Wt 158.8 lb

## 2022-05-28 DIAGNOSIS — I442 Atrioventricular block, complete: Secondary | ICD-10-CM

## 2022-05-28 DIAGNOSIS — Z950 Presence of cardiac pacemaker: Secondary | ICD-10-CM

## 2022-05-28 DIAGNOSIS — I428 Other cardiomyopathies: Secondary | ICD-10-CM | POA: Diagnosis not present

## 2022-05-28 DIAGNOSIS — I447 Left bundle-branch block, unspecified: Secondary | ICD-10-CM | POA: Diagnosis not present

## 2022-05-28 DIAGNOSIS — I429 Cardiomyopathy, unspecified: Secondary | ICD-10-CM | POA: Diagnosis not present

## 2022-05-28 LAB — CUP PACEART REMOTE DEVICE CHECK
Battery Remaining Longevity: 2 mo
Battery Voltage: 2.74 V
Brady Statistic AP VP Percent: 1.77 %
Brady Statistic AP VS Percent: 0 %
Brady Statistic AS VP Percent: 98.23 %
Brady Statistic AS VS Percent: 0 %
Brady Statistic RA Percent Paced: 1.77 %
Brady Statistic RV Percent Paced: 99.99 %
Date Time Interrogation Session: 20240207145641
Implantable Lead Connection Status: 753985
Implantable Lead Connection Status: 753985
Implantable Lead Connection Status: 753985
Implantable Lead Implant Date: 20150212
Implantable Lead Implant Date: 20150212
Implantable Lead Implant Date: 20150212
Implantable Lead Location: 753858
Implantable Lead Location: 753859
Implantable Lead Location: 753860
Implantable Lead Model: 4194
Implantable Lead Model: 5076
Implantable Lead Model: 5076
Implantable Pulse Generator Implant Date: 20150212
Lead Channel Impedance Value: 323 Ohm
Lead Channel Impedance Value: 361 Ohm
Lead Channel Impedance Value: 380 Ohm
Lead Channel Impedance Value: 399 Ohm
Lead Channel Impedance Value: 437 Ohm
Lead Channel Impedance Value: 437 Ohm
Lead Channel Impedance Value: 475 Ohm
Lead Channel Impedance Value: 532 Ohm
Lead Channel Impedance Value: 532 Ohm
Lead Channel Pacing Threshold Amplitude: 0.75 V
Lead Channel Pacing Threshold Amplitude: 0.75 V
Lead Channel Pacing Threshold Amplitude: 1.875 V
Lead Channel Pacing Threshold Pulse Width: 0.4 ms
Lead Channel Pacing Threshold Pulse Width: 0.4 ms
Lead Channel Pacing Threshold Pulse Width: 0.4 ms
Lead Channel Sensing Intrinsic Amplitude: 1.75 mV
Lead Channel Sensing Intrinsic Amplitude: 1.75 mV
Lead Channel Sensing Intrinsic Amplitude: 17.5 mV
Lead Channel Sensing Intrinsic Amplitude: 17.5 mV
Lead Channel Setting Pacing Amplitude: 1.5 V
Lead Channel Setting Pacing Amplitude: 2 V
Lead Channel Setting Pacing Amplitude: 2.5 V
Lead Channel Setting Pacing Pulse Width: 0.4 ms
Lead Channel Setting Pacing Pulse Width: 0.8 ms
Lead Channel Setting Sensing Sensitivity: 0.9 mV
Zone Setting Status: 755011
Zone Setting Status: 755011

## 2022-05-28 LAB — CUP PACEART INCLINIC DEVICE CHECK
Battery Remaining Longevity: 2 mo
Battery Voltage: 2.74 V
Brady Statistic AP VP Percent: 0.79 %
Brady Statistic AP VS Percent: 0 %
Brady Statistic AS VP Percent: 99.2 %
Brady Statistic AS VS Percent: 0 %
Brady Statistic RA Percent Paced: 0.79 %
Brady Statistic RV Percent Paced: 99.99 %
Date Time Interrogation Session: 20240207124837
Implantable Lead Connection Status: 753985
Implantable Lead Connection Status: 753985
Implantable Lead Connection Status: 753985
Implantable Lead Implant Date: 20150212
Implantable Lead Implant Date: 20150212
Implantable Lead Implant Date: 20150212
Implantable Lead Location: 753858
Implantable Lead Location: 753859
Implantable Lead Location: 753860
Implantable Lead Model: 4194
Implantable Lead Model: 5076
Implantable Lead Model: 5076
Implantable Pulse Generator Implant Date: 20150212
Lead Channel Impedance Value: 361 Ohm
Lead Channel Impedance Value: 361 Ohm
Lead Channel Impedance Value: 361 Ohm
Lead Channel Impedance Value: 380 Ohm
Lead Channel Impedance Value: 456 Ohm
Lead Channel Impedance Value: 475 Ohm
Lead Channel Impedance Value: 475 Ohm
Lead Channel Impedance Value: 570 Ohm
Lead Channel Impedance Value: 608 Ohm
Lead Channel Pacing Threshold Amplitude: 0.75 V
Lead Channel Pacing Threshold Amplitude: 0.75 V
Lead Channel Pacing Threshold Amplitude: 1.875 V
Lead Channel Pacing Threshold Pulse Width: 0.4 ms
Lead Channel Pacing Threshold Pulse Width: 0.4 ms
Lead Channel Pacing Threshold Pulse Width: 0.4 ms
Lead Channel Sensing Intrinsic Amplitude: 1.75 mV
Lead Channel Sensing Intrinsic Amplitude: 1.875 mV
Lead Channel Sensing Intrinsic Amplitude: 17.5 mV
Lead Channel Sensing Intrinsic Amplitude: 17.5 mV
Lead Channel Setting Pacing Amplitude: 1.5 V
Lead Channel Setting Pacing Amplitude: 2 V
Lead Channel Setting Pacing Amplitude: 2.5 V
Lead Channel Setting Pacing Pulse Width: 0.4 ms
Lead Channel Setting Pacing Pulse Width: 0.8 ms
Lead Channel Setting Sensing Sensitivity: 0.9 mV
Zone Setting Status: 755011
Zone Setting Status: 755011

## 2022-05-28 NOTE — Patient Instructions (Signed)
Medication Instructions:  Your physician recommends that you continue on your current medications as directed. Please refer to the Current Medication list given to you today.  *If you need a refill on your cardiac medications before your next appointment, please call your pharmacy*   Lab Work: TODAY: BMET, CBC  If you have labs (blood work) drawn today and your tests are completely normal, you will receive your results only by: Adeline (if you have MyChart) OR A paper copy in the mail If you have any lab test that is abnormal or we need to change your treatment, we will call you to review the results.   Follow-Up: At [facility], you and your health needs are our priority.  As part of our continuing mission to provide you with exceptional heart care, we have created designated Provider Care Teams.  These Care Teams include your primary Cardiologist (physician) and Advanced Practice Providers (APPs -  Physician Assistants and Nurse Practitioners) who all work together to provide you with the care you need, when you need it.  Your next appointment:   We will contact you for post procedure f/u  Other Instructions See letter for Pacemaker Generator Change

## 2022-05-29 LAB — BASIC METABOLIC PANEL
BUN/Creatinine Ratio: 35 — ABNORMAL HIGH (ref 12–28)
BUN: 22 mg/dL (ref 8–27)
CO2: 25 mmol/L (ref 20–29)
Calcium: 9.5 mg/dL (ref 8.7–10.3)
Chloride: 104 mmol/L (ref 96–106)
Creatinine, Ser: 0.62 mg/dL (ref 0.57–1.00)
Glucose: 110 mg/dL — ABNORMAL HIGH (ref 70–99)
Potassium: 4.5 mmol/L (ref 3.5–5.2)
Sodium: 143 mmol/L (ref 134–144)
eGFR: 98 mL/min/{1.73_m2} (ref >59)

## 2022-05-29 LAB — CBC
Hematocrit: 38.7 % (ref 34.0–46.6)
Hemoglobin: 13.1 g/dL (ref 11.1–15.9)
MCH: 28.6 pg (ref 26.6–33.0)
MCHC: 33.9 g/dL (ref 31.5–35.7)
MCV: 85 fL (ref 79–97)
Platelets: 198 10*3/uL (ref 150–450)
RBC: 4.58 x10E6/uL (ref 3.77–5.28)
RDW: 13.2 % (ref 11.7–15.4)
WBC: 9 10*3/uL (ref 3.4–10.8)

## 2022-05-30 NOTE — Pre-Procedure Instructions (Signed)
Instructed patient on the following items: Arrival time 1:00 Nothing to eat or drink after midnight No meds AM of procedure Responsible person to drive you home and stay with you for 24 hrs Wash with special soap night before and morning of procedure

## 2022-06-02 ENCOUNTER — Encounter (HOSPITAL_COMMUNITY)
Admission: RE | Disposition: A | Discharge status: Home / Self Care | Payer: Self-pay | Source: Ambulatory Visit | Attending: Internal Medicine

## 2022-06-02 ENCOUNTER — Ambulatory Visit (HOSPITAL_COMMUNITY)
Admission: RE | Admit: 2022-06-02 | Discharge: 2022-06-02 | Disposition: A | Discharge status: Home / Self Care | Payer: PPO | Source: Ambulatory Visit | Attending: Internal Medicine | Admitting: Internal Medicine

## 2022-06-02 ENCOUNTER — Other Ambulatory Visit: Payer: Self-pay

## 2022-06-02 DIAGNOSIS — Z79899 Other long term (current) drug therapy: Secondary | ICD-10-CM | POA: Insufficient documentation

## 2022-06-02 DIAGNOSIS — Z45010 Encounter for checking and testing of cardiac pacemaker pulse generator [battery]: Secondary | ICD-10-CM | POA: Diagnosis not present

## 2022-06-02 DIAGNOSIS — I442 Atrioventricular block, complete: Secondary | ICD-10-CM | POA: Diagnosis not present

## 2022-06-02 DIAGNOSIS — I110 Hypertensive heart disease with heart failure: Secondary | ICD-10-CM | POA: Insufficient documentation

## 2022-06-02 DIAGNOSIS — I5022 Chronic systolic (congestive) heart failure: Secondary | ICD-10-CM

## 2022-06-02 HISTORY — PX: BIV PACEMAKER GENERATOR CHANGEOUT: EP1198

## 2022-06-02 LAB — GLUCOSE, CAPILLARY
Glucose-Capillary: 117 mg/dL — ABNORMAL HIGH (ref 70–99)
Glucose-Capillary: 118 mg/dL — ABNORMAL HIGH (ref 70–99)

## 2022-06-02 SURGERY — BIV PACEMAKER GENERATOR CHANGEOUT

## 2022-06-02 MED ORDER — LIDOCAINE HCL (PF) 1 % IJ SOLN
INTRAMUSCULAR | Status: DC | PRN
  Administered 2022-06-02: 30 mL

## 2022-06-02 MED ORDER — SODIUM CHLORIDE 0.9 % IV SOLN
80.0000 mg | INTRAVENOUS | Status: AC
  Administered 2022-06-02: 80 mg

## 2022-06-02 MED ORDER — CHLORHEXIDINE GLUCONATE 4 % EX LIQD: 4.0000 | Freq: Once | CUTANEOUS | Status: DC

## 2022-06-02 MED ORDER — MIDAZOLAM HCL 5 MG/5ML IJ SOLN
INTRAMUSCULAR | Status: DC | PRN
  Administered 2022-06-02: 1 mg via INTRAVENOUS

## 2022-06-02 MED ORDER — LIDOCAINE HCL (PF) 1 % IJ SOLN
INTRAMUSCULAR | Status: AC
  Filled 2022-06-02: qty 60

## 2022-06-02 MED ORDER — VANCOMYCIN HCL IN DEXTROSE 1-5 GM/200ML-% IV SOLN
1000.0000 mg | INTRAVENOUS | Status: AC
  Administered 2022-06-02: 1000 mg via INTRAVENOUS

## 2022-06-02 MED ORDER — POVIDONE-IODINE 10 % EX SWAB
2.0000 | Freq: Once | CUTANEOUS | Status: AC
  Administered 2022-06-02: 2 via TOPICAL

## 2022-06-02 MED ORDER — FENTANYL CITRATE (PF) 100 MCG/2ML IJ SOLN
INTRAMUSCULAR | Status: AC
  Filled 2022-06-02: qty 2

## 2022-06-02 MED ORDER — ACETAMINOPHEN 325 MG PO TABS: 325.0000 mg | ORAL_TABLET | ORAL | Status: DC | PRN

## 2022-06-02 MED ORDER — SODIUM CHLORIDE 0.9 % IV SOLN: INTRAVENOUS | Status: DC

## 2022-06-02 MED ORDER — MIDAZOLAM HCL 5 MG/5ML IJ SOLN
INTRAMUSCULAR | Status: AC
  Filled 2022-06-02: qty 5

## 2022-06-02 MED ORDER — FENTANYL CITRATE (PF) 100 MCG/2ML IJ SOLN
INTRAMUSCULAR | Status: DC | PRN
  Administered 2022-06-02: 12.5 ug via INTRAVENOUS

## 2022-06-02 MED ORDER — SODIUM CHLORIDE 0.9 % IV SOLN
INTRAVENOUS | Status: AC
  Filled 2022-06-02: qty 2

## 2022-06-02 MED ORDER — VANCOMYCIN HCL IN DEXTROSE 1-5 GM/200ML-% IV SOLN
INTRAVENOUS | Status: AC
  Filled 2022-06-02: qty 200

## 2022-06-02 SURGICAL SUPPLY — 6 items
CABLE SURGICAL S-101-97-12 (CABLE) ×1 IMPLANT
DEVICE CRTP PERCEPTA QUAD MRI (Pacemaker) IMPLANT
PAD DEFIB RADIO PHYSIO CONN (PAD) ×1 IMPLANT
POUCH AIGIS-R ANTIBACT PPM (Mesh General) ×1 IMPLANT
POUCH AIGIS-R ANTIBACT PPM MED (Mesh General) IMPLANT
TRAY PACEMAKER INSERTION (PACKS) ×1 IMPLANT

## 2022-06-02 NOTE — Discharge Instructions (Signed)

## 2022-06-02 NOTE — H&P (Signed)
Electrophysiology Office Note Date: 05/28/2022   ID:  Julia Pierce, DOB 09/05/1955, MRN [id number]   PCP: Crecencio Mc, MD Primary Cardiologist: None Electrophysiologist: Cristopher Peru, MD    CC: Pacemaker follow-up   Julia Pierce is a 67 y.o. female seen today for Cristopher Peru, MD for routine electrophysiology followup.    Alert received for device at [facility] as of 04/12/2022.   Since last being seen in our clinic the patient reports doing very well.  she denies chest pain, palpitations, dyspnea, PND, orthopnea, nausea, vomiting, dizziness, syncope, edema, weight gain, or early satiety.    Device History:   MDT CRT-P, implanted 06/02/2013       Past Medical History:  Diagnosis Date   Anxiety     Arthritis     Cardiac arrhythmia due to congenital heart disease     Chicken pox     Chronic systolic heart failure ([place]) 09/13/2013    EF now 55%  Bu June 2019   ECHO   Depression     Diabetes ([place])     Erythema migrans (Lyme disease) 12/06/2012   Fibromyalgia     Headache, frequent episodic tension-type     High cholesterol     History of high blood pressure      readings   Holter monitor, abnormal 11/2010    done for long QT.  some contractile asynchrony [name])   Hx of colonoscopy 12/2010    normal,  next due 2022, [name]   Hyperlipidemia     Hypertension     Morton's neuroma      bilateral   Pacemaker      3 wire w/o difibrulator   Syncope            Current Outpatient Medications  Medication Instructions   Alpha-D-Galactosidase (BEANO PO) Oral, Before meals   amLODipine (NORVASC) 2.5 MG tablet TAKE 1 TABLET BY MOUTH EVERY DAY   Azelaic Acid 15 % cream APPLY TO AFFECTED AREA EVERY DAY   B Complex Vitamins (B COMPLEX PO) 1 tablet, Daily   blood glucose meter kit and supplies Use to check blood sugars once daily daily. (FOR ICD-10 E11.9).   buPROPion (WELLBUTRIN XL) 150 MG 24 hr tablet TAKE 1 TABLET BY MOUTH EVERY DAY   calcium elemental as carbonate  (BARIATRIC TUMS ULTRA) 400 mg, Oral, Daily   carvedilol (COREG) 25 mg, Oral, 2 times daily   Cholecalciferol (VITAMIN D3) 2000 UNITS TABS 1 tablet, Daily   diphenoxylate-atropine (LOMOTIL) 2.5-0.025 MG tablet 1 tablet, Oral, 4 times daily PRN   esomeprazole (NEXIUM) 10 mg, Oral, Daily   fluticasone (FLONASE) 50 MCG/ACT nasal spray 2 sprays, Daily   glucose blood test strip Use to check blood sugars 1 time daily. Accu-chek test strips   losartan (COZAAR) 100 mg, Oral, Daily   methocarbamol (ROBAXIN) 500 mg, Oral, 4 times daily   Multiple Vitamin (MULTIVITAMIN) tablet 1 tablet, Oral, Daily,     sertraline (ZOLOFT) 100 MG tablet TAKE 1 TABLET BY MOUTH EVERY DAY   tolterodine (DETROL) 2 MG tablet TAKE 1 TABLET BY MOUTH TWICE A DAY      Family History:      Family History  Problem Relation Age of Onset   Arthritis Mother     Hyperlipidemia Mother     Cirrhosis Mother     Arthritis Father     Prostate cancer Father     Hyperlipidemia Father     Pulmonary embolism Father 43  blood clots   Alzheimer's disease Father     Diabetes Brother     Prostate cancer Brother     Seizures Son     Epilepsy Son     ADD / ADHD Son     Breast cancer Maternal Aunt     Diabetes Maternal Uncle     Other Maternal Grandmother          Cerebral hemorrhage   Heart disease Maternal Grandfather     Coronary artery disease Paternal Grandfather 24      Physical Exam:    Vitals:    05/28/22 1230  BP: (!) 140/90  Pulse: 75  SpO2: 97%  Weight: 158 lb 12.8 oz (72 kg)  Height: 5' 2.5" (1.588 m)      GEN- NAD. A&O x 3. Normal affect HEENT: Normocephalic, atraumatic Lungs- CTAB, Normal effort.  Heart- Regular rate and rhythm rate and rhythm. No M/G/R.  Extremities- No peripheral edema. no clubbing or cyanosis Skin- warm and dry, no rash or lesion, PPM pocket well healed.   PPM Interrogation-  reviewed in detail today,  See PACEART report.   EKG is not ordered today   Other studies  Reviewed: Additional studies/ records that were reviewed today include: Previous EP office notes, Previous remote checks, Most recent labwork.    Assessment and Plan:   1. CHB s/p Medtronic PPM  Normal PPM function for device at [facility] as  of 04/12/2022. Gen change scheduled.  See [name] Art report No changes today   2. 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 HTN Stable on current regimen    Current medicines are reviewed at length with the patient today.     Labs/ tests ordered today include:  No orders of the defined types were placed in this encounter.    Disposition:   Follow up with [name] as usual post procedure     Signed, [name], PA-C  05/28/2022 12:47 PM       EP Attending  Patient seen and examined. Agree with the findings as above. The patient presents for biv PM gen change as she has reached ERI. I have reviewed the indications/risks/benefits/goals/expectations of PM gen change and she wishes to proceed.   Carleene Overlie Tien Spooner,MD

## 2022-06-03 ENCOUNTER — Encounter (HOSPITAL_COMMUNITY): Payer: Self-pay | Admitting: Internal Medicine

## 2022-06-13 ENCOUNTER — Ambulatory Visit: Payer: PPO | Attending: Cardiology

## 2022-06-13 DIAGNOSIS — I447 Left bundle-branch block, unspecified: Secondary | ICD-10-CM

## 2022-06-13 DIAGNOSIS — Z950 Presence of cardiac pacemaker: Secondary | ICD-10-CM

## 2022-06-13 DIAGNOSIS — I442 Atrioventricular block, complete: Secondary | ICD-10-CM | POA: Diagnosis not present

## 2022-06-13 LAB — CUP PACEART INCLINIC DEVICE CHECK
Battery Remaining Longevity: 107 mo
Battery Voltage: 3.21 V
Brady Statistic AP VP Percent: 0.06 %
Brady Statistic AP VS Percent: 0.01 %
Brady Statistic AS VP Percent: 99.93 %
Brady Statistic AS VS Percent: 0 %
Brady Statistic RA Percent Paced: 0.06 %
Brady Statistic RV Percent Paced: 99.99 %
Date Time Interrogation Session: 20240223140348
Implantable Lead Connection Status: 753985
Implantable Lead Connection Status: 753985
Implantable Lead Connection Status: 753985
Implantable Lead Implant Date: 20150212
Implantable Lead Implant Date: 20150212
Implantable Lead Implant Date: 20150212
Implantable Lead Location: 753858
Implantable Lead Location: 753859
Implantable Lead Location: 753860
Implantable Lead Model: 4194
Implantable Lead Model: 5076
Implantable Lead Model: 5076
Implantable Pulse Generator Implant Date: 20240212
Lead Channel Impedance Value: 209 Ohm
Lead Channel Impedance Value: 323 Ohm
Lead Channel Impedance Value: 361 Ohm
Lead Channel Impedance Value: 380 Ohm
Lead Channel Impedance Value: 399 Ohm
Lead Channel Impedance Value: 418 Ohm
Lead Channel Impedance Value: 475 Ohm
Lead Channel Impedance Value: 551 Ohm
Lead Channel Impedance Value: 570 Ohm
Lead Channel Pacing Threshold Amplitude: 0.625 V
Lead Channel Pacing Threshold Amplitude: 0.875 V
Lead Channel Pacing Threshold Amplitude: 1.125 V
Lead Channel Pacing Threshold Pulse Width: 0.4 ms
Lead Channel Pacing Threshold Pulse Width: 0.4 ms
Lead Channel Pacing Threshold Pulse Width: 0.8 ms
Lead Channel Sensing Intrinsic Amplitude: 1.625 mV
Lead Channel Sensing Intrinsic Amplitude: 1.875 mV
Lead Channel Setting Pacing Amplitude: 1.5 V
Lead Channel Setting Pacing Amplitude: 2 V
Lead Channel Setting Pacing Amplitude: 2.5 V
Lead Channel Setting Pacing Pulse Width: 0.4 ms
Lead Channel Setting Pacing Pulse Width: 0.8 ms
Lead Channel Setting Sensing Sensitivity: 0.9 mV
Zone Setting Status: 755011

## 2022-06-13 NOTE — Patient Instructions (Signed)
After Your Pacemaker   Monitor your pacemaker site for redness, swelling, and drainage. Call the device clinic at [phone] if you experience these symptoms or fever/chills.  Your incision was closed with Steri-strips or staples:  You may shower 7 days after your procedure and wash your incision with soap and water. Avoid lotions, ointments, or perfumes over your incision until it is well-healed.  You may use a hot tub or a pool after your wound check appointment if the incision is completely closed.  There are no restrictions in arm movement after your wound check appointment.  You may drive, unless driving has been restricted by your healthcare providers.  Remote monitoring is used to monitor your pacemaker from home. This monitoring is scheduled every 91 days by our office. It allows [place] to keep an eye on the functioning of your device to ensure it is working properly. You will routinely see your Electrophysiologist annually (more often if necessary).

## 2022-06-13 NOTE — Progress Notes (Signed)
Wound check appointment. Steri-strips removed. Wound without redness.  Slight edema over pocket-no bruising.  Pt is not on any blood thinning agents. Incision edges approximated, wound well healed. Normal device function. Thresholds, sensing, and impedances consistent with implant measurements. Device programmed at chronic outputs. Histogram distribution appropriate for patient and level of activity. No mode switches or high ventricular rates noted. Patient educated about wound care. ROV in 3 months with implanting physician.

## 2022-06-24 NOTE — Progress Notes (Signed)
Remote pacemaker transmission.   

## 2022-06-25 ENCOUNTER — Ambulatory Visit: Payer: HMO | Admitting: Internal Medicine

## 2022-06-27 ENCOUNTER — Other Ambulatory Visit: Payer: Self-pay | Admitting: Internal Medicine

## 2022-06-27 ENCOUNTER — Ambulatory Visit (INDEPENDENT_AMBULATORY_CARE_PROVIDER_SITE_OTHER): Payer: PPO | Admitting: Internal Medicine

## 2022-06-27 ENCOUNTER — Encounter: Payer: Self-pay | Admitting: Internal Medicine

## 2022-06-27 ENCOUNTER — Telehealth: Payer: Self-pay | Admitting: Internal Medicine

## 2022-06-27 VITALS — BP 130/86 | HR 77 | Temp 98.0°F | Resp 15 | Ht 62.0 in | Wt 154.8 lb

## 2022-06-27 DIAGNOSIS — Z950 Presence of cardiac pacemaker: Secondary | ICD-10-CM

## 2022-06-27 DIAGNOSIS — E1169 Type 2 diabetes mellitus with other specified complication: Secondary | ICD-10-CM | POA: Diagnosis not present

## 2022-06-27 DIAGNOSIS — F409 Phobic anxiety disorder, unspecified: Secondary | ICD-10-CM

## 2022-06-27 DIAGNOSIS — I700 Atherosclerosis of aorta: Secondary | ICD-10-CM

## 2022-06-27 DIAGNOSIS — E781 Pure hyperglyceridemia: Secondary | ICD-10-CM | POA: Diagnosis not present

## 2022-06-27 DIAGNOSIS — I10 Essential (primary) hypertension: Secondary | ICD-10-CM

## 2022-06-27 DIAGNOSIS — I428 Other cardiomyopathies: Secondary | ICD-10-CM

## 2022-06-27 DIAGNOSIS — M109 Gout, unspecified: Secondary | ICD-10-CM

## 2022-06-27 DIAGNOSIS — I4581 Long QT syndrome: Secondary | ICD-10-CM | POA: Diagnosis not present

## 2022-06-27 DIAGNOSIS — H10022 Other mucopurulent conjunctivitis, left eye: Secondary | ICD-10-CM

## 2022-06-27 DIAGNOSIS — F320 Major depressive disorder, single episode, mild: Secondary | ICD-10-CM | POA: Diagnosis not present

## 2022-06-27 DIAGNOSIS — F5105 Insomnia due to other mental disorder: Secondary | ICD-10-CM | POA: Diagnosis not present

## 2022-06-27 DIAGNOSIS — H109 Unspecified conjunctivitis: Secondary | ICD-10-CM | POA: Insufficient documentation

## 2022-06-27 DIAGNOSIS — H9193 Unspecified hearing loss, bilateral: Secondary | ICD-10-CM | POA: Diagnosis not present

## 2022-06-27 MED ORDER — POLYMYXIN B-TRIMETHOPRIM 10000-0.1 UNIT/ML-% OP SOLN: 1.0000 [drp] | Freq: Four times a day (QID) | OPHTHALMIC | 0 refills | Status: DC

## 2022-06-27 MED ORDER — GLUCOSE BLOOD VI STRP: ORAL_STRIP | 12 refills | Status: DC

## 2022-06-27 MED ORDER — BUPROPION HCL ER (XL) 150 MG PO TB24: 150.0000 mg | ORAL_TABLET | Freq: Every day | ORAL | 1 refills | Status: DC

## 2022-06-27 NOTE — Assessment & Plan Note (Signed)
With CHB.  Biventricular   PM replaced Jun 05 2022

## 2022-06-27 NOTE — Patient Instructions (Addendum)
Increase dose of amlodipine as  a trial  to 5 mg daily ,  and follow BP readings after one week on new regimen.  Our goal is for most readings to be between  120/70 to   130/80   Continue losartan and carvedilol    You might want to try using Relaxium for insomnia  (as seen on TV commercials) . It is available through [employer] and contains all natural supplements:  Melatonin 5 mg  Chamomile 25 mg Passionflower extract 75 mg GABA 100 mg Ashwaganda extract 125 mg Magnesium citrate, glycinate, oxide (100 mg)  L tryptophan 500 mg Valerest (proprietary  ingredient ; probably valeria root extract)    Hearing test to be referred for at [facility]

## 2022-06-27 NOTE — Assessment & Plan Note (Signed)
Replaced Feb 15

## 2022-06-27 NOTE — Assessment & Plan Note (Addendum)
Symptoms of pain and drainage apparently started 40 minutes after her visit today ; several family members are having similar symptoms.  She is adamant that her symptoms are not viral or allergic,  and requesting an antibacterial ointment. I am prescribing an inexpensive non toxic eye drop

## 2022-06-27 NOTE — Progress Notes (Signed)
Subjective:  Patient ID: Julia Pierce, female    DOB: 02-Aug-1955  Age: 67 y.o. MRN: [id number]  CC: The primary encounter diagnosis was Primary hypertension. 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   HPI Julia Pierce presents for follow up on mulitple issues  Chief Complaint  Patient presents with   Medical Management of Chronic Issues   Diabetes   Hypertension   1) Situational anxiety : several family members (sister in law,  brother )  battling cancer. She feels fatigued.   Not sleeping well due to concurrent back pain   2) change in hearing:  she has noted decreased hearing in supine position   3) had pacemaker replaced Feb 12 . Left chest wall looks fine .  Had a mild viral illness prior to the procedure   4) T2DM:  She  feels generally well,  But is not  exercising regularly   has not been Checking  blood sugars  lately due to being out of teststrips and wiaiting for insurance  to approve coverage  of Accucheck strip.  She is taking   medications as directed. Following a carbohydrate modified diet 6 days per week. Denies numbness, burning and tingling of extremities. Appetite is good.    5) HTN:   elevated due to salt  followed by ibuprofen use per patient.  Sytolics have been > 130 for the past several weeks,  diastolics > 80  6) had gout recently triggered by soup in early February   7) recent exposure to "pinkeye" in grandchild,; several adult family members also affected but she has avoided physical cantact with the cilren   Outpatient Medications Prior to  Visit  Medication Sig Dispense Refill   acetaminophen (TYLENOL) 500 MG tablet Take 500-1,000 mg by mouth every 6 (six) hours as needed (pain).     Alpha-D-Galactosidase (BEANO PO) Take 1 tablet by mouth 3 (three) times daily before meals.     amLODipine (NORVASC) 2.5 MG tablet TAKE 1 TABLET BY MOUTH EVERY DAY 90 tablet 3   Azelaic Acid 15 % cream Apply 1 Application topically in the morning and at bedtime.     B Complex Vitamins (B COMPLEX PO) Take 1 tablet by mouth in the morning.     blood glucose meter kit and supplies Use to check blood sugars once daily daily. (FOR ICD-10 E11.9). 1 each 0   buPROPion (WELLBUTRIN XL) 150 MG 24 hr tablet TAKE 1 TABLET BY MOUTH EVERY DAY 90 tablet 1   calcium elemental as carbonate (BARIATRIC TUMS ULTRA) 400 MG chewable tablet Chew 400 mg by mouth in the morning.     carvedilol (COREG) 25 MG tablet Take 1 tablet (25 mg total) by mouth 2 (two) times daily. 180 tablet 3   Cholecalciferol (VITAMIN D3) 2000 UNITS TABS Take 2,000 Units by mouth in the morning.     diphenoxylate-atropine (LOMOTIL) 2.5-0.025 MG tablet TAKE 1 TABLET BY MOUTH 4 (FOUR) TIMES DAILY AS NEEDED FOR DIARRHEA OR LOOSE STOOLS. 30 tablet 1   Esomeprazole Magnesium 20 MG TBEC Take 20 mg by mouth daily before breakfast.     fluticasone (FLONASE) 50 MCG/ACT nasal spray Place 2 sprays into  both nostrils 2 (two) times daily as needed for allergies.     losartan (COZAAR) 100 MG tablet Take 100 mg by mouth every evening.     methocarbamol (ROBAXIN) 500 MG tablet TAKE 1 TABLET BY MOUTH 4 TIMES DAILY. (Patient taking differently: Take 500 mg by mouth 4 (four) times daily as needed for muscle spasms.) 90 tablet 2   Multiple Vitamin (MULTIVITAMIN) tablet Take 1 tablet by mouth in the morning.     Polyethyl Glycol-Propyl Glycol (SYSTANE ULTRA) 0.4-0.3 % SOLN Place 1-2 drops into both eyes 3 (three) times daily as needed (dry/irritated eyes.).     sertraline (ZOLOFT) 100 MG tablet TAKE 1 TABLET BY MOUTH EVERY  DAY 90 tablet 1   tolterodine (DETROL) 2 MG tablet TAKE 1 TABLET BY MOUTH TWICE A DAY 180 tablet 3   glucose blood test strip Use to check blood sugars 1 time daily. Accu-chek test strips 100 each 12   Facility-Administered Medications Prior to Visit  Medication Dose Route Frequency Provider Last Rate Last Admin   diclofenac sodium (VOLTAREN) 1 % transdermal gel 4 g  4 g Topical QID Ewing Schlein, MD       midazolam (VERSED) 5 MG/5ML injection 5 mg  5 mg Intravenous Once Ewing Schlein, MD       orphenadrine (NORFLEX) injection 60 mg  60 mg Intramuscular Once Ewing Schlein, MD       sodium chloride 0.9 % injection 20 mL  20 mL Other Once Ewing Schlein, MD       triamcinolone acetonide (KENALOG-40) injection 40 mg  40 mg Other Once Ewing Schlein, MD        Review of Systems;  Patient denies headache, fevers, malaise, unintentional weight loss, skin rash, eye pain (until 40 minutes after visit ) , sinus congestion and sinus pain, sore throat, dysphagia,  hemoptysis , cough, dyspnea, wheezing, chest pain, palpitations, orthopnea, edema, abdominal pain, nausea, melena, diarrhea, constipation, flank pain, dysuria, hematuria, urinary  Frequency, nocturia, numbness, tingling, seizures,  Focal weakness, Loss of consciousness,  Tremor, insomnia, depression,and suicidal ideation.      Objective:  BP 130/86   Pulse 77   Temp 98 F (36.7 C) (Oral)   Resp 15   Ht 5\' 2"  (1.575 m)   Wt 154 lb 12.8 oz (70.2 kg)   SpO2 99%   BMI 28.31 kg/m   BP Readings from Last 3 Encounters:  06/27/22 130/86  06/02/22 (!) 141/81  05/28/22 (!) 140/90    Wt Readings from Last 3 Encounters:  06/27/22 154 lb 12.8 oz (70.2 kg)  06/02/22 156 lb (70.8 kg)  05/28/22 158 lb 12.8 oz (72 kg)    Physical Exam  Lab Results  Component Value Date   HGBA1C 6.0 03/03/2022   HGBA1C 5.9 08/30/2021   HGBA1C 6.1 02/18/2021    Lab Results  Component Value Date   CREATININE 0.62 05/28/2022   CREATININE 0.76  03/03/2022   CREATININE 0.68 08/30/2021    Lab Results  Component Value Date   WBC 9.0 05/28/2022   HGB 13.1 05/28/2022   HCT 38.7 05/28/2022   PLT 198 05/28/2022   GLUCOSE 110 (H) 05/28/2022   CHOL 212 (H) 03/03/2022   TRIG 297.0 (H) 03/03/2022   HDL 40.10 03/03/2022   LDLDIRECT 119.0 03/03/2022   LDLCALC 88 08/17/2013   ALT 13 03/03/2022   AST 15 03/03/2022   NA 143 05/28/2022   K 4.5 05/28/2022   CL 104 05/28/2022   CREATININE 0.62 05/28/2022  BUN 22 05/28/2022   CO2 25 05/28/2022   TSH 3.80 12/12/2016   HGBA1C 6.0 03/03/2022   MICROALBUR 0.7 09/03/2021    EP PPM/ICD IMPLANT  Result Date: 06/02/2022 Conclusion: Successful removal of a previously implanted biventricular pacemaker, and insertion of a new biventricular pacemaker in a patient with complete heart block and chronic systolic heart failure. Sharlot Gowda Taylor,MD    Assessment & Plan:  .Primary hypertension Assessment & Plan: Not at goal  on current regimen of amlodipine 2.5 mg ,  Metoprolol 25 mg bid,  And losartan 100 mg daily .increase amlodipine to 5 mg daily  Renal function stable, no changes today.  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  hemoglobin A1c is at goal and triglycerides are lower than previously  . Patient is reminded to schedule an annual eye exam and foot exam is up to date . Patient has no microalbuminuria. Patient is tolerating ACE/ARB for renal protection and hypertension .  She has deferred jardiance and CBG. Monitor.  She is  .  Statin intolerant.    Lab Results  Component Value Date   HGBA1C 6.0 03/03/2022    Lab Results  Component Value Date   MICROALBUR 0.7 09/03/2021   MICROALBUR 1.5 04/02/2020      Orders: -     Glucose Blood; Use to check blood sugars 1 time daily. Accu-chek test strips  Dispense: 100 each; Refill: 12 -     Comprehensive metabolic panel; Future -     Hemoglobin A1c; Future -     Microalbumin / creatinine urine ratio  Hypertriglyceridemia Assessment & Plan: Improved with dietary avoidance  Of chocolate.  Fenofibrates and statin trials have  caused myalgia   Orders: -     LDL cholesterol, direct; Future -     Lipid panel; Future  Biventricular cardiac pacemaker in situ Assessment & Plan: Replaced Feb 15     Long QT syndrome Assessment & Plan: With CHB.  Biventricular   PM replaced Jun 05 2022   Bilateral hearing loss, unspecified hearing loss type Assessment & Plan: She does NOT have cerumen impaction.  Referral to [facility] for audiology testing   Orders: -     Ambulatory referral to ENT  Other mucopurulent conjunctivitis of left eye Assessment & Plan: Symptoms of pain and drainage apparently started 40 minutes after her visit today ; several family members  have been exposed to sick grandchild and are having similar symptoms.  She is adamant that her symptoms are not viral or allergic,  and requesting an antibacterial ointment. I am prescribing an inexpensive antibacterial  non toxic eye drop    Non-ischemic cardiomyopathy [facility]) Assessment & Plan:  - her symptoms are class 2. She will continue her current meds.   Gout of big toe Assessment & Plan: She has intolerance to allopurinol and uloric.  She reports that she had a mild  flare last week after eating a particular soup  Lab Results  Component Value Date   LABURIC 7.5 (H) 12/12/2016     Depression, major, single  episode, mild (HCC) Assessment & Plan: Managed with  wellbutrin to address concentration and fatigue, and  sertraline for the anxiety    Abdominal aortic  atherosclerosis (HCC) Assessment & Plan: She has  evidence of moderate  atherosclerosis in the aorta noted on CT of abdomen and  pelvis and the prognostic implications of this finding. She is statin intolerant due to myalgias    Insomnia due to anxiety and fear Assessment & Plan: Aggravated by family's multiple cancer diagnoses.  Trial of relaxium advised    Other orders -     buPROPion HCl ER (XL); Take 1 tablet (150 mg total) by mouth daily.  Dispense: 90 tablet; Refill: 1 -     Polymyxin B-Trimethoprim; Place 1 drop into both eyes every 6 (six) hours.  Dispense: 10 mL; Refill: 0     I provided 30 minutes of face-to-face time during this encounter reviewing patient's last visit with me, patient's  most recent visit with cardiology,  nephrology,  and neurology,  recent surgical and non surgical procedures, previous  labs and imaging studies, counseling on currently addressed issues,  and post visit ordering to diagnostics and therapeutics .   Follow-up: Return in about 4 months (around 10/27/2022).   Sherlene Shams, MD

## 2022-06-27 NOTE — Telephone Encounter (Signed)
Pt called stating she forgot to mention on her visit today that she had the pink eye and would like medication for it called in

## 2022-06-28 DIAGNOSIS — F409 Phobic anxiety disorder, unspecified: Secondary | ICD-10-CM | POA: Insufficient documentation

## 2022-06-28 DIAGNOSIS — F5105 Insomnia due to other mental disorder: Secondary | ICD-10-CM | POA: Insufficient documentation

## 2022-06-28 DIAGNOSIS — H9193 Unspecified hearing loss, bilateral: Secondary | ICD-10-CM | POA: Insufficient documentation

## 2022-06-28 NOTE — Assessment & Plan Note (Signed)
-   her symptoms are class 2. She will continue her current meds. 

## 2022-06-28 NOTE — Assessment & Plan Note (Signed)
She has maintained control on current medications .  hemoglobin A1c is at goal and triglycerides are lower than previously  . Patient is reminded to schedule an annual eye exam and foot exam is up to date . Patient has no microalbuminuria. Patient is tolerating ACE/ARB for renal protection and hypertension .  She has deferred jardiance and CBG. Monitor.  She is  .  Statin intolerant.    Lab Results  Component Value Date   HGBA1C 6.0 03/03/2022   Lab Results  Component Value Date   MICROALBUR 0.7 09/03/2021   MICROALBUR 1.5 04/02/2020

## 2022-06-28 NOTE — Assessment & Plan Note (Signed)
Improved with dietary avoidance  Of chocolate.  Fenofibrates and statin trials have  caused myalgia  ?

## 2022-06-28 NOTE — Assessment & Plan Note (Signed)
She does NOT have cerumen impaction.  Referral to [facility] for audiology testing

## 2022-06-28 NOTE — Assessment & Plan Note (Signed)
Aggravated by family's multiple cancer diagnoses.  Trial of relaxium advised

## 2022-06-28 NOTE — Assessment & Plan Note (Signed)
She has intolerance to allopurinol and uloric.  She reports that she had a mild  flare last week after eating a particular soup  Lab Results  Component Value Date   LABURIC 7.5 (H) 12/12/2016

## 2022-06-28 NOTE — Assessment & Plan Note (Signed)
Managed with  wellbutrin to address concentration and fatigue, and  sertraline for the anxiety

## 2022-06-28 NOTE — Assessment & Plan Note (Addendum)
Not at goal  on current regimen of amlodipine 2.5 mg ,  Metoprolol 25 mg bid,  And losartan 100 mg daily .increase amlodipine to 5 mg daily  Renal function stable, no changes today.  Lab Results  Component Value Date   CREATININE 0.62 05/28/2022   Lab Results  Component Value Date   NA 143 05/28/2022   K 4.5 05/28/2022   CL 104 05/28/2022   CO2 25 05/28/2022

## 2022-06-28 NOTE — Assessment & Plan Note (Signed)
She has  evidence of moderate  atherosclerosis in the aorta noted on CT of abdomen and  pelvis and the prognostic implications of this finding. She is statin intolerant due to myalgias

## 2022-06-30 LAB — MICROALBUMIN / CREATININE URINE RATIO
Creatinine, Urine: 68.2 mg/dL
Microalb/Creat Ratio: 14 mg/g creat (ref 0–29)
Microalbumin, Urine: 9.4 ug/mL

## 2022-07-14 ENCOUNTER — Encounter: Payer: Self-pay | Admitting: Internal Medicine

## 2022-07-15 NOTE — Telephone Encounter (Signed)
Spoke with pt and schedule her for an appt tomorrow with Dr. Caryl Bis to be reevaluated.

## 2022-07-16 ENCOUNTER — Ambulatory Visit (INDEPENDENT_AMBULATORY_CARE_PROVIDER_SITE_OTHER): Payer: PPO | Admitting: Family Medicine

## 2022-07-16 ENCOUNTER — Encounter: Payer: Self-pay | Admitting: Family Medicine

## 2022-07-16 VITALS — BP 124/82 | HR 72 | Temp 98.0°F | Ht 62.0 in | Wt 156.6 lb

## 2022-07-16 DIAGNOSIS — H109 Unspecified conjunctivitis: Secondary | ICD-10-CM | POA: Diagnosis not present

## 2022-07-16 DIAGNOSIS — Z950 Presence of cardiac pacemaker: Secondary | ICD-10-CM | POA: Diagnosis not present

## 2022-07-16 MED ORDER — KETOTIFEN FUMARATE 0.025 % OP SOLN: 1.0000 [drp] | Freq: Two times a day (BID) | OPHTHALMIC | 0 refills | Status: DC

## 2022-07-16 NOTE — Patient Instructions (Addendum)
Nice to see you. Your eye symptoms are probably related to allergies or a viral illness.  Please try ketotifen eye drops over the counter to see if that will help.  If you notice any changes around your pacemaker please seek medical attention.

## 2022-07-16 NOTE — Assessment & Plan Note (Signed)
Improving.  Suspect related to viral illness versus allergic conjunctivitis.  Advised that the Polytrim is likely not doing much for her.  Discussed use of ketotifen eyedrops over-the-counter.  Discussed her symptoms are likely improve/resolve over the next week or 2.

## 2022-07-16 NOTE — Progress Notes (Signed)
Tommi Rumps, MD Phone: [phone]  Julia Pierce is a 67 y.o. female who presents today for same day visit.   Conjunctivitis: Patient notes on 3/8 she felt like there was something in her eye.  She developed some discharge and was placed on Polytrim by her PCP.  Her eyes became red in the sclera and her eyelids became swollen and red.  She notes the eyelid swelling has improved.  The scleral erythema has improved some as well.  She still has a little bit of discharge stuck in the corners of both eyes now.  She noted her vision was fuzzy initially though it has returned back to her baseline.  Initially she has some discomfort in her eyes when she blinks though that has improved.  No photophobia.  She did have some congestion initially.  Does have a history of allergies and has recently restarted Flonase.  Patient notes she had her pacemaker replaced recently.  She notes has been a little redness that appears to be skin irritation around part of the pacemaker site.  She notes there is no pain, warmth, or swelling.  She notes the redness has not been spreading and it has been present for 3 days.  Social History   Tobacco Use  Smoking Status Never  Smokeless Tobacco Never    Current Outpatient Medications on File Prior to Visit  Medication Sig Dispense Refill   acetaminophen (TYLENOL) 500 MG tablet Take 500-1,000 mg by mouth every 6 (six) hours as needed (pain).     Alpha-D-Galactosidase (BEANO PO) Take 1 tablet by mouth 3 (three) times daily before meals.     amLODipine (NORVASC) 2.5 MG tablet TAKE 1 TABLET BY MOUTH EVERY DAY 90 tablet 3   Azelaic Acid 15 % cream Apply 1 Application topically in the morning and at bedtime.     B Complex Vitamins (B COMPLEX PO) Take 1 tablet by mouth in the morning.     blood glucose meter kit and supplies Use to check blood sugars once daily daily. (FOR ICD-10 E11.9). 1 each 0   buPROPion (WELLBUTRIN XL) 150 MG 24 hr tablet Take 1 tablet (150 mg total)  by mouth daily. 90 tablet 1   buPROPion (WELLBUTRIN XL) 150 MG 24 hr tablet TAKE 1 TABLET BY MOUTH EVERY DAY 90 tablet 1   calcium elemental as carbonate (BARIATRIC TUMS ULTRA) 400 MG chewable tablet Chew 400 mg by mouth in the morning.     carvedilol (COREG) 25 MG tablet Take 1 tablet (25 mg total) by mouth 2 (two) times daily. 180 tablet 3   Cholecalciferol (VITAMIN D3) 2000 UNITS TABS Take 2,000 Units by mouth in the morning.     diphenoxylate-atropine (LOMOTIL) 2.5-0.025 MG tablet TAKE 1 TABLET BY MOUTH 4 (FOUR) TIMES DAILY AS NEEDED FOR DIARRHEA OR LOOSE STOOLS. 30 tablet 1   Esomeprazole Magnesium 20 MG TBEC Take 20 mg by mouth daily before breakfast.     fluticasone (FLONASE) 50 MCG/ACT nasal spray Place 2 sprays into both nostrils 2 (two) times daily as needed for allergies.     glucose blood test strip Use to check blood sugars 1 time daily. Accu-chek test strips 100 each 12   losartan (COZAAR) 100 MG tablet Take 100 mg by mouth every evening.     methocarbamol (ROBAXIN) 500 MG tablet TAKE 1 TABLET BY MOUTH 4 TIMES DAILY. (Patient taking differently: Take 500 mg by mouth 4 (four) times daily as needed for muscle spasms.) 90 tablet 2  Multiple Vitamin (MULTIVITAMIN) tablet Take 1 tablet by mouth in the morning.     Polyethyl Glycol-Propyl Glycol (SYSTANE ULTRA) 0.4-0.3 % SOLN Place 1-2 drops into both eyes 3 (three) times daily as needed (dry/irritated eyes.).     sertraline (ZOLOFT) 100 MG tablet TAKE 1 TABLET BY MOUTH EVERY DAY 90 tablet 1   tolterodine (DETROL) 2 MG tablet TAKE 1 TABLET BY MOUTH TWICE A DAY 180 tablet 3   trimethoprim-polymyxin b (POLYTRIM) ophthalmic solution Place 1 drop into both eyes every 6 (six) hours. 10 mL 0   Current Facility-Administered Medications on File Prior to Visit  Medication Dose Route Frequency Provider Last Rate Last Admin   diclofenac sodium (VOLTAREN) 1 % transdermal gel 4 g  4 g Topical QID Mohammed Kindle, MD       midazolam (VERSED) 5 MG/5ML  injection 5 mg  5 mg Intravenous Once Mohammed Kindle, MD       orphenadrine (NORFLEX) injection 60 mg  60 mg Intramuscular Once Mohammed Kindle, MD       sodium chloride 0.9 % injection 20 mL  20 mL Other Once Mohammed Kindle, MD       triamcinolone acetonide (KENALOG-40) injection 40 mg  40 mg Other Once Mohammed Kindle, MD         ROS see history of present illness  Objective  Physical Exam Vitals:   07/16/22 1549  BP: 124/82  Pulse: 72  Temp: 98 F (36.7 C)  SpO2: 96%    BP Readings from Last 3 Encounters:  07/16/22 124/82  06/27/22 130/86  06/02/22 (!) 141/81   Wt Readings from Last 3 Encounters:  07/16/22 156 lb 9.6 oz (71 kg)  06/27/22 154 lb 12.8 oz (70.2 kg)  06/02/22 156 lb (70.8 kg)    Physical Exam Constitutional:      General: She is not in acute distress.    Appearance: She is not diaphoretic.  Eyes:     Pupils: Pupils are equal, round, and reactive to light.     Comments: Conjunctivae are slightly erythematous with some wateriness though no significant discharge, cornea is grossly normal  Cardiovascular:     Rate and Rhythm: Normal rate and regular rhythm.     Heart sounds: Normal heart sounds.  Pulmonary:     Effort: Pulmonary effort is normal.     Breath sounds: Normal breath sounds.  Skin:    General: Skin is warm and dry.  Neurological:     Mental Status: She is alert.      Vision screening with glasses Left eye 20/25 Right eye 20/20 Both eyes 20/15  Assessment/Plan: Please see individual problem list.  Conjunctivitis of both eyes, unspecified conjunctivitis type Assessment & Plan: Improving.  Suspect related to viral illness versus allergic conjunctivitis.  Advised that the Polytrim is likely not doing much for her.  Discussed use of ketotifen eyedrops over-the-counter.  Discussed her symptoms are likely improve/resolve over the next week or 2.  Orders: -     Ketotifen Fumarate; Place 1 drop into both eyes 2 (two) times daily.   Dispense: 5 mL; Refill: 0  Biventricular cardiac pacemaker in situ Assessment & Plan: The skin just appears irritated.  She will monitor and if she has spreading redness or if she develops pain, warmth, or swelling she will seek medical attention.  If she notices any changes she will be reevaluated.     Return if symptoms worsen or fail to improve.   Tommi Rumps, MD [facility]  [place]

## 2022-07-16 NOTE — Assessment & Plan Note (Signed)
The skin just appears irritated.  She will monitor and if she has spreading redness or if she develops pain, warmth, or swelling she will seek medical attention.  If she notices any changes she will be reevaluated.

## 2022-08-19 DIAGNOSIS — H903 Sensorineural hearing loss, bilateral: Secondary | ICD-10-CM | POA: Diagnosis not present

## 2022-09-03 ENCOUNTER — Ambulatory Visit (INDEPENDENT_AMBULATORY_CARE_PROVIDER_SITE_OTHER): Payer: PPO

## 2022-09-03 DIAGNOSIS — I442 Atrioventricular block, complete: Secondary | ICD-10-CM

## 2022-09-03 LAB — CUP PACEART REMOTE DEVICE CHECK
Battery Remaining Longevity: 105 mo
Battery Voltage: 3.16 V
Brady Statistic AP VP Percent: 0.07 %
Brady Statistic AP VS Percent: 0.01 %
Brady Statistic AS VP Percent: 99.92 %
Brady Statistic AS VS Percent: 0 %
Brady Statistic RA Percent Paced: 0.08 %
Brady Statistic RV Percent Paced: 99.99 %
Date Time Interrogation Session: 20240514191039
Implantable Lead Connection Status: 753985
Implantable Lead Connection Status: 753985
Implantable Lead Connection Status: 753985
Implantable Lead Implant Date: 20150212
Implantable Lead Implant Date: 20150212
Implantable Lead Implant Date: 20150212
Implantable Lead Location: 753858
Implantable Lead Location: 753859
Implantable Lead Location: 753860
Implantable Lead Model: 4194
Implantable Lead Model: 5076
Implantable Lead Model: 5076
Implantable Pulse Generator Implant Date: 20240212
Lead Channel Impedance Value: 285 Ohm
Lead Channel Impedance Value: 361 Ohm
Lead Channel Impedance Value: 380 Ohm
Lead Channel Impedance Value: 399 Ohm
Lead Channel Impedance Value: 418 Ohm
Lead Channel Impedance Value: 418 Ohm
Lead Channel Impedance Value: 475 Ohm
Lead Channel Impedance Value: 551 Ohm
Lead Channel Impedance Value: 551 Ohm
Lead Channel Pacing Threshold Amplitude: 0.75 V
Lead Channel Pacing Threshold Amplitude: 1 V
Lead Channel Pacing Threshold Amplitude: 1 V
Lead Channel Pacing Threshold Pulse Width: 0.4 ms
Lead Channel Pacing Threshold Pulse Width: 0.4 ms
Lead Channel Pacing Threshold Pulse Width: 0.8 ms
Lead Channel Sensing Intrinsic Amplitude: 1.375 mV
Lead Channel Sensing Intrinsic Amplitude: 1.375 mV
Lead Channel Setting Pacing Amplitude: 1.5 V
Lead Channel Setting Pacing Amplitude: 2 V
Lead Channel Setting Pacing Amplitude: 2.5 V
Lead Channel Setting Pacing Pulse Width: 0.4 ms
Lead Channel Setting Pacing Pulse Width: 0.8 ms
Lead Channel Setting Sensing Sensitivity: 0.9 mV
Zone Setting Status: 755011

## 2022-09-10 DIAGNOSIS — H903 Sensorineural hearing loss, bilateral: Secondary | ICD-10-CM | POA: Diagnosis not present

## 2022-09-18 ENCOUNTER — Ambulatory Visit: Payer: PPO | Attending: Internal Medicine | Admitting: Internal Medicine

## 2022-09-18 ENCOUNTER — Encounter: Payer: Self-pay | Admitting: Internal Medicine

## 2022-09-18 VITALS — BP 138/78 | HR 72 | Ht 62.0 in | Wt 159.6 lb

## 2022-09-18 DIAGNOSIS — I447 Left bundle-branch block, unspecified: Secondary | ICD-10-CM

## 2022-09-18 DIAGNOSIS — Z950 Presence of cardiac pacemaker: Secondary | ICD-10-CM | POA: Diagnosis not present

## 2022-09-18 DIAGNOSIS — I5022 Chronic systolic (congestive) heart failure: Secondary | ICD-10-CM | POA: Diagnosis not present

## 2022-09-18 NOTE — Progress Notes (Signed)
Remote pacemaker transmission.   

## 2022-09-18 NOTE — Patient Instructions (Addendum)
Medication Instructions:  Your physician recommends that you continue on your current medications as directed. Please refer to the Current Medication list given to you today.  *If you need a refill on your cardiac medications before your next appointment, please call your pharmacy*  Lab Work: None ordered.  If you have labs (blood work) drawn today and your tests are completely normal, you will receive your results only by: MyChart Message (if you have MyChart) OR A paper copy in the mail If you have any lab test that is abnormal or we need to change your treatment, we will call you to review the results.  Testing/Procedures: None ordered.  Follow-Up: At [facility], you and your health needs are our priority.  As part of our continuing mission to provide you with exceptional heart care, we have created designated Provider Care Teams.  These Care Teams include your primary Cardiologist (physician) and Advanced Practice Providers (APPs -  Physician Assistants and Nurse Practitioners) who all work together to provide you with the care you need, when you need it.  Your next appointment:   1 year(s)  The format for your next appointment:   In Person  Provider:   Lewayne Bunting, MD{or one of the following Advanced Practice Providers on your designated Care Team:   Francis Dowse, [place] Casimiro Needle "Mardelle Matte" Lanna Poche, [place]  Remote monitoring is used to monitor your Pacemaker from home. This monitoring reduces the number of office visits required to check your device to one time per year. It allows [place] to keep an eye on the functioning of your device to ensure it is working properly. You are scheduled for a device check from home on 8/14. You may send your transmission at any time that day. If you have a wireless device, the transmission will be sent automatically. After your physician reviews your transmission, you will receive a postcard with your next transmission date.

## 2022-09-18 NOTE — Progress Notes (Signed)
HPI Julia Pierce returns today for ongoing evaluation and management of her biventricular pacemaker and chronic systolic heart failure.  In the interim she has been stable.  Her weight is unchanged.  She denies syncope, chest pain, or shortness of breath.  She admits to being a little more sedentary. She underwent gen change out 3 months ago. Allergies  Allergen Reactions   Cephalexin Anaphylaxis   Penicillins Hives, Shortness Of Breath and Swelling   Cymbalta [Duloxetine Hcl] Other (See Comments)    Muscle pain.   Lipitor [Atorvastatin] Other (See Comments)    Muscle Pain/myalgias    No [employer] Food Allergies     Corn product/by products-diarrhea   Statins     Severe myalgias   Tricor [Fenofibrate]     Muscle pain    Zofran [Ondansetron] Other (See Comments)    Prolong QT   Latex Rash   Pecan Nut (Diagnostic) Other (See Comments)    cystic acne     Current Outpatient Medications  Medication Sig Dispense Refill   acetaminophen (TYLENOL) 500 MG tablet Take 500-1,000 mg by mouth every 6 (six) hours as needed (pain).     Alpha-D-Galactosidase (BEANO PO) Take 1 tablet by mouth 3 (three) times daily before meals.     amLODipine (NORVASC) 2.5 MG tablet TAKE 1 TABLET BY MOUTH EVERY DAY 90 tablet 3   Azelaic Acid 15 % cream Apply 1 Application topically in the morning and at bedtime.     B Complex Vitamins (B COMPLEX PO) Take 1 tablet by mouth in the morning.     blood glucose meter kit and supplies Use to check blood sugars once daily daily. (FOR ICD-10 E11.9). 1 each 0   buPROPion (WELLBUTRIN XL) 150 MG 24 hr tablet TAKE 1 TABLET BY MOUTH EVERY DAY 90 tablet 1   calcium elemental as carbonate (BARIATRIC TUMS ULTRA) 400 MG chewable tablet Chew 400 mg by mouth in the morning.     carvedilol (COREG) 25 MG tablet Take 1 tablet (25 mg total) by mouth 2 (two) times daily. 180 tablet 3   Cholecalciferol (VITAMIN D3) 2000 UNITS TABS Take 2,000 Units by mouth in the morning.      diphenoxylate-atropine (LOMOTIL) 2.5-0.025 MG tablet TAKE 1 TABLET BY MOUTH 4 (FOUR) TIMES DAILY AS NEEDED FOR DIARRHEA OR LOOSE STOOLS. 30 tablet 1   Esomeprazole Magnesium 20 MG TBEC Take 20 mg by mouth daily before breakfast.     fluticasone (FLONASE) 50 MCG/ACT nasal spray Place 2 sprays into both nostrils 2 (two) times daily as needed for allergies.     glucose blood test strip Use to check blood sugars 1 time daily. Accu-chek test strips 100 each 12   ketotifen (ZADITOR) 0.025 % ophthalmic solution Place 1 drop into both eyes 2 (two) times daily. 5 mL 0   losartan (COZAAR) 100 MG tablet Take 100 mg by mouth every evening.     methocarbamol (ROBAXIN) 500 MG tablet TAKE 1 TABLET BY MOUTH 4 TIMES DAILY. (Patient taking differently: Take 500 mg by mouth 4 (four) times daily as needed for muscle spasms.) 90 tablet 2   Multiple Vitamin (MULTIVITAMIN) tablet Take 1 tablet by mouth in the morning.     Polyethyl Glycol-Propyl Glycol (SYSTANE ULTRA) 0.4-0.3 % SOLN Place 1-2 drops into both eyes 3 (three) times daily as needed (dry/irritated eyes.).     sertraline (ZOLOFT) 100 MG tablet TAKE 1 TABLET BY MOUTH EVERY DAY 90 tablet 1   tolterodine (  DETROL) 2 MG tablet TAKE 1 TABLET BY MOUTH TWICE A DAY 180 tablet 3   trimethoprim-polymyxin b (POLYTRIM) ophthalmic solution Place 1 drop into both eyes every 6 (six) hours. 10 mL 0   Current Facility-Administered Medications  Medication Dose Route Frequency Provider Last Rate Last Admin   diclofenac sodium (VOLTAREN) 1 % transdermal gel 4 g  4 g Topical QID Ewing Schlein, MD       midazolam (VERSED) 5 MG/5ML injection 5 mg  5 mg Intravenous Once Ewing Schlein, MD       orphenadrine (NORFLEX) injection 60 mg  60 mg Intramuscular Once Ewing Schlein, MD       sodium chloride 0.9 % injection 20 mL  20 mL Other Once Ewing Schlein, MD       triamcinolone acetonide (KENALOG-40) injection 40 mg  40 mg Other Once Ewing Schlein, MD         Past Medical  History:  Diagnosis Date   Anxiety    Arthritis    Cardiac arrhythmia due to congenital heart disease    Chicken pox    Chronic systolic heart failure (HCC) 09/13/2013   EF now 55%  Bu June 2019   ECHO   Depression    Diabetes (HCC)    Erythema migrans (Lyme disease) 12/06/2012   Fibromyalgia    Headache, frequent episodic tension-type    High cholesterol    History of high blood pressure    readings   Holter monitor, abnormal 11/2010   done for long QT.  some contractile asynchrony Gwen Pounds)   Hx of colonoscopy 12/2010   normal,  next due 2022, Renae Fickle [place]   Hyperlipidemia    Hypertension    Morton's neuroma    bilateral   Pacemaker    3 wire w/o difibrulator   Syncope     ROS:   All systems reviewed and negative except as noted in the HPI.   Past Surgical History:  Procedure Laterality Date   ABDOMINAL HYSTERECTOMY  Dec 2013   [place]   APPENDECTOMY  1973   BI-VENTRICULAR PACEMAKER INSERTION (CRT-P)  05/2013   MDT CRTP implanted by Dr Ladona Ridgel for cardiomyopathy, LBBB, and syncope   BIV PACEMAKER GENERATOR CHANGEOUT N/A 06/02/2022   Procedure: BIV PACEMAKER GENERATOR CHANGEOUT;  Surgeon: Marinus Maw, MD;  Location: [facility] INVASIVE CV LAB;  Service: Cardiovascular;  Laterality: N/A;   HYSTEROSCOPY  2005   heavy bleeding   IMPLANTABLE CARDIOVERTER DEFIBRILLATOR IMPLANT N/A 06/02/2013   Procedure: IMPLANTABLE CARDIOVERTER DEFIBRILLATOR IMPLANT;  Surgeon: Marinus Maw, MD;  Location: [facility] CATH LAB;  Service: Cardiovascular;  Laterality: N/A;   SPINAL FUSION  03/2007   ruptured disc  L5  [name]   TONSILLECTOMY AND ADENOIDECTOMY  1966     Family History  Problem Relation Age of Onset   Arthritis Mother    Hyperlipidemia Mother    Cirrhosis Mother    Arthritis Father    Prostate cancer Father    Hyperlipidemia Father    Pulmonary embolism Father 34       blood clots   Alzheimer's disease Father    Diabetes Brother    Prostate cancer Brother    Seizures Son     Epilepsy Son    ADD / ADHD Son    Breast cancer Maternal Aunt    Diabetes Maternal Uncle    Other Maternal Grandmother        Cerebral hemorrhage   Heart disease Maternal Grandfather    Coronary  artery disease Paternal Grandfather 43     Social History   Socioeconomic History   Marital status: Married    Spouse name: Not on file   Number of children: 2   Years of education: Not on file   Highest education level: Not on file  Occupational History   Occupation: retired  Tobacco Use   Smoking status: Never   Smokeless tobacco: Never  Vaping Use   Vaping Use: Never used  Substance and Sexual Activity   Alcohol use: No   Drug use: No   Sexual activity: Yes  Other Topics Concern   Not on file  Social History Narrative   Not on file   Social Determinants of Health   Financial Resource Strain: Low Risk  (04/07/2022)   Overall Financial Resource Strain (CARDIA)    Difficulty of Paying Living Expenses: Not hard at all  Food Insecurity: No Food Insecurity (04/07/2022)   Hunger Vital Sign    Worried About Running Out of Food in the Last Year: Never true    Ran Out of Food in the Last Year: Never true  Transportation Needs: No Transportation Needs (04/07/2022)   PRAPARE - Administrator, Civil Service (Medical): No    Lack of Transportation (Non-Medical): No  Physical Activity: Unknown (03/21/2021)   Exercise Vital Sign    Days of Exercise per Week: 0 days    Minutes of Exercise per Session: Not on file  Stress: No Stress Concern Present (04/07/2022)   [employer] of Occupational Health - Occupational Stress Questionnaire    Feeling of Stress : Only a little  Social Connections: Unknown (04/07/2022)   Social Connection and Isolation Panel [NHANES]    Frequency of Communication with Friends and Family: Not on file    Frequency of Social Gatherings with Friends and Family: Not on file    Attends Religious Services: Not on file    Active Member of Clubs or  Organizations: Not on file    Attends Banker Meetings: Not on file    Marital Status: Married  Intimate Partner Violence: Not At Risk (04/07/2022)   Humiliation, Afraid, Rape, and Kick questionnaire    Fear of Current or Ex-Partner: No    Emotionally Abused: No    Physically Abused: No    Sexually Abused: No     BP 138/78   Pulse 72   Ht 5\' 2"  (1.575 m)   Wt 159 lb 9.6 oz (72.4 kg)   SpO2 96%   BMI 29.19 kg/m   Physical Exam:  Well appearing NAD HEENT: Unremarkable Neck:  No JVD, no thyromegally Lymphatics:  No adenopathy Back:  No CVA tenderness Lungs:  Clear HEART:  Regular rate rhythm, no murmurs, no rubs, no clicks Abd:  soft, positive bowel sounds, no organomegally, no rebound, no guarding Ext:  2 plus pulses, no edema, no cyanosis, no clubbing Skin:  No rashes no nodules Neuro:  CN II through XII intact, motor grossly intact  EKG - nsr with biv pacing  DEVICE  Normal device function.  See PaceArt for details.   Assess/Plan:  1. Chronic systolic heart failure - her symptoms are class 2. She will continue her current meds. Her fluid was up in the fall but better now. 2. CHB - she has no escape today. She is asymptomatic, s/p PPM 3. Hair loss - this appears to be improved since switching to coreg 4. HTN - her bp is improved. We will follow.  Julia Gowda Nezzie Manera,MD

## 2022-10-09 DIAGNOSIS — I428 Other cardiomyopathies: Secondary | ICD-10-CM | POA: Diagnosis not present

## 2022-10-09 DIAGNOSIS — I5022 Chronic systolic (congestive) heart failure: Secondary | ICD-10-CM | POA: Diagnosis not present

## 2022-10-09 DIAGNOSIS — Z950 Presence of cardiac pacemaker: Secondary | ICD-10-CM | POA: Diagnosis not present

## 2022-10-09 DIAGNOSIS — E7800 Pure hypercholesterolemia, unspecified: Secondary | ICD-10-CM | POA: Diagnosis not present

## 2022-10-09 DIAGNOSIS — I10 Essential (primary) hypertension: Secondary | ICD-10-CM | POA: Diagnosis not present

## 2022-10-09 DIAGNOSIS — E781 Pure hyperglyceridemia: Secondary | ICD-10-CM | POA: Diagnosis not present

## 2022-10-20 DIAGNOSIS — Z124 Encounter for screening for malignant neoplasm of cervix: Secondary | ICD-10-CM | POA: Diagnosis not present

## 2022-10-20 DIAGNOSIS — Z1231 Encounter for screening mammogram for malignant neoplasm of breast: Secondary | ICD-10-CM | POA: Diagnosis not present

## 2022-10-20 DIAGNOSIS — Z90711 Acquired absence of uterus with remaining cervical stump: Secondary | ICD-10-CM | POA: Diagnosis not present

## 2022-10-20 DIAGNOSIS — Z01419 Encounter for gynecological examination (general) (routine) without abnormal findings: Secondary | ICD-10-CM | POA: Diagnosis not present

## 2022-10-20 DIAGNOSIS — Z01411 Encounter for gynecological examination (general) (routine) with abnormal findings: Secondary | ICD-10-CM | POA: Diagnosis not present

## 2022-10-20 LAB — HM MAMMOGRAPHY

## 2022-10-24 ENCOUNTER — Other Ambulatory Visit: Payer: Self-pay | Admitting: Internal Medicine

## 2022-10-27 ENCOUNTER — Ambulatory Visit (INDEPENDENT_AMBULATORY_CARE_PROVIDER_SITE_OTHER): Payer: PPO | Admitting: Internal Medicine

## 2022-10-27 VITALS — BP 138/80 | HR 69 | Temp 98.1°F | Ht 62.0 in | Wt 156.6 lb

## 2022-10-27 DIAGNOSIS — E781 Pure hyperglyceridemia: Secondary | ICD-10-CM | POA: Diagnosis not present

## 2022-10-27 DIAGNOSIS — E1169 Type 2 diabetes mellitus with other specified complication: Secondary | ICD-10-CM

## 2022-10-27 DIAGNOSIS — K580 Irritable bowel syndrome with diarrhea: Secondary | ICD-10-CM

## 2022-10-27 DIAGNOSIS — F320 Major depressive disorder, single episode, mild: Secondary | ICD-10-CM | POA: Diagnosis not present

## 2022-10-27 DIAGNOSIS — I428 Other cardiomyopathies: Secondary | ICD-10-CM | POA: Diagnosis not present

## 2022-10-27 DIAGNOSIS — M65331 Trigger finger, right middle finger: Secondary | ICD-10-CM

## 2022-10-27 DIAGNOSIS — I4581 Long QT syndrome: Secondary | ICD-10-CM

## 2022-10-27 DIAGNOSIS — I10 Essential (primary) hypertension: Secondary | ICD-10-CM | POA: Diagnosis not present

## 2022-10-27 DIAGNOSIS — E559 Vitamin D deficiency, unspecified: Secondary | ICD-10-CM

## 2022-10-27 DIAGNOSIS — I5022 Chronic systolic (congestive) heart failure: Secondary | ICD-10-CM

## 2022-10-27 DIAGNOSIS — G5601 Carpal tunnel syndrome, right upper limb: Secondary | ICD-10-CM

## 2022-10-27 LAB — LIPID PANEL
Cholesterol: 248 mg/dL — ABNORMAL HIGH (ref 0–200)
HDL: 35.8 mg/dL — ABNORMAL LOW (ref >39.00)
Total CHOL/HDL Ratio: 7
Triglycerides: 463 mg/dL — ABNORMAL HIGH (ref 0.0–149.0)

## 2022-10-27 LAB — COMPREHENSIVE METABOLIC PANEL
ALT: 13 U/L (ref 0–35)
AST: 17 U/L (ref 0–37)
Albumin: 4.3 g/dL (ref 3.5–5.2)
Alkaline Phosphatase: 82 U/L (ref 39–117)
BUN: 20 mg/dL (ref 6–23)
CO2: 27 mEq/L (ref 19–32)
Calcium: 9.7 mg/dL (ref 8.4–10.5)
Chloride: 103 mEq/L (ref 96–112)
Creatinine, Ser: 0.77 mg/dL (ref 0.40–1.20)
GFR: 79.35 mL/min (ref >60.00)
Glucose, Bld: 125 mg/dL — ABNORMAL HIGH (ref 70–99)
Potassium: 4.4 mEq/L (ref 3.5–5.1)
Sodium: 139 mEq/L (ref 135–145)
Total Bilirubin: 0.4 mg/dL (ref 0.2–1.2)
Total Protein: 6.8 g/dL (ref 6.0–8.3)

## 2022-10-27 LAB — HEMOGLOBIN A1C: Hgb A1c MFr Bld: 6.1 % (ref 4.6–6.5)

## 2022-10-27 LAB — LDL CHOLESTEROL, DIRECT: Direct LDL: 149 mg/dL

## 2022-10-27 MED ORDER — AMLODIPINE BESYLATE 2.5 MG PO TABS: 2.5000 mg | ORAL_TABLET | Freq: Every day | ORAL | 3 refills | Status: DC

## 2022-10-27 MED ORDER — TOLTERODINE TARTRATE 2 MG PO TABS: 2.0000 mg | ORAL_TABLET | Freq: Two times a day (BID) | ORAL | 3 refills | Status: DC

## 2022-10-27 MED ORDER — CARVEDILOL 25 MG PO TABS: 25.0000 mg | ORAL_TABLET | Freq: Two times a day (BID) | ORAL | 3 refills | Status: DC

## 2022-10-27 MED ORDER — BUPROPION HCL ER (XL) 150 MG PO TB24: 150.0000 mg | ORAL_TABLET | Freq: Every day | ORAL | 1 refills | Status: DC

## 2022-10-27 MED ORDER — LOSARTAN POTASSIUM 100 MG PO TABS: 100.0000 mg | ORAL_TABLET | Freq: Every evening | ORAL | 1 refills | Status: DC

## 2022-10-27 NOTE — Assessment & Plan Note (Signed)
S/p pacer replacement I n February 2024.  Chest wall examined today.  No erythema

## 2022-10-27 NOTE — Assessment & Plan Note (Signed)
Having diarrhea 2 times per week , aggravated by corn products, dark chocolate, apples , raw onion,  cruciferous vegetables (along with increased gas)

## 2022-10-27 NOTE — Progress Notes (Unsigned)
Subjective:  Patient ID: Julia Pierce, female    DOB: 09/15/1955  Age: 67 y.o. MRN: [id number]  CC: The primary encounter diagnosis was Irritable bowel syndrome with diarrhea. 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   HPI Julia Pierce presents for  Chief Complaint  Patient presents with   Medical Management of Chronic Issues    Diabetes, hypertension   1) T2DM:    She  feels generally well,  But  has not resumed exercising regularly  (previously swam 3 days per week)  due to vacation schedules and unpredictable episodes of diarrhea . Checking  blood sugars less than once daily at variable times, usually only if she feels she may be having a hypoglycemic event. .  BS have been under 115 fasting and < 135 post prandially even after dessert/ Denies any recent hypoglyemic events.  Controlling diabetes with diet but has an altered  palette since her trial of Ozempic (due to recurrent vomiting) .   Following a carbohydrate modified diet 6 days per week. Denies numbness, burning and tingling of extremities. Appetite is good.    2) trigger finger right hand  middle finger.  Wakes up in the morning with finger flexed to the palm. Has to manually extend it and has lost strength  3) CTS :  elective surgery on right wrist by Dr. Dutch Quint  was postponed by nephew's death several years  ago.  Symptoms have progressed to involve the ring finger. Grip has been weakened,  starting to drop things.   4) SSS: PM was replaced  on Feb 12 replaced ,  chest wall still swollen slightly over the surgical site.   HAD AN allergic reaction to tegaderm.   5) Hypertension: p. Patient   tried to increase theamlodipine dose to 5 mg as directed at last visit but developed feelings of light headedness with position  change so she resumed 2.5 mg dose .  Still taking losartan 100  mg daily . patient checks blood pressure twice weekly at home.  Readings have been for the most part <130/80 at rest .  Does not salt food our use NSAIDs regularly.   6) Depression:  taking wellbutrin.  Husband aggravates her  with his political views which he shares openly and loudly.  7) Diarrhea:  secondary to corn allergy.     Outpatient Medications Prior to Visit  Medication Sig Dispense Refill   acetaminophen (TYLENOL) 500 MG tablet Take 500-1,000 mg by mouth every 6 (six) hours as needed (pain).     Alpha-D-Galactosidase (BEANO PO) Take 1 tablet by mouth 3 (three) times daily before meals.     Azelaic Acid 15 % cream Apply 1 Application topically in the morning and at bedtime.     B Complex Vitamins (B COMPLEX PO) Take 1 tablet by mouth in the morning.     blood glucose meter kit and supplies Use to check blood sugars once daily daily. (FOR ICD-10 E11.9). 1 each 0   calcium elemental as carbonate (BARIATRIC TUMS ULTRA) 400 MG chewable tablet Chew 400 mg by mouth in the morning.     Cholecalciferol (VITAMIN D3) 2000 UNITS TABS Take 2,000 Units by mouth in the morning.     diphenoxylate-atropine (LOMOTIL) 2.5-0.025 MG  tablet TAKE 1 TABLET BY MOUTH 4 (FOUR) TIMES DAILY AS NEEDED FOR DIARRHEA OR LOOSE STOOLS. 30 tablet 1   Esomeprazole Magnesium 20 MG TBEC Take 20 mg by mouth daily before breakfast.     fluticasone (FLONASE) 50 MCG/ACT nasal spray Place 2 sprays into both nostrils 2 (two) times daily as needed for allergies.     glucose blood test strip Use to check blood sugars 1 time daily. Accu-chek test strips 100 each 12   methocarbamol (ROBAXIN) 500 MG tablet TAKE 1 TABLET BY MOUTH 4 TIMES DAILY. (Patient taking differently: Take 500 mg by mouth 4 (four) times daily as needed for muscle spasms.)  90 tablet 2   Multiple Vitamin (MULTIVITAMIN) tablet Take 1 tablet by mouth in the morning.     Polyethyl Glycol-Propyl Glycol (SYSTANE ULTRA) 0.4-0.3 % SOLN Place 1-2 drops into both eyes 3 (three) times daily as needed (dry/irritated eyes.).     sertraline (ZOLOFT) 100 MG tablet TAKE 1 TABLET BY MOUTH EVERY DAY 90 tablet 1   amLODipine (NORVASC) 2.5 MG tablet TAKE 1 TABLET BY MOUTH EVERY DAY 90 tablet 3   buPROPion (WELLBUTRIN XL) 150 MG 24 hr tablet TAKE 1 TABLET BY MOUTH EVERY DAY 90 tablet 1   carvedilol (COREG) 25 MG tablet Take 1 tablet (25 mg total) by mouth 2 (two) times daily. 180 tablet 3   losartan (COZAAR) 100 MG tablet Take 100 mg by mouth every evening.     tolterodine (DETROL) 2 MG tablet TAKE 1 TABLET BY MOUTH TWICE A DAY 180 tablet 3   ketotifen (ZADITOR) 0.025 % ophthalmic solution Place 1 drop into both eyes 2 (two) times daily. (Patient not taking: Reported on 10/27/2022) 5 mL 0   trimethoprim-polymyxin b (POLYTRIM) ophthalmic solution Place 1 drop into both eyes every 6 (six) hours. (Patient not taking: Reported on 10/27/2022) 10 mL 0   Facility-Administered Medications Prior to Visit  Medication Dose Route Frequency Provider Last Rate Last Admin   diclofenac sodium (VOLTAREN) 1 % transdermal gel 4 g  4 g Topical QID Ewing Schlein, MD       midazolam (VERSED) 5 MG/5ML injection 5 mg  5 mg Intravenous Once Ewing Schlein, MD       orphenadrine (NORFLEX) injection 60 mg  60 mg Intramuscular Once Ewing Schlein, MD       sodium chloride 0.9 % injection 20 mL  20 mL Other Once Ewing Schlein, MD       triamcinolone acetonide (KENALOG-40) injection 40 mg  40 mg Other Once Ewing Schlein, MD        Review of Systems;  Patient denies headache, fevers, malaise, unintentional weight loss, skin rash, eye pain, sinus congestion and sinus pain, sore throat, dysphagia,  hemoptysis , cough, dyspnea, wheezing, chest pain, palpitations, orthopnea, edema, abdominal pain, nausea, melena,   constipation, flank pain, dysuria, hematuria, urinary  Frequency, nocturia, numbness, tingling, seizures,  Focal weakness, Loss of consciousness,  Tremor, insomnia, depression, anxiety, and suicidal ideation.      Objective:  BP 138/80   Pulse 69   Temp 98.1 F (36.7 C) (Oral)   Ht 5\' 2"  (1.575 m)   Wt 156 lb 9.6 oz (71 kg)   SpO2 97%   BMI 28.64 kg/m   BP Readings from Last 3 Encounters:  10/27/22 138/80  09/18/22 138/78  07/16/22 124/82    Wt Readings from Last 3 Encounters:  10/27/22 156 lb 9.6 oz (71 kg)  09/18/22 159 lb 9.6 oz (72.4  kg)  07/16/22 156 lb 9.6 oz (71 kg)    Physical Exam Vitals reviewed.  Constitutional:      General: She is not in acute distress.    Appearance: Normal appearance. She is well-developed and normal weight. She is not ill-appearing, toxic-appearing or diaphoretic.  HENT:     Head: Normocephalic.     Right Ear: Tympanic membrane, ear canal and external ear normal. There is no impacted cerumen.     Left Ear: Tympanic membrane, ear canal and external ear normal. There is no impacted cerumen.     Nose: Nose normal.     Mouth/Throat:     Mouth: Mucous membranes are moist.     Pharynx: Oropharynx is clear.  Eyes:     General: No scleral icterus.       Right eye: No discharge.        Left eye: No discharge.     Conjunctiva/sclera: Conjunctivae normal.     Pupils: Pupils are equal, round, and reactive to light.  Neck:     Thyroid: No thyromegaly.     Vascular: No carotid bruit or JVD.  Cardiovascular:     Rate and Rhythm: Normal rate and regular rhythm.     Heart sounds: Normal heart sounds.  Pulmonary:     Effort: Pulmonary effort is normal. No respiratory distress.     Breath sounds: Normal breath sounds.  Chest:  Breasts:    Breasts are symmetrical.     Right: Normal. No swelling, inverted nipple, mass, nipple discharge, skin change or tenderness.     Left: Normal. No swelling, inverted nipple, mass, nipple discharge, skin change  or tenderness.  Abdominal:     General: Bowel sounds are normal.     Palpations: Abdomen is soft. There is no mass.     Tenderness: There is no abdominal tenderness. There is no guarding or rebound.  Musculoskeletal:        General: Normal range of motion.     Right hand: Decreased strength. Decreased sensation.     Cervical back: Normal range of motion and neck supple.     Comments: middle finger  with rigger finger  motion   Lymphadenopathy:     Cervical: No cervical adenopathy.     Upper Body:     Right upper body: No supraclavicular, axillary or pectoral adenopathy.     Left upper body: No supraclavicular, axillary or pectoral adenopathy.  Skin:    General: Skin is warm and dry.  Neurological:     General: No focal deficit present.     Mental Status: She is alert and oriented to person, place, and time. Mental status is at baseline.  Psychiatric:        Mood and Affect: Mood normal.        Behavior: Behavior normal.        Thought Content: Thought content normal.        Judgment: Judgment normal.    Lab Results  Component Value Date   HGBA1C 6.1 10/27/2022   HGBA1C 6.0 03/03/2022   HGBA1C 5.9 08/30/2021    Lab Results  Component Value Date   CREATININE 0.77 10/27/2022   CREATININE 0.62 05/28/2022   CREATININE 0.76 03/03/2022    Lab Results  Component Value Date   WBC 9.0 05/28/2022   HGB 13.1 05/28/2022   HCT 38.7 05/28/2022   PLT 198 05/28/2022   GLUCOSE 125 (H) 10/27/2022   CHOL 248 (H) 10/27/2022   TRIG (H) 10/27/2022  463.0 Triglyceride is over 400; calculations on Lipids are invalid.   HDL 35.80 (L) 10/27/2022   LDLDIRECT 149.0 10/27/2022   LDLCALC 88 08/17/2013   ALT 13 10/27/2022   AST 17 10/27/2022   NA 139 10/27/2022   K 4.4 10/27/2022   CL 103 10/27/2022   CREATININE 0.77 10/27/2022   BUN 20 10/27/2022   CO2 27 10/27/2022   TSH 3.80 12/12/2016   HGBA1C 6.1 10/27/2022   MICROALBUR 0.7 09/03/2021    EP PPM/ICD IMPLANT  Result Date:  06/02/2022 Conclusion: Successful removal of a previously implanted biventricular pacemaker, and insertion of a new biventricular pacemaker in a patient with complete heart block and chronic systolic heart failure. Sharlot Gowda Taylor,MD    Assessment & Plan:  .Irritable bowel syndrome with diarrhea Assessment & Plan: Having diarrhea 2 times per week , aggravated by corn products, dark chocolate, apples , raw onion,  cruciferous vegetables (along with increased gas)     Hypertriglyceridemia Assessment & Plan: Historically Improved with dietary avoidance  Of chocolate but triglycerides have risen with dietary changes and lack of exercise.   Prior Fenofibrate and statin trials have  caused myalgia .  Will recommend trial of Vascepa   Lab Results  Component Value Date   CHOL 248 (H) 10/27/2022   HDL 35.80 (L) 10/27/2022   LDLCALC 88 08/17/2013   LDLDIRECT 149.0 10/27/2022   TRIG (H) 10/27/2022    463.0 Triglyceride is over 400; calculations on Lipids are invalid.   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  . However  triglycerides are l hgherthan previously  . Patient is reminded to schedule an annual eye exam and foot exam is up to date . Patient has no microalbuminuria. Patient is tolerating ACE/ARB for renal protection and hypertension .  She has deferred jardiance and CBG. Monitor.  She is  fenofibrate and statin intolerant.  Will discuss trial of Vascepa  Lab Results  Component Value Date   HGBA1C 6.1 10/27/2022   Lab Results  Component Value Date   LABMICR 9.4 06/27/2022   MICROALBUR 0.7 09/03/2021   MICROALBUR 1.5 04/02/2020   Lab Results  Component Value Date   CHOL 248 (H) 10/27/2022   HDL 35.80 (L) 10/27/2022   LDLCALC 88 08/17/2013   LDLDIRECT 149.0  10/27/2022   TRIG (H) 10/27/2022    463.0 Triglyceride is over 400; calculations on Lipids are invalid.   CHOLHDL 7 10/27/2022      Orders: -     Hemoglobin A1c -     Comprehensive metabolic panel -     Hemoglobin A1c; Future -     Comprehensive metabolic panel; Future  Primary hypertension Assessment & Plan: Now at goal  on current regimen of amlodipine 2.5 mg ,  carvedilol l 25 mg bid,  And losartan 100 mg daily .She  did not tolerate trial of increased dose of  amlodipine to 5 mg daily  Renal function stable, no changes today.  Lab Results  Component Value Date   CREATININE 0.62 05/28/2022   Lab Results  Component Value Date   NA 143 05/28/2022   K 4.5 05/28/2022   CL 104 05/28/2022   CO2 25 05/28/2022     Orders: -  Comprehensive metabolic panel  Trigger finger, right middle finger Assessment & Plan: Referring to hand surgeon for concurren evaluation of CTS   Orders: -     Ambulatory referral to Hand Surgery  Carpal tunnel syndrome of right wrist -     Ambulatory referral to Hand Surgery  Vitamin D deficiency -     VITAMIN D 25 Hydroxy (Vit-D Deficiency, Fractures); Future  Long QT syndrome Assessment & Plan: S/p pacer replacement I n February 2024.  Chest wall examined today.  No erythema   Chronic systolic heart failure (HCC) Assessment & Plan: Managed medically..  she has no edema on exam .  weight is stable. Currently asymptomatic and 2022 ECHO reviewed.   Continue carvedilol, losartan.  avoid salt.  Encouraged to resume regular exercise      Depression, major, single episode, mild (HCC) Assessment & Plan: Symptoms under control with wellbutrin and sertraline.  Marital conflict discussed,  there is no escalation or abuse.  No changes to regimen today    Non-ischemic cardiomyopathy [facility]) Assessment & Plan:  - her symptoms are class 2. She will continue her current meds.   Other orders -     amLODIPine Besylate; Take 1 tablet (2.5 mg  total) by mouth daily.  Dispense: 90 tablet; Refill: 3 -     buPROPion HCl ER (XL); Take 1 tablet (150 mg total) by mouth daily.  Dispense: 90 tablet; Refill: 1 -     Tolterodine Tartrate; Take 1 tablet (2 mg total) by mouth 2 (two) times daily.  Dispense: 180 tablet; Refill: 3 -     Losartan Potassium; Take 1 tablet (100 mg total) by mouth every evening.  Dispense: 90 tablet; Refill: 1 -     Carvedilol; Take 1 tablet (25 mg total) by mouth 2 (two) times daily.  Dispense: 180 tablet; Refill: 3 -     Icosapent Ethyl; Take 2 capsules (2 g total) by mouth 2 (two) times daily with a meal.  Dispense: 180 capsule; Refill: 1     Follow-up: Return in about 6 months (around 04/29/2023) for follow up diabetes.   Sherlene Shams, MD

## 2022-10-27 NOTE — Assessment & Plan Note (Signed)
Managed medically..  she has no edema on exam .  weight is stable. Currently asymptomatic and 2022 ECHO reviewed.   Continue carvedilol, losartan.  avoid salt.  Encouraged to resume regular exercise

## 2022-10-27 NOTE — Patient Instructions (Signed)
  I encourage you to resume regular participation in some for of exercise    The goals for optimal blood pressure management are to keep reading s between 120/70 and 130/80 .  Please continue to check your blood pressure a few times at home and send me the 5 readings so I can determine if you need a change in medication

## 2022-10-27 NOTE — Assessment & Plan Note (Signed)
Now at goal  on current regimen of amlodipine 2.5 mg ,  carvedilol l 25 mg bid,  And losartan 100 mg daily .She  did not tolerate trial of increased dose of  amlodipine to 5 mg daily  Renal function stable, no changes today.  Lab Results  Component Value Date   CREATININE 0.62 05/28/2022   Lab Results  Component Value Date   NA 143 05/28/2022   K 4.5 05/28/2022   CL 104 05/28/2022   CO2 25 05/28/2022

## 2022-10-27 NOTE — Assessment & Plan Note (Signed)
Symptoms under control with wellbutrin and sertraline.  No changes today

## 2022-10-28 ENCOUNTER — Encounter: Payer: Self-pay | Admitting: Internal Medicine

## 2022-10-28 DIAGNOSIS — M65331 Trigger finger, right middle finger: Secondary | ICD-10-CM | POA: Insufficient documentation

## 2022-10-28 DIAGNOSIS — G5601 Carpal tunnel syndrome, right upper limb: Secondary | ICD-10-CM | POA: Insufficient documentation

## 2022-10-28 MED ORDER — ICOSAPENT ETHYL 1 G PO CAPS: 2.0000 g | ORAL_CAPSULE | Freq: Two times a day (BID) | ORAL | 1 refills | Status: DC

## 2022-10-28 NOTE — Assessment & Plan Note (Signed)
-   her symptoms are class 2. She will continue her current meds. 

## 2022-10-28 NOTE — Assessment & Plan Note (Signed)
She has maintained control without anti hyperglycemics.  . However  triglycerides are l hgherthan previously  . Patient is reminded to schedule an annual eye exam and foot exam is up to date . Patient has no microalbuminuria. Patient is tolerating ACE/ARB for renal protection and hypertension .  She has deferred jardiance and CBG. Monitor.  She is  fenofibrate and statin intolerant.  Will discuss trial of Vascepa  Lab Results  Component Value Date   HGBA1C 6.1 10/27/2022   Lab Results  Component Value Date   LABMICR 9.4 06/27/2022   MICROALBUR 0.7 09/03/2021   MICROALBUR 1.5 04/02/2020   Lab Results  Component Value Date   CHOL 248 (H) 10/27/2022   HDL 35.80 (L) 10/27/2022   LDLCALC 88 08/17/2013   LDLDIRECT 149.0 10/27/2022   TRIG (H) 10/27/2022    463.0 Triglyceride is over 400; calculations on Lipids are invalid.   CHOLHDL 7 10/27/2022

## 2022-10-28 NOTE — Assessment & Plan Note (Signed)
Historically Improved with dietary avoidance  Of chocolate but triglycerides have risen with dietary changes and lack of exercise.   Prior Fenofibrate and statin trials have  caused myalgia .  Will recommend trial of Vascepa   Lab Results  Component Value Date   CHOL 248 (H) 10/27/2022   HDL 35.80 (L) 10/27/2022   LDLCALC 88 08/17/2013   LDLDIRECT 149.0 10/27/2022   TRIG (H) 10/27/2022    463.0 Triglyceride is over 400; calculations on Lipids are invalid.   CHOLHDL 7 10/27/2022

## 2022-10-28 NOTE — Assessment & Plan Note (Signed)
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

## 2022-10-29 DIAGNOSIS — I428 Other cardiomyopathies: Secondary | ICD-10-CM | POA: Diagnosis not present

## 2022-11-03 DIAGNOSIS — D3131 Benign neoplasm of right choroid: Secondary | ICD-10-CM | POA: Diagnosis not present

## 2022-11-03 DIAGNOSIS — H2511 Age-related nuclear cataract, right eye: Secondary | ICD-10-CM | POA: Diagnosis not present

## 2022-11-03 DIAGNOSIS — M3501 Sicca syndrome with keratoconjunctivitis: Secondary | ICD-10-CM | POA: Diagnosis not present

## 2022-11-03 LAB — HM DIABETES EYE EXAM

## 2022-11-04 DIAGNOSIS — M65332 Trigger finger, left middle finger: Secondary | ICD-10-CM | POA: Diagnosis not present

## 2022-11-21 DIAGNOSIS — Z872 Personal history of diseases of the skin and subcutaneous tissue: Secondary | ICD-10-CM | POA: Diagnosis not present

## 2022-11-21 DIAGNOSIS — L570 Actinic keratosis: Secondary | ICD-10-CM | POA: Diagnosis not present

## 2022-11-21 DIAGNOSIS — L814 Other melanin hyperpigmentation: Secondary | ICD-10-CM | POA: Diagnosis not present

## 2022-11-21 DIAGNOSIS — L718 Other rosacea: Secondary | ICD-10-CM | POA: Diagnosis not present

## 2022-11-21 DIAGNOSIS — L821 Other seborrheic keratosis: Secondary | ICD-10-CM | POA: Diagnosis not present

## 2022-11-21 DIAGNOSIS — Z86018 Personal history of other benign neoplasm: Secondary | ICD-10-CM | POA: Diagnosis not present

## 2022-11-21 DIAGNOSIS — L578 Other skin changes due to chronic exposure to nonionizing radiation: Secondary | ICD-10-CM | POA: Diagnosis not present

## 2022-12-03 ENCOUNTER — Ambulatory Visit (INDEPENDENT_AMBULATORY_CARE_PROVIDER_SITE_OTHER): Payer: PPO

## 2022-12-03 DIAGNOSIS — I442 Atrioventricular block, complete: Secondary | ICD-10-CM

## 2022-12-03 LAB — CUP PACEART REMOTE DEVICE CHECK
Battery Remaining Longevity: 102 mo
Battery Voltage: 3.09 V
Brady Statistic AP VP Percent: 0.27 %
Brady Statistic AP VS Percent: 0.01 %
Brady Statistic AS VP Percent: 99.72 %
Brady Statistic AS VS Percent: 0 %
Brady Statistic RA Percent Paced: 0.27 %
Brady Statistic RV Percent Paced: 99.98 %
Date Time Interrogation Session: 20240814002737
Implantable Lead Connection Status: 753985
Implantable Lead Connection Status: 753985
Implantable Lead Connection Status: 753985
Implantable Lead Implant Date: 20150212
Implantable Lead Implant Date: 20150212
Implantable Lead Implant Date: 20150212
Implantable Lead Location: 753858
Implantable Lead Location: 753859
Implantable Lead Location: 753860
Implantable Lead Model: 4194
Implantable Lead Model: 5076
Implantable Lead Model: 5076
Implantable Pulse Generator Implant Date: 20240212
Lead Channel Impedance Value: 266 Ohm
Lead Channel Impedance Value: 361 Ohm
Lead Channel Impedance Value: 380 Ohm
Lead Channel Impedance Value: 380 Ohm
Lead Channel Impedance Value: 418 Ohm
Lead Channel Impedance Value: 418 Ohm
Lead Channel Impedance Value: 456 Ohm
Lead Channel Impedance Value: 551 Ohm
Lead Channel Impedance Value: 551 Ohm
Lead Channel Pacing Threshold Amplitude: 0.75 V
Lead Channel Pacing Threshold Amplitude: 0.875 V
Lead Channel Pacing Threshold Amplitude: 1.125 V
Lead Channel Pacing Threshold Pulse Width: 0.4 ms
Lead Channel Pacing Threshold Pulse Width: 0.4 ms
Lead Channel Pacing Threshold Pulse Width: 0.8 ms
Lead Channel Sensing Intrinsic Amplitude: 1 mV
Lead Channel Sensing Intrinsic Amplitude: 1 mV
Lead Channel Sensing Intrinsic Amplitude: 19 mV
Lead Channel Sensing Intrinsic Amplitude: 19 mV
Lead Channel Setting Pacing Amplitude: 1.5 V
Lead Channel Setting Pacing Amplitude: 2 V
Lead Channel Setting Pacing Amplitude: 2.5 V
Lead Channel Setting Pacing Pulse Width: 0.4 ms
Lead Channel Setting Pacing Pulse Width: 0.8 ms
Lead Channel Setting Sensing Sensitivity: 0.9 mV
Zone Setting Status: 755011

## 2022-12-16 DIAGNOSIS — M65332 Trigger finger, left middle finger: Secondary | ICD-10-CM | POA: Diagnosis not present

## 2022-12-17 NOTE — Progress Notes (Signed)
Remote pacemaker transmission.   

## 2023-02-11 ENCOUNTER — Other Ambulatory Visit: Payer: Self-pay | Admitting: Internal Medicine

## 2023-02-13 ENCOUNTER — Other Ambulatory Visit: Payer: Self-pay | Admitting: Internal Medicine

## 2023-03-03 ENCOUNTER — Encounter: Payer: Self-pay | Admitting: Internal Medicine

## 2023-03-03 NOTE — Telephone Encounter (Signed)
Care team updated and letter sent for eye exam notes.

## 2023-03-04 ENCOUNTER — Ambulatory Visit (INDEPENDENT_AMBULATORY_CARE_PROVIDER_SITE_OTHER): Payer: PPO

## 2023-03-04 DIAGNOSIS — I442 Atrioventricular block, complete: Secondary | ICD-10-CM | POA: Diagnosis not present

## 2023-03-05 LAB — CUP PACEART REMOTE DEVICE CHECK
Battery Remaining Longevity: 101 mo
Battery Voltage: 3.04 V
Brady Statistic AP VP Percent: 0.2 %
Brady Statistic AP VS Percent: 0.01 %
Brady Statistic AS VP Percent: 99.79 %
Brady Statistic AS VS Percent: 0 %
Brady Statistic RA Percent Paced: 0.2 %
Brady Statistic RV Percent Paced: 99.99 %
Date Time Interrogation Session: 20241113074017
Implantable Lead Connection Status: 753985
Implantable Lead Connection Status: 753985
Implantable Lead Connection Status: 753985
Implantable Lead Implant Date: 20150212
Implantable Lead Implant Date: 20150212
Implantable Lead Implant Date: 20150212
Implantable Lead Location: 753858
Implantable Lead Location: 753859
Implantable Lead Location: 753860
Implantable Lead Model: 4194
Implantable Lead Model: 5076
Implantable Lead Model: 5076
Implantable Pulse Generator Implant Date: 20240212
Lead Channel Impedance Value: 285 Ohm
Lead Channel Impedance Value: 361 Ohm
Lead Channel Impedance Value: 380 Ohm
Lead Channel Impedance Value: 418 Ohm
Lead Channel Impedance Value: 437 Ohm
Lead Channel Impedance Value: 475 Ohm
Lead Channel Impedance Value: 532 Ohm
Lead Channel Impedance Value: 570 Ohm
Lead Channel Impedance Value: 627 Ohm
Lead Channel Pacing Threshold Amplitude: 0.75 V
Lead Channel Pacing Threshold Amplitude: 1.125 V
Lead Channel Pacing Threshold Amplitude: 1.125 V
Lead Channel Pacing Threshold Pulse Width: 0.4 ms
Lead Channel Pacing Threshold Pulse Width: 0.4 ms
Lead Channel Pacing Threshold Pulse Width: 0.8 ms
Lead Channel Sensing Intrinsic Amplitude: 1.5 mV
Lead Channel Sensing Intrinsic Amplitude: 1.5 mV
Lead Channel Sensing Intrinsic Amplitude: 19 mV
Lead Channel Sensing Intrinsic Amplitude: 19 mV
Lead Channel Setting Pacing Amplitude: 1.5 V
Lead Channel Setting Pacing Amplitude: 2 V
Lead Channel Setting Pacing Amplitude: 2.5 V
Lead Channel Setting Pacing Pulse Width: 0.4 ms
Lead Channel Setting Pacing Pulse Width: 0.8 ms
Lead Channel Setting Sensing Sensitivity: 0.9 mV
Zone Setting Status: 755011

## 2023-03-13 DIAGNOSIS — H903 Sensorineural hearing loss, bilateral: Secondary | ICD-10-CM | POA: Diagnosis not present

## 2023-03-13 DIAGNOSIS — H90A22 Sensorineural hearing loss, unilateral, left ear, with restricted hearing on the contralateral side: Secondary | ICD-10-CM | POA: Diagnosis not present

## 2023-03-24 NOTE — Progress Notes (Signed)
Remote pacemaker transmission.   

## 2023-03-29 ENCOUNTER — Other Ambulatory Visit: Payer: Self-pay | Admitting: Internal Medicine

## 2023-04-06 DIAGNOSIS — I428 Other cardiomyopathies: Secondary | ICD-10-CM | POA: Diagnosis not present

## 2023-04-06 DIAGNOSIS — Z950 Presence of cardiac pacemaker: Secondary | ICD-10-CM | POA: Diagnosis not present

## 2023-04-06 DIAGNOSIS — E7800 Pure hypercholesterolemia, unspecified: Secondary | ICD-10-CM | POA: Diagnosis not present

## 2023-04-06 DIAGNOSIS — E781 Pure hyperglyceridemia: Secondary | ICD-10-CM | POA: Diagnosis not present

## 2023-04-06 DIAGNOSIS — I5022 Chronic systolic (congestive) heart failure: Secondary | ICD-10-CM | POA: Diagnosis not present

## 2023-04-06 DIAGNOSIS — I10 Essential (primary) hypertension: Secondary | ICD-10-CM | POA: Diagnosis not present

## 2023-04-08 ENCOUNTER — Other Ambulatory Visit: Payer: PPO

## 2023-04-08 DIAGNOSIS — E559 Vitamin D deficiency, unspecified: Secondary | ICD-10-CM | POA: Diagnosis not present

## 2023-04-08 DIAGNOSIS — E781 Pure hyperglyceridemia: Secondary | ICD-10-CM

## 2023-04-08 DIAGNOSIS — E1169 Type 2 diabetes mellitus with other specified complication: Secondary | ICD-10-CM

## 2023-04-08 LAB — COMPREHENSIVE METABOLIC PANEL
ALT: 15 U/L (ref 0–35)
AST: 21 U/L (ref 0–37)
Albumin: 4.2 g/dL (ref 3.5–5.2)
Alkaline Phosphatase: 83 U/L (ref 39–117)
BUN: 18 mg/dL (ref 6–23)
CO2: 30 meq/L (ref 19–32)
Calcium: 9 mg/dL (ref 8.4–10.5)
Chloride: 105 meq/L (ref 96–112)
Creatinine, Ser: 0.78 mg/dL (ref 0.40–1.20)
GFR: 77.89 mL/min (ref >60.00)
Glucose, Bld: 122 mg/dL — ABNORMAL HIGH (ref 70–99)
Potassium: 3.9 meq/L (ref 3.5–5.1)
Sodium: 143 meq/L (ref 135–145)
Total Bilirubin: 0.5 mg/dL (ref 0.2–1.2)
Total Protein: 6.9 g/dL (ref 6.0–8.3)

## 2023-04-08 LAB — LIPID PANEL
Cholesterol: 217 mg/dL — ABNORMAL HIGH (ref 0–200)
HDL: 39.1 mg/dL (ref >39.00)
LDL Cholesterol: 124 mg/dL — ABNORMAL HIGH (ref 0–99)
NonHDL: 178.28
Total CHOL/HDL Ratio: 6
Triglycerides: 273 mg/dL — ABNORMAL HIGH (ref 0.0–149.0)
VLDL: 54.6 mg/dL — ABNORMAL HIGH (ref 0.0–40.0)

## 2023-04-08 LAB — VITAMIN D 25 HYDROXY (VIT D DEFICIENCY, FRACTURES): VITD: 62.22 ng/mL (ref 30.00–100.00)

## 2023-04-08 LAB — LDL CHOLESTEROL, DIRECT: Direct LDL: 149 mg/dL

## 2023-04-08 LAB — HEMOGLOBIN A1C: Hgb A1c MFr Bld: 6.4 % (ref 4.6–6.5)

## 2023-04-09 ENCOUNTER — Ambulatory Visit: Payer: HMO

## 2023-04-29 ENCOUNTER — Encounter: Payer: Self-pay | Admitting: Internal Medicine

## 2023-04-29 ENCOUNTER — Ambulatory Visit: Payer: PPO | Admitting: Internal Medicine

## 2023-04-29 DIAGNOSIS — I10 Essential (primary) hypertension: Secondary | ICD-10-CM | POA: Diagnosis not present

## 2023-04-29 DIAGNOSIS — I5022 Chronic systolic (congestive) heart failure: Secondary | ICD-10-CM | POA: Diagnosis not present

## 2023-04-29 DIAGNOSIS — E781 Pure hyperglyceridemia: Secondary | ICD-10-CM | POA: Diagnosis not present

## 2023-04-29 DIAGNOSIS — F4321 Adjustment disorder with depressed mood: Secondary | ICD-10-CM

## 2023-04-29 DIAGNOSIS — F409 Phobic anxiety disorder, unspecified: Secondary | ICD-10-CM

## 2023-04-29 DIAGNOSIS — E1169 Type 2 diabetes mellitus with other specified complication: Secondary | ICD-10-CM

## 2023-04-29 DIAGNOSIS — M7500 Adhesive capsulitis of unspecified shoulder: Secondary | ICD-10-CM | POA: Insufficient documentation

## 2023-04-29 DIAGNOSIS — I428 Other cardiomyopathies: Secondary | ICD-10-CM

## 2023-04-29 DIAGNOSIS — M7501 Adhesive capsulitis of right shoulder: Secondary | ICD-10-CM

## 2023-04-29 DIAGNOSIS — Z23 Encounter for immunization: Secondary | ICD-10-CM

## 2023-04-29 DIAGNOSIS — M25511 Pain in right shoulder: Secondary | ICD-10-CM

## 2023-04-29 DIAGNOSIS — F5105 Insomnia due to other mental disorder: Secondary | ICD-10-CM | POA: Diagnosis not present

## 2023-04-29 DIAGNOSIS — G8929 Other chronic pain: Secondary | ICD-10-CM

## 2023-04-29 MED ORDER — BUPROPION HCL ER (XL) 150 MG PO TB24: 150.0000 mg | ORAL_TABLET | Freq: Every day | ORAL | 1 refills | Status: DC

## 2023-04-29 NOTE — Assessment & Plan Note (Signed)
 Moderate with crt placement with normal coronaries by cath 2015 now with normal EF of 50% per dec cardiology follow up

## 2023-04-29 NOTE — Assessment & Plan Note (Signed)
 She has maintained control without anti hyperglycemics.  . However  triglycerides are l hgherthan previously  . Patient is reminded to schedule an annual eye exam and foot exam is up to date . Patient has no microalbuminuria. Patient is tolerating ACE/ARB for renal protection and hypertension .  She has deferred jardiance  and CBG. Monitor.  She is  fenofibrate and statin intolerant.  Will discuss trial of Vascepa   Lab Results  Component Value Date   HGBA1C 6.4 04/08/2023   Lab Results  Component Value Date   LABMICR 9.4 06/27/2022   MICROALBUR 0.7 09/03/2021   MICROALBUR 1.5 04/02/2020   Lab Results  Component Value Date   CHOL 217 (H) 04/08/2023   HDL 39.10 04/08/2023   LDLCALC 124 (H) 04/08/2023   LDLDIRECT 149.0 04/08/2023   TRIG 273.0 (H) 04/08/2023   CHOLHDL 6 04/08/2023

## 2023-04-29 NOTE — Assessment & Plan Note (Signed)
 Right shoulder   referring to [facility]

## 2023-04-29 NOTE — Assessment & Plan Note (Signed)
 Now at goal  on current regimen of amlodipine  2.5 mg ,  carvedilol  l 25 mg bid,  And losartan  100 mg daily  Renal function stable, no changes today.  Lab Results  Component Value Date   CREATININE 0.78 04/08/2023   Lab Results  Component Value Date   NA 143 04/08/2023   K 3.9 04/08/2023   CL 105 04/08/2023   CO2 30 04/08/2023

## 2023-04-29 NOTE — Assessment & Plan Note (Signed)
 With elevated LDL of 124.  Statin intolerant.  Advised to consider PCSK9 inhibitor.  She is apprehensive about new meds now that she lives alone

## 2023-04-29 NOTE — Patient Instructions (Signed)
 I am so sorry about your loss .  Please let me know if you want something to help you rest at night.    Your diabetes is well controlled ,  but   Your cholesterol is elevated and should be addressed with medication.   There is an alternative to statin therapy for management of hyperlipidemia.  It is called Repatha,  And it has a very low side effect profile because of its mechanism of action.  It is in a class of drugs called a PCSK9 inhibitor.   Repatha is a monoclonal human antibody that binds to the body's natural inhibitor of the LDL receptors (PCSK9) , preventing the inhibitor from inhibiting!  This effectively increases  the number of LDL receptors available to clear the bad cholesterol (LDL)  from the bloodstream.  It is injected into the skin (at home)  twice per month . The data of its efficacy is very good.  If you would be interested in trying it,  I will try to get it authorized  .  Please talk to your cardiologist about it

## 2023-04-29 NOTE — Assessment & Plan Note (Signed)
 AGGRAVATED BY husband's sudden death .  She declines pharmacotherapy

## 2023-04-29 NOTE — Progress Notes (Signed)
 Subjective:  Patient ID: Julia Pierce, female    DOB: May 22, 1955  Age: 68 y.o. MRN: [id number]  CC: The primary encounter diagnosis was Non-ischemic cardiomyopathy (HCC). 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   HPI Julia Pierce presents for  Chief Complaint  Patient presents with   Medical Management of Chronic Issues    6 month follow up     Grief:  husband died suddenly  in 03-24-2024  during Thanksgivign holiday after several months of mental status changes.  Felt bad,  went to lie down in the afternoon and died in his sleep .  She has been in crisis mode since his death and has not had time to grieve. It still feels surreal   has some nights of insomnia but declines meds due to fear of oversleeping . Using tylelnol.    1) Type 2 DM:   T2DM:  She  feels generally well,  But is not  exercising regularly since her husband passed suddenly. Checking  blood sugars less than once daily at variable times, usually only if she feels she may be having a hypoglycemic event. .  BS have been under 130 fasting and < 150 post prandially.  Denies any recent hypoglyemic events.  Taking   medications as directed. Following a carbohydrate modified diet 6 days per week. Denies numbness, burning and tingling of extremities. Appetite is good.    2) nonischemic cardiomyopathy:  followed up with cardiology in December,  EF normal on repeat ECHO   3) Hypertension: patient checks blood pressure twice weekly at home.  Readings have been for the most part <130/80 at rest . Patient is following a reduced salt diet most days and is taking medications as prescribed   4)  painful,  stiff right shoulder,  requesting referral to  [place] [facility]    Outpatient Medications Prior to Visit  Medication Sig Dispense Refill   acetaminophen  (TYLENOL ) 500 MG tablet Take 500-1,000 mg by mouth every 6 (six) hours as needed (pain).     Alpha-D-Galactosidase (BEANO PO) Take 1 tablet by mouth 3 (three) times daily before meals.     amLODipine  (NORVASC ) 2.5 MG tablet TAKE 1 TABLET BY MOUTH EVERY DAY 90 tablet 3   Azelaic Acid 15 % cream Apply 1 Application topically in the morning and at bedtime.     B Complex Vitamins (B COMPLEX PO) Take 1 tablet by mouth in the morning.     blood glucose meter kit and supplies Use to check blood sugars once daily daily. (FOR ICD-10 E11.9). 1 each 0   calcium  elemental as carbonate (BARIATRIC TUMS ULTRA) 400 MG chewable tablet Chew 400 mg by mouth in the morning.     carvedilol  (COREG ) 25 MG tablet Take 1 tablet (25 mg total) by mouth 2 (two) times daily. 180 tablet 3   Cholecalciferol (VITAMIN D3) 2000 UNITS TABS Take 2,000 Units by mouth in the morning.     diphenoxylate -atropine  (LOMOTIL ) 2.5-0.025 MG tablet TAKE 1 TABLET BY MOUTH UP TO 4 TIMES DAILY AS NEEDED FOR DIARRHEA OR LOOSE STOOLS. 30 tablet 2   Esomeprazole  Magnesium  20 MG TBEC Take 20 mg by mouth daily before breakfast.     fluticasone (FLONASE) 50 MCG/ACT nasal spray Place 2 sprays into both nostrils  2 (two) times daily as needed for allergies.     glucose blood test strip Use to check blood sugars 1 time daily. Accu-chek test strips 100 each 12   losartan  (COZAAR ) 100 MG tablet TAKE 1 TABLET BY MOUTH EVERY EVENING. 90 tablet 1   Multiple Vitamin (MULTIVITAMIN) tablet Take 1 tablet by mouth in the morning.     Polyethyl Glycol-Propyl Glycol (SYSTANE ULTRA) 0.4-0.3 % SOLN Place 1-2 drops into both eyes 3 (three) times daily as needed (dry/irritated eyes.).     sertraline  (ZOLOFT ) 100 MG tablet TAKE 1 TABLET BY MOUTH EVERY DAY 90 tablet 1   tolterodine  (DETROL ) 2 MG tablet Take 1 tablet (2 mg total) by mouth 2 (two) times daily. 180  tablet 3   buPROPion  (WELLBUTRIN  XL) 150 MG 24 hr tablet Take 1 tablet (150 mg total) by mouth daily. 90 tablet 1   icosapent  Ethyl (VASCEPA ) 1 g capsule Take 2 capsules (2 g total) by mouth 2 (two) times daily with a meal. (Patient not taking: Reported on 04/29/2023) 180 capsule 1   methocarbamol  (ROBAXIN ) 500 MG tablet TAKE 1 TABLET BY MOUTH 4 TIMES DAILY. (Patient taking differently: Take 500 mg by mouth 4 (four) times daily as needed for muscle spasms.) 90 tablet 2   Facility-Administered Medications Prior to Visit  Medication Dose Route Frequency Provider Last Rate Last Admin   diclofenac  sodium (VOLTAREN ) 1 % transdermal gel 4 g  4 g Topical QID Dannial Hacker, MD       midazolam  (VERSED ) 5 MG/5ML injection 5 mg  5 mg Intravenous Once Dannial Hacker, MD       orphenadrine  (NORFLEX ) injection 60 mg  60 mg Intramuscular Once Dannial Hacker, MD       sodium chloride  0.9 % injection 20 mL  20 mL Other Once Dannial Hacker, MD       triamcinolone  acetonide (KENALOG -40) injection 40 mg  40 mg Other Once Dannial Hacker, MD        Review of Systems;  Patient denies headache, fevers, malaise, unintentional weight loss, skin rash, eye pain, sinus congestion and sinus pain, sore throat, dysphagia,  hemoptysis , cough, dyspnea, wheezing, chest pain, palpitations, orthopnea, edema, abdominal pain, nausea, melena, diarrhea, constipation, flank pain, dysuria, hematuria, urinary  Frequency, nocturia, numbness, tingling, seizures,  Focal weakness, Loss of consciousness,  Tremor, insomnia, depression, anxiety, and suicidal ideation.      Objective:  There were no vitals taken for this visit.  BP Readings from Last 3 Encounters:  10/27/22 138/80  09/18/22 138/78  07/16/22 124/82    Wt Readings from Last 3 Encounters:  10/27/22 156 lb 9.6 oz (71 kg)  09/18/22 159 lb 9.6 oz (72.4 kg)  07/16/22 156 lb 9.6 oz (71 kg)    Physical Exam Vitals reviewed.  Constitutional:      General: She is not in  acute distress.    Appearance: Normal appearance. She is normal weight. She is not ill-appearing, toxic-appearing or diaphoretic.  HENT:     Head: Normocephalic.  Eyes:     General: No scleral icterus.       Right eye: No discharge.        Left eye: No discharge.     Conjunctiva/sclera: Conjunctivae normal.  Cardiovascular:     Rate and Rhythm: Normal rate and regular rhythm.     Heart sounds: Normal heart sounds.  Pulmonary:     Effort: Pulmonary effort is normal. No respiratory distress.     Breath sounds:  Normal breath sounds.  Musculoskeletal:        General: Normal range of motion.  Skin:    General: Skin is warm and dry.  Neurological:     General: No focal deficit present.     Mental Status: She is alert and oriented to person, place, and time. Mental status is at baseline.  Psychiatric:        Mood and Affect: Mood normal.        Behavior: Behavior normal.        Thought Content: Thought content normal.        Judgment: Judgment normal.    Lab Results  Component Value Date   HGBA1C 6.4 04/08/2023   HGBA1C 6.1 10/27/2022   HGBA1C 6.0 03/03/2022    Lab Results  Component Value Date   CREATININE 0.78 04/08/2023   CREATININE 0.77 10/27/2022   CREATININE 0.62 05/28/2022    Lab Results  Component Value Date   WBC 9.0 05/28/2022   HGB 13.1 05/28/2022   HCT 38.7 05/28/2022   PLT 198 05/28/2022   GLUCOSE 122 (H) 04/08/2023   CHOL 217 (H) 04/08/2023   TRIG 273.0 (H) 04/08/2023   HDL 39.10 04/08/2023   LDLDIRECT 149.0 04/08/2023   LDLCALC 124 (H) 04/08/2023   ALT 15 04/08/2023   AST 21 04/08/2023   NA 143 04/08/2023   K 3.9 04/08/2023   CL 105 04/08/2023   CREATININE 0.78 04/08/2023   BUN 18 04/08/2023   CO2 30 04/08/2023   TSH 3.80 12/12/2016   HGBA1C 6.4 04/08/2023   MICROALBUR 0.7 09/03/2021    EP PPM/ICD IMPLANT Result Date: 06/02/2022 Conclusion: Successful removal of a previously implanted biventricular pacemaker, and insertion of a new  biventricular pacemaker in a patient with complete heart block and chronic systolic heart failure. Danelle Taylor,MD    Assessment & Plan:  .Non-ischemic cardiomyopathy [facility]) Assessment & Plan: Moderate with crt placement with normal coronaries by cath 2015 now with normal EF of 50% per dec cardiology follow up    Hypertriglyceridemia Assessment & Plan: With elevated LDL of 124.  Statin intolerant.  Advised to consider PCSK9 inhibitor.  She is apprehensive about new meds now that she lives alone    Insomnia due to anxiety and fear Assessment & Plan: AGGRAVATED BY husband's sudden death .  She declines pharmacotherapy   Need for influenza vaccination -     Flu Vaccine Trivalent High Dose (Fluad)  Chronic pain in right shoulder -     Ambulatory referral to Orthopedic Surgery  Grief Assessment & Plan: Patient is dealing with the unexpected loss of spouse and has adequate coping skills and emotional support .  i have asked patient to return in one month to examine for signs of unresolving grief.     Chronic systolic heart failure (HCC) Assessment & Plan: Managed medically..  she has no edema on exam .  EF has normalized by current ECHO weight is stable.  Continue carvedilol , losartan .  avoid salt.  Encouraged to resume regular exercise      Primary hypertension Assessment & Plan: Now at goal  on current regimen of amlodipine  2.5 mg ,  carvedilol  l 25 mg bid,  And losartan  100 mg daily  Renal function stable, no changes today.  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  . However  triglycerides are l hgherthan previously  . Patient is reminded to schedule an annual eye exam and foot exam is up to  date . Patient has no microalbuminuria. Patient is tolerating ACE/ARB for renal protection and hypertension .  She has deferred jardiance  and CBG. Monitor.  She is  fenofibrate and statin intolerant.  Will discuss trial of Vascepa   Lab Results  Component Value Date   HGBA1C 6.4 04/08/2023   Lab Results  Component Value Date   LABMICR 9.4 06/27/2022   MICROALBUR 0.7 09/03/2021   MICROALBUR 1.5 04/02/2020   Lab Results  Component Value Date   CHOL 217 (H) 04/08/2023   HDL 39.10 04/08/2023   LDLCALC 124 (H) 04/08/2023   LDLDIRECT 149.0 04/08/2023   TRIG 273.0 (H) 04/08/2023   CHOLHDL 6 04/08/2023       Adhesive capsulitis of right shoulder Assessment & Plan: Right shoulder   referring to [facility]    Other orders -     buPROPion  HCl ER (XL); Take 1 tablet (150 mg total) by mouth daily.  Dispense: 90 tablet; Refill: 1     I provided 48 minutes of face-to-face time during this encounter reviewing patient's last visit with me, patient's  most recent visit with cardiology,    recent surgical and non surgical procedures, previous  labs and imaging studies, counseling on currently addressed issues,  and post visit ordering to diagnostics and therapeutics .   Follow-up: Return in about 6 months (around 10/27/2023) for follow up diabetes, physical.   Verneita LITTIE Kettering, MD

## 2023-04-29 NOTE — Assessment & Plan Note (Signed)
Patient is dealing with the unexpected loss of spouse and has adequate coping skills and emotional support .  i have asked patient to return in one month to examine for signs of unresolving grief.  

## 2023-04-29 NOTE — Assessment & Plan Note (Signed)
 Managed medically..  she has no edema on exam .  EF has normalized by current ECHO weight is stable.  Continue carvedilol, losartan.  avoid salt.  Encouraged to resume regular exercise

## 2023-05-28 DIAGNOSIS — M7541 Impingement syndrome of right shoulder: Secondary | ICD-10-CM | POA: Diagnosis not present

## 2023-06-03 ENCOUNTER — Ambulatory Visit (INDEPENDENT_AMBULATORY_CARE_PROVIDER_SITE_OTHER): Payer: PPO

## 2023-06-03 ENCOUNTER — Ambulatory Visit: Payer: PPO

## 2023-06-03 VITALS — Ht 62.0 in | Wt 153.0 lb

## 2023-06-03 DIAGNOSIS — I428 Other cardiomyopathies: Secondary | ICD-10-CM | POA: Diagnosis not present

## 2023-06-03 DIAGNOSIS — Z0000 Encounter for general adult medical examination without abnormal findings: Secondary | ICD-10-CM

## 2023-06-03 NOTE — Patient Instructions (Signed)
Julia Pierce , Thank you for taking time to come for your Medicare Wellness Visit. I appreciate your ongoing commitment to your health goals. Please review the following plan we discussed and let me know if I can assist you in the future.   Referrals/Orders/Follow-Ups/Clinician Recommendations: Consider updating your shingles vaccines as discussed.  This is a list of the screening recommended for you and due dates:  Health Maintenance  Topic Date Due   Zoster (Shingles) Vaccine (1 of 2) Never done   COVID-19 Vaccine (3 - 2024-25 season) 12/21/2022   Yearly kidney health urinalysis for diabetes  06/27/2023   Hemoglobin A1C  10/07/2023   Mammogram  10/20/2023   Complete foot exam   10/27/2023   Eye exam for diabetics  11/03/2023   Yearly kidney function blood test for diabetes  04/07/2024   Medicare Annual Wellness Visit  06/02/2024   Colon Cancer Screening  12/01/2027   DTaP/Tdap/Td vaccine (3 - Td or Tdap) 03/24/2028   Pneumonia Vaccine  Completed   Flu Shot  Completed   DEXA scan (bone density measurement)  Completed   Hepatitis C Screening  Completed   HPV Vaccine  Aged Out    Advanced directives: (Copy Requested) Please bring a copy of your health care power of attorney and living will to the office to be added to your chart at your convenience.  Next Medicare Annual Wellness Visit scheduled for next year: Yes 06/07/24 @ 8:50

## 2023-06-03 NOTE — Progress Notes (Signed)
 Subjective:   Julia Pierce is a 68 y.o. female who presents for Medicare Annual (Subsequent) preventive examination.  Visit Complete: Virtual I connected with  Julia Pierce on 06/03/23 by a audio enabled telemedicine application and verified that I am speaking with the correct person using two identifiers. This patient declined Interactive audio and Acupuncturist. Therefore the visit was completed with audio only.   Patient Location: Home  Provider Location: Home Office  I discussed the limitations of evaluation and management by telemedicine. The patient expressed understanding and agreed to proceed.  Vital Signs: Because this visit was a virtual/telehealth visit, some criteria may be missing or patient reported. Any vitals not documented were not able to be obtained and vitals that have been documented are patient reported.    Cardiac Risk Factors include: advanced age (>40men, >28 women);diabetes mellitus;hypertension;dyslipidemia     Objective:    Today's Vitals   06/03/23 1304  Weight: 153 lb (69.4 kg)  Height: 5\' 2"  (1.575 m)   Body mass index is 27.98 kg/m.     06/03/2023    1:35 PM 06/02/2022    1:30 PM 04/07/2022   11:33 AM 03/21/2021    1:29 PM 03/20/2020    1:21 PM 02/06/2020   10:00 AM 03/09/2019   11:14 AM  Advanced Directives  Does Patient Have a Medical Advance Directive? Yes Yes Yes No No No No  Type of Estate agent of [place];Living will Healthcare Power of [place];Living will Healthcare Power of [place];Living will      Does patient want to make changes to medical advance directive?  No - Patient declined No - Patient declined      Copy of Healthcare Power of Attorney in Chart? No - copy requested No - copy requested No - copy requested      Would patient like information on creating a medical advance directive?    No - Patient declined No - Patient declined  No - Patient declined    Current Medications  (verified) Outpatient Encounter Medications as of 06/03/2023  Medication Sig   acetaminophen (TYLENOL) 500 MG tablet Take 500-1,000 mg by mouth every 6 (six) hours as needed (pain).   Alpha-D-Galactosidase (BEANO PO) Take 1 tablet by mouth 3 (three) times daily before meals.   amLODipine (NORVASC) 2.5 MG tablet TAKE 1 TABLET BY MOUTH EVERY DAY   Azelaic Acid 15 % cream Apply 1 Application topically in the morning and at bedtime.   B Complex Vitamins (B COMPLEX PO) Take 1 tablet by mouth in the morning.   blood glucose meter kit and supplies Use to check blood sugars once daily daily. (FOR ICD-10 E11.9).   buPROPion (WELLBUTRIN XL) 150 MG 24 hr tablet Take 1 tablet (150 mg total) by mouth daily.   calcium elemental as carbonate (BARIATRIC TUMS ULTRA) 400 MG chewable tablet Chew 400 mg by mouth in the morning.   carvedilol (COREG) 25 MG tablet Take 1 tablet (25 mg total) by mouth 2 (two) times daily.   diphenoxylate-atropine (LOMOTIL) 2.5-0.025 MG tablet TAKE 1 TABLET BY MOUTH UP TO 4 TIMES DAILY AS NEEDED FOR DIARRHEA OR LOOSE STOOLS.   Esomeprazole Magnesium 20 MG TBEC Take 20 mg by mouth daily before breakfast.   fluticasone (FLONASE) 50 MCG/ACT nasal spray Place 2 sprays into both nostrils 2 (two) times daily as needed for allergies.   glucose blood test strip Use to check blood sugars 1 time daily. Accu-chek test strips   losartan (COZAAR) 100 MG  tablet TAKE 1 TABLET BY MOUTH EVERY EVENING.   Multiple Vitamin (MULTIVITAMIN) tablet Take 1 tablet by mouth in the morning.   Polyethyl Glycol-Propyl Glycol (SYSTANE ULTRA) 0.4-0.3 % SOLN Place 1-2 drops into both eyes 3 (three) times daily as needed (dry/irritated eyes.).   sertraline (ZOLOFT) 100 MG tablet TAKE 1 TABLET BY MOUTH EVERY DAY   tolterodine (DETROL) 2 MG tablet Take 1 tablet (2 mg total) by mouth 2 (two) times daily.   Cholecalciferol (VITAMIN D3) 2000 UNITS TABS Take 2,000 Units by mouth in the morning. (Patient not taking: Reported on  06/03/2023)   Facility-Administered Encounter Medications as of 06/03/2023  Medication   diclofenac sodium (VOLTAREN) 1 % transdermal gel 4 g   midazolam (VERSED) 5 MG/5ML injection 5 mg   orphenadrine (NORFLEX) injection 60 mg   sodium chloride 0.9 % injection 20 mL   triamcinolone acetonide (KENALOG-40) injection 40 mg    Allergies (verified) Cephalexin, Penicillins, Semaglutide(0.25 or 0.5mg -dos), Cymbalta [duloxetine hcl], Icosapent ethyl (epa ethyl ester) (fish), Lipitor [atorvastatin], No healthtouch food allergies, Statins, Tricor [fenofibrate], Zofran [ondansetron], Latex, and Pecan nut (diagnostic)   History: Past Medical History:  Diagnosis Date   Anxiety    Arthritis    Bilateral occipital neuralgia 11/02/2014   Cardiac arrhythmia due to congenital heart disease    Chicken pox    Chronic systolic heart failure (HCC) 09/13/2013   EF now 55%  Bu June 2019   ECHO   Depression    Diabetes (HCC)    Erythema migrans (Lyme disease) 12/06/2012   Fibromyalgia    Headache, frequent episodic tension-type    High cholesterol    History of high blood pressure    readings   Holter monitor, abnormal 11/2010   done for long QT.  some contractile asynchrony Gwen Pounds)   Hx of colonoscopy 12/2010   normal,  next due 2022, [name]   Hyperlipidemia    Hypertension    Morton's neuroma    bilateral   Pacemaker    3 wire w/o difibrulator   Syncope    Past Surgical History:  Procedure Laterality Date   ABDOMINAL HYSTERECTOMY  Dec 2013   [place]   APPENDECTOMY  1973   BI-VENTRICULAR PACEMAKER INSERTION (CRT-P)  05/2013   MDT CRTP implanted by Dr Ladona Ridgel for cardiomyopathy, LBBB, and syncope   BIV PACEMAKER GENERATOR CHANGEOUT N/A 06/02/2022   Procedure: BIV PACEMAKER GENERATOR CHANGEOUT;  Surgeon: Marinus Maw, MD;  Location: [facility] INVASIVE [facility];  Service: Cardiovascular;  Laterality: N/A;   HYSTEROSCOPY  2005   heavy bleeding   IMPLANTABLE CARDIOVERTER DEFIBRILLATOR IMPLANT N/A  06/02/2013   Procedure: IMPLANTABLE CARDIOVERTER DEFIBRILLATOR IMPLANT;  Surgeon: Marinus Maw, MD;  Location: [facility] CATH LAB;  Service: Cardiovascular;  Laterality: N/A;   SPINAL FUSION  03/2007   ruptured disc  L5  [name]   TONSILLECTOMY AND ADENOIDECTOMY  1966   Family History  Problem Relation Age of Onset   Arthritis Mother    Hyperlipidemia Mother    Cirrhosis Mother    Arthritis Father    Prostate cancer Father    Hyperlipidemia Father    Pulmonary embolism Father 93       blood clots   Alzheimer's disease Father    Diabetes Brother    Prostate cancer Brother    Seizures Son    Epilepsy Son    ADD / ADHD Son    Breast cancer Maternal Aunt    Diabetes Maternal Uncle  Other Maternal Grandmother        Cerebral hemorrhage   Heart disease Maternal Grandfather    Coronary artery disease Paternal Grandfather 53   Social History   Socioeconomic History   Marital status: Widowed    Spouse name: Not on file   Number of children: 2   Years of education: Not on file   Highest education level: Some college, no degree  Occupational History   Occupation: retired  Tobacco Use   Smoking status: Never   Smokeless tobacco: Never  Vaping Use   Vaping status: Never Used  Substance and Sexual Activity   Alcohol use: No   Drug use: No   Sexual activity: Yes  Other Topics Concern   Not on file  Social History Narrative   Not on file   Social Drivers of Health   Financial Resource Strain: Low Risk  (06/03/2023)   Overall Financial Resource Strain (CARDIA)    Difficulty of Paying Living Expenses: Not hard at all  Recent Concern: Financial Resource Strain - Medium Risk (04/27/2023)   Overall Financial Resource Strain (CARDIA)    Difficulty of Paying Living Expenses: Somewhat hard  Food Insecurity: No Food Insecurity (06/03/2023)   Hunger Vital Sign    Worried About Running Out of Food in the Last Year: Never true    Ran Out of Food in the Last Year: Never true   Transportation Needs: No Transportation Needs (06/03/2023)   PRAPARE - Administrator, Civil Service (Medical): No    Lack of Transportation (Non-Medical): No  Physical Activity: Inactive (06/03/2023)   Exercise Vital Sign    Days of Exercise per Week: 0 days    Minutes of Exercise per Session: 0 min  Stress: Stress Concern Present (06/03/2023)   [employer] of Occupational Health - Occupational Stress Questionnaire    Feeling of Stress : To some extent  Social Connections: Moderately Isolated (06/03/2023)   Social Connection and Isolation Panel [NHANES]    Frequency of Communication with Friends and Family: More than three times a week    Frequency of Social Gatherings with Friends and Family: Twice a week    Attends Religious Services: More than 4 times per year    Active Member of [employer] or Organizations: No    Attends Banker Meetings: Never    Marital Status: Widowed    Tobacco Counseling Counseling given: Not Answered   Clinical Intake:  Pre-visit preparation completed: Yes  Pain : No/denies pain     BMI - recorded: 27.98 Nutritional Status: BMI 25 -29 Overweight Nutritional Risks: None Diabetes: Yes CBG done?: No Did pt. bring in CBG monitor from home?: No  How often do you need to have someone help you when you read instructions, pamphlets, or other written materials from your doctor or pharmacy?: 1 - Never  Interpreter Needed?: No  Information entered by :: R. Kenzlei Runions LPN   Activities of Daily Living    06/03/2023    1:07 PM  In your present state of health, do you have any difficulty performing the following activities:  Hearing? 1  Vision? 0  Difficulty concentrating or making decisions? 1  Walking or climbing stairs? 0  Dressing or bathing? 0  Doing errands, shopping? 0  Preparing Food and eating ? N  Using the Toilet? N  In the past six months, have you accidently leaked urine? Y  Do you have problems with loss of bowel  control? Y  Managing your Medications?  N  Managing your Finances? N  Housekeeping or managing your Housekeeping? N    Patient Care Team: Sherlene Shams, MD as PCP - General (Internal Medicine) Marinus Maw, MD as PCP - Electrophysiology (Cardiology) [facility], [facility] (Optometry)  Indicate any recent Medical Services you may have received from other than [facility] providers in the past year (date may be approximate).     Assessment:   This is a routine wellness examination for Gaylia.  Hearing/Vision screen Hearing Screening - Comments:: Some issues Vision Screening - Comments:: glasses   Goals Addressed             This Visit's Progress    Patient Stated       Wants to start back swimming       Depression Screen    06/03/2023    1:30 PM 04/29/2023    9:06 AM 10/27/2022   10:41 AM 07/16/2022    3:51 PM 06/27/2022   10:00 AM 04/07/2022   11:52 AM 03/24/2022    4:01 PM  PHQ 2/9 Scores  PHQ - 2 Score 1 2 2 2 2 1 1   PHQ- 9 Score 5 4 6 4 7 3 3     Fall Risk    06/03/2023    1:12 PM 04/29/2023    9:06 AM 10/27/2022   10:41 AM 07/16/2022    3:51 PM 06/27/2022   10:00 AM  Fall Risk   Falls in the past year? 1 1 0 0 0  Number falls in past yr: 0 0 0 0   Injury with Fall? 1 0 0 0 0  Comment hit your head      Risk for fall due to : History of fall(s);Impaired balance/gait History of fall(s) No Fall Risks No Fall Risks No Fall Risks  Follow up Falls evaluation completed;Falls prevention discussed Falls evaluation completed Falls evaluation completed Falls evaluation completed Falls evaluation completed    MEDICARE RISK AT HOME: Medicare Risk at Home Any stairs in or around the home?: Yes If so, are there any without handrails?: No Home free of loose throw rugs in walkways, pet beds, electrical cords, etc?: Yes Adequate lighting in your home to reduce risk of falls?: Yes Life alert?: No Use of a cane, walker or w/c?: No Grab bars in the bathroom?: Yes Shower chair or  bench in shower?: Yes Elevated toilet seat or a handicapped toilet?: Yes  Cognitive Function:    12/16/2017    9:47 AM 12/15/2016    9:57 AM  MMSE - Mini Mental State Exam  Orientation to time 5 5  Orientation to Place 5 5  Registration 3 3  Attention/ Calculation 5 5  Recall 3 3  Language- name 2 objects 2 2  Language- repeat 1 1  Language- follow 3 step command 3 3  Language- read & follow direction 1 1  Write a sentence 1 1  Copy design 1 1  Total score 30 30        06/03/2023    1:35 PM 04/07/2022   11:58 AM 03/09/2019   10:51 AM  6CIT Screen  What Year? 0 points 0 points 0 points  What month? 0 points 0 points 0 points  What time? 0 points 0 points 0 points  Count back from 20 0 points 0 points 0 points  Months in reverse 0 points 0 points 0 points  Repeat phrase 0 points 0 points 0 points  Total Score 0 points 0 points 0  points    Immunizations Immunization History  Administered Date(s) Administered   Fluad Quad(high Dose 65+) 01/31/2020, 03/24/2022   Fluad Trivalent(High Dose 65+) 04/29/2023   Influenza Split 03/28/2014   Influenza,inj,Quad PF,6+ Mos 02/07/2013, 04/11/2015, 03/06/2016, 03/18/2017, 03/24/2018, 02/04/2019   PFIZER(Purple Top)SARS-COV-2 Vaccination 07/29/2019, 08/23/2019   Pneumococcal Conjugate-13 06/28/2014   Pneumococcal Polysaccharide-23 05/11/2013, 04/04/2020   Tdap 02/08/2008, 03/24/2018    TDAP status: Up to date  Flu Vaccine status: Up to date  Pneumococcal vaccine status: Up to date  Covid-19 vaccine status: Information provided on how to obtain vaccines.   Qualifies for Shingles Vaccine? Yes   Zostavax completed No   Shingrix Completed?: No.    Education has been provided regarding the importance of this vaccine. Patient has been advised to call insurance company to determine out of pocket expense if they have not yet received this vaccine. Advised may also receive vaccine at local pharmacy or Health Dept. Verbalized acceptance  and understanding.  Screening Tests Health Maintenance  Topic Date Due   Zoster Vaccines- Shingrix (1 of 2) Never done   COVID-19 Vaccine (3 - 2024-25 season) 12/21/2022   Medicare Annual Wellness (AWV)  04/08/2023   Diabetic kidney evaluation - Urine ACR  06/27/2023   HEMOGLOBIN A1C  10/07/2023   MAMMOGRAM  10/20/2023   FOOT EXAM  10/27/2023   OPHTHALMOLOGY EXAM  11/03/2023   Diabetic kidney evaluation - eGFR measurement  04/07/2024   Colonoscopy  12/01/2027   DTaP/Tdap/Td (3 - Td or Tdap) 03/24/2028   Pneumonia Vaccine 32+ Years old  Completed   INFLUENZA VACCINE  Completed   DEXA SCAN  Completed   Hepatitis C Screening  Completed   HPV VACCINES  Aged Out    Health Maintenance  Health Maintenance Due  Topic Date Due   Zoster Vaccines- Shingrix (1 of 2) Never done   COVID-19 Vaccine (3 - 2024-25 season) 12/21/2022   Medicare Annual Wellness (AWV)  04/08/2023   Diabetic kidney evaluation - Urine ACR  06/27/2023    Colorectal cancer screening: Type of screening: Colonoscopy. Completed 11/2020. Repeat every 7 years  Mammogram status: Completed 10/2022. Repeat every year  Bone Density status: Completed 07/2020. Results reflect: Bone density results: OSTEOPOROSIS. Repeat every 2 years.  Lung Cancer Screening: (Low Dose CT Chest recommended if Age 68-80 years, 20 pack-year currently smoking OR have quit w/in 15years.) does not qualify.     Additional Screening:  Hepatitis C Screening: does qualify; Completed 05/2016  Vision Screening: Recommended annual ophthalmology exams for early detection of glaucoma and other disorders of the eye. Is the patient up to date with their annual eye exam?  Yes  Who is the provider or what is the name of the office in which the patient attends annual eye exams?  Eye If pt is not established with a provider, would they like to be referred to a provider to establish care? No .   Dental Screening: Recommended annual dental exams for  proper oral hygiene  Diabetic Foot Exam: Diabetic Foot Exam: Completed 10/2022  Community Resource Referral / Chronic Care Management: CRR required this visit?  No   CCM required this visit?  No     Plan:     I have personally reviewed and noted the following in the patient's chart:   Medical and social history Use of alcohol, tobacco or illicit drugs  Current medications and supplements including opioid prescriptions. Patient is not currently taking opioid prescriptions. Functional ability and status Nutritional status Physical activity  Advanced directives List of other physicians Hospitalizations, surgeries, and ER visits in previous 12 months Vitals Screenings to include cognitive, depression, and falls Referrals and appointments  In addition, I have reviewed and discussed with patient certain preventive protocols, quality metrics, and best practice recommendations. A written personalized care plan for preventive services as well as general preventive health recommendations were provided to patient.     Sydell Axon, LPN   07/28/8117   After Visit Summary: (MyChart) Due to this being a telephonic visit, the after visit summary with patients personalized plan was offered to patient via MyChart   Nurse Notes: None

## 2023-06-04 LAB — CUP PACEART REMOTE DEVICE CHECK
Battery Remaining Longevity: 98 mo
Battery Voltage: 3.02 V
Brady Statistic AP VP Percent: 0.33 %
Brady Statistic AP VS Percent: 0.01 %
Brady Statistic AS VP Percent: 99.66 %
Brady Statistic AS VS Percent: 0 %
Brady Statistic RA Percent Paced: 0.33 %
Brady Statistic RV Percent Paced: 99.98 %
Date Time Interrogation Session: 20250211203123
Implantable Lead Connection Status: 753985
Implantable Lead Connection Status: 753985
Implantable Lead Connection Status: 753985
Implantable Lead Implant Date: 20150212
Implantable Lead Implant Date: 20150212
Implantable Lead Implant Date: 20150212
Implantable Lead Location: 753858
Implantable Lead Location: 753859
Implantable Lead Location: 753860
Implantable Lead Model: 4194
Implantable Lead Model: 5076
Implantable Lead Model: 5076
Implantable Pulse Generator Implant Date: 20240212
Lead Channel Impedance Value: 285 Ohm
Lead Channel Impedance Value: 361 Ohm
Lead Channel Impedance Value: 380 Ohm
Lead Channel Impedance Value: 380 Ohm
Lead Channel Impedance Value: 437 Ohm
Lead Channel Impedance Value: 494 Ohm
Lead Channel Impedance Value: 551 Ohm
Lead Channel Impedance Value: 551 Ohm
Lead Channel Impedance Value: 646 Ohm
Lead Channel Pacing Threshold Amplitude: 0.625 V
Lead Channel Pacing Threshold Amplitude: 1.125 V
Lead Channel Pacing Threshold Amplitude: 1.25 V
Lead Channel Pacing Threshold Pulse Width: 0.4 ms
Lead Channel Pacing Threshold Pulse Width: 0.4 ms
Lead Channel Pacing Threshold Pulse Width: 0.8 ms
Lead Channel Sensing Intrinsic Amplitude: 1.25 mV
Lead Channel Sensing Intrinsic Amplitude: 1.25 mV
Lead Channel Sensing Intrinsic Amplitude: 19 mV
Lead Channel Sensing Intrinsic Amplitude: 19 mV
Lead Channel Setting Pacing Amplitude: 1.5 V
Lead Channel Setting Pacing Amplitude: 2 V
Lead Channel Setting Pacing Amplitude: 2.5 V
Lead Channel Setting Pacing Pulse Width: 0.4 ms
Lead Channel Setting Pacing Pulse Width: 0.8 ms
Lead Channel Setting Sensing Sensitivity: 0.9 mV
Zone Setting Status: 755011

## 2023-06-07 ENCOUNTER — Encounter: Payer: Self-pay | Admitting: Internal Medicine

## 2023-07-08 NOTE — Progress Notes (Signed)
 Remote pacemaker transmission.

## 2023-07-08 NOTE — Addendum Note (Signed)
 Addended by: Elease Etienne A on: 07/08/2023 04:44 PM   Modules accepted: Orders

## 2023-08-11 ENCOUNTER — Other Ambulatory Visit: Payer: Self-pay | Admitting: Internal Medicine

## 2023-09-02 ENCOUNTER — Ambulatory Visit (INDEPENDENT_AMBULATORY_CARE_PROVIDER_SITE_OTHER): Payer: PPO

## 2023-09-02 DIAGNOSIS — I428 Other cardiomyopathies: Secondary | ICD-10-CM

## 2023-09-02 LAB — CUP PACEART REMOTE DEVICE CHECK
Battery Remaining Longevity: 98 mo
Battery Voltage: 3.01 V
Brady Statistic AP VP Percent: 0.26 %
Brady Statistic AP VS Percent: 0.01 %
Brady Statistic AS VP Percent: 99.73 %
Brady Statistic AS VS Percent: 0 %
Brady Statistic RA Percent Paced: 0.26 %
Brady Statistic RV Percent Paced: 99.99 %
Date Time Interrogation Session: 20250514075552
Implantable Lead Connection Status: 753985
Implantable Lead Connection Status: 753985
Implantable Lead Connection Status: 753985
Implantable Lead Implant Date: 20150212
Implantable Lead Implant Date: 20150212
Implantable Lead Implant Date: 20150212
Implantable Lead Location: 753858
Implantable Lead Location: 753859
Implantable Lead Location: 753860
Implantable Lead Model: 4194
Implantable Lead Model: 5076
Implantable Lead Model: 5076
Implantable Pulse Generator Implant Date: 20240212
Lead Channel Impedance Value: 361 Ohm
Lead Channel Impedance Value: 380 Ohm
Lead Channel Impedance Value: 399 Ohm
Lead Channel Impedance Value: 418 Ohm
Lead Channel Impedance Value: 456 Ohm
Lead Channel Impedance Value: 570 Ohm
Lead Channel Impedance Value: 570 Ohm
Lead Channel Impedance Value: 608 Ohm
Lead Channel Impedance Value: 684 Ohm
Lead Channel Pacing Threshold Amplitude: 0.625 V
Lead Channel Pacing Threshold Amplitude: 1.125 V
Lead Channel Pacing Threshold Amplitude: 1.125 V
Lead Channel Pacing Threshold Pulse Width: 0.4 ms
Lead Channel Pacing Threshold Pulse Width: 0.4 ms
Lead Channel Pacing Threshold Pulse Width: 0.8 ms
Lead Channel Sensing Intrinsic Amplitude: 19 mV
Lead Channel Sensing Intrinsic Amplitude: 19 mV
Lead Channel Sensing Intrinsic Amplitude: 2.375 mV
Lead Channel Sensing Intrinsic Amplitude: 2.375 mV
Lead Channel Setting Pacing Amplitude: 1.5 V
Lead Channel Setting Pacing Amplitude: 2 V
Lead Channel Setting Pacing Amplitude: 2.5 V
Lead Channel Setting Pacing Pulse Width: 0.4 ms
Lead Channel Setting Pacing Pulse Width: 0.8 ms
Lead Channel Setting Sensing Sensitivity: 0.9 mV
Zone Setting Status: 755011

## 2023-09-07 ENCOUNTER — Ambulatory Visit: Payer: Self-pay | Admitting: Internal Medicine

## 2023-10-13 NOTE — Progress Notes (Signed)
 Remote pacemaker transmission.

## 2023-10-17 ENCOUNTER — Other Ambulatory Visit: Payer: Self-pay | Admitting: Internal Medicine

## 2023-10-17 DIAGNOSIS — E1169 Type 2 diabetes mellitus with other specified complication: Secondary | ICD-10-CM

## 2023-10-21 ENCOUNTER — Other Ambulatory Visit: Payer: Self-pay

## 2023-10-21 DIAGNOSIS — E1169 Type 2 diabetes mellitus with other specified complication: Secondary | ICD-10-CM

## 2023-10-26 DIAGNOSIS — I10 Essential (primary) hypertension: Secondary | ICD-10-CM | POA: Diagnosis not present

## 2023-10-26 DIAGNOSIS — I428 Other cardiomyopathies: Secondary | ICD-10-CM | POA: Diagnosis not present

## 2023-10-26 DIAGNOSIS — E7800 Pure hypercholesterolemia, unspecified: Secondary | ICD-10-CM | POA: Diagnosis not present

## 2023-10-26 DIAGNOSIS — E781 Pure hyperglyceridemia: Secondary | ICD-10-CM | POA: Diagnosis not present

## 2023-10-26 DIAGNOSIS — Z950 Presence of cardiac pacemaker: Secondary | ICD-10-CM | POA: Diagnosis not present

## 2023-10-26 DIAGNOSIS — I5022 Chronic systolic (congestive) heart failure: Secondary | ICD-10-CM | POA: Diagnosis not present

## 2023-10-26 NOTE — Progress Notes (Signed)
 Cardiology Follow Up Patient Visit  PRIMARY CARE PROVIDER:   Marylynn Verneita Kay, MD [address] [place] [address]  Julia, Pierce 10/26/2023 DOB: 04-05-1956 Age: 68 y.o. [facility]    Chief Complaint  Patient presents with  . Congestive Heart Failure    History of Present Illness  Julia Pierce is a 68 y.o. female here for f/u of HFrEF 2/2 NICM. Has a h/o CRT device in place; s/p gen change 06/02/22. Also has a h/o HTN, hyperTG. Last saw me about six mos ago. Today states she has been feeling well. No CP. No SOB. No PND or orthopnea. No syncope. Last device report from [facility] reviewed by me. Date 09/02/23. Normal function. As-biV paced rhythm. One 18 beat run of NSVT. Normal HF diagnostics.   Current Medications and Allergies   Allergies  Allergen Reactions  . Keflex [Cephalexin] Hives, Shortness Of Breath and Swelling  . Penicillin G Hives, Shortness Of Breath and Swelling  . Semaglutide  Diarrhea  . Statins-Hmg-Coa Reductase Inhibitors Muscle Pain  . Atorvastatin  Muscle Pain    myalgias  . Duloxetine  Muscle Pain  . Fenofibrate Muscle Pain    Muscle pain   . Icosapent  Ethyl Other (See Comments)    dizziness  . Ondansetron  Other (See Comments)    Prolong QT    Current Outpatient Medications  Medication Sig Dispense Refill  . amLODIPine  (NORVASC ) 2.5 MG tablet TAKE 1 TABLET BY MOUTH EVERY DAY 90 tablet 3  . buPROPion  (WELLBUTRIN  XL) 150 MG XL tablet Take 1 tablet by mouth once daily    . calcium  carbonate (TUMS ULTRA) 400 mg (1,000 mg) chewable tablet Take 400 mg of elemental by mouth as needed.      . carvediloL  (COREG ) 25 MG tablet take 1 tablet by mouth 2 times daily 180 tablet 3  . colchicine  (COLCRYS ) 0.6 mg tablet Take by mouth continuously as needed (Gout Flare Up)    . diphenoxylate -atropine  (LOMOTIL ) 2.5-0.025 mg tablet Take 1 tablet by mouth 4 (four) times daily as needed for Diarrhea    . esomeprazole  (NEXIUM ) 20 MG DR capsule Take 20 mg by  mouth once daily    . FINACEA 15 % topical gel 2 (two) times daily.  4  . fluticasone (FLONASE) 50 mcg/actuation nasal spray Place 2 sprays into both nostrils once daily.    . hydroCHLOROthiazide  (HYDRODIURIL ) 12.5 MG tablet Take 12.5 mg by mouth every morning    . losartan  (COZAAR ) 100 MG tablet TAKE 1 TABLET BY MOUTH EVERY DAY 90 tablet 3  . multivitamin tablet Take 1 tablet by mouth once daily.    . sertraline  (ZOLOFT ) 100 MG tablet Take 100 mg by mouth once daily       . tolterodine  (DETROL ) 2 MG tablet Take 2 mg by mouth 2 (two) times daily.  5  . triamcinolone  0.1 % cream Apply 1 Application topically 2 (two) times daily    . vitamin B complex (B COMPLEX 1 ORAL) Take 1 tablet by mouth once daily    . esomeprazole  magnesium  20 mg TbEC Take 20 mg by mouth once daily    (Patient not taking: Reported on 10/26/2023)     No current facility-administered medications for this visit.    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 Anxiety 2010   sometimes  . Cardiomyopathy, secondary (CMS/HHS-HCC)   . Depression 2011   sometimes  . Diabetes mellitus without complication (CMS/HHS-HCC) 2007   controlled with diet  .  GERD (gastroesophageal reflux disease) 2013   taking Nexium , 2 per day  . Hypertension   . Hypertriglyceridemia   . Obesity 1990  . Pacemaker    syncope  . Syncope     Social History: See HPI above. Additionally: Social History   Socioeconomic History  . Marital status: Widowed  Tobacco Use  . Smoking status: Never  . Smokeless tobacco: Never  . Tobacco comments:    Grew up in a home that both parents smoked  Substance and Sexual Activity  . Alcohol use: Not Currently    Alcohol/week: 0.0 standard drinks of alcohol  . Drug use: No  . Sexual activity: Not Currently    Partners: Male    Birth control/protection: Post-menopausal    Comment: Partial hysterectomy   Social Drivers of Health   Financial Resource Strain: Low Risk  (10/23/2023)   Received from [facility]   Overall Financial Resource Strain (CARDIA)   . How hard is it for you to pay for the very basics like food, housing, medical care, and heating?: Not hard at all  Food Insecurity: No Food Insecurity (10/23/2023)   Received from [facility]   Hunger Vital Sign   . Within the past 12 months, you worried that your food would run out before you got the money to buy more.: Never true   . Within the past 12 months, the food you bought just didn't last and you didn't have money to get more.: Never true  Transportation Needs: No Transportation Needs (10/23/2023)   Received from [facility] - Transportation   . In the past 12 months, has lack of transportation kept you from medical appointments or from getting medications?: No   . In the past 12 months, has lack of transportation kept you from meetings, work, or from getting things needed for daily living?: No  Physical Activity: Inactive (10/23/2023)   Received from [facility]   Exercise Vital Sign   . On average, how many days per week do you engage in moderate to strenuous exercise (like a brisk walk)?: 0 days  Stress: Stress Concern Present (10/23/2023)    Received from [facility] - Occupational Stress Questionnaire   . Do you feel stress - tense, restless, nervous, or anxious, or unable to sleep at night because your mind is troubled all the time - these days?: To some extent  Social Connections: Socially Isolated (10/23/2023)   Received from [facility]   Social Connection and Isolation Panel   . In a typical week, how many  times do you talk on the phone with family, friends, or neighbors?: Twice a week   . How often do you get together with friends or relatives?: Never   . How often do you attend church or religious services?: Patient declined   . Do you belong to any clubs or organizations such as church groups, unions, fraternal or athletic groups, or school groups?: No   . Are you married, widowed, divorced, separated, never married, or living with a partner?: Widowed  Housing Stability: Unknown (10/23/2023)   Housing Stability Vital Sign   . Homeless in the Last Year: No    Family History: See HPI above. Additionally: Family History  Problem Relation Name Age of Onset  . Hyperlipidemia (Elevated cholesterol) Mother Kenneth Baseman        Extremely high Cholesterol and Triglycerides  . Diabetes type II Mother Kenneth Baseman   . Intolerance to statin medications Mother Kenneth Baseman   . Hyperlipidemia (Elevated cholesterol) Father Vicenta Baseman        moderately high Cholesterol  . Intolerance to statin medications Father Vicenta Baseman   . Myocardial Infarction (Heart attack) Other         grandfather and uncle  . Myocardial Infarction (Heart attack) Maternal Uncle Eveline Dolly        deceased  . Sudden death (unexpected death due to unknown cause) Maternal Uncle Eveline Dolly        sudden heart attack a few months after surgery  . High blood pressure (Hypertension) Maternal Uncle Eveline Dolly        extreme hypertension  . Myocardial Infarction (Heart attack) Maternal Grandfather Ray Dolly         deceased  . Myocardial Infarction (Heart attack) Paternal Grandfather Lauralee Baseman        deceased  . Sudden death (unexpected death due to unknown cause) Paternal Grandfather Lauralee Baseman        massive heart attack  . Cardiomyopathy (Abnormal function of the heart muscle) Brother Ubaldo Baseman        Pacemaker 2018  . Heart disease Brother Ubaldo Baseman   . Intolerance to statin medications Brother Ubaldo Baseman     Review of Systems: See HPI above. Additionally: Constitutional: no fevers or chills Eyes: no diplopia ENT: no epistaxis Cardiovascular: See HPI above Respiratory: See HPI above Gastrointestinal: no BRBPR Genitourinary: no hematuria Musculoskeletal: no joint swelling Skin: no rash Neurological: no strokelike sx Psychiatric: no SI Endocrine: no night sweats Hematologic/Lymphatic: no bleeding Allergic/Immunologic: See allergies above   Physical Exam   Vitals:   10/26/23 1103  BP: 132/88  Pulse: 71  Resp: 13   Body mass index is 29.67 kg/m.  Wt Readings from Last 3 Encounters:  10/26/23 73.6 kg (162 lb 3.2 oz)  04/06/23 70.4 kg (155 lb 3.2 oz)  10/09/22 70 kg (154 lb 6.4 oz)   BP Readings from Last 3 Encounters:  10/26/23 132/88  04/06/23 (!) 140/72  10/09/22 118/66   Pulse Readings from Last 3 Encounters:  10/26/23 71  04/06/23 68  10/09/22 72     Gen: NAD Neck: No JVD CV: nl s1s2, RRR, no significant murmur Resp: CTAB, nl effort GI: abd soft, NT Skin: warm Edema: no LE edema MSK: no obvious deformity Psych: nl affect Neuro: awake and alert Endo: no thyromegaly Lymph: no cervical LAD   Data/Results   No results for input(s): CHOLTOTAL, HDL, LDLCALC, LDLDIRECT, LDL, VLDL, TRIG in the last 73719 hours.  No results for  input(s): NA, K, BUN, CREATININE, CO2, GLUCOSE, GLUF, ALT, AST, TBILI, ALB in the last 73719 hours.  No results for input(s): WBC, HGB, HCT, MCV, PLT in the last 73719 hours.  No results  for input(s): INR, TSH, HGBA1C, HBA1C, PROBNP, BNP, HSTNI, DELTAHSTNI, HSTNIINTERP, TROPONINT, CK, CKMB, DDIMER in the last 73719 hours.   ECG: I personally interpreted tracing from 10/26/23 - AsVp rhythm  TTE: I reviewed and personally interpreted the images from study 09/2020 NORMAL LEFT VENTRICULAR SYSTOLIC FUNCTION NORMAL RIGHT VENTRICULAR SYSTOLIC FUNCTION NO VALVULAR STENOSIS TRIVIAL MR, PR MILD TR EF 55%  TTE 10/29/22: NORMAL LEFT VENTRICULAR SYSTOLIC FUNCTION   WITH MILD LVH NORMAL RIGHT VENTRICULAR SYSTOLIC FUNCTION MILD VALVULAR REGURGITATION (See above) NO VALVULAR STENOSIS MILD TR, PR EF 55%    Assessment    68 y.o. F with   -chronic HFrEF, now w/ recovered LV fxn, 2/2 NICM -s/p CRT-P  -HTN -elevated LDL-C -hyperTG  Plan  -obtain echo  -continue coreg , losartan  -mediterranean diet, aerobic exercise  -f/u six mos  Attestation Statement:   I personally performed the service. (TP)  DEARL LEAVEN, MD   DEARL LEAVEN MD [facility] Assistant Professor of Medicine, Division of Cardiology [facility]

## 2023-10-28 DIAGNOSIS — Z1231 Encounter for screening mammogram for malignant neoplasm of breast: Secondary | ICD-10-CM | POA: Diagnosis not present

## 2023-10-28 LAB — HM MAMMOGRAPHY

## 2023-10-29 ENCOUNTER — Encounter: Payer: Self-pay | Admitting: Obstetrics

## 2023-10-30 ENCOUNTER — Ambulatory Visit (INDEPENDENT_AMBULATORY_CARE_PROVIDER_SITE_OTHER): Payer: PPO | Admitting: Internal Medicine

## 2023-10-30 ENCOUNTER — Encounter: Payer: Self-pay | Admitting: Internal Medicine

## 2023-10-30 VITALS — BP 124/72 | HR 70 | Temp 97.9°F | Resp 20 | Ht 62.0 in | Wt 164.0 lb

## 2023-10-30 DIAGNOSIS — F320 Major depressive disorder, single episode, mild: Secondary | ICD-10-CM

## 2023-10-30 DIAGNOSIS — Z0000 Encounter for general adult medical examination without abnormal findings: Secondary | ICD-10-CM

## 2023-10-30 DIAGNOSIS — Z634 Disappearance and death of family member: Secondary | ICD-10-CM | POA: Diagnosis not present

## 2023-10-30 DIAGNOSIS — E781 Pure hyperglyceridemia: Secondary | ICD-10-CM

## 2023-10-30 DIAGNOSIS — E1169 Type 2 diabetes mellitus with other specified complication: Secondary | ICD-10-CM | POA: Diagnosis not present

## 2023-10-30 DIAGNOSIS — L659 Nonscarring hair loss, unspecified: Secondary | ICD-10-CM | POA: Diagnosis not present

## 2023-10-30 LAB — HEPATIC FUNCTION PANEL
ALT: 14 U/L (ref 0–35)
AST: 17 U/L (ref 0–37)
Albumin: 4.4 g/dL (ref 3.5–5.2)
Alkaline Phosphatase: 95 U/L (ref 39–117)
Bilirubin, Direct: 0 mg/dL (ref 0.0–0.3)
Total Bilirubin: 0.4 mg/dL (ref 0.2–1.2)
Total Protein: 7.2 g/dL (ref 6.0–8.3)

## 2023-10-30 LAB — MICROALBUMIN / CREATININE URINE RATIO
Creatinine,U: 86.9 mg/dL
Microalb Creat Ratio: 17.4 mg/g (ref 0.0–30.0)
Microalb, Ur: 1.5 mg/dL (ref 0.0–1.9)

## 2023-10-30 LAB — LIPID PANEL
Cholesterol: 228 mg/dL — ABNORMAL HIGH (ref 0–200)
HDL: 41 mg/dL (ref >39.00)
LDL Cholesterol: 121 mg/dL — ABNORMAL HIGH (ref 0–99)
NonHDL: 186.67
Total CHOL/HDL Ratio: 6
Triglycerides: 327 mg/dL — ABNORMAL HIGH (ref 0.0–149.0)
VLDL: 65.4 mg/dL — ABNORMAL HIGH (ref 0.0–40.0)

## 2023-10-30 LAB — CBC WITH DIFFERENTIAL/PLATELET
Basophils Absolute: 0.1 K/uL (ref 0.0–0.1)
Basophils Relative: 0.8 % (ref 0.0–3.0)
Eosinophils Absolute: 0.2 K/uL (ref 0.0–0.7)
Eosinophils Relative: 2.6 % (ref 0.0–5.0)
HCT: 37.6 % (ref 36.0–46.0)
Hemoglobin: 12.7 g/dL (ref 12.0–15.0)
Lymphocytes Relative: 21.1 % (ref 12.0–46.0)
Lymphs Abs: 1.4 K/uL (ref 0.7–4.0)
MCHC: 33.6 g/dL (ref 30.0–36.0)
MCV: 83 fl (ref 78.0–100.0)
Monocytes Absolute: 0.5 K/uL (ref 0.1–1.0)
Monocytes Relative: 6.9 % (ref 3.0–12.0)
Neutro Abs: 4.6 K/uL (ref 1.4–7.7)
Neutrophils Relative %: 68.6 % (ref 43.0–77.0)
Platelets: 182 K/uL (ref 150.0–400.0)
RBC: 4.54 Mil/uL (ref 3.87–5.11)
RDW: 14 % (ref 11.5–15.5)
WBC: 6.7 K/uL (ref 4.0–10.5)

## 2023-10-30 LAB — HEMOGLOBIN A1C: Hgb A1c MFr Bld: 6.6 % — ABNORMAL HIGH (ref 4.6–6.5)

## 2023-10-30 LAB — BASIC METABOLIC PANEL WITH GFR
BUN: 21 mg/dL (ref 6–23)
CO2: 29 meq/L (ref 19–32)
Calcium: 9.5 mg/dL (ref 8.4–10.5)
Chloride: 103 meq/L (ref 96–112)
Creatinine, Ser: 0.68 mg/dL (ref 0.40–1.20)
GFR: 88.96 mL/min (ref >60.00)
Glucose, Bld: 132 mg/dL — ABNORMAL HIGH (ref 70–99)
Potassium: 4.2 meq/L (ref 3.5–5.1)
Sodium: 139 meq/L (ref 135–145)

## 2023-10-30 LAB — TSH: TSH: 3.46 u[IU]/mL (ref 0.35–5.50)

## 2023-10-30 NOTE — Progress Notes (Unsigned)
 Patient ID: Julia Pierce, female    DOB: 09-06-1955  Age: 68 y.o. MRN: [id number]  The patient is here for annual preventive examination and management of other chronic and acute problems.   The risk factors are reflected in the social history.   The roster of all physicians providing medical care to patient - is listed in the Snapshot section of the chart.   Activities of daily living:  The patient is 100% independent in all ADLs: dressing, toileting, feeding as well as independent mobility   Home safety : The patient has smoke detectors in the home. They wear seatbelts.  There are no unsecured firearms at home. There is no violence in the home.    There is no risks for hepatitis, STDs or HIV. There is no   history of blood transfusion. They have no travel history to infectious disease endemic areas of the world.   The patient has seen their dentist in the last six month. They have seen their eye doctor in the last year. The patinet  denies slight hearing difficulty with regard to whispered voices and some television programs.  They have deferred audiologic testing in the last year.  They do not  have excessive sun exposure. Discussed the need for sun protection: hats, long sleeves and use of sunscreen if there is significant sun exposure.    Diet: the importance of a healthy diet is discussed. They do have a healthy diet.   The benefits of regular aerobic exercise were discussed. The patient  exercises  3 to 5 days per week  for  60 minutes.    Depression screen: there are no signs or vegative symptoms of depression- irritability, change in appetite, anhedonia, sadness/tearfullness.   The following portions of the patient's history were reviewed and updated as appropriate: allergies, current medications, past family history, past medical history,  past surgical history, past social history  and problem list.   Visual acuity was not assessed per patient preference since the patient has regular  follow up with an  ophthalmologist. Hearing and body mass index were assessed and reviewed.    During the course of the visit the patient was educated and counseled about appropriate screening and preventive services including : fall prevention , diabetes screening, nutrition counseling, colorectal cancer screening, and recommended immunizations.    Chief Complaint:   RECENTLY WIDOWED  in November 2024 after a CVA  Her older Son in [place]  is a physician and has been relying on her to provide daycare ,  but takes trips with family and doesn't let her know she is going     HM:  Colonoscopy due in 2029 and mammogram normal July 9 [facility].  Benign mass in right breast, stable for  3 years  Recent cough in April , after working outside in the pollen . Mostly resolved.      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  Eating differently since husband died Thinks the Frosted Mini Wheats are helping normalize her stools.    Type 2 DM:    She  feels generally well,  But is not  exercising regularly or trying to lose weight. Checking  blood sugars less than once daily at variable times, usually only if she feels she may be having a hypoglycemic event. .  BS have been ranging from 120 to 130 fasting and < 160 post prandially.  Denies any recent hypoglyemic events.  Taking   medications as directed. Following a carbohydrate modified diet 6 days per week. Denies numbness, burning and tingling of extremities. Appetite is good.  Admits to 2 weeks of indulging in chocolate   HTN:  Hypertension: patient checks blood pressure twice weekly at home.  Readings have been for the most part <130/80 at rest . Patient is following a reduced salt diet most days and is taking medications as prescribed      Review of Symptoms  Patient denies headache, fevers, malaise, unintentional weight loss, skin rash, eye pain, sinus  congestion and sinus pain, sore throat, dysphagia,  hemoptysis , cough, dyspnea, wheezing, chest pain, palpitations, orthopnea, edema, abdominal pain, nausea, melena, diarrhea, constipation, flank pain, dysuria, hematuria, urinary  Frequency, nocturia, numbness, tingling, seizures,  Focal weakness, Loss of consciousness,  Tremor, insomnia, depression, anxiety, and suicidal ideation.    Physical Exam:  BP 124/72   Pulse 70   Temp 97.9 F (36.6 C)   Resp 20   Ht 5' 2 (1.575 m)   Wt 164 lb (74.4 kg)   SpO2 99%   BMI 30.00 kg/m    Physical Exam Vitals reviewed.  Constitutional:      General: She is not in acute distress.    Appearance: Normal appearance. She is normal weight. She is not ill-appearing, toxic-appearing or diaphoretic.  HENT:     Head: Normocephalic.  Eyes:     General: No scleral icterus.       Right eye: No discharge.        Left eye: No discharge.     Conjunctiva/sclera: Conjunctivae normal.  Cardiovascular:     Rate and Rhythm: Normal rate and regular rhythm.     Heart sounds: Normal heart sounds.  Pulmonary:     Effort: Pulmonary effort is normal. No respiratory distress.     Breath sounds: Normal breath sounds.  Musculoskeletal:        General: Normal range of motion.  Skin:    General: Skin is warm and dry.  Neurological:     General: No focal deficit present.     Mental Status: She is alert and oriented to person, place, and time. Mental status is at baseline.  Psychiatric:        Mood and Affect: Mood normal.        Behavior: Behavior normal.        Thought Content: Thought content normal.        Judgment: Judgment normal.     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 But has stopped exercising since her husbanf passed in Nov 2024.  SABRA However  triglycerides are l hgherthan previously  . Patient is reminded to schedule an  annual eye exam and foot exam is up to date . Patient has no microalbuminuria. Patient is tolerating ACE/ARB for renal protection and hypertension .  She has deferred jardiance  and CBG. Monitor.  She is  fenofibrate and statin intolerant.  Will recommend exercise and if no improvement,  a trial of Vascepa    Lab Results  Component Value Date   HGBA1C 6.6 (H) 10/30/2023   Lab Results  Component Value Date   LABMICR 9.4 06/27/2022   MICROALBUR 1.5 10/30/2023   Lab Results  Component Value Date   CHOL 228 (H) 10/30/2023   HDL 41.00 10/30/2023   LDLCALC 121 (H) 10/30/2023   LDLDIRECT  149.0 04/08/2023   TRIG 327.0 (H) 10/30/2023   CHOLHDL 6 10/30/2023      Orders: -     Microalbumin / creatinine urine ratio -     Hemoglobin A1c; Future -     CBC with Differential/Platelet -     Lipid panel -     Hepatic function panel -     Basic metabolic panel with GFR  Thinning hair -     TSH; Future  Depression, major, single episode, mild (HCC) Assessment & Plan: Symptoms under control with wellbutrin  and sertraline . Since her husband died in 03-29-24,  the additional stressor of marital  has resolved,    No changes to regimen today    Encounter for preventive health examination Assessment & Plan: age appropriate education and counseling updated, referrals for preventative services and immunizations addressed, dietary and smoking counseling addressed, most recent labs reviewed.  I have personally reviewed and have noted:   1) the patient's medical and social history 2) The pt's use of alcohol, tobacco, and illicit drugs 3) The patient's current medications and supplements 4) Functional ability including ADL's, fall risk, home safety risk, hearing and visual impairment 5) Diet and physical activities 6) Evidence for depression or mood disorder 7) The patient's height, weight, and BMI have been recorded in the chart   I have made referrals, and provided counseling and education based  on review of the above      Return in about 6 months (around 05/01/2024) for follow up diabetes.  Verneita LITTIE Kettering, MD

## 2023-11-01 ENCOUNTER — Ambulatory Visit: Payer: Self-pay | Admitting: Internal Medicine

## 2023-11-01 DIAGNOSIS — Z0000 Encounter for general adult medical examination without abnormal findings: Secondary | ICD-10-CM | POA: Insufficient documentation

## 2023-11-01 NOTE — Assessment & Plan Note (Signed)
 She has maintained control without anti hyperglycemics. But has stopped exercising since her husbanf passed in Nov 2024.  SABRA However  triglycerides are l hgherthan previously  . Patient is reminded to schedule an annual eye exam and foot exam is up to date . Patient has no microalbuminuria. Patient is tolerating ACE/ARB for renal protection and hypertension .  She has deferred jardiance  and CBG. Monitor.  She is  fenofibrate and statin intolerant.  Will recommend exercise and if no improvement,  a trial of Vascepa    Lab Results  Component Value Date   HGBA1C 6.6 (H) 10/30/2023   Lab Results  Component Value Date   LABMICR 9.4 06/27/2022   MICROALBUR 1.5 10/30/2023   Lab Results  Component Value Date   CHOL 228 (H) 10/30/2023   HDL 41.00 10/30/2023   LDLCALC 121 (H) 10/30/2023   LDLDIRECT 149.0 04/08/2023   TRIG 327.0 (H) 10/30/2023   CHOLHDL 6 10/30/2023

## 2023-11-01 NOTE — Assessment & Plan Note (Signed)

## 2023-11-01 NOTE — Assessment & Plan Note (Signed)
 Symptoms under control with wellbutrin  and sertraline . Since her husband died in 03/25/24,  the additional stressor of marital  has resolved,    No changes to regimen today

## 2023-11-10 DIAGNOSIS — I428 Other cardiomyopathies: Secondary | ICD-10-CM | POA: Diagnosis not present

## 2023-11-10 DIAGNOSIS — Z950 Presence of cardiac pacemaker: Secondary | ICD-10-CM | POA: Diagnosis not present

## 2023-11-10 DIAGNOSIS — I5022 Chronic systolic (congestive) heart failure: Secondary | ICD-10-CM | POA: Diagnosis not present

## 2023-11-11 ENCOUNTER — Encounter: Payer: Self-pay | Admitting: Internal Medicine

## 2023-11-23 DIAGNOSIS — L718 Other rosacea: Secondary | ICD-10-CM | POA: Diagnosis not present

## 2023-11-23 DIAGNOSIS — L578 Other skin changes due to chronic exposure to nonionizing radiation: Secondary | ICD-10-CM | POA: Diagnosis not present

## 2023-11-23 DIAGNOSIS — L720 Epidermal cyst: Secondary | ICD-10-CM | POA: Diagnosis not present

## 2023-11-23 DIAGNOSIS — Z872 Personal history of diseases of the skin and subcutaneous tissue: Secondary | ICD-10-CM | POA: Diagnosis not present

## 2023-11-23 DIAGNOSIS — Z86018 Personal history of other benign neoplasm: Secondary | ICD-10-CM | POA: Diagnosis not present

## 2023-11-23 DIAGNOSIS — L570 Actinic keratosis: Secondary | ICD-10-CM | POA: Diagnosis not present

## 2023-12-02 ENCOUNTER — Ambulatory Visit: Payer: PPO

## 2023-12-02 DIAGNOSIS — I428 Other cardiomyopathies: Secondary | ICD-10-CM | POA: Diagnosis not present

## 2023-12-03 ENCOUNTER — Ambulatory Visit: Payer: Self-pay | Admitting: Internal Medicine

## 2023-12-03 LAB — CUP PACEART REMOTE DEVICE CHECK
Battery Remaining Longevity: 92 mo
Battery Voltage: 3.01 V
Brady Statistic AP VP Percent: 0.06 %
Brady Statistic AP VS Percent: 0.01 %
Brady Statistic AS VP Percent: 99.93 %
Brady Statistic AS VS Percent: 0 %
Brady Statistic RA Percent Paced: 0.07 %
Brady Statistic RV Percent Paced: 99.99 %
Date Time Interrogation Session: 20250812224559
Implantable Lead Connection Status: 753985
Implantable Lead Connection Status: 753985
Implantable Lead Connection Status: 753985
Implantable Lead Implant Date: 20150212
Implantable Lead Implant Date: 20150212
Implantable Lead Implant Date: 20150212
Implantable Lead Location: 753858
Implantable Lead Location: 753859
Implantable Lead Location: 753860
Implantable Lead Model: 4194
Implantable Lead Model: 5076
Implantable Lead Model: 5076
Implantable Pulse Generator Implant Date: 20240212
Lead Channel Impedance Value: 285 Ohm
Lead Channel Impedance Value: 380 Ohm
Lead Channel Impedance Value: 380 Ohm
Lead Channel Impedance Value: 399 Ohm
Lead Channel Impedance Value: 437 Ohm
Lead Channel Impedance Value: 513 Ohm
Lead Channel Impedance Value: 551 Ohm
Lead Channel Impedance Value: 551 Ohm
Lead Channel Impedance Value: 646 Ohm
Lead Channel Pacing Threshold Amplitude: 0.625 V
Lead Channel Pacing Threshold Amplitude: 1.125 V
Lead Channel Pacing Threshold Amplitude: 1.125 V
Lead Channel Pacing Threshold Pulse Width: 0.4 ms
Lead Channel Pacing Threshold Pulse Width: 0.4 ms
Lead Channel Pacing Threshold Pulse Width: 0.8 ms
Lead Channel Sensing Intrinsic Amplitude: 19 mV
Lead Channel Sensing Intrinsic Amplitude: 19 mV
Lead Channel Sensing Intrinsic Amplitude: 2.5 mV
Lead Channel Sensing Intrinsic Amplitude: 2.5 mV
Lead Channel Setting Pacing Amplitude: 1.5 V
Lead Channel Setting Pacing Amplitude: 2 V
Lead Channel Setting Pacing Amplitude: 2.5 V
Lead Channel Setting Pacing Pulse Width: 0.4 ms
Lead Channel Setting Pacing Pulse Width: 0.8 ms
Lead Channel Setting Sensing Sensitivity: 0.9 mV
Zone Setting Status: 755011

## 2023-12-07 ENCOUNTER — Other Ambulatory Visit: Payer: Self-pay | Admitting: Internal Medicine

## 2023-12-11 DIAGNOSIS — D3131 Benign neoplasm of right choroid: Secondary | ICD-10-CM | POA: Diagnosis not present

## 2023-12-11 DIAGNOSIS — M3501 Sicca syndrome with keratoconjunctivitis: Secondary | ICD-10-CM | POA: Diagnosis not present

## 2023-12-11 DIAGNOSIS — H2513 Age-related nuclear cataract, bilateral: Secondary | ICD-10-CM | POA: Diagnosis not present

## 2023-12-11 LAB — OPHTHALMOLOGY REPORT-SCANNED

## 2024-01-11 DIAGNOSIS — H90A22 Sensorineural hearing loss, unilateral, left ear, with restricted hearing on the contralateral side: Secondary | ICD-10-CM | POA: Diagnosis not present

## 2024-01-13 ENCOUNTER — Other Ambulatory Visit: Payer: Self-pay | Admitting: Internal Medicine

## 2024-01-14 NOTE — Progress Notes (Signed)
 Remote PPM Transmission

## 2024-02-02 ENCOUNTER — Ambulatory Visit (INDEPENDENT_AMBULATORY_CARE_PROVIDER_SITE_OTHER): Admitting: Internal Medicine

## 2024-02-02 VITALS — BP 132/74 | HR 68 | Ht 62.0 in | Wt 161.8 lb

## 2024-02-02 DIAGNOSIS — E781 Pure hyperglyceridemia: Secondary | ICD-10-CM | POA: Diagnosis not present

## 2024-02-02 DIAGNOSIS — Z23 Encounter for immunization: Secondary | ICD-10-CM

## 2024-02-02 DIAGNOSIS — I5022 Chronic systolic (congestive) heart failure: Secondary | ICD-10-CM | POA: Diagnosis not present

## 2024-02-02 DIAGNOSIS — I428 Other cardiomyopathies: Secondary | ICD-10-CM | POA: Diagnosis not present

## 2024-02-02 DIAGNOSIS — H903 Sensorineural hearing loss, bilateral: Secondary | ICD-10-CM | POA: Diagnosis not present

## 2024-02-02 DIAGNOSIS — E1169 Type 2 diabetes mellitus with other specified complication: Secondary | ICD-10-CM

## 2024-02-02 DIAGNOSIS — F320 Major depressive disorder, single episode, mild: Secondary | ICD-10-CM | POA: Diagnosis not present

## 2024-02-02 DIAGNOSIS — I10 Essential (primary) hypertension: Secondary | ICD-10-CM

## 2024-02-02 MED ORDER — CARVEDILOL 25 MG PO TABS: 25.0000 mg | ORAL_TABLET | Freq: Two times a day (BID) | ORAL | 1 refills | Status: AC

## 2024-02-02 MED ORDER — AMLODIPINE BESYLATE 2.5 MG PO TABS: 2.5000 mg | ORAL_TABLET | Freq: Every day | ORAL | 3 refills | Status: AC

## 2024-02-02 MED ORDER — BUPROPION HCL ER (XL) 150 MG PO TB24: 150.0000 mg | ORAL_TABLET | Freq: Every day | ORAL | 1 refills | Status: AC

## 2024-02-02 NOTE — Patient Instructions (Addendum)
 I'm glad you are starting to recover   If your A1c is < 7.0 today, I'll see you in 6 months

## 2024-02-02 NOTE — Progress Notes (Addendum)
 Subjective:  Patient ID: Julia Pierce, female    DOB: 06/20/1955  Age: 68 y.o. MRN: [id number]  CC: The primary encounter diagnosis was Need for influenza vaccination. 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   HPI Julia Pierce presents for  Chief Complaint  Patient presents with   Medical Management of Chronic Issues    3 month follow up    1) T2DM:  She  feels generally well,  But  has not  resumed exercise or returned to swimming yet due to recurrent diarrhea secondary to  IBS .  Checking  blood sugars less than once daily at variable times, usually only if she feels she may be having a hypoglycemic event. .  BS have been under 130 fasting and < 150 post prandially.  Denies any recent hypoglyemic events.  Taking   medications as directed. Following a carbohydrate modified diet 5 days per week. Denies numbness, burning and tingling of extremities. Appetite is good.  Eye exam was done by porfilio.    2) insomnia:  trying natural remedies,  occasionally uses ibuprofen for joint pain brought on by prolonged yardwork .  Having more pain in the morning, hands hurt  the most,  has Dupuytrens contractures iolving the right palm , has been   injected twice .   3) History of fall one year ago,  none since.   Wearing tennis shoes most of the time   3) IBS/diarrhea aggravated by corn , artificial  sweeteners,  GMO foods  and anxiety/family conflict .  4) anxiety/grief :   complicated by husband's death in 04/07/24 .    I have good days and bad days taking care of things around the house.   Went to [place]  with son and wife who live in [place],  had an altercation with dtr in law. While at [employer] over management of an unruly  grandchild   . Feels that she has been alone most of her life, isolated emotionally from friends and family , but denies depression , not suicidal  Ut has coe to ter  Outpatient Medications Prior to Visit  Medication Sig Dispense Refill   ACCU-CHEK GUIDE TEST test strip USE TO CHECK BLOOD SUGARS 1 TIME DAILY. ACCU-CHEK TEST STRIPS 100 strip 12   acetaminophen  (TYLENOL ) 500 MG tablet Take 500-1,000 mg by mouth every 6 (six) hours as needed (pain).     Alpha-D-Galactosidase (BEANO PO) Take 1 tablet by mouth 3 (three) times daily before meals.     Azelaic Acid 15 % cream Apply 1 Application topically in the morning and at bedtime.     B Complex Vitamins (B COMPLEX PO) Take 1 tablet by mouth in the morning.     blood glucose meter kit and supplies Use to check blood sugars once daily daily. (FOR ICD-10 E11.9). 1 each 0   calcium  elemental as carbonate (BARIATRIC TUMS ULTRA) 400 MG chewable tablet Chew 400 mg by mouth in the morning.     Cholecalciferol (VITAMIN D3) 2000 UNITS TABS Take 2,000 Units by mouth in the morning. (Patient not taking: Reported on 06/03/2023)     diphenoxylate -atropine  (LOMOTIL ) 2.5-0.025 MG tablet TAKE 1 TABLET BY MOUTH UP TO 4 TIMES DAILY AS NEEDED FOR DIARRHEA OR LOOSE STOOLS. 30 tablet 2   Esomeprazole  Magnesium  20 MG  TBEC Take 20 mg by mouth daily before breakfast.     fluticasone (FLONASE) 50 MCG/ACT nasal spray Place 2 sprays into both nostrils 2 (two) times daily as needed for allergies.     losartan  (COZAAR ) 100 MG tablet TAKE 1 TABLET BY MOUTH EVERY EVENING. 90 tablet 1   Multiple Vitamin (MULTIVITAMIN) tablet Take 1 tablet by mouth in the morning.     Polyethyl Glycol-Propyl Glycol (SYSTANE ULTRA) 0.4-0.3 % SOLN Place 1-2 drops into both eyes 3 (three) times daily as needed (dry/irritated eyes.).     sertraline  (ZOLOFT ) 100 MG tablet TAKE 1 TABLET BY MOUTH EVERY DAY 90 tablet 1   tolterodine  (DETROL ) 2 MG tablet TAKE 1 TABLET BY MOUTH 2 TIMES DAILY. 180 tablet 2    amLODipine  (NORVASC ) 2.5 MG tablet TAKE 1 TABLET BY MOUTH EVERY DAY 90 tablet 3   buPROPion  (WELLBUTRIN  XL) 150 MG 24 hr tablet Take 1 tablet (150 mg total) by mouth daily. 90 tablet 1   carvedilol  (COREG ) 25 MG tablet TAKE 1 TABLET BY MOUTH TWICE A DAY 60 tablet 0   Facility-Administered Medications Prior to Visit  Medication Dose Route Frequency Provider Last Rate Last Admin   diclofenac  sodium (VOLTAREN ) 1 % transdermal gel 4 g  4 g Topical QID Dannial Hacker, MD       midazolam  (VERSED ) 5 MG/5ML injection 5 mg  5 mg Intravenous Once Dannial Hacker, MD       orphenadrine  (NORFLEX ) injection 60 mg  60 mg Intramuscular Once Dannial Hacker, MD       sodium chloride  0.9 % injection 20 mL  20 mL Other Once Dannial Hacker, MD       triamcinolone  acetonide (KENALOG -40) injection 40 mg  40 mg Other Once Dannial Hacker, MD        Review of Systems;  Patient denies headache, fevers, malaise, unintentional weight loss, skin rash, eye pain, sinus congestion and sinus pain, sore throat, dysphagia,  hemoptysis , cough, dyspnea, wheezing, chest pain, palpitations, orthopnea, edema, abdominal pain, nausea, melena, diarrhea, constipation, flank pain, dysuria, hematuria, urinary  Frequency, nocturia, numbness, tingling, seizures,  Focal weakness, Loss of consciousness,  Tremor ,   Objective:  BP 132/74   Pulse 68   Ht 5' 2 (1.575 m)   Wt 161 lb 12.8 oz (73.4 kg)   SpO2 98%   BMI 29.59 kg/m   BP Readings from Last 3 Encounters:  02/02/24 132/74  10/30/23 124/72  10/27/22 138/80    Wt Readings from Last 3 Encounters:  02/02/24 161 lb 12.8 oz (73.4 kg)  10/30/23 164 lb (74.4 kg)  06/03/23 153 lb (69.4 kg)    Physical Exam Vitals reviewed.  Constitutional:      General: She is not in acute distress.    Appearance: Normal appearance. She is normal weight. She is not ill-appearing, toxic-appearing or diaphoretic.  HENT:     Head: Normocephalic.  Eyes:     General: No scleral icterus.        Right eye: No discharge.        Left eye: No discharge.     Conjunctiva/sclera: Conjunctivae normal.  Cardiovascular:     Rate and Rhythm: Normal rate and regular rhythm.     Heart sounds: Normal heart sounds.  Pulmonary:     Effort: Pulmonary effort is normal. No respiratory distress.     Breath sounds: Normal breath sounds.  Musculoskeletal:        General: Normal range of motion.  Skin:  General: Skin is warm and dry.  Neurological:     General: No focal deficit present.     Mental Status: She is alert and oriented to person, place, and time. Mental status is at baseline.  Psychiatric:        Mood and Affect: Mood normal.        Behavior: Behavior normal.        Thought Content: Thought content normal.        Judgment: Judgment normal.     Lab Results  Component Value Date   HGBA1C 6.5 02/03/2024   HGBA1C 6.6 (H) 10/30/2023   HGBA1C 6.4 04/08/2023    Lab Results  Component Value Date   CREATININE 0.76 02/03/2024   CREATININE 0.68 10/30/2023   CREATININE 0.78 04/08/2023    Lab Results  Component Value Date   WBC 6.7 10/30/2023   HGB 12.7 10/30/2023   HCT 37.6 10/30/2023   PLT 182.0 10/30/2023   GLUCOSE 158 (H) 02/03/2024   CHOL 226 (H) 02/03/2024   TRIG 312.0 (H) 02/03/2024   HDL 39.90 02/03/2024   LDLDIRECT 143.0 02/03/2024   LDLCALC 124 (H) 02/03/2024   ALT 12 02/03/2024   AST 17 02/03/2024   NA 140 02/03/2024   K 4.1 02/03/2024   CL 104 02/03/2024   CREATININE 0.76 02/03/2024   BUN 15 02/03/2024   CO2 30 02/03/2024   TSH 3.46 10/30/2023   HGBA1C 6.5 02/03/2024   MICROALBUR 1.5 10/30/2023    EP PPM/ICD IMPLANT Result Date: 06/02/2022 Conclusion: Successful removal of a previously implanted biventricular pacemaker, and insertion of a new biventricular pacemaker in a patient with complete heart block and chronic systolic heart failure. 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 However  triglycerides have risen  and repeat are pending.. Patient has no microalbuminuria. Patient is tolerating ACE/ARB for renal protection and hypertension .  She has deferred jardiance  and CBG. Monitor.  She is  fenofibrate and statin intolerant.  Will recommend exercise and if no improvement,  a trial of Vascepa  . Patient is reminded to schedule an annual eye exam and foot exam is up to date   Lab Results  Component Value Date   HGBA1C 6.6 (H) 10/30/2023   Lab Results  Component Value Date   LABMICR 9.4 06/27/2022   MICROALBUR 1.5 10/30/2023   Lab Results  Component Value Date   CHOL 228 (H) 10/30/2023   HDL 41.00 10/30/2023   LDLCALC 121 (H) 10/30/2023   LDLDIRECT 149.0 04/08/2023   TRIG 327.0 (H) 10/30/2023   CHOLHDL 6 10/30/2023      Orders: -     Comprehensive metabolic panel with GFR; Future -     Hemoglobin A1c; Future -     Lipid panel; Future  Hypertriglyceridemia -     LDL cholesterol, direct; Future  Sensorineural hearing loss (SNHL) of both ears Assessment & Plan: Hearing test has been rescheduled and she will likely obtain hearing aids   Chronic systolic heart failure [facility]) Assessment & Plan: Managed medically..  she has no edema on exam  and BP is at goal. .  EF has normalized by most recent  ECHO weight is stable.  Continue carvedilol , losartan .  avoid salt.  Encouraged to resume regular exercise      Depression, major,  single episode, mild Assessment & Plan: Aggravated by grief over losing husband.  Symptoms under control with wellbutrin  and sertraline . Since her husband died in 2024-04-01,  the additional stressor of marital  conflict has resolved,  but the tension between her in law family and her daughter in law continue to be an issue for her     Primary hypertension Assessment & Plan: Now at goal   on current regimen of amlodipine  2.5 mg ,  carvedilol  25 mg bid,  And losartan  100 mg daily  Renal function stable, no changes today.  Lab Results  Component Value Date   CREATININE 0.68 10/30/2023   Lab Results  Component Value Date   NA 139 10/30/2023   K 4.2 10/30/2023   CL 103 10/30/2023   CO2 29 10/30/2023      Non-ischemic cardiomyopathy (HCC) Assessment & Plan: with normal coronaries by cath 2015  and now with normal EF of 50% per dec cardiology follow up    Other orders -     amLODIPine  Besylate; Take 1 tablet (2.5 mg total) by mouth daily.  Dispense: 90 tablet; Refill: 3 -     buPROPion  HCl ER (XL); Take 1 tablet (150 mg total) by mouth daily.  Dispense: 90 tablet; Refill: 1 -     Carvedilol ; Take 1 tablet (25 mg total) by mouth 2 (two) times daily.  Dispense: 180 tablet; Refill: 1   I   Follow-up: No follow-ups on file.   Julia LITTIE Kettering, MD

## 2024-02-03 LAB — COMPREHENSIVE METABOLIC PANEL WITH GFR
ALT: 12 U/L (ref 0–35)
AST: 17 U/L (ref 0–37)
Albumin: 4 g/dL (ref 3.5–5.2)
Alkaline Phosphatase: 91 U/L (ref 39–117)
BUN: 15 mg/dL (ref 6–23)
CO2: 30 meq/L (ref 19–32)
Calcium: 9.1 mg/dL (ref 8.4–10.5)
Chloride: 104 meq/L (ref 96–112)
Creatinine, Ser: 0.76 mg/dL (ref 0.40–1.20)
GFR: 79.89 mL/min (ref >60.00)
Glucose, Bld: 158 mg/dL — ABNORMAL HIGH (ref 70–99)
Potassium: 4.1 meq/L (ref 3.5–5.1)
Sodium: 140 meq/L (ref 135–145)
Total Bilirubin: 0.4 mg/dL (ref 0.2–1.2)
Total Protein: 6.8 g/dL (ref 6.0–8.3)

## 2024-02-03 LAB — LIPID PANEL
Cholesterol: 226 mg/dL — ABNORMAL HIGH (ref 0–200)
HDL: 39.9 mg/dL (ref >39.00)
LDL Cholesterol: 124 mg/dL — ABNORMAL HIGH (ref 0–99)
NonHDL: 186.21
Total CHOL/HDL Ratio: 6
Triglycerides: 312 mg/dL — ABNORMAL HIGH (ref 0.0–149.0)
VLDL: 62.4 mg/dL — ABNORMAL HIGH (ref 0.0–40.0)

## 2024-02-03 LAB — LDL CHOLESTEROL, DIRECT: Direct LDL: 143 mg/dL

## 2024-02-03 LAB — HEMOGLOBIN A1C: Hgb A1c MFr Bld: 6.5 % (ref 4.6–6.5)

## 2024-02-03 NOTE — Assessment & Plan Note (Signed)
 Now at goal  on current regimen of amlodipine  2.5 mg ,  carvedilol  25 mg bid,  And losartan  100 mg daily  Renal function stable, no changes today.  Lab Results  Component Value Date   CREATININE 0.68 10/30/2023   Lab Results  Component Value Date   NA 139 10/30/2023   K 4.2 10/30/2023   CL 103 10/30/2023   CO2 29 10/30/2023

## 2024-02-03 NOTE — Assessment & Plan Note (Signed)
 Managed medically..  she has no edema on exam  and BP is at goal. .  EF has normalized by most recent  ECHO weight is stable.  Continue carvedilol , losartan .  avoid salt.  Encouraged to resume regular exercise

## 2024-02-03 NOTE — Assessment & Plan Note (Addendum)
 Aggravated by grief over losing husband.  Symptoms under control with wellbutrin  and sertraline . Since her husband died in 03/21/2024,  the additional stressor of marital  conflict has resolved,  but the tension between her in law family and her daughter in law continue to be an issue for her

## 2024-02-03 NOTE — Assessment & Plan Note (Signed)
 Hearing test has been rescheduled and she will likely obtain hearing aids

## 2024-02-03 NOTE — Addendum Note (Signed)
 Addended by: MARYLEN PRO A on: 02/03/2024 11:11 AM   Modules accepted: Orders

## 2024-02-03 NOTE — Assessment & Plan Note (Addendum)
 She has maintained control without anti hyperglycemics. However  triglycerides have risen  and repeat are pending.. Patient has no microalbuminuria. Patient is tolerating ACE/ARB for renal protection and hypertension .  She has deferred jardiance  and CBG. Monitor.  She is  fenofibrate and statin intolerant.  Will recommend exercise and if no improvement,  a trial of Vascepa  . Patient is reminded to schedule an annual eye exam and foot exam is up to date   Lab Results  Component Value Date   HGBA1C 6.6 (H) 10/30/2023   Lab Results  Component Value Date   LABMICR 9.4 06/27/2022   MICROALBUR 1.5 10/30/2023   Lab Results  Component Value Date   CHOL 228 (H) 10/30/2023   HDL 41.00 10/30/2023   LDLCALC 121 (H) 10/30/2023   LDLDIRECT 149.0 04/08/2023   TRIG 327.0 (H) 10/30/2023   CHOLHDL 6 10/30/2023

## 2024-02-05 ENCOUNTER — Ambulatory Visit: Payer: Self-pay | Admitting: Internal Medicine

## 2024-02-05 NOTE — Assessment & Plan Note (Signed)
 with normal coronaries by cath 2015  and now with normal EF of 50% per dec cardiology follow up

## 2024-02-29 DIAGNOSIS — H903 Sensorineural hearing loss, bilateral: Secondary | ICD-10-CM | POA: Diagnosis not present

## 2024-03-02 ENCOUNTER — Ambulatory Visit: Payer: PPO

## 2024-03-02 DIAGNOSIS — I428 Other cardiomyopathies: Secondary | ICD-10-CM | POA: Diagnosis not present

## 2024-03-03 LAB — CUP PACEART REMOTE DEVICE CHECK
Battery Remaining Longevity: 89 mo
Battery Voltage: 3 V
Brady Statistic AP VP Percent: 0.09 %
Brady Statistic AP VS Percent: 0.01 %
Brady Statistic AS VP Percent: 99.9 %
Brady Statistic AS VS Percent: 0 %
Brady Statistic RA Percent Paced: 0.09 %
Brady Statistic RV Percent Paced: 99.99 %
Date Time Interrogation Session: 20251111213707
Implantable Lead Connection Status: 753985
Implantable Lead Connection Status: 753985
Implantable Lead Connection Status: 753985
Implantable Lead Implant Date: 20150212
Implantable Lead Implant Date: 20150212
Implantable Lead Implant Date: 20150212
Implantable Lead Location: 753858
Implantable Lead Location: 753859
Implantable Lead Location: 753860
Implantable Lead Model: 4194
Implantable Lead Model: 5076
Implantable Lead Model: 5076
Implantable Pulse Generator Implant Date: 20240212
Lead Channel Impedance Value: 285 Ohm
Lead Channel Impedance Value: 361 Ohm
Lead Channel Impedance Value: 380 Ohm
Lead Channel Impedance Value: 380 Ohm
Lead Channel Impedance Value: 437 Ohm
Lead Channel Impedance Value: 513 Ohm
Lead Channel Impedance Value: 551 Ohm
Lead Channel Impedance Value: 551 Ohm
Lead Channel Impedance Value: 646 Ohm
Lead Channel Pacing Threshold Amplitude: 0.75 V
Lead Channel Pacing Threshold Amplitude: 1 V
Lead Channel Pacing Threshold Amplitude: 1.125 V
Lead Channel Pacing Threshold Pulse Width: 0.4 ms
Lead Channel Pacing Threshold Pulse Width: 0.4 ms
Lead Channel Pacing Threshold Pulse Width: 0.8 ms
Lead Channel Sensing Intrinsic Amplitude: 1.625 mV
Lead Channel Sensing Intrinsic Amplitude: 1.625 mV
Lead Channel Sensing Intrinsic Amplitude: 19 mV
Lead Channel Sensing Intrinsic Amplitude: 19 mV
Lead Channel Setting Pacing Amplitude: 1.5 V
Lead Channel Setting Pacing Amplitude: 2 V
Lead Channel Setting Pacing Amplitude: 2.5 V
Lead Channel Setting Pacing Pulse Width: 0.4 ms
Lead Channel Setting Pacing Pulse Width: 0.8 ms
Lead Channel Setting Sensing Sensitivity: 0.9 mV
Zone Setting Status: 755011

## 2024-03-06 ENCOUNTER — Ambulatory Visit: Payer: Self-pay | Admitting: Internal Medicine

## 2024-03-07 NOTE — Progress Notes (Signed)
 Remote PPM Transmission

## 2024-03-18 ENCOUNTER — Other Ambulatory Visit: Payer: Self-pay | Admitting: Internal Medicine

## 2024-04-12 ENCOUNTER — Encounter: Payer: Self-pay | Admitting: Pharmacist

## 2024-04-12 NOTE — Progress Notes (Signed)
 Pharmacy Quality Measure Review  This patient is appearing on a report for being at risk of failing the adherence measure for hypertension (ACEi/ARB) medications this calendar year.   Medication: losartan  100 mg Last fill date: 8/25 for 90 day supply  Insurance report was not up to date. No action needed at this time.  Medication has been refilled as of 12/1 x90ds.

## 2024-05-05 ENCOUNTER — Ambulatory Visit: Admitting: Internal Medicine

## 2024-06-07 ENCOUNTER — Ambulatory Visit: Payer: PPO

## 2024-08-04 ENCOUNTER — Ambulatory Visit: Admitting: Internal Medicine

## 2024-08-31 ENCOUNTER — Ambulatory Visit

## 2024-09-02 ENCOUNTER — Ambulatory Visit: Admitting: Cardiology

## 2024-11-30 ENCOUNTER — Ambulatory Visit

## 2025-03-01 ENCOUNTER — Ambulatory Visit

## 2025-05-31 ENCOUNTER — Ambulatory Visit

## 2025-08-30 ENCOUNTER — Ambulatory Visit

## 2025-11-29 ENCOUNTER — Ambulatory Visit

## 2026-02-28 ENCOUNTER — Ambulatory Visit

## 2026-05-30 ENCOUNTER — Ambulatory Visit
